# Patient Record
Sex: Female | Born: 1935 | Race: White | Hispanic: No | Smoking: Never smoker
Health system: Southern US, Community
[De-identification: ages and names within clinical notes are randomized; demographics above are authoritative.]

## PROBLEM LIST (undated history)

## (undated) DIAGNOSIS — K219 Gastro-esophageal reflux disease without esophagitis: Secondary | ICD-10-CM

## (undated) DIAGNOSIS — I2510 Atherosclerotic heart disease of native coronary artery without angina pectoris: Secondary | ICD-10-CM

## (undated) DIAGNOSIS — M4800 Spinal stenosis, site unspecified: Secondary | ICD-10-CM

## (undated) DIAGNOSIS — D649 Anemia, unspecified: Secondary | ICD-10-CM

## (undated) DIAGNOSIS — E7800 Pure hypercholesterolemia, unspecified: Secondary | ICD-10-CM

## (undated) DIAGNOSIS — E785 Hyperlipidemia, unspecified: Secondary | ICD-10-CM

## (undated) DIAGNOSIS — M1990 Unspecified osteoarthritis, unspecified site: Secondary | ICD-10-CM

## (undated) DIAGNOSIS — I10 Essential (primary) hypertension: Secondary | ICD-10-CM

## (undated) HISTORY — PX: CAROTID STENT: SHX1301

## (undated) HISTORY — PX: APPENDECTOMY: SHX54

## (undated) HISTORY — PX: ABDOMINAL HYSTERECTOMY: SHX81

## (undated) HISTORY — PX: BACK SURGERY: SHX140

## (undated) HISTORY — PX: UPPER GI ENDOSCOPY: SHX6162

---

## 1998-04-28 ENCOUNTER — Other Ambulatory Visit: Admission: RE | Admit: 1998-04-28 | Discharge: 1998-04-28 | Payer: Self-pay | Admitting: Family Medicine

## 1999-10-20 ENCOUNTER — Encounter: Admission: RE | Admit: 1999-10-20 | Discharge: 1999-11-12 | Payer: Self-pay | Admitting: *Deleted

## 2000-02-15 ENCOUNTER — Encounter: Admission: RE | Admit: 2000-02-15 | Discharge: 2000-02-15 | Payer: Self-pay | Admitting: *Deleted

## 2000-02-15 ENCOUNTER — Encounter: Payer: Self-pay | Admitting: Family Medicine

## 2000-03-22 ENCOUNTER — Ambulatory Visit (HOSPITAL_COMMUNITY): Admission: RE | Admit: 2000-03-22 | Discharge: 2000-03-22 | Payer: Self-pay | Admitting: Neurosurgery

## 2000-03-22 ENCOUNTER — Encounter: Payer: Self-pay | Admitting: Neurosurgery

## 2000-06-17 ENCOUNTER — Ambulatory Visit (HOSPITAL_COMMUNITY): Admission: RE | Admit: 2000-06-17 | Discharge: 2000-06-17 | Payer: Self-pay | Admitting: Cardiology

## 2000-07-01 ENCOUNTER — Encounter: Payer: Self-pay | Admitting: Gastroenterology

## 2000-07-01 ENCOUNTER — Ambulatory Visit (HOSPITAL_COMMUNITY): Admission: RE | Admit: 2000-07-01 | Discharge: 2000-07-01 | Payer: Self-pay | Admitting: Gastroenterology

## 2000-08-11 ENCOUNTER — Other Ambulatory Visit: Admission: RE | Admit: 2000-08-11 | Discharge: 2000-08-11 | Payer: Self-pay | Admitting: Family Medicine

## 2000-08-29 ENCOUNTER — Encounter: Payer: Self-pay | Admitting: Gastroenterology

## 2000-08-29 ENCOUNTER — Encounter: Admission: RE | Admit: 2000-08-29 | Discharge: 2000-08-29 | Payer: Self-pay | Admitting: Gastroenterology

## 2000-09-13 ENCOUNTER — Encounter (HOSPITAL_COMMUNITY): Admission: RE | Admit: 2000-09-13 | Discharge: 2000-12-12 | Payer: Self-pay | Admitting: Cardiology

## 2000-09-22 ENCOUNTER — Encounter: Payer: Self-pay | Admitting: Gastroenterology

## 2000-09-22 ENCOUNTER — Ambulatory Visit (HOSPITAL_COMMUNITY): Admission: RE | Admit: 2000-09-22 | Discharge: 2000-09-22 | Payer: Self-pay | Admitting: Gastroenterology

## 2000-11-04 ENCOUNTER — Encounter: Payer: Self-pay | Admitting: Gastroenterology

## 2000-11-04 ENCOUNTER — Encounter: Admission: RE | Admit: 2000-11-04 | Discharge: 2000-11-04 | Payer: Self-pay | Admitting: Gastroenterology

## 2000-12-13 ENCOUNTER — Encounter (HOSPITAL_COMMUNITY): Admission: RE | Admit: 2000-12-13 | Discharge: 2001-03-13 | Payer: Self-pay | Admitting: Cardiology

## 2001-02-15 ENCOUNTER — Encounter: Payer: Self-pay | Admitting: Family Medicine

## 2001-02-15 ENCOUNTER — Encounter: Admission: RE | Admit: 2001-02-15 | Discharge: 2001-02-15 | Payer: Self-pay | Admitting: Family Medicine

## 2002-02-19 ENCOUNTER — Encounter: Payer: Self-pay | Admitting: Family Medicine

## 2002-02-19 ENCOUNTER — Encounter: Admission: RE | Admit: 2002-02-19 | Discharge: 2002-02-19 | Payer: Self-pay | Admitting: Family Medicine

## 2002-08-24 ENCOUNTER — Encounter: Admission: RE | Admit: 2002-08-24 | Discharge: 2002-08-24 | Payer: Self-pay | Admitting: Family Medicine

## 2002-08-24 ENCOUNTER — Encounter: Payer: Self-pay | Admitting: Family Medicine

## 2002-12-10 ENCOUNTER — Encounter: Admission: RE | Admit: 2002-12-10 | Discharge: 2002-12-10 | Payer: Self-pay | Admitting: Cardiology

## 2002-12-10 ENCOUNTER — Encounter: Payer: Self-pay | Admitting: Cardiology

## 2003-04-05 ENCOUNTER — Encounter: Payer: Self-pay | Admitting: Family Medicine

## 2003-04-05 ENCOUNTER — Encounter: Admission: RE | Admit: 2003-04-05 | Discharge: 2003-04-05 | Payer: Self-pay | Admitting: Family Medicine

## 2003-04-22 ENCOUNTER — Encounter: Admission: RE | Admit: 2003-04-22 | Discharge: 2003-04-22 | Payer: Self-pay | Admitting: Family Medicine

## 2003-04-22 ENCOUNTER — Encounter: Payer: Self-pay | Admitting: Family Medicine

## 2003-07-25 ENCOUNTER — Ambulatory Visit (HOSPITAL_COMMUNITY): Admission: RE | Admit: 2003-07-25 | Discharge: 2003-07-25 | Payer: Self-pay | Admitting: Neurosurgery

## 2003-07-25 ENCOUNTER — Encounter: Payer: Self-pay | Admitting: Neurosurgery

## 2003-08-27 ENCOUNTER — Encounter: Payer: Self-pay | Admitting: Family Medicine

## 2003-08-27 ENCOUNTER — Encounter: Admission: RE | Admit: 2003-08-27 | Discharge: 2003-08-27 | Payer: Self-pay | Admitting: Family Medicine

## 2004-09-09 ENCOUNTER — Encounter: Admission: RE | Admit: 2004-09-09 | Discharge: 2004-09-09 | Payer: Self-pay | Admitting: Family Medicine

## 2005-10-01 ENCOUNTER — Encounter: Admission: RE | Admit: 2005-10-01 | Discharge: 2005-10-01 | Payer: Self-pay | Admitting: Family Medicine

## 2005-10-11 ENCOUNTER — Encounter: Admission: RE | Admit: 2005-10-11 | Discharge: 2005-10-11 | Payer: Self-pay | Admitting: Family Medicine

## 2006-04-18 ENCOUNTER — Encounter: Admission: RE | Admit: 2006-04-18 | Discharge: 2006-04-18 | Payer: Self-pay | Admitting: Family Medicine

## 2006-06-23 ENCOUNTER — Ambulatory Visit (HOSPITAL_COMMUNITY): Admission: RE | Admit: 2006-06-23 | Discharge: 2006-06-23 | Payer: Self-pay | Admitting: Neurosurgery

## 2006-06-29 ENCOUNTER — Ambulatory Visit: Admission: RE | Admit: 2006-06-29 | Discharge: 2006-06-29 | Payer: Self-pay | Admitting: Neurosurgery

## 2006-06-29 ENCOUNTER — Encounter: Payer: Self-pay | Admitting: Vascular Surgery

## 2006-08-24 ENCOUNTER — Ambulatory Visit (HOSPITAL_COMMUNITY): Admission: RE | Admit: 2006-08-24 | Discharge: 2006-08-24 | Payer: Self-pay | Admitting: Family Medicine

## 2006-09-09 ENCOUNTER — Inpatient Hospital Stay (HOSPITAL_COMMUNITY): Admission: EM | Admit: 2006-09-09 | Discharge: 2006-09-14 | Payer: Self-pay | Admitting: Emergency Medicine

## 2006-09-21 ENCOUNTER — Ambulatory Visit (HOSPITAL_COMMUNITY): Admission: RE | Admit: 2006-09-21 | Discharge: 2006-09-21 | Payer: Self-pay | Admitting: Gastroenterology

## 2006-09-29 ENCOUNTER — Encounter (HOSPITAL_COMMUNITY): Admission: RE | Admit: 2006-09-29 | Discharge: 2006-12-28 | Payer: Self-pay | Admitting: Cardiology

## 2006-09-30 ENCOUNTER — Encounter: Admission: RE | Admit: 2006-09-30 | Discharge: 2006-09-30 | Payer: Self-pay | Admitting: Gastroenterology

## 2006-10-14 ENCOUNTER — Encounter: Admission: RE | Admit: 2006-10-14 | Discharge: 2006-10-14 | Payer: Self-pay | Admitting: Family Medicine

## 2006-12-29 ENCOUNTER — Encounter (HOSPITAL_COMMUNITY): Admission: RE | Admit: 2006-12-29 | Discharge: 2007-01-15 | Payer: Self-pay | Admitting: Cardiology

## 2007-01-17 ENCOUNTER — Encounter (HOSPITAL_COMMUNITY): Admission: RE | Admit: 2007-01-17 | Discharge: 2007-04-17 | Payer: Self-pay | Admitting: Cardiology

## 2007-03-27 ENCOUNTER — Ambulatory Visit (HOSPITAL_COMMUNITY): Admission: RE | Admit: 2007-03-27 | Discharge: 2007-03-28 | Payer: Self-pay | Admitting: Cardiology

## 2007-04-26 ENCOUNTER — Observation Stay (HOSPITAL_COMMUNITY): Admission: EM | Admit: 2007-04-26 | Discharge: 2007-04-27 | Payer: Self-pay | Admitting: Emergency Medicine

## 2007-05-30 ENCOUNTER — Encounter: Admission: RE | Admit: 2007-05-30 | Discharge: 2007-05-30 | Payer: Self-pay | Admitting: Family Medicine

## 2007-09-05 ENCOUNTER — Encounter: Admission: RE | Admit: 2007-09-05 | Discharge: 2007-09-05 | Payer: Self-pay | Admitting: Neurosurgery

## 2007-09-12 ENCOUNTER — Inpatient Hospital Stay (HOSPITAL_COMMUNITY): Admission: EM | Admit: 2007-09-12 | Discharge: 2007-09-13 | Payer: Self-pay | Admitting: Emergency Medicine

## 2007-10-16 ENCOUNTER — Encounter: Admission: RE | Admit: 2007-10-16 | Discharge: 2007-10-16 | Payer: Self-pay | Admitting: Family Medicine

## 2008-07-04 ENCOUNTER — Encounter: Admission: RE | Admit: 2008-07-04 | Discharge: 2008-07-04 | Payer: Self-pay | Admitting: Family Medicine

## 2008-07-19 ENCOUNTER — Ambulatory Visit (HOSPITAL_COMMUNITY): Admission: RE | Admit: 2008-07-19 | Discharge: 2008-07-19 | Payer: Self-pay | Admitting: Gastroenterology

## 2008-10-16 ENCOUNTER — Encounter: Admission: RE | Admit: 2008-10-16 | Discharge: 2008-10-16 | Payer: Self-pay | Admitting: Neurosurgery

## 2008-12-09 ENCOUNTER — Encounter: Admission: RE | Admit: 2008-12-09 | Discharge: 2008-12-09 | Payer: Self-pay | Admitting: Family Medicine

## 2009-10-27 ENCOUNTER — Encounter: Admission: RE | Admit: 2009-10-27 | Discharge: 2009-10-27 | Payer: Self-pay | Admitting: Neurosurgery

## 2010-01-21 ENCOUNTER — Encounter
Admission: RE | Admit: 2010-01-21 | Discharge: 2010-01-21 | Payer: Self-pay | Source: Home / Self Care | Admitting: Family Medicine

## 2010-11-30 ENCOUNTER — Ambulatory Visit (HOSPITAL_COMMUNITY)
Admission: RE | Admit: 2010-11-30 | Discharge: 2010-11-30 | Payer: Self-pay | Source: Home / Self Care | Attending: Gastroenterology | Admitting: Gastroenterology

## 2010-12-10 ENCOUNTER — Encounter
Admission: RE | Admit: 2010-12-10 | Discharge: 2010-12-10 | Payer: Self-pay | Source: Home / Self Care | Attending: Gastroenterology | Admitting: Gastroenterology

## 2011-01-14 ENCOUNTER — Other Ambulatory Visit: Payer: Self-pay | Admitting: Family Medicine

## 2011-01-14 DIAGNOSIS — Z1239 Encounter for other screening for malignant neoplasm of breast: Secondary | ICD-10-CM

## 2011-02-08 ENCOUNTER — Ambulatory Visit: Payer: Self-pay

## 2011-02-10 ENCOUNTER — Ambulatory Visit: Payer: Self-pay

## 2011-02-17 ENCOUNTER — Ambulatory Visit
Admission: RE | Admit: 2011-02-17 | Discharge: 2011-02-17 | Disposition: A | Discharge status: Home / Self Care | Payer: Medicare Other | Source: Ambulatory Visit | Attending: Family Medicine | Admitting: Family Medicine

## 2011-02-17 DIAGNOSIS — Z1239 Encounter for other screening for malignant neoplasm of breast: Secondary | ICD-10-CM

## 2011-04-27 NOTE — Cardiovascular Report (Signed)
Molly, [name]          ACCOUNT NO.:  [id number]   MEDICAL RECORD NO.:  [id number]          PATIENT TYPE:  INP   LOCATION:  6529                         FACILITY:  [facility]   PHYSICIAN:  Francisca December, M.D.  DATE OF BIRTH:  1929-08-18   DATE OF PROCEDURE:  09/12/2007  DATE OF DISCHARGE:                            CARDIAC CATHETERIZATION   PROCEDURES PERFORMED:  1. Left heart catheterization.  2. Coronary angiography.  3. Left ventriculogram.   INDICATIONS:  The patient is a 76 year old woman who underwent a stent  implantation in the ostium of the right coronary October 2007.  In April  2008, a relook showed the stent to be widely patent.  She came to  emergency room this morning with classic symptoms of unstable angina  with substernal chest pain radiating up into the jaw.  It was finally  relieved with subcutaneous Lovenox and IV nitroglycerin.  She is brought  to catheterization laboratory at this time to identify the extent of  disease and provide her further therapeutic options.  Goals, risks and  alternatives of the procedure were discussed with the patient.  The  patient states her understanding, has had her questions answered, and  wishes to proceed.   PROCEDURE NOTE:  The patient is brought to cardiac catheterization  laboratory in the fasting state.  The right groin was prepped and draped  in the usual sterile fashion.  Local anesthesia was obtained with  infiltration of 1% lidocaine.  A 6-French catheter sheath was inserted  percutaneously into the right femoral artery utilizing an anterior  approach over a guiding J-wire.  Left heart catheterization and coronary  angiography then proceeded in the standard fashion using 6-French #4  left and right Judkins catheters and a 110 cm pigtail catheter.  A 30  degree RAO cine left ventriculogram was performed utilizing power  injector,  36 mL were injected at 12 mL per second.  All catheter  manipulations were  performed using fluoroscopic observation and  exchanges performed over a long guiding J-wire.  At the completion of  procedure, the catheter and catheter sheaths were removed.  Hemostasis  was achieved.   Hemodynamics: Systemic arterial pressure was 154/64 with a mean of 90  mmHg.  There was no systolic gradient across the aortic valve.  Left  ventricular end-diastolic pressure was 9 mmHg preventriculogram.   ANGIOGRAPHY:  The left ventriculogram demonstrated normal chamber size  and normal global systolic function without regional wall motion  abnormality.  A visual estimate of the ejection fraction is 70%.  There  is no mitral vegetation.  There is a trileaflet aortic valve that opens  normally during systole.  Minimal left coronary calcification is seen  and the stent in the proximal portion of right coronary is easily  visible.   There is a right-dominant coronary system present.  The left main  coronary is normal.   The left anterior descending artery and its branches show some luminal  irregularities but are without any significant obstruction.  A single  large diagonal branch arises which is free of obstruction.  The anterior  descending artery reaches and traverses  the apex.   The left circumflex coronary artery and its branches also are widely  patent.  There is a proximal 20% narrowing after the origin of the ramus  intermedius.  There is no obstruction in the ramus.  Two small marginal  branches arise and the obtuse marginal branch is a dominant vessel.  No  obstructions are seen in these branch arteries.   The right coronary artery is widely patent in the mid and distal  segments as well as the posterior descending artery in the  posterolateral segment and including the left ventricular branch.  The  stent in the proximal portion of the artery is easily visible and  appears to be widely patent.  There is perhaps a 30% degree of in-stent  restenosis.  Again, this is  right at the ostium of the artery.   FINAL IMPRESSION:  1. Atherosclerotic coronary vascular disease, single vessel.  2. Minimal in-stent restenosis ostial right coronary stented segment,      the degree of obstruction not greater than 30%.  3. Intact left ventricular size and global systolic function, ejection      fraction 70%.      Francisca December, M.D.  Electronically Signed     JHE/MEDQ  D:  09/12/2007  T:  09/13/2007  Job:  [id number]   cc:   Hal T. Stoneking, M.D.

## 2011-04-27 NOTE — H&P (Signed)
NAMEJAZMARIE, Molly [name]          ACCOUNT NO.:  [id number]   MEDICAL RECORD NO.:  [id number]          PATIENT TYPE:  OBV   LOCATION:  0102                         FACILITY:  [facility]   PHYSICIAN:  Corinna L. Lendell Caprice, MDDATE OF BIRTH:  08-28-1929   DATE OF ADMISSION:  04/26/2007  DATE OF DISCHARGE:                              HISTORY & PHYSICAL   CHIEF COMPLAINT:  Fall.   HISTORY OF PRESENT ILLNESS:  Molly [name] is a pleasant 77 year old  white female patient of Dr. Consuella Lose Griffin's who was brought to the  emergency room by family members after having fallen and hitting her  left face.  She does not recall the episode.  It was approximately 5:00  a.m.; it was dark; and she reportedly was getting out of bed and  subsequently hit her head on a night stand or chest of drawers.  She  also was noted to have a right wrist hematoma and ecchymoses over her  left flank.  Her husband heard a thump, and evaluated her you  immediately.  He reports that she was alert, but that she seemed a  little bit confused.  This quickly resolved.  She had a lot of bleeding  from the left brow area; and it has been sutured by the ED staff.  She  had a CT of the brain which was negative.  She also had IV morphine.  After getting the morphine she experienced a choking sensation and chest  pain which resolved quickly.  She subsequently got Benadryl, but the  sensation had resolved prior to receiving the Benadryl.  She had  sensation of the bed and the room tilting as well as dizziness and  difficulty walking after receiving morphine.  The ED physician was  concerned about a central neurologic event; and I was, therefore,  consulted to evaluate for admission.  The patient reports no history of  stroke or seizure her husband reports that her mental status is back to  baseline.  The patient reports some pain around the left brow.  There  was no reported facial droop, dysarthria, etcetera.   PAST MEDICAL  HISTORY:  1. Coronary artery disease with history of bare metal stent  of the      right coronary in October of 2007.  2. History of anemia.  3. History of gastroesophageal reflux disease with esophagitis.  4. Hyperlipidemia.  5. History of appendectomy.  6. History of partial hysterectomy.  7. room History of chronic low back pain and disk degenerative disk      disease.   MEDICATIONS:  1. Aspirin 325 mg a day.  2. Calcium 500 mg a day.  3. Stool softener and laxative daily.  4. Crestor 10 mg a day.  5. Nexium 40 mg a day.  6. Omnicor 1 gram p.o. b.i.d.  7. Voltaren 75 mg a day.  8. Zetia 10 mg a day.  9. Imdur  30 mg a day.  10.Glucosamine daily.   ALLERGIES:  She is allergic to CODEINE and SULFA.   SOCIAL HISTORY:  She is married, here with her husband and daughter.  She has no drinking  of smoking history.   FAMILY HISTORY:  Her brother died of a heart attack at age 56.  Her  sisters also had coronary artery disease, stroke and diabetes.  Her  mother had aplastic anemia.   PAST SURGICAL HISTORY:  As above.   REVIEW OF SYSTEMS:  As above, otherwise negative.   PHYSICAL EXAMINATION:  VITAL SIGNS:  Temperature is 97.7, blood pressure  150/91, pulse 76, respiratory rate 20, oxygen saturation 100%.  GENERAL:  The patient is well-nourished, well-developed in no acute  distress.  HEENT: She has a laceration over her left brow which is approximately  1.5 cm and has been sutured.  There is a hematoma and ecchymoses  surrounding that area.  Pupils equal, round, reactive to light.  Sclerae  nonicteric.  No nystagmus.  Tympanic membranes are without any blood or  erythema.  She has moist mucous membranes.  Upper dentures.  Oropharynx  is clear.  NECK:  Supple.  No lymphadenopathy.  No JVD.  LUNGS:  Clear to auscultation bilaterally without wheezes, rhonchi or  rales.  CARDIOVASCULAR:  Regular rate and rhythm without murmurs, gallops or  rubs.  ABDOMEN:  Normal bowel sounds,  soft, nontender, nondistended.  GENITOURINARY AND RECTAL:  Deferred.  EXTREMITIES:  No clubbing, cyanosis or edema.  She does have a small  skin tear and a hematoma over her right wrist, but has full range of  motion of that wrist.  She has a full range of motion of all of their  joints.  BACK:  She has ecchymoses of her left flank. No point tenderness along  the spine.  NEUROLOGIC:  Alert and oriented.  Cranial nerves are intact.  Motor strength 5/5.  Sensation is intact to light touch and pinprick.  Finger-to-nose is normal.  Romberg negative.  Her gait is somewhat  ataxic deep tendon reflexes 0 on right patellar tendon, 2+ on the left.  Babinski's negative.  SKIN:  No rash.  PSYCHIATRIC:  Alert and oriented.  Normal affect.   LABS:  CBC normal.  BMET normal.  Cardiac enzymes negative.  UA  negative.  EKG shows normal sinus rhythm and age indeterminate septal  infarct.  CT of the brain shows no acute infarct.  A CT of the C-spine  shows degenerative disk disease and DJD with 2 mm of subluxation at C5-6  due to DJD, nothing acute.  MRI of the brain shows nothing acute,  atrophy, and chronic microvascular white matter disease.   ASSESSMENT AND PLAN:  1. Ataxia.  The husband reports that this occurred after morphine; and      I suspect that this is a medication effect.  Her neuro exam is      nonfocal, other than her unsteady gait.  Also, her MRI of the brain      is negative.  As she is somewhat unsteady, and lives with her      elderly husband.  I will place her on 23-hour observation for      neurologic checks and physical therapy if needed.  Hopefully, as      the morphine effect wears off her symptoms will resolve.  2. Status post fall.  I will place the patient on telemetry, but it is      uncertain whether this was a true syncopal episode or whether the      patient tripped etcetera, as it was dark in her room.  3. Coronary artery disease. 4. Chronic low back pain.  5.  Hyperlipidemia.  6. Gastroesophageal reflux disease.  7. Hyperlipidemia.      Corinna L. Lendell Caprice, MD  Electronically Signed     CLS/MEDQ  D:  04/26/2007  T:  04/26/2007  Job:  [id number]   cc:   Gretta Arab. Valentina Lucks, M.D.  Francisca December, M.D.  Tasia Catchings, M.D.

## 2011-04-30 NOTE — H&P (Signed)
NAMEMILDRETH, REEK NO.:  [id number]   MEDICAL RECORD NO.:  [id number]          PATIENT TYPE:  INP   LOCATION:  6526                         FACILITY:  [facility]   PHYSICIAN:  Ulyses Amor, MD DATE OF BIRTH:  [birth year]   DATE OF ADMISSION:  09/09/2006  DATE OF DISCHARGE:                                HISTORY & PHYSICAL   Molly [name] is a 77 year old white woman who is admitted to [facility] for further evaluation of chest pain.   The patient, who has no past history of cardiac disease, was awakened this  evening with chest pain. The pain was described as a pressure discomfort in  the center of her chest. It radiated to her left arm. It was associated with  nausea and diaphoresis but no dyspnea. There were no exacerbating or  ameliorating factors. It appeared not to be related to position, activity,  meals, or respirations. It continued until she arrived in the emergency  department and was given nitroglycerin. The total duration of chest pain was  approximately 30 minutes.   As noted, the patient has not past history of cardiac disease, including no  history of chest pain, myocardial infarction, coronary artery disease,  congestive heart failure or arrhythmia. A cardiac catheterization in 2001  according to her recollection, demonstrated no coronary artery disease.   The patient has a number of risk factors for coronary artery disease  including hypertension, dyslipidemia, and family history. The patient has no  history of diabetes mellitus or smoking.   The patient's only other medical problem is gastroesophageal reflux.   MEDICATIONS:  Aspirin, Colace, Crestor, Nexium, Omacor, verapamil and Zetia.   ALLERGIES:  CODEINE.   OPERATION:  Appendectomy.   SIGNIFICANT INJURIES:  None.   SOCIAL HISTORY:  The patient lives with her husband. She neither smokes  cigarettes nor drinks alcohol.   FAMILY HISTORY:  Notable for coronary  artery disease.   REVIEW OF SYSTEMS:  Reveals no problems related to her head, eyes, ears,  nose, mouth, throat, lungs, gastrointestinal system, genitourinary system,  or extremities. There is no history of neurologic or psychiatric disorder.  There is no history of fever, chills or weight loss.   PHYSICAL EXAMINATION:  VITAL SIGNS:  Blood pressure 123/63. Pulse 84 and  regular. Respirations 18. Temperature 97.9. Pulse ox is 98% on room air.  GENERAL:  The patient was an elderly white woman in no discomfort. She was  alert, oriented, appropriate, and responsive.  HEENT:  Head, eyes, nose and mouth were normal.  NECK:  Without thyromegaly or adenopathy. Carotid pulses were palpable  bilaterally and without bruits.  CARDIAC:  Reveals a normal S1 and S2. There was no S3, S4, murmur, rub, or  click. Cardiac rhythm was regular. No chest wall tenderness was noted.  LUNGS:  Clear.  ABDOMEN:  Soft and nontender. There was no mass, hepatosplenomegaly, bruit,  distention, rebound, guarding or rigidity. Bowel sounds were normal.  BREASTS/PELVIC/RECTAL:  Not performed as they were not pertinent to the  reason for acute care hospitalization.  EXTREMITIES:  Without edema, deviation or deformity. Radial and dorsalis  pedis pulses were palpable bilaterally.  NEUROLOGIC:  Brief screening neurologic survey was unremarkable.   The electrocardiogram was normal, so the possibility of a prior anterior  myocardial infarction could not be excluded based on the lack of R wave  progression from between V2 and V3, though this could be due to lead  position. The chest radiograph, according to the radiologist, demonstrated  no evidence of acute cardiopulmonary disease. The initial set of cardiac  markers reveals a myoglobin of 41.4, CK-MB less than 1.0 and troponin less  than 0.05. Potassium is 4.3, BUN 21 and creatinine 1.3. The remaining  studies were pending at the time of this dictation.   IMPRESSION:  1.  Chest pain; rule out unstable angina. No coronary artery disease by      cardiac catheterization in 2001 (according to the patient).  2. Hypertension.  3. Dyslipidemia.  4. Gastroesophageal reflux.   PLAN:  1. Telemetry.  2. Serial cardiac enzymes.  3. Aspirin.  4. Intravenous heparin.  5. Intravenous nitroglycerin.  6. Further measures per Dr. Amil Amen.      Ulyses Amor, MD  Electronically Signed     MSC/MEDQ  D:  09/09/2006  T:  09/09/2006  Job:  [id number]   cc:   Francisca December, M.D.

## 2011-04-30 NOTE — Cardiovascular Report (Signed)
[name], [name]          ACCOUNT NO.:  [id number]   MEDICAL RECORD NO.:  [id number]          PATIENT TYPE:  OIB   LOCATION:  6529                         FACILITY:  [facility]   PHYSICIAN:  Francisca December, M.D.  DATE OF BIRTH:  [birth year]   DATE OF PROCEDURE:  03/27/2007  DATE OF DISCHARGE:                            CARDIAC CATHETERIZATION   PROCEDURES PERFORMED:  1. Left heart catheterization.  2. Left ventriculogram.  3. Coronary angiography.  4. Intravascular ultrasound of right coronary.   INDICATIONS:  Molly [name] is a 77 year old woman who is now 6  months S/P bare-metal stent implantation ostial right coronary artery  for unstable angina and abnormal Cardiolite.  She has redeveloped an  atypical anginal syndrome.  She does have her usual angina but it is not  exertional.  Initially, she was asymptomatic after the stent placement  and then after 2 to 3 months she began to have symptoms again.  However,  they again were not exertional.  A myocardial perfusion study with  adenosine pharmacologic stress failed to show any significant perfusion  deficit.  However, because of her persistent symptoms, she is brought to  the catheterization laboratory to identify possible progressive disease  at this time.   PROCEDURE NOTE:  The patient is brought to cardiac catheterization  laboratory in a fasting state.  The right groin was prepped and draped  in the usual sterile fashion.  Local anesthesia was obtained with  infiltration of 1% lidocaine.  A 5-French catheter sheath was inserted  percutaneously in the right femoral artery utilizing an anterior  approach over a guiding J-wire.  Left heart catheterization and coronary  angiography then proceeded in the standard fashion using 6-French #4  left and right Judkins catheters and a 110-cm pigtail catheter.  A 30  degree RAO cine left ventriculogram was performed utilizing a power  injector.  At the completion of the  angiographic portion of the  procedure, I proceeded with intravascular ultrasound.  The 5-French  catheter sheath was exchanged for 6-French catheter sheath over a long  guiding J-wire.  The patient received 3300 units of heparin.  A 6-French  #4 right Judkins guiding catheter was advanced to the ascending aorta  where the right coronary os was engaged.  A 0.014 inch Luge  intracoronary guidewire was passed across the lesion without difficulty  which was a mild to moderate stenosis in the ostium of the right  coronary.  A manual pullback image of the stent and the stented ostium  was performed with a Scimed Atlantis catheter.  Following this, the  guidewire and guiding catheter were removed.  A right femoral  arteriogram in the 45 degrees RAO angulation via the catheter sheath by  hand injection documented adequate anatomy for the percutaneous closure  device Angio-Seal.  This was subsequent deployed with good hemostasis  and an intact distal pulse.   HEMODYNAMIC RESULTS:  Systemic arterial pressure was 164/77 with a mean  of 115 mmHg.  There was no systolic gradient across the aortic valve.  The left ventricular end-diastolic pressure was 15 mmHg pre-  ventriculogram.   Angiography:  The  left ventriculogram demonstrated normal chamber size  and normal global systolic function without regional wall motion  abnormality.  The stent in the right coronary was easily visible.  A  visual estimate of the ejection fraction is 65%.  The aortic valve is  trileaflet and opens normally during systole.  There is no mitral  regurgitation.   There is a right-dominant coronary system present.  The main left  coronary was normal.   The left anterior descending artery and its branches were normal.   The left circumflex coronary artery and its branches were normal.   The right coronary artery and its branches showed a 30% to 50% degree of  restenosis with some haziness in the ostium of the right  coronary.  The  remainder of the right coronary was widely patent.   INTRAVASCULAR ULTRASOUND:  This revealed a patent, but in a nonuniform  fashion, ostium of the right coronary.  Where as the more distal portion  of the right coronary which was stent was perfectly round in nature and  had an area of 15.2 mm2.  The ostium showed a 4.1 x 2.8 mm lumen with an  area of 9.9 mm2.   FINAL IMPRESSION:  1. Atherosclerotic cardiovascular disease, single vessel.  2. Intact left ventricular size and global systolic function.  3. Adequately patent ostium of the right coronary.   PLAN/RECOMMENDATIONS:  I will initiate medical therapy for the patient's  chest discomfort.  Initially this will amount to long-acting  nitroglycerin.  She may have a component of microvascular angina.      Francisca December, M.D.  Electronically Signed     JHE/MEDQ  D:  03/27/2007  T:  03/28/2007  Job:  [id number]   cc:   Gretta Arab. Valentina Lucks, M.D.

## 2011-04-30 NOTE — Cardiovascular Report (Signed)
Molly [name], Molly [name]          ACCOUNT NO.:  [id number]   MEDICAL RECORD NO.:  [id number]          PATIENT TYPE:  INP   LOCATION:  2024                         FACILITY:  [facility]   PHYSICIAN:  Francisca December, M.D.  DATE OF BIRTH:  Sep 02, 1929   DATE OF PROCEDURE:  09/12/2006  DATE OF DISCHARGE:                              CARDIAC CATHETERIZATION   PROCEDURES PERFORMED:  1. Left heart catheterization.  2. Coronary angiography.  3. Left ventriculogram.   CARDIOLOGIST:  Francisca December, M.D.   INDICATIONS:  [name] is a 77 year old woman who was admitted  three days ago after prolonged chest pain which was quite anginal in nature.  She ruled out for a myocardial infarction.  A myocardial perfusion study  showed a reversible inferior defect.  She is brought to the catheterization  laboratory at this time to identify the extent of disease and provide for  further therapeutic options.   Complicating her clinical picture is progressive anemia.  She had undergone  a colonoscopy,  with no source found.  She has had a significant drop in  hemoglobin previously and received 2 units of packed red blood cells.  Over  the past two days while on heparin and nitroglycerin, she had a 1.5 gram  drop in hemoglobin to 8.2 from 9.4 grams.  She received 2 units of packed  red blood cells over the weekend and her hemoglobin as of today is 11.   PROCEDURE NOTE:  The patient is brought to the cardiac catheterization  laboratory in the fasting state.  The right groin was prepped and draped in  the usual sterile fashion.  Local anesthesia was obtained with the  infiltration of 1% lidocaine.  A 6-French catheter sheath was inserted  percutaneously into the right femoral artery utilizing an anterior approach  over a guiding J-wire.  The left heart catheterization and coronary  angiography then proceeded in the standard fashion using a 6-French #4 left  and right Judkins catheters and a  110 cm pigtail catheter.  The pigtail  catheter which was used to perform left a ventriculography in a 30-degree  RAO angulation, 39 mL were injected at 13 mL per second.  All catheter  manipulations were performed using fluoroscopic observation and exchanges  performed over a long guiding J-wire.  At the completion of procedure the  catheter and catheter sheath was removed.  Hemostasis was achieved by direct  pressure.   The patient was transported to the recovery area in stable condition with an  intact distal pulse.   HEMODYNAMIC RESULTS:  Systemic arterial pressure was minimally elevated  154/72 with a mean of 106 mmHg.  There is no systolic gradient across the  aortic valve.  The left ventricular end-diastolic pressure was 14 mmHg pre-  ventriculogram.   ANGIOGRAPHY:  The left ventriculogram demonstrated normal chamber size and  normal global systolic function without regional wall motion abnormality.  There is no mitral regurgitation.  There was left coronary calcification  seen.  The aortic valve is trileaflet and opens normally during systole.  A  visual estimated ejection fraction is 65-70%.  There is  a right dominant  coronary system present.  1. LEFT MAIN CORONARY ARTERY:  The main left coronary was normal.  2. LEFT ANTERIOR DESCENDING CORONARY ARTERY:  The left anterior descending      artery and its branches were without significant obstruction.  There is      a 10-20% narrowing at the site of the left coronary calcification      mentioned previously.  It is in the mid- LAD.  There is a large      diagonal branch that arises just distal to this and it is widely      patent.  The ongoing anterior descending reaches and barely traverses      the apex.  3. LEFT CIRCUMFLEX CORONARY ARTERY:  The left circumflex coronary and its      branches are without significant obstruction.  There is a very proximal      moderate to large-sized first marginal branch and then small second  and      third marginal branches.  The fourth marginal branch is on the obtuse      margin and is widely patent, as is the left circumflex itself.  In the      LAO caudal view there is a 10% focal narrowing in the proximal      circumflex.  This is the only view in which this is seen of 10-20%.  4. RIGHT CORONARY ARTERY:  The right coronary and its branches are highly      diseased.  The vessel has an ostial stenosis of 70-80%.  There is then      no significant disease in the remainder of the vessel.  It is widely      patent down to the distal segment where it gives rise to a moderate-      sized posterior descending artery.  The posterior lateral segment is      moderate in size as well and gives rise to two small left ventricular      branches.  There are no significant obstructions in these distal      vessels.  Collateral vessels are not seen.   FINAL IMPRESSION:  1. Atherosclerotic cardiovascular disease, single vessel ostial right      coronary artery.  2. Intact ventricular size and global systolic function.   PLAN/RECOMMENDATION:  Following the angiography the case was discussed with  Dr. Tasia Catchings the patient's gastroenterologist.  He feels that her  anemia is not likely to be GI in origin and she has had 4 unit transfusion  to maintain a hemoglobin of 11, and yet has not had a guaiac-positive stool  or melena.  He recommends proceeding with intervention on the right coronary  and if a GI disorder is uncovered by the use of aspirin and Plavix, then  they can proceed to treat that diagnosis.   Therefore, I will have the patient return to the catheterization laboratory  tomorrow after  Plavix loading and aspirin and proceed with a percutaneous  intervention of the ostium of the right coronary.  It will require a cutting  balloon dilatation and then a short stent implantation.      Francisca December, M.D.  Electronically Signed    JHE/MEDQ  D:  09/12/2006  T:   09/13/2006  Job:  [id number]   cc:   Gretta Arab. Valentina Lucks, M.D.  Tasia Catchings, M.D.

## 2011-04-30 NOTE — Cardiovascular Report (Signed)
[place]. [facility]  Patient:    Molly [name], Molly [name]                 MRN: [id number] Proc. Date: 06/17/00 Adm. Date:  [id number] Disc. Date: [id number] Attending:  Norman Clay CC:         Gretta Arab Valentina Lucks, M.D.                        Cardiac Catheterization  HISTORY:  This 77 year old female had a prior history of chest discomfort but with no significant coronary artery disease identified previously.  She has had worsening symptoms with continued chest discomfort and procedure was done to exclude significant coronary artery disease as a cause.  COMMENTS ABOUT PROCEDURE:  The patient tolerated the procedure well and had good hemostasis and pedal pulses following the end of the procedure.  HEMODYNAMIC DATA:  Aorta post contrast 134/75.  LV post contrast 134/17-20.  ANGIOGRAPHIC DATA:  Left ventriculogram performed in the 30-degree RAO projection:  The aortic valve was normal.  The mitral valve was normal.  The left ventricle appears normal in size.  The ejection fraction is estimated at 60%.  Coronary arteries arise and distribute normally.  There is no significant calcification noted. 1. The left main coronary artery appears smooth and does not have any    significant stenoses.  There is some minor irregularity and dilatation in    the proximal LAD.  No severe focal obstructive lesions are noted.  A    moderate sized intermedius branch arises with no significant focal    narrowings. 2. The circumflex contains no significant disease. 3. The right coronary artery appears to have a mild to moderate ostial    narrowing that is not associated with catheter damping or    ventricularization.  It is estimated to be no greater than 30-40%.  The    remaining vessel appears to be smooth.  IMPRESSION: 1. Minor coronary irregularities, mild to moderate ostial right coronary    artery stenosis. 2. Normal left ventricular  function.  RECOMMENDATIONS:  The patient does have a clinical diagnosis of angina but does not have severe obstructive coronary artery disease.  It is recommended that she lose weight and be involved in regular exercise and continued medical treatment.DD:  06/17/00 TD:  06/17/00 Job: [id number]

## 2011-04-30 NOTE — Discharge Summary (Signed)
Molly [name], Molly [name]          ACCOUNT NO.:  [id number]   MEDICAL RECORD NO.:  [id number]          PATIENT TYPE:  INP   LOCATION:  6527                         FACILITY:  [facility]   PHYSICIAN:  Francisca December, M.D.  DATE OF BIRTH:  1928/12/28   DATE OF ADMISSION:  09/09/2006  DATE OF DISCHARGE:  09/14/2006                                 DISCHARGE SUMMARY   DISCHARGE DIAGNOSES:  1. Chest pain, status post bare metal stent to the right coronary artery      on September 13, 2006.  2. Anemia, status post transfusion.  3. Diverticulosis.  4. Distal esophagitis.  5. Hyperlipidemia, treated.  6. Gastroesophageal reflux disease.  7. Long-term medication use.  8. Status post remote appendectomy.  9. ALLERGY TO CODEINE.   HISTORY OF PRESENT ILLNESS:  [name] is a 77 year old female who was  admitted to [facility] on September 01, 2006, with chest pain.  Ultimately, she had negative CK-MB with trace positive troponins.  She  underwent an adenosine Cardiolite that showed visible ischemia in the  inferior wall with an EF of 30%.  She then underwent a cardiac  catheterization on September 12, 2006, that showed the following:  RCA with an  80% eccentric ostial lesion with no significant disease in the circumflex  and LAD systems.   She was also noted during this time period to have some anemia, and this  required a consultation with Dr. Tasia Catchings.  She underwent endoscopy  and colonoscopy which showed left-sided diverticulosis, melanosis coli, as  well as minimal distal esophagitis with early stricture, and she was dilated  to 15 mm.  Of note, the patient was transfused 1 unit of packed red blood  cells.  Dr. Sherin Quarry recommended to proceed with stent placement, and the  anemia could be worked up as an outpatient.   The patient underwent bare-metal stent placement to the ostial RCA on  September 13, 2006, under the efforts of Dr. Corliss Marcus.  The patient  tolerated the  procedure well, and the lesion was reduced from 80% down to a  0% lesion postprocedure.  The patient was Angio-Sealed.  Remained in the  hospital overnight and remained in stable condition.   LABORATORY STUDIES ON DISCHARGE:  BUN of 14, creatinine 1.0, potassium 4.4.  Hemoglobin of 10.6, hematocrit 32.8, platelets 329, white count of 5.7.  Patient was discharged home in stable condition.   The patient's discharge medications include:  1. Enteric-coated aspirin 325 mg a day.  2. Zetia 10 mg a day.  3. Nexium daily.  4. Verapamil 180 mg a day.  5. __________  as needed.  6. [place] as before.  7. Plavix 75 mg a day.  8. Nu-Iron twice a day.  9. Sublingual nitroglycerin p.r.n. chest pain.  10.Calcium as prior to admission.  11.I have asked her to hold her Voltaren at this time secondary to the GI      workup.   She is to return to see Dr. Amil Amen in 2-3 weeks.  She needs to make this  appointment and also an appointment to see Dr. Sherin Quarry in  2 weeks.   She is to call for any recurrent chest pain.  She may drive in 2 days.  No  lifting over 10 pounds for 1 week.  She is to clean the incision site soap  and water.  She is to increase her activity slowly.  She may shower, bathe,  and walk up steps.      Guy Franco, P.A.      Francisca December, M.D.  Electronically Signed    LB/MEDQ  D:  09/14/2006  T:  09/14/2006  Job:  [id number]   cc:   Tasia Catchings, M.D.  Gretta Arab Valentina Lucks, M.D.

## 2011-04-30 NOTE — Cardiovascular Report (Signed)
Molly [name], Molly [name]          ACCOUNT NO.:  [id number]   MEDICAL RECORD NO.:  [id number]          PATIENT TYPE:  INP   LOCATION:  6527                         FACILITY:  [facility]   PHYSICIAN:  Francisca December, M.D.  DATE OF BIRTH:  October 08, 1929   DATE OF PROCEDURE:  09/13/2006  DATE OF DISCHARGE:  09/14/2006                              CARDIAC CATHETERIZATION   PROCEDURE PERFORMED:  PCI slash bare metal stent implantation ostial right  coronary.   INDICATIONS:  A 77 year old woman who has presented with unstable angina  pectoris.  A myocardial perfusion study documented reversible inferior  defect.  Coronary angiography performed, yesterday, showed a 75-80% stenosis  of the ostium in the right coronary which was progressive disease from 1999.  She is to undergo catheter-based revascularization at this time.   She has been pretreated with 300 mg of clopidogrel.   PROCEDURAL NOTE:  The patient is brought to the cardiac catheterization  laboratory in a fasting state.  The right groin was prepped and draped in  the usual sterile fashion.  Local anesthesia was obtained with the  infiltration of 1% lidocaine.  A 6-French catheter sheath was inserted  percutaneously into the right femoral artery utilizing an anterior approach  over a guiding J-wire.  The patient then received 1.75 mg/kg bolus of  bivalirudin followed by a 0.75 mg/kg per hour constant infusion.  The  resultant ACT was 322 seconds.  A 6-French #4 right Judkins guiding catheter  with side holes was advanced to the ascending aorta where the right coronary  os was engaged.  A 0.014-inch Luge intracoronary guidewire was passed across  the ostial lesion without difficulty.   Initial treatment was with a 4.0 x 10 mm Scimed cutting balloon.  This was  inflated to a peak pressure of 8 atmospheres on two occasions for as long as  2 minutes.  This was the balloon was deflated and removed and a 4.0 x 12 mm  Scimed Liberte was  carefully positioned.  Multiple images with and without  contrast were obtained to position the stent such that it adequately covered  the ostium, but was not protruding excessively.  It was finally deployed at  peak pressure of 18 atmospheres, 3 separate inflations were performed.  It  did appear to have excellent positioning when complete. This was confirmed  by angiography and by fluoroscopy.  The guiding catheter and guidewire were  then removed.   Hemostasis was achieved by use of the percutaneous closure device AngioSeal.  This was after a right femoral arteriogram in the 45-degree RAO angulation  documenting adequate anatomy for placement of the same.  She was then  transported to the recovery area in stable condition with an intact distal  pulse.   Angiography, as mentioned, the lesion treated was in the ostium of the right  coronary.  There was some calcification.  Following balloon dilatation and  stent implantation, there was really no residual stenosis.  There was a good  step-down at the distal end of stent.  There did not appear to be excessive  protrusion of the stent struts into the aorta.  FINAL IMPRESSION:  1. Atherosclerotic cardiovascular disease, single vessel.  2. Status post successful bare metal stent implantation ostium in proximal      right coronary.  3. Typical angina was reproduced with device insertion and balloon      inflation.      Francisca December, M.D.  Electronically Signed     JHE/MEDQ  D:  09/13/2006  T:  09/14/2006  Job:  [id number]   cc:   Tasia Catchings, M.D.  Gretta Arab Valentina Lucks, M.D.

## 2011-04-30 NOTE — Discharge Summary (Signed)
Molly [name], Molly [name]          ACCOUNT NO.:  [id number]   MEDICAL RECORD NO.:  [id number]          PATIENT TYPE:  OBV   LOCATION:  1401                         [facility]AC[facility]   PHYSICIAN:  Corinna L. Lendell Caprice, MDDATE OF BIRTH:  September 09, 1929   DATE OF ADMISSION:  04/26/2007  DATE OF DISCHARGE:  04/27/2007                               DISCHARGE SUMMARY   DISCHARGE DIAGNOSES:  1. Fall resulting in hematoma of the forehead.  2. Ataxia, morphine-related.  3. Coronary artery disease.  4. Chronic low back pain.  5. Hyperlipidemia.  6. Gastroesophageal reflux disease.   DISCHARGE MEDICATIONS:  Discharge medications are the same as upon  admission, please see history and physical, with the addition of Vicodin  as needed 1-2 p.o. every four hours p.r.n.   CONDITION ON DISCHARGE:  Stable.   ACTIVITY:  No heavy exertion.   FOLLOW UP:  Follow up with Dr. Maurice Small as needed.   DISCHARGE DIET:  Cardiac.   CONSULTATIONS:  None.   PROCEDURE:  None.   PERTINENT LABORATORY DATA:  CBC unremarkable.  Basic metabolic panel  unremarkable.  Two sets of cardiac enzymes negative.  Urinalysis  negative.   RADIOLOGY:  CT of the brain showed scalp laceration and hematoma and no  acute intracranial abnormality.  CT of the cervical spine showed no  acute abnormality, degenerative disk and joint disease involving C5-6  and C6-7 with a 2 mm spondylolisthesis of C5 on C6 due to fairly severe  facet joint arthritis bilaterally.  Also fairly severe facet joint  disease from C3-4 through C7-T1 bilaterally.  MRI of the brain showed  nothing acute, atrophy without hydrocephalous, mild small vessel type  changes, left frontal subcutaneous hematoma.  EKG shows normal sinus  rhythm and age indeterminate septal infarct.   HISTORY AND HOSPITAL COURSE:  Please see H&P for complete admission  details.  Briefly, [name] is a 77 year old white female patient of  Dr. Valentina Lucks who fell and hit her  face on the floor in the middle of the  night.  It is unclear whether she slipped or had a syncopal episode.  Her husband did hear a thump and evaluated her immediately.  She was  alert but somewhat confused.  She had a scalp laceration that was  sutured by the ED staff over the left brow area.  She was given morphine  and had a choking sensation and chest pain which resolved quickly.  She  also got Benadryl but the symptoms had resolved prior to receiving  Benadryl.  When she walked, the ED staff was concerned about ataxia and  possible neurologic event.  She also felt that the room was tilting.  When I examined the patient in the emergency room she had unremarkable  vital signs and she had a laceration over the left brow which was  sutured.  It was approximately 1.5 cm and there were hematoma around the  area.  Otherwise she had an unremarkable exam including neurologic exam.  The patient was placed on 23-hour observation.  Her symptoms resolved  and at the time of discharge she was able to ambulate without  difficulty.  She was feeling much better.  I suspect her symptoms were  secondary to morphine.           ______________________________  Cammie Mcgee L. Lendell Caprice, MD     CLS/MEDQ  D:  05/09/2007  T:  05/10/2007  Job:  [id number]   cc:   Amil Amen, M.D.

## 2011-04-30 NOTE — H&P (Signed)
[place]. [facility]  Patient:    Molly [name], Molly [name]                 MRN: [id number] Adm. Date:  [id number] Disc. Date: [id number] Attending:  Barton Fanny CC:         Gretta Arab. Valentina Lucks, M.D.                         History and Physical  HISTORY:  The patient is a 77 year old female brought in for cardiac catheterization. She began to have tightness on her chest and had a nondiagnostic treadmill test in 1997.  She was treated with atenolol and later Cardizem which she said resulted in foggy thinking.  She continued to have chest tightness and had catheterization done on June 01, 1996, showing normal LV function and no significant coronary artery disease noted.  Since that time, the patient has complained of exercise intolerance and some dyspnea and chest pain.  She had remarried about two years ago and has gain excessive amounts of weight.  Dr. Valentina Lucks had seen her recently where she noted worsening shortness of breath, tightness, heaviness, with pain going to her arm.  Her husband, who is with her today, reports that she has had significantly worsened chest pain and cannot walk up hills and has dyspnea and chest pain when she walks up hills or does other things.  She was thought to possibly have had some angina previously.  She had an adenosine Cardiolite scan which showed significant ST depression with adenosine and significant symptoms.  She had significant anterior breast attenuation but no definite ischemia that we could see.  EF was normal.  The test was felt to be somewhat indeterminate.  It was recommended that she be admitted to the hospital for catheterization to exclude recurrent coronary artery disease as the cause of her pain prior to entering in an exercise program.  PAST MEDICAL HISTORY:  Negative for hypertension, diabetes, or ulcers.  She has dyslipidemia and is currently on Lipitor.  PAST SURGICAL HISTORY:  Appendectomy, rectal  fissure, and hammertoe surgery.  ALLERGIES:  CODEINE, SULFA, and skin rash to BETADINE.  CURRENT MEDICATIONS:  Premarin, Voltaren, Prilosec, Lipitor, and aspirin.  FAMILY HISTORY:  Remarkable for premature cardiac disease.  Father died of MI at age 45.  Sister died of stroke, heart disease, and diabetes.  Significant heart disease in the family.  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  She as remarried two years ago to MR. Fogelman.  REVIEW OF SYSTEMS:  No TIAs or claudication.  Significant arthritis involving her hips.  Episodic edema, episodic GI symptoms.  Significant pinched nerve in her neck.  Significant weight gain of 18 pounds since 1997.  The remainder of the Review of Systems is unremarkable except as noted above.  PHYSICAL EXAMINATION:  GENERAL:  An obese woman weighing 192 pounds.  SKIN:  Warm and dry.  VITAL SIGNS:  Blood pressure 120/76 sitting, 118/78 standing.  Pulse is 80.  HEENT:  EOMI.  PERRLA.  C&S clear.  Fundi unremarkable.  Pharynx negative.  NECK:  Supple without masses, thyromegaly, or carotid bruits.  LUNGS:  Clear.  CARDIAC:  Normal S1, S2.  No S3 or murmur.  ABDOMEN:  Obese, soft, and nontender.  EXTREMITIES:  Femoral pulses are deep.  Pedal pulses 2+.  No edema noted.  LABORATORY DATA:  A 12-lead ECG normal.  IMPRESSION: 1. Chest discomfort with  features suggestive of angina.  Catheterization    unremarkable four years ago, positive family history.  Cardiolite scan    showed no ischemia but still indeterminate with ongoing symptoms despite    significant intolerance to medicines in the past. 2. Dyslipidemia under treatment. 3. Morbid obesity.  RECOMMENDATIONS:  A very difficult clinical situation.  I recommend that she have a repeat catheterization to exclude coronary disease as the cause of her progressive symptoms.  If catheterization shows no significant disease, would really push hard on exercise and weight loss.   Catheterization was discussed with her and her husband fully including risk of MI, death, or CVA, and they are willing to proceed. DD:  06/13/00 TD:  06/13/00 Job: [id number]

## 2011-09-23 LAB — COMPREHENSIVE METABOLIC PANEL
ALT: 13
Alkaline Phosphatase: 53
CO2: 25
Calcium: 9.7
GFR calc Af Amer: >60
Potassium: 3.7
Sodium: 140
Total Protein: 6.7

## 2011-09-23 LAB — I-STAT 8, (EC8 V) (CONVERTED LAB)
Acid-base deficit: 2
Bicarbonate: 18.7 — ABNORMAL LOW
Glucose, Bld: 105 — ABNORMAL HIGH
HCT: 43
Hemoglobin: 14.6
Operator id: 294501
Potassium: 3.8
Sodium: 138
TCO2: 19

## 2011-09-23 LAB — DIFFERENTIAL
Basophils Relative: 0
Eosinophils Absolute: 0
Eosinophils Relative: 0
Lymphocytes Relative: 10 — ABNORMAL LOW
Monocytes Absolute: 0.6
Neutro Abs: 9.9 — ABNORMAL HIGH

## 2011-09-23 LAB — CBC
HCT: 39.6
Hemoglobin: 13.3
MCHC: 33.6
Platelets: 300
RDW: 14
WBC: 11.8 — ABNORMAL HIGH

## 2011-09-23 LAB — CARDIAC PANEL(CRET KIN+CKTOT+MB+TROPI)
Relative Index: INVALID
Relative Index: 2.3
Troponin I: 0.01
Troponin I: 0.02

## 2011-09-23 LAB — URINALYSIS, ROUTINE W REFLEX MICROSCOPIC
Hgb urine dipstick: NEGATIVE
Protein, ur: NEGATIVE
Specific Gravity, Urine: 1.01
pH: 6.5

## 2011-09-23 LAB — POCT I-STAT CREATININE: Operator id: 294501

## 2011-09-23 LAB — LIPID PANEL
HDL: 59
LDL Cholesterol: 66
Total CHOL/HDL Ratio: 2.4
VLDL: 15

## 2011-09-23 LAB — POCT CARDIAC MARKERS: Myoglobin, poc: 55.6

## 2011-12-31 DIAGNOSIS — M4310 Spondylolisthesis, site unspecified: Secondary | ICD-10-CM | POA: Diagnosis not present

## 2012-01-13 DIAGNOSIS — M47817 Spondylosis without myelopathy or radiculopathy, lumbosacral region: Secondary | ICD-10-CM | POA: Diagnosis not present

## 2012-01-13 DIAGNOSIS — M48061 Spinal stenosis, lumbar region without neurogenic claudication: Secondary | ICD-10-CM | POA: Diagnosis not present

## 2012-01-13 DIAGNOSIS — M5137 Other intervertebral disc degeneration, lumbosacral region: Secondary | ICD-10-CM | POA: Diagnosis not present

## 2012-01-24 ENCOUNTER — Other Ambulatory Visit: Payer: Self-pay | Admitting: Family Medicine

## 2012-01-24 DIAGNOSIS — Z1231 Encounter for screening mammogram for malignant neoplasm of breast: Secondary | ICD-10-CM

## 2012-01-27 DIAGNOSIS — Z79899 Other long term (current) drug therapy: Secondary | ICD-10-CM | POA: Diagnosis not present

## 2012-01-27 DIAGNOSIS — E7800 Pure hypercholesterolemia, unspecified: Secondary | ICD-10-CM | POA: Diagnosis not present

## 2012-02-18 ENCOUNTER — Ambulatory Visit
Admission: RE | Admit: 2012-02-18 | Discharge: 2012-02-18 | Disposition: A | Discharge status: Home / Self Care | Payer: Medicare Other | Source: Ambulatory Visit | Attending: Family Medicine | Admitting: Family Medicine

## 2012-02-18 DIAGNOSIS — Z1231 Encounter for screening mammogram for malignant neoplasm of breast: Secondary | ICD-10-CM

## 2012-03-03 ENCOUNTER — Encounter (HOSPITAL_COMMUNITY): Payer: Self-pay

## 2012-03-03 ENCOUNTER — Emergency Department (HOSPITAL_COMMUNITY): Payer: Medicare Other

## 2012-03-03 ENCOUNTER — Other Ambulatory Visit: Payer: Self-pay

## 2012-03-03 ENCOUNTER — Inpatient Hospital Stay (HOSPITAL_COMMUNITY)
Admission: EM | Admit: 2012-03-03 | Discharge: 2012-03-06 | DRG: 313 | Disposition: A | Discharge status: Home / Self Care | Payer: Medicare Other | Attending: Family Medicine | Admitting: Family Medicine

## 2012-03-03 DIAGNOSIS — Z9861 Coronary angioplasty status: Secondary | ICD-10-CM

## 2012-03-03 DIAGNOSIS — M129 Arthropathy, unspecified: Secondary | ICD-10-CM | POA: Diagnosis present

## 2012-03-03 DIAGNOSIS — E785 Hyperlipidemia, unspecified: Secondary | ICD-10-CM | POA: Diagnosis present

## 2012-03-03 DIAGNOSIS — I10 Essential (primary) hypertension: Secondary | ICD-10-CM | POA: Diagnosis not present

## 2012-03-03 DIAGNOSIS — Z7982 Long term (current) use of aspirin: Secondary | ICD-10-CM | POA: Diagnosis not present

## 2012-03-03 DIAGNOSIS — M4800 Spinal stenosis, site unspecified: Secondary | ICD-10-CM | POA: Diagnosis present

## 2012-03-03 DIAGNOSIS — K219 Gastro-esophageal reflux disease without esophagitis: Secondary | ICD-10-CM | POA: Diagnosis present

## 2012-03-03 DIAGNOSIS — I2510 Atherosclerotic heart disease of native coronary artery without angina pectoris: Secondary | ICD-10-CM | POA: Diagnosis present

## 2012-03-03 DIAGNOSIS — Z79899 Other long term (current) drug therapy: Secondary | ICD-10-CM

## 2012-03-03 DIAGNOSIS — R0789 Other chest pain: Secondary | ICD-10-CM | POA: Diagnosis not present

## 2012-03-03 DIAGNOSIS — D649 Anemia, unspecified: Secondary | ICD-10-CM | POA: Diagnosis not present

## 2012-03-03 DIAGNOSIS — R079 Chest pain, unspecified: Secondary | ICD-10-CM | POA: Diagnosis not present

## 2012-03-03 DIAGNOSIS — R072 Precordial pain: Secondary | ICD-10-CM | POA: Diagnosis not present

## 2012-03-03 DIAGNOSIS — M810 Age-related osteoporosis without current pathological fracture: Secondary | ICD-10-CM | POA: Diagnosis present

## 2012-03-03 HISTORY — DX: Unspecified osteoarthritis, unspecified site: M19.90

## 2012-03-03 HISTORY — DX: Spinal stenosis, site unspecified: M48.00

## 2012-03-03 HISTORY — DX: Gastro-esophageal reflux disease without esophagitis: K21.9

## 2012-03-03 HISTORY — DX: Hyperlipidemia, unspecified: E78.5

## 2012-03-03 HISTORY — DX: Atherosclerotic heart disease of native coronary artery without angina pectoris: I25.10

## 2012-03-03 LAB — POCT I-STAT, CHEM 8
Chloride: 107 mEq/L (ref 96–112)
HCT: 37 % (ref 36.0–46.0)
Hemoglobin: 12.6 g/dL (ref 12.0–15.0)
Potassium: 4.2 mEq/L (ref 3.5–5.1)
Sodium: 140 mEq/L (ref 135–145)

## 2012-03-03 LAB — DIFFERENTIAL
Basophils Absolute: 0 10*3/uL (ref 0.0–0.1)
Basophils Relative: 0 % (ref 0–1)
Eosinophils Relative: 8 % — ABNORMAL HIGH (ref 0–5)
Lymphocytes Relative: 33 % (ref 12–46)
Lymphs Abs: 2.5 10*3/uL (ref 0.7–4.0)
Monocytes Relative: 8 % (ref 3–12)
Neutro Abs: 3.9 10*3/uL (ref 1.7–7.7)

## 2012-03-03 LAB — POCT I-STAT TROPONIN I

## 2012-03-03 LAB — PROTIME-INR
INR: 0.98 (ref 0.00–1.49)
Prothrombin Time: 13.2 seconds (ref 11.6–15.2)

## 2012-03-03 LAB — CBC
HCT: 37.3 % (ref 36.0–46.0)
Hemoglobin: 12.4 g/dL (ref 12.0–15.0)
MCV: 93 fL (ref 78.0–100.0)
RBC: 4.01 MIL/uL (ref 3.87–5.11)
WBC: 7.6 10*3/uL (ref 4.0–10.5)

## 2012-03-03 MED ORDER — DOCUSATE SODIUM 100 MG PO CAPS
200.0000 mg | ORAL_CAPSULE | Freq: Every morning | ORAL | Status: DC
Start: 2012-03-04 — End: 2012-03-06
  Administered 2012-03-04 – 2012-03-06 (×3): 200 mg via ORAL
  Filled 2012-03-03 (×3): qty 2

## 2012-03-03 MED ORDER — CALCIUM CARBONATE ANTACID 500 MG PO CHEW
1.0000 | CHEWABLE_TABLET | Freq: Two times a day (BID) | ORAL | Status: DC
  Administered 2012-03-06: 200 mg via ORAL
  Filled 2012-03-03 (×7): qty 1

## 2012-03-03 MED ORDER — HEPARIN (PORCINE) IN NACL 100-0.45 UNIT/ML-% IJ SOLN
800.0000 [IU]/h | INTRAMUSCULAR | Status: DC
  Administered 2012-03-03 – 2012-03-04 (×2): 800 [IU]/h via INTRAVENOUS
  Filled 2012-03-03 (×5): qty 250

## 2012-03-03 MED ORDER — SODIUM CHLORIDE 0.9 % IJ SOLN
3.0000 mL | Freq: Two times a day (BID) | INTRAMUSCULAR | Status: DC
  Administered 2012-03-04 – 2012-03-06 (×4): 3 mL via INTRAVENOUS

## 2012-03-03 MED ORDER — ATORVASTATIN CALCIUM 40 MG PO TABS
40.0000 mg | ORAL_TABLET | Freq: Every day | ORAL | Status: DC
  Administered 2012-03-05: 40 mg via ORAL
  Filled 2012-03-03 (×3): qty 1

## 2012-03-03 MED ORDER — METOPROLOL SUCCINATE ER 25 MG PO TB24
25.0000 mg | ORAL_TABLET | Freq: Every day | ORAL | Status: DC
  Filled 2012-03-03: qty 1

## 2012-03-03 MED ORDER — PANTOPRAZOLE SODIUM 40 MG PO TBEC
80.0000 mg | DELAYED_RELEASE_TABLET | Freq: Every day | ORAL | Status: DC
  Administered 2012-03-04 – 2012-03-06 (×3): 80 mg via ORAL
  Filled 2012-03-03: qty 2

## 2012-03-03 MED ORDER — METOPROLOL SUCCINATE ER 25 MG PO TB24
25.0000 mg | ORAL_TABLET | Freq: Every day | ORAL | Status: DC
  Administered 2012-03-03 – 2012-03-05 (×2): 25 mg via ORAL
  Filled 2012-03-03 (×4): qty 1

## 2012-03-03 MED ORDER — ISOSORBIDE MONONITRATE 15 MG HALF TABLET
15.0000 mg | ORAL_TABLET | Freq: Every day | ORAL | Status: DC
  Administered 2012-03-03 – 2012-03-05 (×2): 15 mg via ORAL
  Filled 2012-03-03 (×4): qty 1

## 2012-03-03 MED ORDER — CO Q-10 200 MG PO CAPS: 1.0000 | ORAL_CAPSULE | Freq: Every day | ORAL | Status: DC

## 2012-03-03 MED ORDER — DIPHENHYDRAMINE HCL 25 MG PO CAPS
25.0000 mg | ORAL_CAPSULE | Freq: Every evening | ORAL | Status: DC | PRN
  Administered 2012-03-03 – 2012-03-05 (×2): 25 mg via ORAL
  Filled 2012-03-03 (×2): qty 1

## 2012-03-03 MED ORDER — DIPHENHYDRAMINE HCL 25 MG PO CAPS: 25.0000 mg | ORAL_CAPSULE | Freq: Every evening | ORAL | Status: DC | PRN

## 2012-03-03 MED ORDER — OMEGA-3-ACID ETHYL ESTERS 1 G PO CAPS
1.0000 g | ORAL_CAPSULE | Freq: Two times a day (BID) | ORAL | Status: DC
  Administered 2012-03-03 – 2012-03-06 (×5): 1 g via ORAL
  Filled 2012-03-03 (×7): qty 1

## 2012-03-03 MED ORDER — HEPARIN BOLUS VIA INFUSION
3000.0000 [IU] | Freq: Once | INTRAVENOUS | Status: AC
  Administered 2012-03-03: 3000 [IU] via INTRAVENOUS

## 2012-03-03 MED ORDER — ACETAMINOPHEN 500 MG PO TABS
500.0000 mg | ORAL_TABLET | Freq: Every day | ORAL | Status: DC
  Administered 2012-03-03: 500 mg via ORAL
  Filled 2012-03-03 (×2): qty 1

## 2012-03-03 MED ORDER — SODIUM CHLORIDE 0.9 % IV SOLN
INTRAVENOUS | Status: DC
  Administered 2012-03-04: 07:00:00 via INTRAVENOUS

## 2012-03-03 MED ORDER — NITROGLYCERIN 2 % TD OINT
1.0000 [in_us] | TOPICAL_OINTMENT | Freq: Four times a day (QID) | TRANSDERMAL | Status: DC
  Administered 2012-03-03: 1 [in_us] via TOPICAL
  Filled 2012-03-03: qty 1

## 2012-03-03 MED ORDER — ONDANSETRON HCL 4 MG PO TABS: 4.0000 mg | ORAL_TABLET | Freq: Four times a day (QID) | ORAL | Status: DC | PRN

## 2012-03-03 MED ORDER — VITAMIN D3 25 MCG (1000 UNIT) PO TABS
1000.0000 [IU] | ORAL_TABLET | Freq: Two times a day (BID) | ORAL | Status: DC
  Administered 2012-03-06: 1000 [IU] via ORAL
  Filled 2012-03-03 (×7): qty 1

## 2012-03-03 MED ORDER — ONDANSETRON HCL 4 MG/2ML IJ SOLN: 4.0000 mg | Freq: Four times a day (QID) | INTRAMUSCULAR | Status: DC | PRN

## 2012-03-03 MED ORDER — HYDROMORPHONE HCL PF 1 MG/ML IJ SOLN
0.5000 mg | INTRAMUSCULAR | Status: DC | PRN
  Administered 2012-03-04: 0.5 mg via INTRAVENOUS
  Filled 2012-03-03: qty 1

## 2012-03-03 MED ORDER — RANOLAZINE ER 500 MG PO TB12
500.0000 mg | ORAL_TABLET | Freq: Two times a day (BID) | ORAL | Status: DC
  Administered 2012-03-03 – 2012-03-06 (×5): 500 mg via ORAL
  Filled 2012-03-03 (×7): qty 1

## 2012-03-03 MED ORDER — ZOLPIDEM TARTRATE 5 MG PO TABS: 5.0000 mg | ORAL_TABLET | Freq: Every evening | ORAL | Status: DC | PRN

## 2012-03-03 MED ORDER — ASPIRIN EC 325 MG PO TBEC
325.0000 mg | DELAYED_RELEASE_TABLET | Freq: Every day | ORAL | Status: DC
  Administered 2012-03-04 – 2012-03-06 (×3): 325 mg via ORAL
  Filled 2012-03-03 (×4): qty 1

## 2012-03-03 MED ORDER — SODIUM CHLORIDE 0.9 % IV SOLN
20.0000 mL | INTRAVENOUS | Status: DC
  Administered 2012-03-03: 20 mL via INTRAVENOUS
  Administered 2012-03-03: 23:00:00 via INTRAVENOUS

## 2012-03-03 MED ORDER — EZETIMIBE 10 MG PO TABS
10.0000 mg | ORAL_TABLET | Freq: Every day | ORAL | Status: DC
  Administered 2012-03-03 – 2012-03-05 (×2): 10 mg via ORAL
  Filled 2012-03-03 (×4): qty 1

## 2012-03-03 MED ORDER — MORPHINE SULFATE 2 MG/ML IJ SOLN: 2.0000 mg | INTRAMUSCULAR | Status: DC | PRN

## 2012-03-03 NOTE — ED Notes (Signed)
Pt states that 45 minutes ago she has sudden onset of substernal chest pain that radiated to both shoulders and around to her back. She is alert and oriented. Denies any other s/s. Pt is alert and oriented. Pt states that her pain has improved with time. She took 325mg  asa pta. No ekg changes noted. Pt's pcp cardiologist is varanarsi.

## 2012-03-03 NOTE — Progress Notes (Signed)
PHARMACIST - PHYSICIAN ORDER COMMUNICATION  CONCERNING: P&T Medication Policy on Herbal Medications  DESCRIPTION:  This patient's order for:  COENZYME-Q TEN  has been noted.  This product(s) is classified as an "herbal" or natural product. Due to a lack of definitive safety studies or FDA approval, nonstandard manufacturing practices, plus the potential risk of unknown drug-drug interactions while on inpatient medications, the Pharmacy and Therapeutics Committee does not permit the use of "herbal" or natural products of this type within [facility].   ACTION TAKEN: The pharmacy department is unable to verify this order at this time and your patient has been informed of this safety policy. Please reevaluate patient's clinical condition at discharge and address if the herbal or natural product(s) should be resumed at that time.  Herby Abraham, Pharm.D. [phone] 03/03/2012 9:40 PM

## 2012-03-03 NOTE — ED Notes (Signed)
2012-01 Ready

## 2012-03-03 NOTE — ED Provider Notes (Signed)
Medical screening examination/treatment/procedure(s) were conducted as a shared visit with non-physician practitioner(s) and myself.  I personally evaluated the patient during the encounter  The patient will require admission for chest pain initial cardiac workup negative but she will require chest pain rule out discussed with hospitalist and they will admit the patient. No significant EKG changes patient given aspirin prior to arrival.  Shelda Jakes, MD 03/03/12 2020

## 2012-03-03 NOTE — ED Provider Notes (Signed)
History     CSN: [id number]  Arrival date & time 03/03/12  1715   First MD Initiated Contact with Patient 03/03/12 1730      Chief Complaint  Patient presents with  . Chest Pain    substernal to both shoulders    (Consider location/radiation/quality/duration/timing/severity/associated sxs/prior treatment) HPI Comments: Patient here with her family who reports that the patient had sudden onset of substernal chest pain with radiation to bilateral shoulders and upper back along with throbbing sensation into her neck - states that this lasted about 30 minues and resolved after she took an aspirin - she denies nausea, vomiting, shortness of breath, fever, chills, cough, dizziness, passing out.  States that she has a history of stents having been placed in 2006 and that she sees Dr. Eldridge Dace in follow up and has not had any difficulty since that time - she states that during this event, she also checked her blood pressure and noted that is was 180/100 and that normally her blood pressure is almost hypotensive in nature and so this also scared her.  Patient is a 78 y.o. female presenting with chest pain. The history is provided by the patient and a relative. No language interpreter was used.  Chest Pain The chest pain began 3 - 5 hours ago. Duration of episode(s) is 45 minutes. Chest pain occurs rarely. The chest pain is resolved. At its most intense, the pain is at 7/10. The pain is currently at 0/10. The severity of the pain is moderate. The quality of the pain is described as heavy and squeezing. The pain radiates to the left shoulder, right shoulder, right jaw and left jaw. Pertinent negatives for primary symptoms include no fever, no fatigue, no syncope, no shortness of breath, no cough, no wheezing, no palpitations, no abdominal pain, no nausea, no vomiting, no dizziness and no altered mental status. She tried aspirin for the symptoms. Risk factors include being elderly and post-menopausal.  Her  past medical history is significant for CAD and hyperlipidemia.  Procedure history is positive for cardiac catheterization.     Past Medical History  Diagnosis Date  . CAD (coronary artery disease)   . Spinal stenosis   . Acid reflux   . Arthritis   . Osteoporosis   . Hyperlipidemia     Past Surgical History  Procedure Date  . Carotid stent     History reviewed. No pertinent family history.  History  Substance Use Topics  . Smoking status: Never Smoker   . Smokeless tobacco: Not on file  . Alcohol Use: No    OB History    Grav Para Term Preterm Abortions TAB SAB Ect Mult Living                  Review of Systems  Constitutional: Negative for fever and fatigue.  Respiratory: Negative for cough, shortness of breath and wheezing.   Cardiovascular: Positive for chest pain. Negative for palpitations and syncope.  Gastrointestinal: Negative for nausea, vomiting and abdominal pain.  Neurological: Negative for dizziness.  Psychiatric/Behavioral: Negative for altered mental status.  All other systems reviewed and are negative.    Allergies  Codeine; Hydrocodone; Morphine and related; and Sulfa antibiotics  Home Medications   Current Outpatient Rx  Name Route Sig Dispense Refill  . ACETAMINOPHEN 500 MG PO TABS Oral Take 500 mg by mouth at bedtime.    . ASPIRIN EC 81 MG PO TBEC Oral Take 81 mg by mouth every morning.    Marland Kitchen  CALCIUM CARBONATE ANTACID 500 MG PO CHEW Oral Chew 1 tablet by mouth 2 (two) times daily.    Marland Kitchen VITAMIN D 1000 UNITS PO TABS Oral Take 1,000 Units by mouth 2 (two) times daily.    . CO Q-10 200 MG PO CAPS Oral Take 1 tablet by mouth at bedtime.    Marland Kitchen DICLOFENAC SODIUM 75 MG PO TBEC Oral Take 75 mg by mouth 2 (two) times daily.    Marland Kitchen DIPHENHYDRAMINE-APAP (SLEEP) 25-500 MG PO TABS Oral Take 1 tablet by mouth at bedtime.    Marland Kitchen DOCUSATE SODIUM 100 MG PO CAPS Oral Take 200 mg by mouth every morning.    Marland Kitchen ESOMEPRAZOLE MAGNESIUM 40 MG PO CPDR Oral Take 40 mg  by mouth See admin instructions. Takes 1 time in the morning, and take another at night if needed.    Marland Kitchen EZETIMIBE 10 MG PO TABS Oral Take 10 mg by mouth at bedtime.    . ISOSORBIDE MONONITRATE ER 30 MG PO TB24 Oral Take 15 mg by mouth at bedtime.    Marland Kitchen METOPROLOL SUCCINATE ER 25 MG PO TB24 Oral Take 25 mg by mouth at bedtime.    . OMEGA-3-ACID ETHYL ESTERS 1 G PO CAPS Oral Take 1 g by mouth 2 (two) times daily.    Marland Kitchen OVER THE COUNTER MEDICATION Oral Take 2 tablets by mouth 2 (two) times daily. Herb-Lax    . POLYSACCHARIDE IRON 150 MG PO CAPS Oral Take 150 mg by mouth every morning.    Marland Kitchen ALIGN 4 MG PO CAPS Oral Take 1 tablet by mouth every morning.    Marland Kitchen RANOLAZINE ER 500 MG PO TB12 Oral Take 500 mg by mouth 2 (two) times daily.    Marland Kitchen ROSUVASTATIN CALCIUM 20 MG PO TABS Oral Take 20 mg by mouth at bedtime.      BP 128/95  Pulse 80  Temp(Src) 98.4 F (36.9 C) (Oral)  Resp 17  SpO2 100%  Physical Exam  Nursing note and vitals reviewed. Constitutional: She is oriented to person, place, and time. She appears well-developed and well-nourished. No distress.  HENT:  Head: Normocephalic and atraumatic.  Right Ear: External ear normal.  Left Ear: External ear normal.  Nose: Nose normal.  Mouth/Throat: Oropharynx is clear and moist. No oropharyngeal exudate.  Eyes: Conjunctivae are normal. Pupils are equal, round, and reactive to light. No scleral icterus.  Neck: Normal range of motion. Neck supple.  Cardiovascular: Normal rate, regular rhythm and normal heart sounds.  Exam reveals no gallop and no friction rub.   No murmur heard. Pulmonary/Chest: Effort normal and breath sounds normal. No respiratory distress. She has no wheezes. She has no rales. She exhibits no tenderness.  Abdominal: Soft. Bowel sounds are normal. She exhibits no distension. There is no tenderness.  Musculoskeletal: Normal range of motion.       2+ pitting edema with mild ttp to bilateral ankles  Lymphadenopathy:    She has  no cervical adenopathy.  Neurological: She is alert and oriented to person, place, and time. No cranial nerve deficit.  Skin: Skin is warm and dry. No rash noted. No erythema. There is pallor.  Psychiatric: She has a normal mood and affect. Her behavior is normal. Judgment and thought content normal.    ED Course  Procedures (including critical care time)   Labs Reviewed  CBC  DIFFERENTIAL  POCT I-STAT, CHEM 8  POCT I-STAT TROPONIN I  PRO B NATRIURETIC PEPTIDE  MAGNESIUM  PROTIME-INR  APTT  Results for orders placed during the hospital encounter of 03/03/12  CBC      Component Value Range   WBC 7.6  4.0 - 10.5 (K/uL)   RBC 4.01  3.87 - 5.11 (MIL/uL)   Hemoglobin 12.4  12.0 - 15.0 (g/dL)   HCT 16.1  09.6 - 04.5 (%)   MCV 93.0  78.0 - 100.0 (fL)   MCH 30.9  26.0 - 34.0 (pg)   MCHC 33.2  30.0 - 36.0 (g/dL)   RDW 40.9  81.1 - 91.4 (%)   Platelets 265  150 - 400 (K/uL)  DIFFERENTIAL      Component Value Range   Neutrophils Relative 51  43 - 77 (%)   Lymphocytes Relative 33  12 - 46 (%)   Monocytes Relative 8  3 - 12 (%)   Eosinophils Relative 8 (*) 0 - 5 (%)   Basophils Relative 0  0 - 1 (%)   Neutro Abs 3.9  1.7 - 7.7 (K/uL)   Lymphs Abs 2.5  0.7 - 4.0 (K/uL)   Monocytes Absolute 0.6  0.1 - 1.0 (K/uL)   Eosinophils Absolute 0.6  0.0 - 0.7 (K/uL)   Basophils Absolute 0.0  0.0 - 0.1 (K/uL)   WBC Morphology ATYPICAL LYMPHOCYTES    PRO B NATRIURETIC PEPTIDE      Component Value Range   Pro B Natriuretic peptide (BNP) 88.9  0 - 450 (pg/mL)  MAGNESIUM      Component Value Range   Magnesium 2.1  1.5 - 2.5 (mg/dL)  PROTIME-INR      Component Value Range   Prothrombin Time 13.2  11.6 - 15.2 (seconds)   INR 0.98  0.00 - 1.49   APTT      Component Value Range   aPTT 31  24 - 37 (seconds)  POCT I-STAT, CHEM 8      Component Value Range   Sodium 140  135 - 145 (mEq/L)   Potassium 4.2  3.5 - 5.1 (mEq/L)   Chloride 107  96 - 112 (mEq/L)   BUN 18  6 - 23 (mg/dL)    Creatinine, Ser 7.82  0.50 - 1.10 (mg/dL)   Glucose, Bld 95  70 - 99 (mg/dL)   Calcium, Ion 9.56  2.13 - 1.32 (mmol/L)   TCO2 24  0 - 100 (mmol/L)   Hemoglobin 12.6  12.0 - 15.0 (g/dL)   HCT 08.6  57.8 - 46.9 (%)  POCT I-STAT TROPONIN I      Component Value Range   Troponin i, poc 0.02  0.00 - 0.08 (ng/mL)   Comment 3            Dg Chest 2 View  03/03/2012  *RADIOLOGY REPORT*  Clinical Data: Chest pain.  CHEST - 2 VIEW  Comparison: 09/12/2007  Findings: Heart size is normal.  Both lungs are clear.  Pulmonary hyperinflation is seen, suspicious for COPD.  No evidence of pleural effusion.  No mass or lymphadenopathy identified.  IMPRESSION: No active disease.  Suspect COPD.  Original Report Authenticated By: Danae Orleans, M.D.   Mm Digital Screening  02/21/2012  DG SCREEN MAMMOGRAM BILATERAL Bilateral CC and MLO view(s) were taken.  DIGITAL SCREENING MAMMOGRAM WITH CAD: There are scattered fibroglandular densities.  No masses or malignant type calcifications are  identified.  Compared with prior studies.  Images were processed with CAD.  IMPRESSION: No specific mammographic evidence of malignancy.  Next screening mammogram is recommended in one  year.  A result  letter of this screening mammogram will be mailed directly to the patient.  ASSESSMENT: Negative - BI-RADS 1  Screening mammogram in 1 year. ,      Date: 03/03/2012  Rate: 78  Rhythm: normal sinus rhythm  QRS Axis: normal  Intervals: normal  ST/T Wave abnormalities: normal  Conduction Disutrbances:first-degree A-V block   Narrative Interpretation: Reviewed by Dr. Deretha Emory  Old EKG Reviewed: changes noted    Chest Pain   MDM  Patient presents with atypical chest pain that has since resolved.  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  I have discussed the patient with Dr. Nedra Hai with [facility] and we  will admit the patient, Dr. Deretha Emory has also seen the patient - she remains pain free at this time.        Molly [name] [place], [place] 03/03/12 1956

## 2012-03-03 NOTE — ED Notes (Signed)
Pt was sitting in chair this afternoon when developed a throbbing headache and cp described as pressure/heaviness that radiated to both arms. Discomfort was not assoc with sob, n/v, or diaphoresis. EMS was called and en route pain and discomfort gradually subsided. Pt denies discomfort presently. Pt hx of cardiac disease. Pt a&ox3, will cont to monitor.

## 2012-03-03 NOTE — Progress Notes (Signed)
ANTICOAGULATION CONSULT NOTE - Initial Consult  Pharmacy Consult for Heparin Indication: chest pain/ACS  Allergies  Allergen Reactions  . Codeine   . Hydrocodone   . Morphine And Related   . Sulfa Antibiotics     Patient Measurements: Height: 5\' 2"  (157.5 cm) Weight: 140 lb (63.504 kg) IBW/kg (Calculated) : 50.1   Vital Signs: Temp: 98.4 F (36.9 C) (03/22 1835) Temp src: Oral (03/22 1835) BP: 128/95 mmHg (03/22 1835) Pulse Rate: 80  (03/22 1835)  Labs:  Basename 03/03/12 1812 03/03/12 1809 03/03/12 1756  HGB -- 12.6 12.4  HCT -- 37.0 37.3  PLT -- -- 265  APTT 31 -- --  LABPROT 13.2 -- --  INR 0.98 -- --  HEPARINUNFRC -- -- --  CREATININE -- 1.00 --  CKTOTAL -- -- --  CKMB -- -- --  TROPONINI -- -- --   Estimated Creatinine Clearance: 39.3 ml/min (by C-G formula based on Cr of 1).  Medical History: Past Medical History  Diagnosis Date  . CAD (coronary artery disease)   . Spinal stenosis   . Acid reflux   . Arthritis   . Osteoporosis   . Hyperlipidemia     Medications:   (Not in a hospital admission)  Assessment: 78 y/o female patient admitted with chest pain requiring anticoagulation for r/o MI. EKG shows 1st degree av block and CE neg x1.  Goal of Therapy:  Heparin level 0.3-0.7 units/ml   Plan:  Heparin 3000 unit IV bolus followed by infusion at 800 units/hr. Check 8 hour heparin level with daily cbc and heparin level.  Verlene Mayer, PharmD, BCPS Pager [phone] 03/03/2012,8:03 PM

## 2012-03-03 NOTE — Consult Note (Signed)
See dictated note for details.  78 yo WF with known CAD s/p BMS to RCA in 2007 presents with chest pain at rest.  No associated SOB, N/V or diaphoresis.  BP was 190 systolic at time.  Currently pain free.  Admit to telemetry with serial troponins.  Already received ASA and is scheduled to receive heparin.  Hard to say if BP spike was etiology of her CP or the result of severe pain.  EKG is normal and initial troponin is normal.  Will make further recs pending troponin results.

## 2012-03-03 NOTE — ED Notes (Signed)
Patient transported to X-ray 

## 2012-03-04 ENCOUNTER — Other Ambulatory Visit: Payer: Self-pay

## 2012-03-04 ENCOUNTER — Inpatient Hospital Stay (HOSPITAL_COMMUNITY): Payer: Medicare Other

## 2012-03-04 DIAGNOSIS — R079 Chest pain, unspecified: Secondary | ICD-10-CM

## 2012-03-04 DIAGNOSIS — I10 Essential (primary) hypertension: Secondary | ICD-10-CM | POA: Diagnosis not present

## 2012-03-04 DIAGNOSIS — I2510 Atherosclerotic heart disease of native coronary artery without angina pectoris: Secondary | ICD-10-CM | POA: Diagnosis not present

## 2012-03-04 LAB — TSH: TSH: 2.995 u[IU]/mL (ref 0.350–4.500)

## 2012-03-04 LAB — CARDIAC PANEL(CRET KIN+CKTOT+MB+TROPI)
CK, MB: 2.9 ng/mL (ref 0.3–4.0)
Relative Index: 2.1 (ref 0.0–2.5)
Relative Index: 2.4 (ref 0.0–2.5)
Total CK: 157 U/L (ref 7–177)
Troponin I: 0.42 ng/mL (ref <0.30)
Troponin I: 0.46 ng/mL (ref <0.30)
Troponin I: <0.3 ng/mL (ref <0.30)

## 2012-03-04 LAB — CBC
MCHC: 33.3 g/dL (ref 30.0–36.0)
Platelets: 242 10*3/uL (ref 150–400)
RDW: 14.2 % (ref 11.5–15.5)

## 2012-03-04 LAB — BASIC METABOLIC PANEL
CO2: 26 mEq/L (ref 19–32)
Chloride: 111 mEq/L (ref 96–112)
GFR calc Af Amer: 89 mL/min — ABNORMAL LOW (ref >90)
Potassium: 4.2 mEq/L (ref 3.5–5.1)
Sodium: 143 mEq/L (ref 135–145)

## 2012-03-04 LAB — HEPARIN LEVEL (UNFRACTIONATED): Heparin Unfractionated: 0.53 IU/mL (ref 0.30–0.70)

## 2012-03-04 MED ORDER — TECHNETIUM TC 99M TETROFOSMIN IV KIT
10.0000 | PACK | Freq: Once | INTRAVENOUS | Status: AC | PRN
  Administered 2012-03-04: 10 via INTRAVENOUS

## 2012-03-04 MED ORDER — REGADENOSON 0.4 MG/5ML IV SOLN
0.4000 mg | Freq: Once | INTRAVENOUS | Status: AC
  Administered 2012-03-04: 0.4 mg via INTRAVENOUS
  Filled 2012-03-04: qty 5

## 2012-03-04 MED ORDER — PROMETHAZINE HCL 25 MG PO TABS
12.5000 mg | ORAL_TABLET | Freq: Four times a day (QID) | ORAL | Status: DC | PRN
Start: 2012-03-04 — End: 2012-03-04

## 2012-03-04 MED ORDER — ACETAMINOPHEN 500 MG PO TABS
500.0000 mg | ORAL_TABLET | Freq: Four times a day (QID) | ORAL | Status: DC | PRN
  Administered 2012-03-05: 500 mg via ORAL
  Filled 2012-03-04: qty 1

## 2012-03-04 MED ORDER — TECHNETIUM TC 99M TETROFOSMIN IV KIT
30.0000 | PACK | Freq: Once | INTRAVENOUS | Status: AC | PRN
  Administered 2012-03-04: 30 via INTRAVENOUS

## 2012-03-04 MED ORDER — PROMETHAZINE HCL 25 MG/ML IJ SOLN
12.5000 mg | Freq: Four times a day (QID) | INTRAMUSCULAR | Status: DC | PRN
  Administered 2012-03-04: 12.5 mg via INTRAVENOUS
  Filled 2012-03-04 (×2): qty 1

## 2012-03-04 MED ORDER — ACETAMINOPHEN 500 MG PO TABS: 500.0000 mg | ORAL_TABLET | Freq: Four times a day (QID) | ORAL | Status: DC | PRN

## 2012-03-04 NOTE — H&P (Signed)
PCP:   Astrid Divine, MD, MD   Chief Complaint:    HPI: Molly [name] is an 78 y.o. female with history of known coronary disease, status post cardiac stent 2007 to the RCA, history of GERD, osteoporosis, hyperlipidemia, has been in her usual state of health until today when she experienced substernal chest pain lasting 30-60 minute, radiate to both of her shoulders, without associated shortness of breath, nausea, vomiting, or diaphoresis. Evaluation in the emergency room included an EKG which showed sinus rhythm and no acute ST-T changes. Her chest x-ray was clear, suspicious for COPD. She has negative initial cardiac markers, normal white count and hemoglobin, and normal creatinine of 1.0. Hospitalist was asked to evaluate her for chest pain. In the emergency room, she is pain-free.  Rewiew of Systems:  The patient denies anorexia, fever, weight loss,, vision loss, decreased hearing, hoarseness,  syncope, dyspnea on exertion, peripheral edema, balance deficits, hemoptysis, abdominal pain, melena, hematochezia, severe indigestion/heartburn, hematuria, incontinence, genital sores, muscle weakness, suspicious skin lesions, transient blindness, difficulty walking, depression, unusual weight change, abnormal bleeding, enlarged lymph nodes, angioedema, and breast masses.    Past Medical History  Diagnosis Date  . CAD (coronary artery disease)   . Spinal stenosis   . Acid reflux   . Arthritis   . Osteoporosis   . Hyperlipidemia     Past Surgical History  Procedure Date  . Carotid stent     Medications:  HOME MEDS: Prior to Admission medications   Medication Sig Start Date End Date Taking? Authorizing Provider  acetaminophen (TYLENOL) 500 MG tablet Take 500 mg by mouth at bedtime.   Yes Historical Provider, MD  aspirin EC 81 MG tablet Take 81 mg by mouth every morning.   Yes Historical Provider, MD  calcium carbonate (TUMS - DOSED IN MG ELEMENTAL CALCIUM) 500 MG chewable  tablet Chew 1 tablet by mouth 2 (two) times daily.   Yes Historical Provider, MD  cholecalciferol (VITAMIN D) 1000 UNITS tablet Take 1,000 Units by mouth 2 (two) times daily.   Yes Historical Provider, MD  Coenzyme Q10 (CO Q-10) 200 MG CAPS Take 1 tablet by mouth at bedtime.   Yes Historical Provider, MD  diclofenac (VOLTAREN) 75 MG EC tablet Take 75 mg by mouth 2 (two) times daily.   Yes Historical Provider, MD  diphenhydramine-acetaminophen (TYLENOL PM) 25-500 MG TABS Take 1 tablet by mouth at bedtime.   Yes Historical Provider, MD  docusate sodium (COLACE) 100 MG capsule Take 200 mg by mouth every morning.   Yes Historical Provider, MD  esomeprazole (NEXIUM) 40 MG capsule Take 40 mg by mouth See admin instructions. Takes 1 time in the morning, and take another at night if needed.   Yes Historical Provider, MD  ezetimibe (ZETIA) 10 MG tablet Take 10 mg by mouth at bedtime.   Yes Historical Provider, MD  isosorbide mononitrate (IMDUR) 30 MG 24 hr tablet Take 15 mg by mouth at bedtime.   Yes Historical Provider, MD  metoprolol succinate (TOPROL-XL) 25 MG 24 hr tablet Take 25 mg by mouth at bedtime.   Yes Historical Provider, MD  omega-3 acid ethyl esters (LOVAZA) 1 G capsule Take 1 g by mouth 2 (two) times daily.   Yes Historical Provider, MD  OVER THE COUNTER MEDICATION Take 2 tablets by mouth 2 (two) times daily. Herb-Lax   Yes Historical Provider, MD  polysaccharide iron (NIFEREX) 150 MG CAPS capsule Take 150 mg by mouth every morning.   Yes Historical Provider, MD  Probiotic Product (ALIGN) 4 MG CAPS Take 1 tablet by mouth every morning.   Yes Historical Provider, MD  ranolazine (RANEXA) 500 MG 12 hr tablet Take 500 mg by mouth 2 (two) times daily.   Yes Historical Provider, MD  rosuvastatin (CRESTOR) 20 MG tablet Take 20 mg by mouth at bedtime.   Yes Historical Provider, MD     Allergies:  Allergies  Allergen Reactions  . Codeine   . Hydrocodone   . Morphine And Related   . Sulfa  Antibiotics     Social History:   reports that she has never smoked. She does not have any smokeless tobacco history on file. She reports that she does not drink alcohol or use illicit drugs.  Family History: History reviewed. No pertinent family history.   Physical Exam: Filed Vitals:   03/03/12 1900 03/03/12 2104 03/03/12 2222 03/03/12 2250  BP:  134/85 142/80   Pulse:  81 48 86  Temp:   98 F (36.7 C)   TempSrc:   Oral   Resp:  15 17   Height: 5\' 2"  (1.575 m)     Weight: 63.504 kg (140 lb)     SpO2:  99%  98%   Blood pressure 142/80, pulse 86, temperature 98 F (36.7 C), temperature source Oral, resp. rate 17, height 5\' 2"  (1.575 m), weight 63.504 kg (140 lb), SpO2 98.00%.  GEN:  Pleasant person lying in the stretcher in no acute distress; cooperative with exam PSYCH:  alert and oriented x4; does not appear anxious does not appear depressed; affect is normal HEENT: Mucous membranes pink and anicteric; PERRLA; EOM intact; no cervical lymphadenopathy nor thyromegaly or carotid bruit; no JVD; Breasts:: Not examined CHEST WALL: No tenderness CHEST: Normal respiration, clear to auscultation bilaterally HEART: Regular rate and rhythm; no murmurs rubs or gallops BACK: No kyphosis or scoliosis; no CVA tenderness ABDOMEN: Obese, soft non-tender; no masses, no organomegaly, normal abdominal bowel sounds; no pannus; no intertriginous candida. Rectal Exam: Not done EXTREMITIES: No bone or joint deformity; age-appropriate arthropathy of the hands and knees; no edema; no ulcerations. Genitalia: not examined PULSES: 2+ and symmetric SKIN: Normal hydration no rash or ulceration CNS: Cranial nerves 2-12 grossly intact no focal neurologic deficit   Labs & Imaging Results for orders placed during the hospital encounter of 03/03/12 (from the past 48 hour(s))  CBC     Status: Normal   Collection Time   03/03/12  5:56 PM      Component Value Range Comment   WBC 7.6  4.0 - 10.5 (K/uL)     RBC 4.01  3.87 - 5.11 (MIL/uL)    Hemoglobin 12.4  12.0 - 15.0 (g/dL)    HCT 16.1  09.6 - 04.5 (%)    MCV 93.0  78.0 - 100.0 (fL)    MCH 30.9  26.0 - 34.0 (pg)    MCHC 33.2  30.0 - 36.0 (g/dL)    RDW 40.9  81.1 - 91.4 (%)    Platelets 265  150 - 400 (K/uL)   DIFFERENTIAL     Status: Abnormal   Collection Time   03/03/12  5:56 PM      Component Value Range Comment   Neutrophils Relative 51  43 - 77 (%)    Lymphocytes Relative 33  12 - 46 (%)    Monocytes Relative 8  3 - 12 (%)    Eosinophils Relative 8 (*) 0 - 5 (%)    Basophils Relative 0  0 -  1 (%)    Neutro Abs 3.9  1.7 - 7.7 (K/uL)    Lymphs Abs 2.5  0.7 - 4.0 (K/uL)    Monocytes Absolute 0.6  0.1 - 1.0 (K/uL)    Eosinophils Absolute 0.6  0.0 - 0.7 (K/uL)    Basophils Absolute 0.0  0.0 - 0.1 (K/uL)    WBC Morphology ATYPICAL LYMPHOCYTES     POCT I-STAT TROPONIN I     Status: Normal   Collection Time   03/03/12  6:07 PM      Component Value Range Comment   Troponin i, poc 0.02  0.00 - 0.08 (ng/mL)    Comment 3            POCT I-STAT, CHEM 8     Status: Normal   Collection Time   03/03/12  6:09 PM      Component Value Range Comment   Sodium 140  135 - 145 (mEq/L)    Potassium 4.2  3.5 - 5.1 (mEq/L)    Chloride 107  96 - 112 (mEq/L)    BUN 18  6 - 23 (mg/dL)    Creatinine, Ser 1.61  0.50 - 1.10 (mg/dL)    Glucose, Bld 95  70 - 99 (mg/dL)    Calcium, Ion 0.96  1.12 - 1.32 (mmol/L)    TCO2 24  0 - 100 (mmol/L)    Hemoglobin 12.6  12.0 - 15.0 (g/dL)    HCT 04.5  40.9 - 81.1 (%)   PRO B NATRIURETIC PEPTIDE     Status: Normal   Collection Time   03/03/12  6:11 PM      Component Value Range Comment   Pro B Natriuretic peptide (BNP) 88.9  0 - 450 (pg/mL)   MAGNESIUM     Status: Normal   Collection Time   03/03/12  6:12 PM      Component Value Range Comment   Magnesium 2.1  1.5 - 2.5 (mg/dL)   PROTIME-INR     Status: Normal   Collection Time   03/03/12  6:12 PM      Component Value Range Comment   Prothrombin Time 13.2   11.6 - 15.2 (seconds)    INR 0.98  0.00 - 1.49    APTT     Status: Normal   Collection Time   03/03/12  6:12 PM      Component Value Range Comment   aPTT 31  24 - 37 (seconds)   CARDIAC PANEL(CRET KIN+CKTOT+MB+TROPI)     Status: Abnormal   Collection Time   03/03/12  9:34 PM      Component Value Range Comment   Total CK 157  7 - 177 (U/L)    CK, MB 3.3  0.3 - 4.0 (ng/mL)    Troponin I 0.42 (*) <0.30 (ng/mL)    Relative Index 2.1  0.0 - 2.5     Dg Chest 2 View  03/03/2012  *RADIOLOGY REPORT*  Clinical Data: Chest pain.  CHEST - 2 VIEW  Comparison: 09/12/2007  Findings: Heart size is normal.  Both lungs are clear.  Pulmonary hyperinflation is seen, suspicious for COPD.  No evidence of pleural effusion.  No mass or lymphadenopathy identified.  IMPRESSION: No active disease.  Suspect COPD.  Original Report Authenticated By: Danae Orleans, M.D.      Assessment Present on Admission:  .Chest pain at rest .HTN (hypertension) .Hyperlipidemia   PLAN: History is concerning for unstable angina. She has  known coronary disease with cardiac  stent in the RCA. She has been on aspirin and beta blocker along with statin, I will continue all these. She was given topical nitroglycerin the emergency room, but due to its unavailability on the floor, would discontinue it. She has already been on oral nitrate, and he is pain-free at this time. I recommended heparinize her while she is being ruled out. Because of her story, I have consulted cardiology and appreciate Dr. Scharlene Gloss help. I will defer the use of Plavix to cardiology. Will get a cardiac echo. She is stable, full code, and will be admitted to [facility] hospital[facility] service.  Other plans as per orders.   Molly [name] 03/04/2012, 12:26 AM

## 2012-03-04 NOTE — Progress Notes (Signed)
Notified NP Claiborne Billings of pt elevated troponin. No new orders given.

## 2012-03-04 NOTE — Consult Note (Signed)
Molly [name], BICKING NO.:  [id number]  MEDICAL RECORD NO.:  [id number]  LOCATION:  2012                         FACILITY:  [facility]  PHYSICIAN:  Natasha Bence, MD       DATE OF BIRTH:  20-Jun-1929  DATE OF CONSULTATION: DATE OF DISCHARGE:                                CONSULTATION   CONSULTING PHYSICIAN:  Houston Siren, MD, [facility].  The patient's primary cardiologist is Dr. Eldridge Dace.  CHIEF COMPLAINT:  Chest discomfort.  HISTORY OF PRESENT ILLNESS:  Molly [name] is an 78 year old white female with a history of coronary artery disease, status post PCI to her right coronary artery in 2007, who presents with chest discomfort.  She reports the pain was substernal and radiated to bilateral shoulders.  It was not associated with shortness of breath, nausea, vomiting, or diaphoresis.  It lasted approximately 30 minutes in duration and subsided on its own.  She did not take any nitroglycerin for the pain. She reports she also had bilateral arm numbness and tingling during the episode.  She checked her blood pressure when her pain began and noticed her systolic blood pressure was in the 190s.  She reports compliance with all of her medications.  She has not had any recent medication changes.  Otherwise, she has been feeling well.  She denies any PND or orthopnea.  She has not had any dyspnea on exertion, palpitations, lightheadedness, or syncope.  She denies any GI bleeding or melena. Otherwise, no fevers, chills, sweats, or weight loss.  Rest of complete review of systems was performed and was otherwise negative except for what was stated above.  PAST MEDICAL HISTORY: 1. Coronary artery disease, status post bare-metal stent to her right     coronary artery in 2007. 2. Hypertension. 3. Dyslipidemia. 4. GERD. 5. Anemia.  PAST SURGICAL HISTORY:  She has had an appendectomy and partial hysterectomy.  SOCIAL HISTORY:  She is married.  Denies any alcohol or  illicit drug use.  She is a lifelong nonsmoker.  FAMILY HISTORY:  Brother died of heart attack at 27.  Both of her sisters had coronary artery disease.  ALLERGIES:  She is allergic to CODEINE, HYDROCODONE, MORPHINE, and SULFA.  HOME MEDICATIONS:  She is on, 1. Tylenol p.r.n. 2. Aspirin 81 mg p.o. daily. 3. Calcium carbonate 1 tablet b.i.d. 4. Vitamin D 1000 units p.o. b.i.d. 5. Coenzyme Q10 1 tablet at bedtime. 6. Voltaren 75 mg p.o. b.i.d. 7. Tylenol PM p.r.n. 8. Nexium 40 mg p.o. daily. 9. Zetia 10 mg p.o. at bedtime. 10.Imdur 30 mg p.o. daily. 11.Toprol-XL 25 mg p.o. at bedtime. 12.Lovaza 1 g p.o. b.i.d. 13.Niferex 150 mg p.o. daily. 14.Probiotic daily. 15.Ranexa 500 mg p.o. b.i.d. 16.Crestor 20 mg p.o. at bedtime.  PHYSICAL EXAMINATION:  VITAL SIGNS:  She is afebrile, temperature 98.4, pulse of 80 and regular, blood pressure 128/95, respiratory rate of 17, O2 sats of 100% on room air. GENERAL:  She is a well-developed, well-nourished, white female, in no apparent distress. HEENT:  Eyes:  She has anicteric sclerae.  Mucous membranes are moist. NECK:  She has normal jugular venous pressure and no carotid bruits. LUNGS:  Clear to auscultation bilaterally. CARDIOVASCULAR:  She has  a regular rate and rhythm with a faint systolic ejection murmur at right upper sternal border. ABDOMEN:  Soft, nontender, nondistended.  No masses or bruits. SKIN:  No rashes or ulcers. EXTREMITIES:  Warm.  She has nonpitting edema to her mid-shins bilaterally.  She has symmetrical pulses throughout.  LABORATORY STUDIES:  Sodium was 142, potassium was 4.2, chloride was 107, BUN was 18, creatinine was 1, glucose of 95, magnesium of 2.1. Troponin was 0.02.  BNP was 88.9.  Hematocrit was 37.3, white blood cell count was 7.6 with a normal differential, platelet count was 267.  PT was 13.2, INR was 0.98, PTT was 31.  Chest x-ray shows no acute infiltrates or effusions.  EKG shows sinus mechanism  with rate of 78 beats per minute.  There are no ST-T changes.  She does have first- degree AV block.  IMPRESSION AND PLAN:  This is an 78 year old white female with known coronary artery disease, status post stenting with a bare-metal stent to her right coronary artery, who presents with chest discomfort.  She reports she this is the first time she has had any symptoms for quite some time.  She said this was distinctly different type of chest discomfort from her previous pain prior to stenting; however, it was equally severe.  Currently, she is pain free and hemodynamically stable.  Her EKG shows no evidence of ischemia and her first troponin was normal.  She will be admitted to telemetry with serial cardiac enzymes.  Primary team has decided to treated with aspirin and heparin infusion depending how she does clinically.  If she has any recurrence of chest discomfort and how her serial troponins result, we can make further recommendations.  It is difficult to say if the blood pressure spike to 190 systolic was the etiology of her chest pain or this was a result of her chest pain.  Please call with any questions.  If her enzymes are negative, may consider stress testing in the near future.         ______________________________ Natasha Bence, MD    MH/MEDQ  D:  03/03/2012  T:  03/04/2012  Job:  [id number]

## 2012-03-04 NOTE — Progress Notes (Signed)
ANTICOAGULATION CONSULT NOTE   Pharmacy Consult for : Heparin Indication: chest pain/ACS  Allergies  Allergen Reactions  . Codeine   . Hydrocodone   . Morphine And Related   . Sulfa Antibiotics     Patient Measurements: Height: 5\' 2"  (157.5 cm) Weight: 139 lb 12.4 oz (63.4 kg) IBW/kg (Calculated) : 50.1   Vital Signs: Temp: 98.3 F (36.8 C) (03/23 0600) Temp src: Oral (03/23 0600) BP: 144/77 mmHg (03/23 1137) Pulse Rate: 88  (03/23 1137)  Labs:  Basename 03/04/12 1321 03/04/12 0500 03/03/12 2134 03/03/12 1812 03/03/12 1809 03/03/12 1756  HGB -- 11.3* -- -- 12.6 --  HCT -- 33.9* -- -- 37.0 37.3  PLT -- 242 -- -- -- 265  APTT -- -- -- 31 -- --  LABPROT -- -- -- 13.2 -- --  INR -- -- -- 0.98 -- --  HEPARINUNFRC 0.43 0.53 -- -- -- --  CREATININE -- 0.78 -- -- 1.00 --  CKTOTAL 121 126 157 -- -- --  CKMB 2.9 3.2 3.3 -- -- --  TROPONINI <0.30 0.46* 0.42* -- -- --   Estimated Creatinine Clearance: 49.1 ml/min (by C-G formula based on Cr of 0.78).  Medical / Surgical History: Past Medical History  Diagnosis Date  . CAD (coronary artery disease)   . Spinal stenosis   . Acid reflux   . Arthritis   . Osteoporosis   . Hyperlipidemia    Past Surgical History  Procedure Date  . Carotid stent     Medications:  Scheduled:    . acetaminophen  500 mg Oral QHS  . aspirin EC  325 mg Oral Daily  . atorvastatin  40 mg Oral q1800  . calcium carbonate  1 tablet Oral BID  . cholecalciferol  1,000 Units Oral BID  . docusate sodium  200 mg Oral q morning - 10a  . ezetimibe  10 mg Oral QHS  . heparin  3,000 Units Intravenous Once  . isosorbide mononitrate  15 mg Oral QHS  . metoprolol succinate  25 mg Oral QHS  . omega-3 acid ethyl esters  1 g Oral BID  . pantoprazole  80 mg Oral Q1200  . ranolazine  500 mg Oral BID  . regadenoson  0.4 mg Intravenous Once  . sodium chloride  3 mL Intravenous Q12H  . DISCONTD: Co Q-10  1 tablet Oral QHS  . DISCONTD: metoprolol succinate   25 mg Oral Daily  . DISCONTD: nitroGLYCERIN  1 inch Topical Q6H   Infusions:    . sodium chloride 10 mL/hr at 03/03/12 2309  . sodium chloride 75 mL/hr at 03/04/12 0659  . heparin 800 Units/hr (03/03/12 2308)    Assessment: Patient is a 78 y/o female admitted for evaluation of chest pain at rest. Heparin currently infusing at 800 units/hr.  Heparin level therapeutic   0.43.  WBC/Hgb/Hct/Plts:  5.9/11.3/33.9/242 (03/23 0500)  Goal of Therapy:  Heparin level 0.3-0.7 units/ml   Plan:  Continue Heparin at 800 units/hr. Daily Heparin levels, CBC.  Wah Sabic, Elisha Headland, Pharm.D. 03/04/2012 3:27 PM

## 2012-03-04 NOTE — Progress Notes (Signed)
Subjective:   Molly [name] is an 78 year old white female  with a history of coronary artery disease, status post PCI to her right  coronary artery in 2007, who presents with chest discomfort. She  reports the pain was substernal and radiated to bilateral shoulders. It  was not associated with shortness of breath, nausea, vomiting, or  diaphoresis. It lasted approximately 30 minutes in duration and  subsided on its own. She did not take any nitroglycerin for the pain.  Chest pain was somewhat similar to her presenting angina pain.  Enzymes are negative x 2. She has been NPO.     Marland Kitchen acetaminophen  500 mg Oral QHS  . aspirin EC  325 mg Oral Daily  . atorvastatin  40 mg Oral q1800  . calcium carbonate  1 tablet Oral BID  . cholecalciferol  1,000 Units Oral BID  . docusate sodium  200 mg Oral q morning - 10a  . ezetimibe  10 mg Oral QHS  . heparin  3,000 Units Intravenous Once  . isosorbide mononitrate  15 mg Oral QHS  . metoprolol succinate  25 mg Oral QHS  . omega-3 acid ethyl esters  1 g Oral BID  . pantoprazole  80 mg Oral Q1200  . ranolazine  500 mg Oral BID  . sodium chloride  3 mL Intravenous Q12H  . DISCONTD: Co Q-10  1 tablet Oral QHS  . DISCONTD: metoprolol succinate  25 mg Oral Daily  . DISCONTD: nitroGLYCERIN  1 inch Topical Q6H      . sodium chloride 10 mL/hr at 03/03/12 2309  . sodium chloride 75 mL/hr at 03/04/12 0659  . heparin 800 Units/hr (03/03/12 2308)    Objective:  Vital Signs in the last 24 hours: Blood pressure 103/62, pulse 73, temperature 98.3 F (36.8 C), temperature source Oral, resp. rate 16, height 5\' 2"  (1.575 m), weight 139 lb 12.4 oz (63.4 kg), SpO2 97.00%. Temp:  [97 F (36.1 C)-98.4 F (36.9 C)] 98.3 F (36.8 C) (03/23 0600) Pulse Rate:  [48-86] 73  (03/23 0600) Resp:  [13-17] 16  (03/23 0600) BP: (103-162)/(62-95) 103/62 mmHg (03/23 0600) SpO2:  [97 %-100 %] 97 % (03/23 0600) Weight:  [139 lb 12.4 oz (63.4 kg)-140 lb (63.504  kg)] 139 lb 12.4 oz (63.4 kg) (03/23 0500)  Intake/Output from previous day:   Intake/Output from this shift:    Physical Exam:  Physical Exam: Blood pressure 103/62, pulse 73, temperature 98.3 F (36.8 C), temperature source Oral, resp. rate 16, height 5\' 2"  (1.575 m), weight 139 lb 12.4 oz (63.4 kg), SpO2 97.00%. General: Well developed, well nourished, in no acute distress. Head: Normocephalic, atraumatic, sclera non-icteric, mucus membranes are moist,  Neck: Supple. Normal carotids. No JVD Lungs: Clear bilaterally to auscultation without wheezes, rales, or rhonchi. Breathing is unlabored. Heart: Regular rate,  With normal  S1 S2. No murmurs, rubs, or gallops  Abdomen: Soft, non-tender, non-distended with normoactive bowel sounds. No hepatomegaly. No rebound/guarding. No abdominal masses. Msk:  Strength and tone appear normal for age. Extremities: No clubbing or cyanosis. No edema.  Distal pedal pulses are 2+ and equal bilaterally. Neuro: Alert and oriented X 3. Moves all extremities spontaneously. Psych:  Responds to questions appropriately with a normal affect.    Lab Results:   Basename 03/04/12 0500 03/03/12 1812 03/03/12 1809  NA 143 -- 140  K 4.2 -- 4.2  CL 111 -- 107  CO2 26 -- --  GLUCOSE 64* -- 95  BUN  15 -- 18  CREATININE 0.78 -- 1.00  CALCIUM 8.7 -- --  MG -- 2.1 --  PHOS -- -- --   No results found for this basename: AST:2,ALT:2,ALKPHOS:2,BILITOT:2,PROT:2,ALBUMIN:2 in the last 72 hours No results found for this basename: LIPASE:2,AMYLASE:2 in the last 72 hours  Basename 03/04/12 0500 03/03/12 1809 03/03/12 1756  WBC 5.9 -- 7.6  NEUTROABS -- -- 3.9  HGB 11.3* 12.6 --  HCT 33.9* 37.0 --  MCV 93.4 -- 93.0  PLT 242 -- 265    Basename 03/04/12 0500 03/03/12 2134  CKTOTAL 126 157  CKMB 3.2 3.3  TROPONINI 0.46* 0.42*    Basename 03/03/12 2135  TSH 2.995  T4TOTAL --  T3FREE --  THYROIDAB --   No results found for this basename:  VITAMINB12,FOLATE,FERRITIN,TIBC,IRON,RETICCTPCT in the last 72 hours   Tele: NSR  Assessment/Plan:   1.chest pain.  Hx of CAD.  Will order [employer] Also getting echo today.  Home tomorrow is she is stable.  Ambulate and DC heparin if myoview is negative.   Disposition: lexiscan myoview this am  Alvia Grove., MD, [facility] 03/04/2012, 10:04 AM LOS: Day 1

## 2012-03-04 NOTE — Plan of Care (Signed)
Problem: Phase I Progression Outcomes Goal: MD aware of Cardiac Marker results Trop at 2130 0.42, NP Claiborne Billings made aware

## 2012-03-04 NOTE — Progress Notes (Signed)
ANTICOAGULATION CONSULT NOTE - Initial Consult  Pharmacy Consult for Heparin Indication: chest pain/ACS  Allergies  Allergen Reactions  . Codeine   . Hydrocodone   . Morphine And Related   . Sulfa Antibiotics     Patient Measurements: Height: 5\' 2"  (157.5 cm) Weight: 139 lb 12.4 oz (63.4 kg) IBW/kg (Calculated) : 50.1   Vital Signs: Temp: 98 F (36.7 C) (03/22 2222) Temp src: Oral (03/22 2222) BP: 142/80 mmHg (03/22 2222) Pulse Rate: 86  (03/22 2250)  Labs:  Basename 03/04/12 0500 03/03/12 2134 03/03/12 1812 03/03/12 1809 03/03/12 1756  HGB 11.3* -- -- 12.6 --  HCT 33.9* -- -- 37.0 37.3  PLT 242 -- -- -- 265  APTT -- -- 31 -- --  LABPROT -- -- 13.2 -- --  INR -- -- 0.98 -- --  HEPARINUNFRC 0.53 -- -- -- --  CREATININE -- -- -- 1.00 --  CKTOTAL -- 157 -- -- --  CKMB -- 3.3 -- -- --  TROPONINI -- 0.42* -- -- --   Estimated Creatinine Clearance: 39.2 ml/min (by C-G formula based on Cr of 1).  Assessment: 78 y/o female patient admitted with chest pain for Heparin   Goal of Therapy:  Heparin level 0.3-0.7 units/ml   Plan: Continue Heparin at current rate  Check heparin level in 8 hours to ensure stays in range  Geannie Risen, PharmD, BCPS   03/04/2012,5:57 AM

## 2012-03-04 NOTE — Progress Notes (Signed)
Subjective: Patient currently denies any chest discomfort.  No acute issues reported.  Objective: Filed Vitals:   03/03/12 2222 03/03/12 2250 03/04/12 0500 03/04/12 0600  BP: 142/80   103/62  Pulse: 48 86  73  Temp: 98 F (36.7 C)   98.3 F (36.8 C)  TempSrc: Oral   Oral  Resp: 17   16  Height:      Weight:   63.4 kg (139 lb 12.4 oz)   SpO2:  98%  97%   Weight change:  No intake or output data in the 24 hours ending 03/04/12 1046  General: Alert, awake, oriented x3, in no acute distress.  HEENT: No bruits, no goiter.  Heart: Regular rate and rhythm, without murmurs, rubs, gallops.  Lungs: Clear to auscultation Musculoskeletal:  No chest discomfort on palpation. Abdomen: Soft, nontender, nondistended, positive bowel sounds.  Neuro: Grossly intact, nonfocal.   Lab Results:  Basename 03/04/12 0500 03/03/12 1812 03/03/12 1809  NA 143 -- 140  K 4.2 -- 4.2  CL 111 -- 107  CO2 26 -- --  GLUCOSE 64* -- 95  BUN 15 -- 18  CREATININE 0.78 -- 1.00  CALCIUM 8.7 -- --  MG -- 2.1 --  PHOS -- -- --   No results found for this basename: AST:2,ALT:2,ALKPHOS:2,BILITOT:2,PROT:2,ALBUMIN:2 in the last 72 hours No results found for this basename: LIPASE:2,AMYLASE:2 in the last 72 hours  Basename 03/04/12 0500 03/03/12 1809 03/03/12 1756  WBC 5.9 -- 7.6  NEUTROABS -- -- 3.9  HGB 11.3* 12.6 --  HCT 33.9* 37.0 --  MCV 93.4 -- 93.0  PLT 242 -- 265    Basename 03/04/12 0500 03/03/12 2134  CKTOTAL 126 157  CKMB 3.2 3.3  CKMBINDEX -- --  TROPONINI 0.46* 0.42*   No components found with this basename: POCBNP:3 No results found for this basename: DDIMER:2 in the last 72 hours No results found for this basename: HGBA1C:2 in the last 72 hours No results found for this basename: CHOL:2,HDL:2,LDLCALC:2,TRIG:2,CHOLHDL:2,LDLDIRECT:2 in the last 72 hours  Basename 03/03/12 2135  TSH 2.995  T4TOTAL --  T3FREE --  THYROIDAB --   No results found for this basename:  VITAMINB12:2,FOLATE:2,FERRITIN:2,TIBC:2,IRON:2,RETICCTPCT:2 in the last 72 hours  Micro Results: No results found for this or any previous visit (from the past 240 hour(s)).  Studies/Results: Dg Chest 2 View  03/03/2012  *RADIOLOGY REPORT*  Clinical Data: Chest pain.  CHEST - 2 VIEW  Comparison: 09/12/2007  Findings: Heart size is normal.  Both lungs are clear.  Pulmonary hyperinflation is seen, suspicious for COPD.  No evidence of pleural effusion.  No mass or lymphadenopathy identified.  IMPRESSION: No active disease.  Suspect COPD.  Original Report Authenticated By: Danae Orleans, M.D.    Medications: I have reviewed the patient's current medications.   Patient Active Hospital Problem List: Chest pain at rest (03/03/2012) Per cardiology.  Will follow up with their recommendations and imaging studies.  CAD (coronary artery disease) (03/03/2012) Continue lipitor   HTN (hypertension) (03/03/2012) Well controlled currently.  Hyperlipidemia (03/03/2012) Pt on statin, stable  GERD (gastroesophageal reflux disease) (03/03/2012) No acute complaints patient is on Protonix.  Currently stable    LOS: 1 day   Penny Pia M.D.  [facility] 03/04/2012, 10:46 AM

## 2012-03-04 NOTE — Progress Notes (Signed)
Pt requested pain med for on going sciatic pain. Explained Tylenol ordered was for at bedtime only. Explained the pain med ordered currently is Dilaudid IV with Zofran incase of N/V. Also explained that given her History of allergies and sensitivity to Narcotics,it would be advisable to take either take Tylenol IV,Toredol or Phenergan IV. Pt agreed on getting the PRN 0.5mg  dilaudid. Pt started Vomiting and feeling flushy within 15 min of administering Dilaudid. MD paged to switch phenergan to IV and Tylenol Re-ordered PRN 6hrs by mouth Per Dr. Penny Pia. Dilaudid and Zofran Dc'd. Pt sound asleep. Family in the room. Will continue to monitor.

## 2012-03-05 DIAGNOSIS — R079 Chest pain, unspecified: Secondary | ICD-10-CM | POA: Diagnosis not present

## 2012-03-05 DIAGNOSIS — I10 Essential (primary) hypertension: Secondary | ICD-10-CM | POA: Diagnosis not present

## 2012-03-05 DIAGNOSIS — I2510 Atherosclerotic heart disease of native coronary artery without angina pectoris: Secondary | ICD-10-CM | POA: Diagnosis not present

## 2012-03-05 LAB — CBC
Hemoglobin: 11.2 g/dL — ABNORMAL LOW (ref 12.0–15.0)
MCH: 31 pg (ref 26.0–34.0)
MCHC: 33 g/dL (ref 30.0–36.0)
Platelets: 240 10*3/uL (ref 150–400)
RDW: 14.4 % (ref 11.5–15.5)

## 2012-03-05 LAB — HEPARIN LEVEL (UNFRACTIONATED): Heparin Unfractionated: 0.47 IU/mL (ref 0.30–0.70)

## 2012-03-05 MED ORDER — ACETAMINOPHEN 500 MG PO TABS
1000.0000 mg | ORAL_TABLET | Freq: Four times a day (QID) | ORAL | Status: DC | PRN
  Administered 2012-03-05 – 2012-03-06 (×2): 1000 mg via ORAL
  Filled 2012-03-05 (×2): qty 2

## 2012-03-05 NOTE — Progress Notes (Signed)
Subjective: Patient has no complaints of chest pain.  No acute issues reported overnight.  Objective: Filed Vitals:   03/04/12 1540 03/04/12 1912 03/05/12 0426 03/05/12 1437  BP: 95/58 111/58 100/65 94/51  Pulse: 73 66 73 77  Temp: 97.3 F (36.3 C)  98.3 F (36.8 C) 98.2 F (36.8 C)  TempSrc: Oral  Oral Oral  Resp: 16  18 16   Height:      Weight:      SpO2: 94%  98% 95%   Weight change:   Intake/Output Summary (Last 24 hours) at 03/05/12 1512 Last data filed at 03/04/12 1600  Gross per 24 hour  Intake      0 ml  Output    300 ml  Net   -300 ml   General: Alert, awake, oriented x3, in no acute distress.  HEENT: No bruits, no goiter.  Heart: Regular rate and rhythm, without murmurs, rubs, gallops.  Lungs: Clear to auscultation  Musculoskeletal: No chest discomfort on palpation.  Abdomen: Soft, nontender, nondistended, positive bowel sounds.  Neuro: Grossly intact, nonfocal.  Lab Results:  Basename 03/04/12 0500 03/03/12 1812 03/03/12 1809  NA 143 -- 140  K 4.2 -- 4.2  CL 111 -- 107  CO2 26 -- --  GLUCOSE 64* -- 95  BUN 15 -- 18  CREATININE 0.78 -- 1.00  CALCIUM 8.7 -- --  MG -- 2.1 --  PHOS -- -- --   No results found for this basename: AST:2,ALT:2,ALKPHOS:2,BILITOT:2,PROT:2,ALBUMIN:2 in the last 72 hours No results found for this basename: LIPASE:2,AMYLASE:2 in the last 72 hours  Basename 03/05/12 0625 03/04/12 0500 03/03/12 1756  WBC 5.6 5.9 --  NEUTROABS -- -- 3.9  HGB 11.2* 11.3* --  HCT 33.9* 33.9* --  MCV 93.9 93.4 --  PLT 240 242 --    Basename 03/04/12 1321 03/04/12 0500 03/03/12 2134  CKTOTAL 121 126 157  CKMB 2.9 3.2 3.3  CKMBINDEX -- -- --  TROPONINI <0.30 0.46* 0.42*   No components found with this basename: POCBNP:3 No results found for this basename: DDIMER:2 in the last 72 hours No results found for this basename: HGBA1C:2 in the last 72 hours No results found for this basename: CHOL:2,HDL:2,LDLCALC:2,TRIG:2,CHOLHDL:2,LDLDIRECT:2 in  the last 72 hours  Basename 03/03/12 2135  TSH 2.995  T4TOTAL --  T3FREE --  THYROIDAB --   No results found for this basename: VITAMINB12:2,FOLATE:2,FERRITIN:2,TIBC:2,IRON:2,RETICCTPCT:2 in the last 72 hours  Micro Results: No results found for this or any previous visit (from the past 240 hour(s)).  Studies/Results: Dg Chest 2 View  03/03/2012  *RADIOLOGY REPORT*  Clinical Data: Chest pain.  CHEST - 2 VIEW  Comparison: 09/12/2007  Findings: Heart size is normal.  Both lungs are clear.  Pulmonary hyperinflation is seen, suspicious for COPD.  No evidence of pleural effusion.  No mass or lymphadenopathy identified.  IMPRESSION: No active disease.  Suspect COPD.  Original Report Authenticated By: Danae Orleans, M.D.   Nm Myocar Multi W/spect W/wall Motion / Ef  03/04/2012  *RADIOLOGY REPORT*  Clinical Data:  Chest pain, history of CAD and hypertension  MYOCARDIAL IMAGING WITH SPECT (REST AND PHARMACOLOGIC-STRESS) GATED LEFT VENTRICULAR WALL MOTION STUDY LEFT VENTRICULAR EJECTION FRACTION  Technique:  Standard myocardial SPECT imaging was performed after resting intravenous injection of 10 mCi Tc-47m sestamibi. Subsequently, intravenous infusion of lexiscan was performed under the supervision of the Cardiology staff.  At peak effect of the drug, 30 mCi Tc-56m sestamibi was injected intravenously and standard myocardial SPECT  imaging  was performed.  Quantitative gated imaging was also performed to evaluate left ventricular wall motion, and estimate left ventricular ejection fraction.  Comparison:  Nuclear cardiac stress test - 09/09/2006  Findings:  Review of the rotational raw images demonstrates mild motion on both the provided rest and stress images.  SPECT imaging demonstrates homogeneous distribution of injected radiotracer without evidence of prior infarction or mismatched defects to suggest pharmacologically induced ischemia.  Quantitative gated analysis shows normal wall motion.  The resting  left ventricular ejection fraction is 75% with end- diastolic volume of 14 ml and end-systolic volume of 55 ml.  IMPRESSION: 1.  No evidence of prior infarction and pharmacologically induced ischemia. 2.  Normal wall motion.  Ejection fraction - 75%.  Original Report Authenticated By: Waynard Reeds, M.D.    Medications: I have reviewed the patient's current medications.   Patient Active Hospital Problem List: Chest pain at rest (03/03/2012) Per cardiology.  Patient got Echo this morning.  Awaiting recommendations regarding disposition.  CAD (coronary artery disease) (03/03/2012) Stable currently continue current regimen.  HTN (hypertension) (03/03/2012) Pt's blood pressure has fluctuated today.  Will continue to monitor and make changes to her medication pending blood pressure readings.  Hyperlipidemia (03/03/2012) Will continue current regimen. Stable  GERD (gastroesophageal reflux disease) (03/03/2012) No acute complaints patient is on protonix.   LOS: 2 days   Penny Pia M.D.  [facility] 03/05/2012, 3:12 PM

## 2012-03-05 NOTE — Progress Notes (Signed)
ANTICOAGULATION CONSULT NOTE - Follow Up Consult  Pharmacy Consult for : Heparin Indication: chest pain/ACS  Allergies  Allergen Reactions  . Hydromorphone Nausea And Vomiting    sensitivity  . Codeine   . Hydrocodone   . Morphine And Related   . Sulfa Antibiotics     Patient Measurements: Height: 5\' 2"  (157.5 cm) Weight: 139 lb 12.4 oz (63.4 kg) IBW/kg (Calculated) : 50.1    Vital Signs: Temp: 98.2 F (36.8 C) (03/24 1437) Temp src: Oral (03/24 1437) BP: 94/51 mmHg (03/24 1437) Pulse Rate: 77  (03/24 1437)  Labs:  Basename 03/05/12 0625 03/04/12 1321 03/04/12 0500 03/03/12 2134 03/03/12 1812 03/03/12 1809 03/03/12 1756  HGB 11.2* -- 11.3* -- -- -- --  HCT 33.9* -- 33.9* -- -- 37.0 --  PLT 240 -- 242 -- -- -- 265  APTT -- -- -- -- 31 -- --  LABPROT -- -- -- -- 13.2 -- --  INR -- -- -- -- 0.98 -- --  HEPARINUNFRC 0.47 0.43 0.53 -- -- -- --  CREATININE -- -- 0.78 -- -- 1.00 --  CKTOTAL -- 121 126 157 -- -- --  CKMB -- 2.9 3.2 3.3 -- -- --  TROPONINI -- <0.30 0.46* 0.42* -- -- --   Estimated Creatinine Clearance: 49.1 ml/min (by C-G formula based on Cr of 0.78).   Medications:  Scheduled:    . aspirin EC  325 mg Oral Daily  . atorvastatin  40 mg Oral q1800  . calcium carbonate  1 tablet Oral BID  . cholecalciferol  1,000 Units Oral BID  . docusate sodium  200 mg Oral q morning - 10a  . ezetimibe  10 mg Oral QHS  . isosorbide mononitrate  15 mg Oral QHS  . metoprolol succinate  25 mg Oral QHS  . omega-3 acid ethyl esters  1 g Oral BID  . pantoprazole  80 mg Oral Q1200  . ranolazine  500 mg Oral BID  . sodium chloride  3 mL Intravenous Q12H   Infusions:    . sodium chloride 10 mL/hr at 03/03/12 2309  . sodium chloride 75 mL/hr at 03/04/12 0659  . heparin 800 Units/hr (03/04/12 2138)    Assessment:  Patient is a 78 y/o female admitted for evaluation of chest pain at rest.   Heparin currently infusing at 800 units/hr. Heparin level therapeutic   Goal  of Therapy:   Heparin level 0.3-0.7 units/ml   Plan:   Continue Heparin at 800 units/hr.   Daily Heparin levels, CBC.   Ernesteen Mihalic, Elisha Headland, Pharm.D. 03/05/2012 3:53 PM

## 2012-03-05 NOTE — Progress Notes (Signed)
*  PRELIMINARY RESULTS* Echocardiogram 2D Echocardiogram has been performed.  Glean Salen [facility] 03/05/2012, 12:03 PM

## 2012-03-06 DIAGNOSIS — I2510 Atherosclerotic heart disease of native coronary artery without angina pectoris: Secondary | ICD-10-CM | POA: Diagnosis not present

## 2012-03-06 DIAGNOSIS — R079 Chest pain, unspecified: Secondary | ICD-10-CM | POA: Diagnosis not present

## 2012-03-06 DIAGNOSIS — R072 Precordial pain: Secondary | ICD-10-CM | POA: Diagnosis not present

## 2012-03-06 DIAGNOSIS — I10 Essential (primary) hypertension: Secondary | ICD-10-CM | POA: Diagnosis not present

## 2012-03-06 LAB — CBC
HCT: 31.8 % — ABNORMAL LOW (ref 36.0–46.0)
MCHC: 33 g/dL (ref 30.0–36.0)
Platelets: 230 10*3/uL (ref 150–400)
RDW: 14.6 % (ref 11.5–15.5)

## 2012-03-06 LAB — HEPARIN LEVEL (UNFRACTIONATED): Heparin Unfractionated: 0.43 IU/mL (ref 0.30–0.70)

## 2012-03-06 MED ORDER — ISOSORBIDE MONONITRATE ER 30 MG PO TB24
30.0000 mg | ORAL_TABLET | Freq: Every day | ORAL | Status: DC
  Filled 2012-03-06: qty 1

## 2012-03-06 NOTE — Progress Notes (Addendum)
SUBJECTIVE:  No further chest pain.  OBJECTIVE:   Vitals:   Filed Vitals:   03/05/12 1437 03/05/12 1700 03/05/12 2121 03/06/12 0601  BP: 94/51 113/76 104/59 103/62  Pulse: 77  76 65  Temp: 98.2 F (36.8 C)  97.5 F (36.4 C) 98.6 F (37 C)  TempSrc: Oral  Oral Oral  Resp: 16  17 18   Height:      Weight:    65.318 kg (144 lb)  SpO2: 95%  98% 96%   I&O's:  No intake or output data in the 24 hours ending 03/06/12 0858 TELEMETRY: Reviewed telemetry pt in NSR:     PHYSICAL EXAM General: Well developed, well nourished, in no acute distress Head:    Normal cephalic and atraumatic  Lungs:   Clear bilaterally to auscultation and percussion. Heart:   HRRR S1 S2  Abdomen:, abdomen soft and non-tender Msk:  Back normal,  Normal strength and tone for age. Extremities:   No clubbing, cyanosis or edema.  DP +1 Neuro: Alert and oriented X 3. Psych:  Good affect, responds appropriately   LABS: Basic Metabolic Panel:  Basename 03/04/12 0500 03/03/12 1812 03/03/12 1809  NA 143 -- 140  K 4.2 -- 4.2  CL 111 -- 107  CO2 26 -- --  GLUCOSE 64* -- 95  BUN 15 -- 18  CREATININE 0.78 -- 1.00  CALCIUM 8.7 -- --  MG -- 2.1 --  PHOS -- -- --   Liver Function Tests: No results found for this basename: AST:2,ALT:2,ALKPHOS:2,BILITOT:2,PROT:2,ALBUMIN:2 in the last 72 hours No results found for this basename: LIPASE:2,AMYLASE:2 in the last 72 hours CBC:  Basename 03/06/12 0615 03/05/12 0625 03/03/12 1756  WBC 6.1 5.6 --  NEUTROABS -- -- 3.9  HGB 10.5* 11.2* --  HCT 31.8* 33.9* --  MCV 94.6 93.9 --  PLT 230 240 --   Cardiac Enzymes:  Basename 03/04/12 1321 03/04/12 0500 03/03/12 2134  CKTOTAL 121 126 157  CKMB 2.9 3.2 3.3  CKMBINDEX -- -- --  TROPONINI <0.30 0.46* 0.42*   BNP: No components found with this basename: POCBNP:3 D-Dimer: No results found for this basename: DDIMER:2 in the last 72 hours Hemoglobin A1C: No results found for this basename: HGBA1C in the last 72  hours Fasting Lipid Panel: No results found for this basename: CHOL,HDL,LDLCALC,TRIG,CHOLHDL,LDLDIRECT in the last 72 hours Thyroid Function Tests:  Basename 03/03/12 2135  TSH 2.995  T4TOTAL --  T3FREE --  THYROIDAB --   Anemia Panel: No results found for this basename: VITAMINB12,FOLATE,FERRITIN,TIBC,IRON,RETICCTPCT in the last 72 hours Coag Panel:   Lab Results  Component Value Date   INR 0.98 03/03/2012   INR 1.0 09/12/2007    RADIOLOGY: Dg Chest 2 View  03/03/2012  *RADIOLOGY REPORT*  Clinical Data: Chest pain.  CHEST - 2 VIEW  Comparison: 09/12/2007  Findings: Heart size is normal.  Both lungs are clear.  Pulmonary hyperinflation is seen, suspicious for COPD.  No evidence of pleural effusion.  No mass or lymphadenopathy identified.  IMPRESSION: No active disease.  Suspect COPD.  Original Report Authenticated By: Danae Orleans, M.D.   Nm Myocar Multi W/spect W/wall Motion / Ef  03/04/2012  *RADIOLOGY REPORT*  Clinical Data:  Chest pain, history of CAD and hypertension  MYOCARDIAL IMAGING WITH SPECT (REST AND PHARMACOLOGIC-STRESS) GATED LEFT VENTRICULAR WALL MOTION STUDY LEFT VENTRICULAR EJECTION FRACTION  Technique:  Standard myocardial SPECT imaging was performed after resting intravenous injection of 10 mCi Tc-22m sestamibi. Subsequently, intravenous infusion of lexiscan was  performed under the supervision of the Cardiology staff.  At peak effect of the drug, 30 mCi Tc-80m sestamibi was injected intravenously and standard myocardial SPECT  imaging was performed.  Quantitative gated imaging was also performed to evaluate left ventricular wall motion, and estimate left ventricular ejection fraction.  Comparison:  Nuclear cardiac stress test - 09/09/2006  Findings:  Review of the rotational raw images demonstrates mild motion on both the provided rest and stress images.  SPECT imaging demonstrates homogeneous distribution of injected radiotracer without evidence of prior infarction or  mismatched defects to suggest pharmacologically induced ischemia.  Quantitative gated analysis shows normal wall motion.  The resting left ventricular ejection fraction is 75% with end- diastolic volume of 14 ml and end-systolic volume of 55 ml.  IMPRESSION: 1.  No evidence of prior infarction and pharmacologically induced ischemia. 2.  Normal wall motion.  Ejection fraction - 75%.  Original Report Authenticated By: Waynard Reeds, M.D.   Mm Digital Screening  02/21/2012  DG SCREEN MAMMOGRAM BILATERAL Bilateral CC and MLO view(s) were taken.  DIGITAL SCREENING MAMMOGRAM WITH CAD: There are scattered fibroglandular densities.  No masses or malignant type calcifications are  identified.  Compared with prior studies.  Images were processed with CAD.  IMPRESSION: No specific mammographic evidence of malignancy.  Next screening mammogram is recommended in one  year.  A result letter of this screening mammogram will be mailed directly to the patient.  ASSESSMENT: Negative - BI-RADS 1  Screening mammogram in 1 year. ,      ASSESSMENT: CP, HTN, CAD  PLAN:  1) No ischemia by stress test.  Normal LV function.  No further chest pain.  WOuld plan medical therapy at this time.  She has had a few abnormal troponins but the last one was normal.  Regardless, no ischemia by lexiscan cardiolite.  Would not pursue cath unless she has refractory sx.    2) CP may have been from HTN.  She has generally low BP at home.  Have given her a new Rx for SL NTG.  Will plan on her checking BPs at home and reporting to our office.  She may need some prn BP med to use.  Will see BP readings over the week and may use prn ACE-I.  WIll call in to her home pharmacy from the office in the next few days if needed.   3) OK to d/c from a cardiac standpoint.  Of note, was on Imdur 60 mg daily at home.  Will d/c echo since LV function normal by nuclear.  Corky Crafts., MD  03/06/2012  8:58 AM

## 2012-03-06 NOTE — Discharge Summary (Signed)
Molly [name],Molly [name] Female, 78 y.o., September 07, 1929  Admit date: 03/03/2012 Discharge date: 03/06/2012  Primary Care Physician:  Astrid Divine, MD, MD   Discharge Diagnoses:   Active Hospital Problems  Diagnoses Date Noted   . CAD (coronary artery disease) 03/03/2012   . HTN (hypertension) 03/03/2012   . Hyperlipidemia 03/03/2012   . GERD (gastroesophageal reflux disease) 03/03/2012     Resolved Hospital Problems  Diagnoses Date Noted Date Resolved  . Chest pain at rest 03/03/2012 03/06/2012     DISCHARGE MEDICATION: Medication List  As of 03/06/2012 10:48 AM   STOP taking these medications         diphenhydramine-acetaminophen 25-500 MG Tabs         TAKE these medications         acetaminophen 500 MG tablet   Commonly known as: TYLENOL   Take 500 mg by mouth at bedtime.      ALIGN 4 MG Caps   Take 1 tablet by mouth every morning.      aspirin EC 81 MG tablet   Take 81 mg by mouth every morning.      calcium carbonate 500 MG chewable tablet   Commonly known as: TUMS - dosed in mg elemental calcium   Chew 1 tablet by mouth 2 (two) times daily.      cholecalciferol 1000 UNITS tablet   Commonly known as: VITAMIN D   Take 1,000 Units by mouth 2 (two) times daily.      Co Q-10 200 MG Caps   Take 1 tablet by mouth at bedtime.      diclofenac 75 MG EC tablet   Commonly known as: VOLTAREN   Take 75 mg by mouth 2 (two) times daily.      docusate sodium 100 MG capsule   Commonly known as: COLACE   Take 200 mg by mouth every morning.      esomeprazole 40 MG capsule   Commonly known as: NEXIUM   Take 40 mg by mouth See admin instructions. Takes 1 time in the morning, and take another at night if needed.      ezetimibe 10 MG tablet   Commonly known as: ZETIA   Take 10 mg by mouth at bedtime.      isosorbide mononitrate 30 MG 24 hr tablet   Commonly known as: IMDUR   Take 15 mg by mouth at bedtime.      metoprolol succinate 25 MG 24 hr tablet   Commonly  known as: TOPROL-XL   Take 25 mg by mouth at bedtime.      omega-3 acid ethyl esters 1 G capsule   Commonly known as: LOVAZA   Take 1 g by mouth 2 (two) times daily.      OVER THE COUNTER MEDICATION   Take 2 tablets by mouth 2 (two) times daily. Herb-Lax      polysaccharide iron 150 MG Caps capsule   Commonly known as: NIFEREX   Take 150 mg by mouth every morning.      ranolazine 500 MG 12 hr tablet   Commonly known as: RANEXA   Take 500 mg by mouth 2 (two) times daily.      rosuvastatin 20 MG tablet   Commonly known as: CRESTOR   Take 20 mg by mouth at bedtime.              Consults:     SIGNIFICANT DIAGNOSTIC STUDIES:  Dg Chest 2 View  03/03/2012  *RADIOLOGY REPORT*  Clinical Data:  Chest pain.  CHEST - 2 VIEW  Comparison: 09/12/2007  Findings: Heart size is normal.  Both lungs are clear.  Pulmonary hyperinflation is seen, suspicious for COPD.  No evidence of pleural effusion.  No mass or lymphadenopathy identified.  IMPRESSION: No active disease.  Suspect COPD.  Original Report Authenticated By: Danae Orleans, M.D.   Nm Myocar Multi W/spect W/wall Motion / Ef  03/04/2012  *RADIOLOGY REPORT*  Clinical Data:  Chest pain, history of CAD and hypertension  MYOCARDIAL IMAGING WITH SPECT (REST AND PHARMACOLOGIC-STRESS) GATED LEFT VENTRICULAR WALL MOTION STUDY LEFT VENTRICULAR EJECTION FRACTION  Technique:  Standard myocardial SPECT imaging was performed after resting intravenous injection of 10 mCi Tc-42m sestamibi. Subsequently, intravenous infusion of lexiscan was performed under the supervision of the Cardiology staff.  At peak effect of the drug, 30 mCi Tc-12m sestamibi was injected intravenously and standard myocardial SPECT  imaging was performed.  Quantitative gated imaging was also performed to evaluate left ventricular wall motion, and estimate left ventricular ejection fraction.  Comparison:  Nuclear cardiac stress test - 09/09/2006  Findings:  Review of the rotational raw  images demonstrates mild motion on both the provided rest and stress images.  SPECT imaging demonstrates homogeneous distribution of injected radiotracer without evidence of prior infarction or mismatched defects to suggest pharmacologically induced ischemia.  Quantitative gated analysis shows normal wall motion.  The resting left ventricular ejection fraction is 75% with end- diastolic volume of 14 ml and end-systolic volume of 55 ml.  IMPRESSION: 1.  No evidence of prior infarction and pharmacologically induced ischemia. 2.  Normal wall motion.  Ejection fraction - 75%.  Original Report Authenticated By: Waynard Reeds, M.D.   Mm Digital Screening  02/21/2012  DG SCREEN MAMMOGRAM BILATERAL Bilateral CC and MLO view(s) were taken.  DIGITAL SCREENING MAMMOGRAM WITH CAD: There are scattered fibroglandular densities.  No masses or malignant type calcifications are  identified.  Compared with prior studies.  Images were processed with CAD.  IMPRESSION: No specific mammographic evidence of malignancy.  Next screening mammogram is recommended in one  year.  A result letter of this screening mammogram will be mailed directly to the patient.  ASSESSMENT: Negative - BI-RADS 1  Screening mammogram in 1 year. ,     ECHO: Study Conclusions 3/24  - Left ventricle: The cavity size was normal. Systolic function was normal. The estimated ejection fraction was in the range of 55% to 60%. Wall motion was normal; there were no regional wall motion abnormalities. Doppler parameters are consistent with abnormal left ventricular relaxation (grade 1 diastolic dysfunction). - Mitral valve: Mild regurgitation.   CARDIAC CATH & OTHER PROCEDURES:   No results found for this or any previous visit (from the past 240 hour(s)).  BRIEF ADMITTING H & P:   Molly [name] is an 78 y.o. female with history of known coronary disease, status post cardiac stent 2007 to the RCA, history of GERD, osteoporosis, hyperlipidemia,  has been in her usual state of health until today when she experienced substernal chest pain lasting 30-60 minute, radiate to both of her shoulders, without associated shortness of breath, nausea, vomiting, or diaphoresis. Evaluation in the emergency room included an EKG which showed sinus rhythm and no acute ST-T changes. Her chest x-ray was clear, suspicious for COPD. She has negative initial cardiac markers, normal white count and hemoglobin, and normal creatinine of 1.0. Hospitalist was asked to evaluate her for chest pain. In the emergency room, she is pain-free.  Patient  was placed on heparin drip and further work up was recommended.  Including cycling cardiac enzymes which two sets came back positive (troponins 0.42 and troponin 0.46 with negative CK MB's)  but the last set was negative.  Today on day of discharge patient is asymptomatic and not complaining of any chest pain.  Chest pain at rest (03/03/2012) Per cardiology the indicated the following:   PLAN:  1) No ischemia by stress test. Normal LV function. No further chest pain. Would plan medical therapy at this time. She has had a few abnormal troponins but the last one was normal. Regardless, no ischemia by lexiscan cardiolite. Would not pursue cath unless she has refractory sx.  2) CP may have been from HTN. She has generally low BP at home. Have given her a new Rx for SL NTG. Will plan on her checking BPs at home and reporting to our office. She may need some prn BP med to use. Will see BP readings over the week and may use prn ACE-I. WIll call in to her home pharmacy from the office in the next few days if needed.  3) OK to d/c from a cardiac standpoint. Of note, was on Imdur 60 mg daily at home. Will d/c echo since LV function normal by nuclear.   CAD (coronary artery disease) (03/03/2012) Continue lipitor   HTN (hypertension) (03/03/2012) Well controlled currently.   Hyperlipidemia (03/03/2012) Pt on statin, stable   GERD  (gastroesophageal reflux disease) (03/03/2012) No acute complaints patient is on Protonix. Currently stable  Disposition:  At this point given her low normal blood pressures will discharge on imdur 30 mg daily and will have her reassessed by her cardiologist on discharge.  Patient is to continue checking her blood pressures at home.    Active Hospital Problems  Diagnoses Date Noted   . CAD (coronary artery disease) 03/03/2012   . HTN (hypertension) 03/03/2012   . Hyperlipidemia 03/03/2012   . GERD (gastroesophageal reflux disease) 03/03/2012     Resolved Hospital Problems  Diagnoses Date Noted Date Resolved  . Chest pain at rest 03/03/2012 03/06/2012     Disposition and Follow-up:  Discharge Orders    Future Orders Please Complete By Expires   Diet - low sodium heart healthy      Increase activity slowly      Discharge instructions      Comments:   Please follow up with your cardiologist in 1 week or sooner should any new concerns arise.  Please check your blood pressure daily as indicated by your heart doctor.  If your blood pressure is low please call your primary care or cardiologist prior to taking blood pressure medication.   Call MD for:  temperature >100.4      Call MD for:  persistant dizziness or light-headedness      Call MD for:  difficulty breathing, headache or visual disturbances        Follow-up Information    Follow up with Astrid Divine, MD .          DISCHARGE EXAM:  General: Alert, awake, oriented x3, in no acute distress. HEENT: No bruits, no goiter. Heart: Regular rate and rhythm, without murmurs, rubs, gallops. Lungs: Clear to auscultation bilaterally. Abdomen: Soft, nontender, nondistended, positive bowel sounds. Extremities: No clubbing cyanosis or edema with positive pedal pulses. Neuro: Grossly intact, nonfocal.   Blood pressure 103/62, pulse 65, temperature 98.6 F (37 C), temperature source Oral, resp. rate 18, height 5\' 2"  (1.575  m),  weight 65.318 kg (144 lb), SpO2 96.00%.   Basename 03/04/12 0500 03/03/12 1812 03/03/12 1809  NA 143 -- 140  K 4.2 -- 4.2  CL 111 -- 107  CO2 26 -- --  GLUCOSE 64* -- 95  BUN 15 -- 18  CREATININE 0.78 -- 1.00  CALCIUM 8.7 -- --  MG -- 2.1 --  PHOS -- -- --   No results found for this basename: AST:2,ALT:2,ALKPHOS:2,BILITOT:2,PROT:2,ALBUMIN:2 in the last 72 hours No results found for this basename: LIPASE:2,AMYLASE:2 in the last 72 hours  Basename 03/06/12 0615 03/05/12 0625 03/03/12 1756  WBC 6.1 5.6 --  NEUTROABS -- -- 3.9  HGB 10.5* 11.2* --  HCT 31.8* 33.9* --  MCV 94.6 93.9 --  PLT 230 240 --    Signed: Penny Pia M.D. 03/06/2012, 10:48 AM

## 2012-03-06 NOTE — Progress Notes (Signed)
   CARE MANAGEMENT NOTE 03/06/2012  Patient:  Molly [name], Molly [name]   Account Number:  [id number]  Date Initiated:  03/06/2012  Documentation initiated by:  Darlyne Russian  Subjective/Objective Assessment:   Patient admitted with chest pain     Action/Plan:   Patient lives at home with spouse.   Anticipated DC Date:  03/06/2012   Anticipated DC Plan:  HOME/SELF CARE      DC Planning Services  CM consult      Choice offered to / List presented to:             Status of service:   Medicare Important Message given?   (If response is "NO", the following Medicare IM given date fields will be blank) Date Medicare IM given:   Date Additional Medicare IM given:    Discharge Disposition:    Per UR Regulation:    If discussed at Long Length of Stay Meetings, dates discussed:    Comments:  PCP: Dr. Maurice Small  03/06/2012 12:30 pm  Darlyne Russian RN, CCM [phone]  Met with patient to discuss discharge planning. She has no needs for home health services. She will contact cardiologist for follow up appointment.

## 2012-03-07 NOTE — Progress Notes (Signed)
Utilization Review Completed.Molly [name] T3/26/2013   

## 2012-03-24 DIAGNOSIS — Z961 Presence of intraocular lens: Secondary | ICD-10-CM | POA: Diagnosis not present

## 2012-03-24 DIAGNOSIS — H02839 Dermatochalasis of unspecified eye, unspecified eyelid: Secondary | ICD-10-CM | POA: Diagnosis not present

## 2012-03-24 DIAGNOSIS — H52209 Unspecified astigmatism, unspecified eye: Secondary | ICD-10-CM | POA: Diagnosis not present

## 2012-03-27 DIAGNOSIS — K219 Gastro-esophageal reflux disease without esophagitis: Secondary | ICD-10-CM | POA: Diagnosis not present

## 2012-03-27 DIAGNOSIS — I209 Angina pectoris, unspecified: Secondary | ICD-10-CM | POA: Diagnosis not present

## 2012-03-27 DIAGNOSIS — I10 Essential (primary) hypertension: Secondary | ICD-10-CM | POA: Diagnosis not present

## 2012-04-14 DIAGNOSIS — M4310 Spondylolisthesis, site unspecified: Secondary | ICD-10-CM | POA: Diagnosis not present

## 2012-04-14 DIAGNOSIS — M546 Pain in thoracic spine: Secondary | ICD-10-CM | POA: Diagnosis not present

## 2012-04-14 DIAGNOSIS — M952 Other acquired deformity of head: Secondary | ICD-10-CM | POA: Diagnosis not present

## 2012-04-14 DIAGNOSIS — M542 Cervicalgia: Secondary | ICD-10-CM | POA: Diagnosis not present

## 2012-04-20 DIAGNOSIS — M545 Low back pain: Secondary | ICD-10-CM | POA: Diagnosis not present

## 2012-04-20 DIAGNOSIS — M47817 Spondylosis without myelopathy or radiculopathy, lumbosacral region: Secondary | ICD-10-CM | POA: Diagnosis not present

## 2012-04-20 DIAGNOSIS — M48061 Spinal stenosis, lumbar region without neurogenic claudication: Secondary | ICD-10-CM | POA: Diagnosis not present

## 2012-04-20 DIAGNOSIS — M4310 Spondylolisthesis, site unspecified: Secondary | ICD-10-CM | POA: Diagnosis not present

## 2012-04-20 DIAGNOSIS — M5137 Other intervertebral disc degeneration, lumbosacral region: Secondary | ICD-10-CM | POA: Diagnosis not present

## 2012-04-24 DIAGNOSIS — E7800 Pure hypercholesterolemia, unspecified: Secondary | ICD-10-CM | POA: Diagnosis not present

## 2012-04-24 DIAGNOSIS — I10 Essential (primary) hypertension: Secondary | ICD-10-CM | POA: Diagnosis not present

## 2012-04-24 DIAGNOSIS — I2510 Atherosclerotic heart disease of native coronary artery without angina pectoris: Secondary | ICD-10-CM | POA: Diagnosis not present

## 2012-05-19 DIAGNOSIS — H0230 Blepharochalasis unspecified eye, unspecified eyelid: Secondary | ICD-10-CM | POA: Diagnosis not present

## 2012-05-19 DIAGNOSIS — M810 Age-related osteoporosis without current pathological fracture: Secondary | ICD-10-CM | POA: Diagnosis not present

## 2012-05-19 DIAGNOSIS — H02409 Unspecified ptosis of unspecified eyelid: Secondary | ICD-10-CM | POA: Diagnosis not present

## 2012-05-29 DIAGNOSIS — M76899 Other specified enthesopathies of unspecified lower limb, excluding foot: Secondary | ICD-10-CM | POA: Diagnosis not present

## 2012-05-29 DIAGNOSIS — M1710 Unilateral primary osteoarthritis, unspecified knee: Secondary | ICD-10-CM | POA: Diagnosis not present

## 2012-07-27 DIAGNOSIS — M545 Low back pain: Secondary | ICD-10-CM | POA: Diagnosis not present

## 2012-07-27 DIAGNOSIS — M4310 Spondylolisthesis, site unspecified: Secondary | ICD-10-CM | POA: Diagnosis not present

## 2012-07-27 DIAGNOSIS — M47817 Spondylosis without myelopathy or radiculopathy, lumbosacral region: Secondary | ICD-10-CM | POA: Diagnosis not present

## 2012-07-31 DIAGNOSIS — M545 Low back pain: Secondary | ICD-10-CM | POA: Diagnosis not present

## 2012-07-31 DIAGNOSIS — I10 Essential (primary) hypertension: Secondary | ICD-10-CM | POA: Diagnosis not present

## 2012-07-31 DIAGNOSIS — E7800 Pure hypercholesterolemia, unspecified: Secondary | ICD-10-CM | POA: Diagnosis not present

## 2012-07-31 DIAGNOSIS — D509 Iron deficiency anemia, unspecified: Secondary | ICD-10-CM | POA: Diagnosis not present

## 2012-07-31 DIAGNOSIS — R12 Heartburn: Secondary | ICD-10-CM | POA: Diagnosis not present

## 2012-08-16 DIAGNOSIS — L570 Actinic keratosis: Secondary | ICD-10-CM | POA: Diagnosis not present

## 2012-08-16 DIAGNOSIS — L821 Other seborrheic keratosis: Secondary | ICD-10-CM | POA: Diagnosis not present

## 2012-08-22 DIAGNOSIS — M545 Low back pain, unspecified: Secondary | ICD-10-CM | POA: Diagnosis not present

## 2012-08-22 DIAGNOSIS — M546 Pain in thoracic spine: Secondary | ICD-10-CM | POA: Diagnosis not present

## 2012-08-22 DIAGNOSIS — M542 Cervicalgia: Secondary | ICD-10-CM | POA: Diagnosis not present

## 2012-08-24 ENCOUNTER — Other Ambulatory Visit: Payer: Self-pay | Admitting: Neurosurgery

## 2012-08-24 DIAGNOSIS — M545 Low back pain: Secondary | ICD-10-CM

## 2012-08-28 DIAGNOSIS — M2040 Other hammer toe(s) (acquired), unspecified foot: Secondary | ICD-10-CM | POA: Diagnosis not present

## 2012-08-28 DIAGNOSIS — M779 Enthesopathy, unspecified: Secondary | ICD-10-CM | POA: Diagnosis not present

## 2012-08-30 ENCOUNTER — Ambulatory Visit
Admission: RE | Admit: 2012-08-30 | Discharge: 2012-08-30 | Disposition: A | Discharge status: Home / Self Care | Payer: Medicare Other | Source: Ambulatory Visit | Attending: Neurosurgery | Admitting: Neurosurgery

## 2012-08-30 DIAGNOSIS — M545 Low back pain: Secondary | ICD-10-CM | POA: Diagnosis not present

## 2012-08-30 DIAGNOSIS — M48061 Spinal stenosis, lumbar region without neurogenic claudication: Secondary | ICD-10-CM | POA: Diagnosis not present

## 2012-08-31 DIAGNOSIS — Z23 Encounter for immunization: Secondary | ICD-10-CM | POA: Diagnosis not present

## 2012-09-04 DIAGNOSIS — M546 Pain in thoracic spine: Secondary | ICD-10-CM | POA: Diagnosis not present

## 2012-10-05 DIAGNOSIS — H53459 Other localized visual field defect, unspecified eye: Secondary | ICD-10-CM | POA: Diagnosis not present

## 2012-10-05 DIAGNOSIS — H02409 Unspecified ptosis of unspecified eyelid: Secondary | ICD-10-CM | POA: Diagnosis not present

## 2012-10-12 DIAGNOSIS — M4310 Spondylolisthesis, site unspecified: Secondary | ICD-10-CM | POA: Diagnosis not present

## 2012-10-12 DIAGNOSIS — M546 Pain in thoracic spine: Secondary | ICD-10-CM | POA: Diagnosis not present

## 2012-10-12 DIAGNOSIS — M47817 Spondylosis without myelopathy or radiculopathy, lumbosacral region: Secondary | ICD-10-CM | POA: Diagnosis not present

## 2012-10-12 DIAGNOSIS — M545 Low back pain: Secondary | ICD-10-CM | POA: Diagnosis not present

## 2012-10-12 DIAGNOSIS — M5137 Other intervertebral disc degeneration, lumbosacral region: Secondary | ICD-10-CM | POA: Diagnosis not present

## 2012-10-18 DIAGNOSIS — M1710 Unilateral primary osteoarthritis, unspecified knee: Secondary | ICD-10-CM | POA: Diagnosis not present

## 2012-11-01 DIAGNOSIS — D649 Anemia, unspecified: Secondary | ICD-10-CM | POA: Diagnosis not present

## 2012-11-17 DIAGNOSIS — M810 Age-related osteoporosis without current pathological fracture: Secondary | ICD-10-CM | POA: Diagnosis not present

## 2012-12-08 DIAGNOSIS — M1710 Unilateral primary osteoarthritis, unspecified knee: Secondary | ICD-10-CM | POA: Diagnosis not present

## 2012-12-08 DIAGNOSIS — M76899 Other specified enthesopathies of unspecified lower limb, excluding foot: Secondary | ICD-10-CM | POA: Diagnosis not present

## 2012-12-20 DIAGNOSIS — M1710 Unilateral primary osteoarthritis, unspecified knee: Secondary | ICD-10-CM | POA: Diagnosis not present

## 2013-01-04 DIAGNOSIS — M4310 Spondylolisthesis, site unspecified: Secondary | ICD-10-CM | POA: Diagnosis not present

## 2013-01-04 DIAGNOSIS — M48061 Spinal stenosis, lumbar region without neurogenic claudication: Secondary | ICD-10-CM | POA: Diagnosis not present

## 2013-01-04 DIAGNOSIS — M5412 Radiculopathy, cervical region: Secondary | ICD-10-CM | POA: Diagnosis not present

## 2013-02-05 DIAGNOSIS — D509 Iron deficiency anemia, unspecified: Secondary | ICD-10-CM | POA: Diagnosis not present

## 2013-02-05 DIAGNOSIS — E7800 Pure hypercholesterolemia, unspecified: Secondary | ICD-10-CM | POA: Diagnosis not present

## 2013-02-05 DIAGNOSIS — M810 Age-related osteoporosis without current pathological fracture: Secondary | ICD-10-CM | POA: Diagnosis not present

## 2013-02-05 DIAGNOSIS — I10 Essential (primary) hypertension: Secondary | ICD-10-CM | POA: Diagnosis not present

## 2013-02-05 DIAGNOSIS — R300 Dysuria: Secondary | ICD-10-CM | POA: Diagnosis not present

## 2013-02-05 DIAGNOSIS — Z1331 Encounter for screening for depression: Secondary | ICD-10-CM | POA: Diagnosis not present

## 2013-02-22 DIAGNOSIS — M4310 Spondylolisthesis, site unspecified: Secondary | ICD-10-CM | POA: Diagnosis not present

## 2013-02-22 DIAGNOSIS — M47817 Spondylosis without myelopathy or radiculopathy, lumbosacral region: Secondary | ICD-10-CM | POA: Diagnosis not present

## 2013-02-22 DIAGNOSIS — M5137 Other intervertebral disc degeneration, lumbosacral region: Secondary | ICD-10-CM | POA: Diagnosis not present

## 2013-03-06 DIAGNOSIS — M5137 Other intervertebral disc degeneration, lumbosacral region: Secondary | ICD-10-CM | POA: Diagnosis not present

## 2013-04-19 DIAGNOSIS — M47817 Spondylosis without myelopathy or radiculopathy, lumbosacral region: Secondary | ICD-10-CM | POA: Diagnosis not present

## 2013-04-19 DIAGNOSIS — M5137 Other intervertebral disc degeneration, lumbosacral region: Secondary | ICD-10-CM | POA: Diagnosis not present

## 2013-04-19 DIAGNOSIS — M4310 Spondylolisthesis, site unspecified: Secondary | ICD-10-CM | POA: Diagnosis not present

## 2013-04-19 DIAGNOSIS — M48061 Spinal stenosis, lumbar region without neurogenic claudication: Secondary | ICD-10-CM | POA: Diagnosis not present

## 2013-04-20 DIAGNOSIS — D649 Anemia, unspecified: Secondary | ICD-10-CM | POA: Diagnosis not present

## 2013-04-20 DIAGNOSIS — K219 Gastro-esophageal reflux disease without esophagitis: Secondary | ICD-10-CM | POA: Diagnosis not present

## 2013-04-20 DIAGNOSIS — M4800 Spinal stenosis, site unspecified: Secondary | ICD-10-CM | POA: Diagnosis not present

## 2013-04-20 DIAGNOSIS — I2510 Atherosclerotic heart disease of native coronary artery without angina pectoris: Secondary | ICD-10-CM | POA: Diagnosis not present

## 2013-04-20 DIAGNOSIS — R609 Edema, unspecified: Secondary | ICD-10-CM | POA: Diagnosis not present

## 2013-04-20 DIAGNOSIS — I10 Essential (primary) hypertension: Secondary | ICD-10-CM | POA: Diagnosis not present

## 2013-04-20 DIAGNOSIS — M810 Age-related osteoporosis without current pathological fracture: Secondary | ICD-10-CM | POA: Diagnosis not present

## 2013-04-20 DIAGNOSIS — E7800 Pure hypercholesterolemia, unspecified: Secondary | ICD-10-CM | POA: Diagnosis not present

## 2013-04-26 DIAGNOSIS — L84 Corns and callosities: Secondary | ICD-10-CM | POA: Diagnosis not present

## 2013-04-26 DIAGNOSIS — M779 Enthesopathy, unspecified: Secondary | ICD-10-CM | POA: Diagnosis not present

## 2013-04-27 DIAGNOSIS — I10 Essential (primary) hypertension: Secondary | ICD-10-CM | POA: Diagnosis not present

## 2013-04-27 DIAGNOSIS — I2510 Atherosclerotic heart disease of native coronary artery without angina pectoris: Secondary | ICD-10-CM | POA: Diagnosis not present

## 2013-04-27 DIAGNOSIS — E7800 Pure hypercholesterolemia, unspecified: Secondary | ICD-10-CM | POA: Diagnosis not present

## 2013-05-14 ENCOUNTER — Other Ambulatory Visit: Payer: Self-pay

## 2013-05-14 DIAGNOSIS — Z1231 Encounter for screening mammogram for malignant neoplasm of breast: Secondary | ICD-10-CM

## 2013-05-18 DIAGNOSIS — M810 Age-related osteoporosis without current pathological fracture: Secondary | ICD-10-CM | POA: Diagnosis not present

## 2013-06-04 DIAGNOSIS — Z961 Presence of intraocular lens: Secondary | ICD-10-CM | POA: Diagnosis not present

## 2013-06-07 DIAGNOSIS — E7800 Pure hypercholesterolemia, unspecified: Secondary | ICD-10-CM | POA: Diagnosis not present

## 2013-06-11 DIAGNOSIS — M4310 Spondylolisthesis, site unspecified: Secondary | ICD-10-CM | POA: Diagnosis not present

## 2013-06-11 DIAGNOSIS — M48062 Spinal stenosis, lumbar region with neurogenic claudication: Secondary | ICD-10-CM | POA: Diagnosis not present

## 2013-06-11 DIAGNOSIS — M47817 Spondylosis without myelopathy or radiculopathy, lumbosacral region: Secondary | ICD-10-CM | POA: Diagnosis not present

## 2013-06-11 DIAGNOSIS — M5137 Other intervertebral disc degeneration, lumbosacral region: Secondary | ICD-10-CM | POA: Diagnosis not present

## 2013-06-13 ENCOUNTER — Ambulatory Visit: Payer: Medicare Other

## 2013-06-19 ENCOUNTER — Ambulatory Visit
Admission: RE | Admit: 2013-06-19 | Discharge: 2013-06-19 | Disposition: A | Discharge status: Home / Self Care | Payer: Medicare Other | Source: Ambulatory Visit

## 2013-06-19 DIAGNOSIS — Z1231 Encounter for screening mammogram for malignant neoplasm of breast: Secondary | ICD-10-CM | POA: Diagnosis not present

## 2013-07-02 DIAGNOSIS — Q828 Other specified congenital malformations of skin: Secondary | ICD-10-CM | POA: Diagnosis not present

## 2013-07-04 DIAGNOSIS — IMO0002 Reserved for concepts with insufficient information to code with codable children: Secondary | ICD-10-CM | POA: Diagnosis not present

## 2013-07-05 DIAGNOSIS — M48061 Spinal stenosis, lumbar region without neurogenic claudication: Secondary | ICD-10-CM | POA: Diagnosis not present

## 2013-07-05 DIAGNOSIS — M5137 Other intervertebral disc degeneration, lumbosacral region: Secondary | ICD-10-CM | POA: Diagnosis not present

## 2013-07-05 DIAGNOSIS — M4310 Spondylolisthesis, site unspecified: Secondary | ICD-10-CM | POA: Diagnosis not present

## 2013-07-05 DIAGNOSIS — M47817 Spondylosis without myelopathy or radiculopathy, lumbosacral region: Secondary | ICD-10-CM | POA: Diagnosis not present

## 2013-07-11 DIAGNOSIS — Z85828 Personal history of other malignant neoplasm of skin: Secondary | ICD-10-CM | POA: Diagnosis not present

## 2013-07-11 DIAGNOSIS — L819 Disorder of pigmentation, unspecified: Secondary | ICD-10-CM | POA: Diagnosis not present

## 2013-07-11 DIAGNOSIS — L821 Other seborrheic keratosis: Secondary | ICD-10-CM | POA: Diagnosis not present

## 2013-07-11 DIAGNOSIS — L570 Actinic keratosis: Secondary | ICD-10-CM | POA: Diagnosis not present

## 2013-09-05 DIAGNOSIS — M25559 Pain in unspecified hip: Secondary | ICD-10-CM | POA: Diagnosis not present

## 2013-09-05 DIAGNOSIS — S32009A Unspecified fracture of unspecified lumbar vertebra, initial encounter for closed fracture: Secondary | ICD-10-CM | POA: Diagnosis not present

## 2013-09-08 ENCOUNTER — Other Ambulatory Visit: Payer: Self-pay | Admitting: Interventional Cardiology

## 2013-09-08 DIAGNOSIS — E7800 Pure hypercholesterolemia, unspecified: Secondary | ICD-10-CM

## 2013-09-08 DIAGNOSIS — Z79899 Other long term (current) drug therapy: Secondary | ICD-10-CM

## 2013-09-11 DIAGNOSIS — M5137 Other intervertebral disc degeneration, lumbosacral region: Secondary | ICD-10-CM | POA: Diagnosis not present

## 2013-09-11 DIAGNOSIS — M47817 Spondylosis without myelopathy or radiculopathy, lumbosacral region: Secondary | ICD-10-CM | POA: Diagnosis not present

## 2013-09-11 DIAGNOSIS — M4310 Spondylolisthesis, site unspecified: Secondary | ICD-10-CM | POA: Diagnosis not present

## 2013-09-11 DIAGNOSIS — M48062 Spinal stenosis, lumbar region with neurogenic claudication: Secondary | ICD-10-CM | POA: Diagnosis not present

## 2013-09-19 DIAGNOSIS — M48062 Spinal stenosis, lumbar region with neurogenic claudication: Secondary | ICD-10-CM | POA: Diagnosis not present

## 2013-09-19 DIAGNOSIS — M4310 Spondylolisthesis, site unspecified: Secondary | ICD-10-CM | POA: Diagnosis not present

## 2013-09-19 DIAGNOSIS — M47817 Spondylosis without myelopathy or radiculopathy, lumbosacral region: Secondary | ICD-10-CM | POA: Diagnosis not present

## 2013-10-01 DIAGNOSIS — E7800 Pure hypercholesterolemia, unspecified: Secondary | ICD-10-CM | POA: Diagnosis not present

## 2013-10-01 DIAGNOSIS — Z23 Encounter for immunization: Secondary | ICD-10-CM | POA: Diagnosis not present

## 2013-10-01 DIAGNOSIS — R609 Edema, unspecified: Secondary | ICD-10-CM | POA: Diagnosis not present

## 2013-10-01 DIAGNOSIS — M4800 Spinal stenosis, site unspecified: Secondary | ICD-10-CM | POA: Diagnosis not present

## 2013-10-02 ENCOUNTER — Telehealth: Payer: Self-pay | Admitting: Interventional Cardiology

## 2013-10-02 NOTE — Telephone Encounter (Signed)
New message    Have appt on 27th

## 2013-10-03 DIAGNOSIS — M810 Age-related osteoporosis without current pathological fracture: Secondary | ICD-10-CM | POA: Diagnosis not present

## 2013-10-05 DIAGNOSIS — R609 Edema, unspecified: Secondary | ICD-10-CM | POA: Diagnosis not present

## 2013-10-05 DIAGNOSIS — W19XXXA Unspecified fall, initial encounter: Secondary | ICD-10-CM | POA: Diagnosis not present

## 2013-10-05 DIAGNOSIS — IMO0001 Reserved for inherently not codable concepts without codable children: Secondary | ICD-10-CM | POA: Diagnosis not present

## 2013-10-05 DIAGNOSIS — M4800 Spinal stenosis, site unspecified: Secondary | ICD-10-CM | POA: Diagnosis not present

## 2013-10-08 ENCOUNTER — Other Ambulatory Visit (INDEPENDENT_AMBULATORY_CARE_PROVIDER_SITE_OTHER): Payer: Medicare Other

## 2013-10-08 DIAGNOSIS — E7800 Pure hypercholesterolemia, unspecified: Secondary | ICD-10-CM | POA: Diagnosis not present

## 2013-10-08 DIAGNOSIS — Z79899 Other long term (current) drug therapy: Secondary | ICD-10-CM

## 2013-10-08 LAB — LIPID PANEL
Total CHOL/HDL Ratio: 2
Triglycerides: 133 mg/dL (ref 0.0–149.0)

## 2013-10-09 ENCOUNTER — Telehealth: Payer: Self-pay | Admitting: Pharmacist

## 2013-10-09 MED ORDER — FISH OIL 1000 MG PO CAPS: ORAL_CAPSULE | ORAL | Status: DC

## 2013-10-09 NOTE — Telephone Encounter (Signed)
Received prior auth from [facility] for Lovaza today.  Her TG have been < 150 mg/dL on Lovaza, so I think it is reasonable to use over the counter fish oil to manage this moving forward.  If TG trend up to > 300 mg/dL on over the counter fish oil for any reason, will have patient change back to Lovaza at this time.  This should save patient money and still control cholesterol adequately.  Plan: 1.  D/C lovaza. 2. Start on  over the counter fish oil 1,000 mg capsules - take 4 per day. Please notify patient, and update med list.  Thanks.

## 2013-10-09 NOTE — Telephone Encounter (Signed)
Pt notified. Meds updated.  

## 2013-10-10 ENCOUNTER — Other Ambulatory Visit: Payer: Self-pay | Admitting: Interventional Cardiology

## 2013-10-10 DIAGNOSIS — R609 Edema, unspecified: Secondary | ICD-10-CM | POA: Diagnosis not present

## 2013-10-10 DIAGNOSIS — IMO0001 Reserved for inherently not codable concepts without codable children: Secondary | ICD-10-CM | POA: Diagnosis not present

## 2013-10-10 DIAGNOSIS — M4800 Spinal stenosis, site unspecified: Secondary | ICD-10-CM | POA: Diagnosis not present

## 2013-10-10 DIAGNOSIS — W19XXXA Unspecified fall, initial encounter: Secondary | ICD-10-CM | POA: Diagnosis not present

## 2013-10-11 ENCOUNTER — Telehealth: Payer: Self-pay | Admitting: Cardiology

## 2013-10-11 ENCOUNTER — Other Ambulatory Visit: Payer: Self-pay | Admitting: Interventional Cardiology

## 2013-10-11 DIAGNOSIS — E785 Hyperlipidemia, unspecified: Secondary | ICD-10-CM

## 2013-10-11 NOTE — Telephone Encounter (Signed)
Cholesterol labs ordered for 04/11/14.

## 2013-10-12 DIAGNOSIS — W19XXXA Unspecified fall, initial encounter: Secondary | ICD-10-CM | POA: Diagnosis not present

## 2013-10-12 DIAGNOSIS — IMO0001 Reserved for inherently not codable concepts without codable children: Secondary | ICD-10-CM | POA: Diagnosis not present

## 2013-10-12 DIAGNOSIS — M4800 Spinal stenosis, site unspecified: Secondary | ICD-10-CM | POA: Diagnosis not present

## 2013-10-12 DIAGNOSIS — R609 Edema, unspecified: Secondary | ICD-10-CM | POA: Diagnosis not present

## 2013-10-16 DIAGNOSIS — IMO0001 Reserved for inherently not codable concepts without codable children: Secondary | ICD-10-CM | POA: Diagnosis not present

## 2013-10-16 DIAGNOSIS — R609 Edema, unspecified: Secondary | ICD-10-CM | POA: Diagnosis not present

## 2013-10-16 DIAGNOSIS — W19XXXA Unspecified fall, initial encounter: Secondary | ICD-10-CM | POA: Diagnosis not present

## 2013-10-16 DIAGNOSIS — M4800 Spinal stenosis, site unspecified: Secondary | ICD-10-CM | POA: Diagnosis not present

## 2013-10-18 DIAGNOSIS — IMO0001 Reserved for inherently not codable concepts without codable children: Secondary | ICD-10-CM | POA: Diagnosis not present

## 2013-10-18 DIAGNOSIS — M4800 Spinal stenosis, site unspecified: Secondary | ICD-10-CM | POA: Diagnosis not present

## 2013-10-18 DIAGNOSIS — R609 Edema, unspecified: Secondary | ICD-10-CM | POA: Diagnosis not present

## 2013-10-18 DIAGNOSIS — W19XXXA Unspecified fall, initial encounter: Secondary | ICD-10-CM | POA: Diagnosis not present

## 2013-10-22 ENCOUNTER — Telehealth: Payer: Self-pay | Admitting: Interventional Cardiology

## 2013-10-22 NOTE — Telephone Encounter (Signed)
Spoke with pt and let her know her Lovaza was not covered by her insurance and she would need to start OTC fish oil. Pt verbalized understanding.

## 2013-10-22 NOTE — Telephone Encounter (Deleted)
Called pt to answer questions.

## 2013-10-22 NOTE — Telephone Encounter (Signed)
New Problem:  Pt states she has some questions for Amy. Pt states she will give more details when Amy calls her back.. Please advise

## 2013-10-23 DIAGNOSIS — M5137 Other intervertebral disc degeneration, lumbosacral region: Secondary | ICD-10-CM | POA: Diagnosis not present

## 2013-10-23 DIAGNOSIS — IMO0001 Reserved for inherently not codable concepts without codable children: Secondary | ICD-10-CM | POA: Diagnosis not present

## 2013-10-23 DIAGNOSIS — W19XXXA Unspecified fall, initial encounter: Secondary | ICD-10-CM | POA: Diagnosis not present

## 2013-10-23 DIAGNOSIS — M4800 Spinal stenosis, site unspecified: Secondary | ICD-10-CM | POA: Diagnosis not present

## 2013-10-23 DIAGNOSIS — R609 Edema, unspecified: Secondary | ICD-10-CM | POA: Diagnosis not present

## 2013-10-23 DIAGNOSIS — M4310 Spondylolisthesis, site unspecified: Secondary | ICD-10-CM | POA: Diagnosis not present

## 2013-10-23 DIAGNOSIS — M48062 Spinal stenosis, lumbar region with neurogenic claudication: Secondary | ICD-10-CM | POA: Diagnosis not present

## 2013-10-23 DIAGNOSIS — M546 Pain in thoracic spine: Secondary | ICD-10-CM | POA: Diagnosis not present

## 2013-10-26 DIAGNOSIS — IMO0001 Reserved for inherently not codable concepts without codable children: Secondary | ICD-10-CM | POA: Diagnosis not present

## 2013-10-26 DIAGNOSIS — W19XXXA Unspecified fall, initial encounter: Secondary | ICD-10-CM | POA: Diagnosis not present

## 2013-10-26 DIAGNOSIS — M4800 Spinal stenosis, site unspecified: Secondary | ICD-10-CM | POA: Diagnosis not present

## 2013-10-26 DIAGNOSIS — R609 Edema, unspecified: Secondary | ICD-10-CM | POA: Diagnosis not present

## 2013-10-31 DIAGNOSIS — M4800 Spinal stenosis, site unspecified: Secondary | ICD-10-CM | POA: Diagnosis not present

## 2013-10-31 DIAGNOSIS — W19XXXA Unspecified fall, initial encounter: Secondary | ICD-10-CM | POA: Diagnosis not present

## 2013-10-31 DIAGNOSIS — R609 Edema, unspecified: Secondary | ICD-10-CM | POA: Diagnosis not present

## 2013-10-31 DIAGNOSIS — IMO0001 Reserved for inherently not codable concepts without codable children: Secondary | ICD-10-CM | POA: Diagnosis not present

## 2013-11-01 DIAGNOSIS — M5137 Other intervertebral disc degeneration, lumbosacral region: Secondary | ICD-10-CM | POA: Diagnosis not present

## 2013-11-01 DIAGNOSIS — M48062 Spinal stenosis, lumbar region with neurogenic claudication: Secondary | ICD-10-CM | POA: Diagnosis not present

## 2013-11-01 DIAGNOSIS — M4310 Spondylolisthesis, site unspecified: Secondary | ICD-10-CM | POA: Diagnosis not present

## 2013-11-01 DIAGNOSIS — M47817 Spondylosis without myelopathy or radiculopathy, lumbosacral region: Secondary | ICD-10-CM | POA: Diagnosis not present

## 2013-11-02 DIAGNOSIS — R609 Edema, unspecified: Secondary | ICD-10-CM | POA: Diagnosis not present

## 2013-11-02 DIAGNOSIS — IMO0001 Reserved for inherently not codable concepts without codable children: Secondary | ICD-10-CM | POA: Diagnosis not present

## 2013-11-02 DIAGNOSIS — W19XXXA Unspecified fall, initial encounter: Secondary | ICD-10-CM | POA: Diagnosis not present

## 2013-11-02 DIAGNOSIS — M4800 Spinal stenosis, site unspecified: Secondary | ICD-10-CM | POA: Diagnosis not present

## 2013-11-06 DIAGNOSIS — R609 Edema, unspecified: Secondary | ICD-10-CM | POA: Diagnosis not present

## 2013-11-06 DIAGNOSIS — W19XXXA Unspecified fall, initial encounter: Secondary | ICD-10-CM | POA: Diagnosis not present

## 2013-11-06 DIAGNOSIS — M4800 Spinal stenosis, site unspecified: Secondary | ICD-10-CM | POA: Diagnosis not present

## 2013-11-06 DIAGNOSIS — IMO0001 Reserved for inherently not codable concepts without codable children: Secondary | ICD-10-CM | POA: Diagnosis not present

## 2013-11-09 DIAGNOSIS — IMO0001 Reserved for inherently not codable concepts without codable children: Secondary | ICD-10-CM | POA: Diagnosis not present

## 2013-11-09 DIAGNOSIS — M4800 Spinal stenosis, site unspecified: Secondary | ICD-10-CM | POA: Diagnosis not present

## 2013-11-09 DIAGNOSIS — W19XXXA Unspecified fall, initial encounter: Secondary | ICD-10-CM | POA: Diagnosis not present

## 2013-11-09 DIAGNOSIS — R609 Edema, unspecified: Secondary | ICD-10-CM | POA: Diagnosis not present

## 2013-11-13 ENCOUNTER — Telehealth: Payer: Self-pay | Admitting: Interventional Cardiology

## 2013-11-13 DIAGNOSIS — IMO0001 Reserved for inherently not codable concepts without codable children: Secondary | ICD-10-CM | POA: Diagnosis not present

## 2013-11-13 DIAGNOSIS — W19XXXA Unspecified fall, initial encounter: Secondary | ICD-10-CM | POA: Diagnosis not present

## 2013-11-13 DIAGNOSIS — R609 Edema, unspecified: Secondary | ICD-10-CM | POA: Diagnosis not present

## 2013-11-13 DIAGNOSIS — M4800 Spinal stenosis, site unspecified: Secondary | ICD-10-CM | POA: Diagnosis not present

## 2013-11-13 NOTE — Telephone Encounter (Signed)
New Problem:  Pt's daughter states she would like some help navigating MyChart. Pt's daughter states she tried to send a message but there were no providers available for her to select. Pt's daughter is requesting a call back

## 2013-11-14 ENCOUNTER — Telehealth: Payer: Self-pay | Admitting: Cardiology

## 2013-11-14 NOTE — Telephone Encounter (Signed)
Any thoughts? 

## 2013-11-14 NOTE — Telephone Encounter (Signed)
Lm on daughters number with number to call to for MyChart technical support.

## 2013-11-14 NOTE — Telephone Encounter (Signed)
Pt has been taking tylenol and tramadol, but the tramadol didn't help. Tylenol only helps slightly. Her PCP is worried about LFTs since pt has been taking tylenol every 4 hrs.

## 2013-11-14 NOTE — Telephone Encounter (Signed)
Pts daughter called stating she is aware that Dr. Eldridge Dace didn't want her mother taking voltaren oral tablets, but she gave her mother voltaren the last two days. Pt has been in a lot of pain. Is there anything that pt can take that is safe from a cardiac standpoint? What would be the best category of drugs for pt to take for the pain, which is cause by her spinal stenosis.

## 2013-11-15 NOTE — Telephone Encounter (Signed)
Her records in [place] show allergy to codeine and morphine.  Depending on what reaction she had to codeine, and lot dose hydrocodone/acetaminaphen option may be an option (5/325 mg every 12 hours).  Voltaren gel could be considered as it isn't systemically absorbed, and therefore less risk than with oral voltaren.  If her pain is all over however, the gel won't work as well - it is better for localized pain.

## 2013-11-16 ENCOUNTER — Telehealth: Payer: Self-pay | Admitting: Interventional Cardiology

## 2013-11-16 DIAGNOSIS — IMO0001 Reserved for inherently not codable concepts without codable children: Secondary | ICD-10-CM | POA: Diagnosis not present

## 2013-11-16 DIAGNOSIS — W19XXXA Unspecified fall, initial encounter: Secondary | ICD-10-CM | POA: Diagnosis not present

## 2013-11-16 DIAGNOSIS — R609 Edema, unspecified: Secondary | ICD-10-CM | POA: Diagnosis not present

## 2013-11-16 DIAGNOSIS — M4800 Spinal stenosis, site unspecified: Secondary | ICD-10-CM | POA: Diagnosis not present

## 2013-11-16 NOTE — Telephone Encounter (Signed)
°  °  ° °  °  Follow Up   Pt's daughter callled states she would like some help navigating MyChart. Pt's daughter states she tried to send a message but there were no providers available for her to select. Pt's daughter is requesting a call back

## 2013-11-16 NOTE — Telephone Encounter (Signed)
Please give the options of Vicodin and Voltaren gel to the  Patient to be discussed with her PMD.

## 2013-11-16 NOTE — Telephone Encounter (Signed)
Please also discuss lidoderm patch with patient as well.  She should discuss this as an option with her PMD.

## 2013-11-16 NOTE — Telephone Encounter (Signed)
Spoke with patient's daughter who would like to know what Dr. Eldridge Dace and Alfonse Ras, PharmD recommend regarding pain relief for patient's spinal stenosis so that when she goes to patient's PCP she will know that the cardiologist agrees to the plan.  Daughter advised that hydrocodone/acetaminophen combination is not an option as patient has vomited in the past when taking these to the point of having bleeding from the stomach.  Daughter states they have already tried voltaren gel and essentially used the whole tube in one application because the pain is in such a large area of the patient's back.  Daughter would like to know if either an anti-depressant and/or a muscle relaxer would be good options for the patient or is there a different anti-inflammatory medication that has less cardiac effects.  I advised daughter that I will send message to Riki Rusk and Dr. Eldridge Dace for advice and call her back with their recommendation(s).

## 2013-11-16 NOTE — Telephone Encounter (Signed)
Follow Up  Pt states she called in on 12/2 has not received a call back// please call

## 2013-11-16 NOTE — Telephone Encounter (Signed)
Something like Lidoderm patch (12 hours on, 12 hours off) may help alleviate the pain given her intolerance or lack of effect from the other agents.  Something like gabapentin could also be an option, but could be sedating.  I would recommend patient see PCP for this.  To Dr. Eldridge Dace to assess as well.

## 2013-11-19 ENCOUNTER — Telehealth: Payer: Self-pay | Admitting: Interventional Cardiology

## 2013-11-19 DIAGNOSIS — M810 Age-related osteoporosis without current pathological fracture: Secondary | ICD-10-CM | POA: Diagnosis not present

## 2013-11-19 NOTE — Telephone Encounter (Signed)
lmtrc

## 2013-11-19 NOTE — Telephone Encounter (Signed)
Returned daughter's call.

## 2013-11-19 NOTE — Telephone Encounter (Signed)
F/U:  Pt's daughter Ollen Gross states she is returning Amy's call.

## 2013-11-19 NOTE — Telephone Encounter (Signed)
pts daughter notified.

## 2013-11-19 NOTE — Telephone Encounter (Signed)
Pt's daughter notified.

## 2013-11-20 DIAGNOSIS — W19XXXA Unspecified fall, initial encounter: Secondary | ICD-10-CM | POA: Diagnosis not present

## 2013-11-20 DIAGNOSIS — IMO0001 Reserved for inherently not codable concepts without codable children: Secondary | ICD-10-CM | POA: Diagnosis not present

## 2013-11-20 DIAGNOSIS — R609 Edema, unspecified: Secondary | ICD-10-CM | POA: Diagnosis not present

## 2013-11-20 DIAGNOSIS — M4800 Spinal stenosis, site unspecified: Secondary | ICD-10-CM | POA: Diagnosis not present

## 2013-11-23 DIAGNOSIS — R609 Edema, unspecified: Secondary | ICD-10-CM | POA: Diagnosis not present

## 2013-11-23 DIAGNOSIS — W19XXXA Unspecified fall, initial encounter: Secondary | ICD-10-CM | POA: Diagnosis not present

## 2013-11-23 DIAGNOSIS — M4800 Spinal stenosis, site unspecified: Secondary | ICD-10-CM | POA: Diagnosis not present

## 2013-11-23 DIAGNOSIS — IMO0001 Reserved for inherently not codable concepts without codable children: Secondary | ICD-10-CM | POA: Diagnosis not present

## 2013-11-27 DIAGNOSIS — IMO0001 Reserved for inherently not codable concepts without codable children: Secondary | ICD-10-CM | POA: Diagnosis not present

## 2013-11-27 DIAGNOSIS — R609 Edema, unspecified: Secondary | ICD-10-CM | POA: Diagnosis not present

## 2013-11-27 DIAGNOSIS — M4800 Spinal stenosis, site unspecified: Secondary | ICD-10-CM | POA: Diagnosis not present

## 2013-11-27 DIAGNOSIS — W19XXXA Unspecified fall, initial encounter: Secondary | ICD-10-CM | POA: Diagnosis not present

## 2013-11-30 DIAGNOSIS — R609 Edema, unspecified: Secondary | ICD-10-CM | POA: Diagnosis not present

## 2013-11-30 DIAGNOSIS — W19XXXA Unspecified fall, initial encounter: Secondary | ICD-10-CM | POA: Diagnosis not present

## 2013-11-30 DIAGNOSIS — IMO0001 Reserved for inherently not codable concepts without codable children: Secondary | ICD-10-CM | POA: Diagnosis not present

## 2013-11-30 DIAGNOSIS — M4800 Spinal stenosis, site unspecified: Secondary | ICD-10-CM | POA: Diagnosis not present

## 2013-12-04 DIAGNOSIS — R609 Edema, unspecified: Secondary | ICD-10-CM | POA: Diagnosis not present

## 2013-12-04 DIAGNOSIS — IMO0001 Reserved for inherently not codable concepts without codable children: Secondary | ICD-10-CM | POA: Diagnosis not present

## 2013-12-04 DIAGNOSIS — M4800 Spinal stenosis, site unspecified: Secondary | ICD-10-CM | POA: Diagnosis not present

## 2013-12-04 DIAGNOSIS — M545 Low back pain: Secondary | ICD-10-CM | POA: Diagnosis not present

## 2013-12-04 DIAGNOSIS — W19XXXA Unspecified fall, initial encounter: Secondary | ICD-10-CM | POA: Diagnosis not present

## 2013-12-07 DIAGNOSIS — R609 Edema, unspecified: Secondary | ICD-10-CM | POA: Diagnosis not present

## 2013-12-07 DIAGNOSIS — M4800 Spinal stenosis, site unspecified: Secondary | ICD-10-CM | POA: Diagnosis not present

## 2013-12-07 DIAGNOSIS — W19XXXA Unspecified fall, initial encounter: Secondary | ICD-10-CM | POA: Diagnosis not present

## 2013-12-07 DIAGNOSIS — M545 Low back pain: Secondary | ICD-10-CM | POA: Diagnosis not present

## 2013-12-07 DIAGNOSIS — IMO0001 Reserved for inherently not codable concepts without codable children: Secondary | ICD-10-CM | POA: Diagnosis not present

## 2013-12-11 DIAGNOSIS — R609 Edema, unspecified: Secondary | ICD-10-CM | POA: Diagnosis not present

## 2013-12-11 DIAGNOSIS — M4800 Spinal stenosis, site unspecified: Secondary | ICD-10-CM | POA: Diagnosis not present

## 2013-12-11 DIAGNOSIS — M545 Low back pain: Secondary | ICD-10-CM | POA: Diagnosis not present

## 2013-12-11 DIAGNOSIS — W19XXXA Unspecified fall, initial encounter: Secondary | ICD-10-CM | POA: Diagnosis not present

## 2013-12-11 DIAGNOSIS — IMO0001 Reserved for inherently not codable concepts without codable children: Secondary | ICD-10-CM | POA: Diagnosis not present

## 2013-12-13 DIAGNOSIS — IMO0001 Reserved for inherently not codable concepts without codable children: Secondary | ICD-10-CM | POA: Diagnosis not present

## 2013-12-13 DIAGNOSIS — W19XXXA Unspecified fall, initial encounter: Secondary | ICD-10-CM | POA: Diagnosis not present

## 2013-12-13 DIAGNOSIS — M545 Low back pain, unspecified: Secondary | ICD-10-CM | POA: Diagnosis not present

## 2013-12-13 DIAGNOSIS — M4800 Spinal stenosis, site unspecified: Secondary | ICD-10-CM | POA: Diagnosis not present

## 2013-12-13 DIAGNOSIS — R609 Edema, unspecified: Secondary | ICD-10-CM | POA: Diagnosis not present

## 2013-12-13 DIAGNOSIS — N183 Chronic kidney disease, stage 3 unspecified: Secondary | ICD-10-CM | POA: Diagnosis not present

## 2013-12-17 DIAGNOSIS — I10 Essential (primary) hypertension: Secondary | ICD-10-CM | POA: Diagnosis not present

## 2013-12-17 DIAGNOSIS — M5430 Sciatica, unspecified side: Secondary | ICD-10-CM | POA: Diagnosis not present

## 2013-12-18 DIAGNOSIS — IMO0001 Reserved for inherently not codable concepts without codable children: Secondary | ICD-10-CM | POA: Diagnosis not present

## 2013-12-18 DIAGNOSIS — M545 Low back pain, unspecified: Secondary | ICD-10-CM | POA: Diagnosis not present

## 2013-12-18 DIAGNOSIS — M4800 Spinal stenosis, site unspecified: Secondary | ICD-10-CM | POA: Diagnosis not present

## 2013-12-18 DIAGNOSIS — N183 Chronic kidney disease, stage 3 unspecified: Secondary | ICD-10-CM | POA: Diagnosis not present

## 2013-12-18 DIAGNOSIS — R609 Edema, unspecified: Secondary | ICD-10-CM | POA: Diagnosis not present

## 2013-12-18 DIAGNOSIS — W19XXXA Unspecified fall, initial encounter: Secondary | ICD-10-CM | POA: Diagnosis not present

## 2013-12-21 DIAGNOSIS — M4800 Spinal stenosis, site unspecified: Secondary | ICD-10-CM | POA: Diagnosis not present

## 2013-12-21 DIAGNOSIS — M545 Low back pain, unspecified: Secondary | ICD-10-CM | POA: Diagnosis not present

## 2013-12-21 DIAGNOSIS — R609 Edema, unspecified: Secondary | ICD-10-CM | POA: Diagnosis not present

## 2013-12-21 DIAGNOSIS — IMO0001 Reserved for inherently not codable concepts without codable children: Secondary | ICD-10-CM | POA: Diagnosis not present

## 2013-12-21 DIAGNOSIS — W19XXXA Unspecified fall, initial encounter: Secondary | ICD-10-CM | POA: Diagnosis not present

## 2013-12-21 DIAGNOSIS — N183 Chronic kidney disease, stage 3 unspecified: Secondary | ICD-10-CM | POA: Diagnosis not present

## 2013-12-25 DIAGNOSIS — R609 Edema, unspecified: Secondary | ICD-10-CM | POA: Diagnosis not present

## 2013-12-25 DIAGNOSIS — M4310 Spondylolisthesis, site unspecified: Secondary | ICD-10-CM | POA: Diagnosis not present

## 2013-12-25 DIAGNOSIS — M545 Low back pain, unspecified: Secondary | ICD-10-CM | POA: Diagnosis not present

## 2013-12-25 DIAGNOSIS — IMO0001 Reserved for inherently not codable concepts without codable children: Secondary | ICD-10-CM | POA: Diagnosis not present

## 2013-12-25 DIAGNOSIS — M4800 Spinal stenosis, site unspecified: Secondary | ICD-10-CM | POA: Diagnosis not present

## 2013-12-25 DIAGNOSIS — M47817 Spondylosis without myelopathy or radiculopathy, lumbosacral region: Secondary | ICD-10-CM | POA: Diagnosis not present

## 2013-12-25 DIAGNOSIS — M48062 Spinal stenosis, lumbar region with neurogenic claudication: Secondary | ICD-10-CM | POA: Diagnosis not present

## 2013-12-25 DIAGNOSIS — W19XXXA Unspecified fall, initial encounter: Secondary | ICD-10-CM | POA: Diagnosis not present

## 2013-12-25 DIAGNOSIS — E669 Obesity, unspecified: Secondary | ICD-10-CM | POA: Diagnosis not present

## 2013-12-25 DIAGNOSIS — N183 Chronic kidney disease, stage 3 unspecified: Secondary | ICD-10-CM | POA: Diagnosis not present

## 2013-12-28 DIAGNOSIS — M545 Low back pain, unspecified: Secondary | ICD-10-CM | POA: Diagnosis not present

## 2013-12-28 DIAGNOSIS — W19XXXA Unspecified fall, initial encounter: Secondary | ICD-10-CM | POA: Diagnosis not present

## 2013-12-28 DIAGNOSIS — R609 Edema, unspecified: Secondary | ICD-10-CM | POA: Diagnosis not present

## 2013-12-28 DIAGNOSIS — IMO0001 Reserved for inherently not codable concepts without codable children: Secondary | ICD-10-CM | POA: Diagnosis not present

## 2013-12-28 DIAGNOSIS — M4800 Spinal stenosis, site unspecified: Secondary | ICD-10-CM | POA: Diagnosis not present

## 2013-12-28 DIAGNOSIS — N183 Chronic kidney disease, stage 3 unspecified: Secondary | ICD-10-CM | POA: Diagnosis not present

## 2014-01-01 DIAGNOSIS — M545 Low back pain, unspecified: Secondary | ICD-10-CM | POA: Diagnosis not present

## 2014-01-01 DIAGNOSIS — IMO0001 Reserved for inherently not codable concepts without codable children: Secondary | ICD-10-CM | POA: Diagnosis not present

## 2014-01-01 DIAGNOSIS — N183 Chronic kidney disease, stage 3 unspecified: Secondary | ICD-10-CM | POA: Diagnosis not present

## 2014-01-01 DIAGNOSIS — R609 Edema, unspecified: Secondary | ICD-10-CM | POA: Diagnosis not present

## 2014-01-01 DIAGNOSIS — M4800 Spinal stenosis, site unspecified: Secondary | ICD-10-CM | POA: Diagnosis not present

## 2014-01-01 DIAGNOSIS — W19XXXA Unspecified fall, initial encounter: Secondary | ICD-10-CM | POA: Diagnosis not present

## 2014-01-03 ENCOUNTER — Encounter (HOSPITAL_COMMUNITY): Payer: Self-pay | Admitting: Emergency Medicine

## 2014-01-03 ENCOUNTER — Emergency Department (HOSPITAL_COMMUNITY): Payer: Medicare Other

## 2014-01-03 ENCOUNTER — Emergency Department (HOSPITAL_COMMUNITY)
Admission: EM | Admit: 2014-01-03 | Discharge: 2014-01-03 | Disposition: A | Discharge status: Home / Self Care | Payer: Medicare Other | Attending: Emergency Medicine | Admitting: Emergency Medicine

## 2014-01-03 DIAGNOSIS — E785 Hyperlipidemia, unspecified: Secondary | ICD-10-CM | POA: Insufficient documentation

## 2014-01-03 DIAGNOSIS — Y939 Activity, unspecified: Secondary | ICD-10-CM | POA: Insufficient documentation

## 2014-01-03 DIAGNOSIS — M129 Arthropathy, unspecified: Secondary | ICD-10-CM | POA: Diagnosis not present

## 2014-01-03 DIAGNOSIS — R42 Dizziness and giddiness: Secondary | ICD-10-CM | POA: Diagnosis not present

## 2014-01-03 DIAGNOSIS — S41009A Unspecified open wound of unspecified shoulder, initial encounter: Secondary | ICD-10-CM | POA: Diagnosis not present

## 2014-01-03 DIAGNOSIS — Z23 Encounter for immunization: Secondary | ICD-10-CM | POA: Diagnosis not present

## 2014-01-03 DIAGNOSIS — S79929A Unspecified injury of unspecified thigh, initial encounter: Secondary | ICD-10-CM | POA: Diagnosis not present

## 2014-01-03 DIAGNOSIS — M25559 Pain in unspecified hip: Secondary | ICD-10-CM | POA: Diagnosis not present

## 2014-01-03 DIAGNOSIS — W010XXA Fall on same level from slipping, tripping and stumbling without subsequent striking against object, initial encounter: Secondary | ICD-10-CM | POA: Insufficient documentation

## 2014-01-03 DIAGNOSIS — S0990XA Unspecified injury of head, initial encounter: Secondary | ICD-10-CM | POA: Diagnosis not present

## 2014-01-03 DIAGNOSIS — K219 Gastro-esophageal reflux disease without esophagitis: Secondary | ICD-10-CM | POA: Diagnosis not present

## 2014-01-03 DIAGNOSIS — S91009A Unspecified open wound, unspecified ankle, initial encounter: Secondary | ICD-10-CM

## 2014-01-03 DIAGNOSIS — I10 Essential (primary) hypertension: Secondary | ICD-10-CM | POA: Insufficient documentation

## 2014-01-03 DIAGNOSIS — S81809A Unspecified open wound, unspecified lower leg, initial encounter: Secondary | ICD-10-CM

## 2014-01-03 DIAGNOSIS — Y929 Unspecified place or not applicable: Secondary | ICD-10-CM | POA: Insufficient documentation

## 2014-01-03 DIAGNOSIS — S99919A Unspecified injury of unspecified ankle, initial encounter: Secondary | ICD-10-CM | POA: Diagnosis not present

## 2014-01-03 DIAGNOSIS — Z862 Personal history of diseases of the blood and blood-forming organs and certain disorders involving the immune mechanism: Secondary | ICD-10-CM | POA: Insufficient documentation

## 2014-01-03 DIAGNOSIS — S59909A Unspecified injury of unspecified elbow, initial encounter: Secondary | ICD-10-CM | POA: Diagnosis not present

## 2014-01-03 DIAGNOSIS — IMO0002 Reserved for concepts with insufficient information to code with codable children: Secondary | ICD-10-CM

## 2014-01-03 DIAGNOSIS — E7800 Pure hypercholesterolemia, unspecified: Secondary | ICD-10-CM | POA: Insufficient documentation

## 2014-01-03 DIAGNOSIS — S0993XA Unspecified injury of face, initial encounter: Secondary | ICD-10-CM | POA: Diagnosis not present

## 2014-01-03 DIAGNOSIS — Z79899 Other long term (current) drug therapy: Secondary | ICD-10-CM | POA: Insufficient documentation

## 2014-01-03 DIAGNOSIS — S79919A Unspecified injury of unspecified hip, initial encounter: Secondary | ICD-10-CM | POA: Diagnosis not present

## 2014-01-03 DIAGNOSIS — I2510 Atherosclerotic heart disease of native coronary artery without angina pectoris: Secondary | ICD-10-CM | POA: Diagnosis not present

## 2014-01-03 DIAGNOSIS — S8990XA Unspecified injury of unspecified lower leg, initial encounter: Secondary | ICD-10-CM | POA: Diagnosis not present

## 2014-01-03 DIAGNOSIS — S81009A Unspecified open wound, unspecified knee, initial encounter: Secondary | ICD-10-CM | POA: Insufficient documentation

## 2014-01-03 DIAGNOSIS — S199XXA Unspecified injury of neck, initial encounter: Secondary | ICD-10-CM | POA: Diagnosis not present

## 2014-01-03 DIAGNOSIS — Z7982 Long term (current) use of aspirin: Secondary | ICD-10-CM | POA: Diagnosis not present

## 2014-01-03 DIAGNOSIS — W19XXXA Unspecified fall, initial encounter: Secondary | ICD-10-CM

## 2014-01-03 HISTORY — DX: Pure hypercholesterolemia, unspecified: E78.00

## 2014-01-03 HISTORY — DX: Anemia, unspecified: D64.9

## 2014-01-03 HISTORY — DX: Essential (primary) hypertension: I10

## 2014-01-03 LAB — BASIC METABOLIC PANEL
BUN: 14 mg/dL (ref 6–23)
CO2: 26 meq/L (ref 19–32)
CREATININE: 0.82 mg/dL (ref 0.50–1.10)
Calcium: 9.1 mg/dL (ref 8.4–10.5)
Chloride: 104 mEq/L (ref 96–112)
GFR calc Af Amer: 75 mL/min — ABNORMAL LOW (ref >90)
GFR calc non Af Amer: 65 mL/min — ABNORMAL LOW (ref >90)
Glucose, Bld: 97 mg/dL (ref 70–99)
Potassium: 4.2 mEq/L (ref 3.7–5.3)
Sodium: 141 mEq/L (ref 137–147)

## 2014-01-03 LAB — CBC WITH DIFFERENTIAL/PLATELET
BASOS ABS: 0 10*3/uL (ref 0.0–0.1)
BASOS PCT: 0 % (ref 0–1)
EOS PCT: 1 % (ref 0–5)
Eosinophils Absolute: 0 10*3/uL (ref 0.0–0.7)
HEMATOCRIT: 38.2 % (ref 36.0–46.0)
Hemoglobin: 12.8 g/dL (ref 12.0–15.0)
LYMPHS PCT: 33 % (ref 12–46)
Lymphs Abs: 1.5 10*3/uL (ref 0.7–4.0)
MCH: 32.2 pg (ref 26.0–34.0)
MCHC: 33.5 g/dL (ref 30.0–36.0)
MCV: 96 fL (ref 78.0–100.0)
MONO ABS: 0.6 10*3/uL (ref 0.1–1.0)
Monocytes Relative: 13 % — ABNORMAL HIGH (ref 3–12)
Neutro Abs: 2.4 10*3/uL (ref 1.7–7.7)
Neutrophils Relative %: 53 % (ref 43–77)
PLATELETS: 166 10*3/uL (ref 150–400)
RBC: 3.98 MIL/uL (ref 3.87–5.11)
RDW: 13.8 % (ref 11.5–15.5)
WBC: 4.5 10*3/uL (ref 4.0–10.5)

## 2014-01-03 LAB — URINALYSIS, ROUTINE W REFLEX MICROSCOPIC
BILIRUBIN URINE: NEGATIVE
GLUCOSE, UA: NEGATIVE mg/dL
Hgb urine dipstick: NEGATIVE
KETONES UR: NEGATIVE mg/dL
Leukocytes, UA: NEGATIVE
Nitrite: NEGATIVE
Protein, ur: NEGATIVE mg/dL
Specific Gravity, Urine: 1.008 (ref 1.005–1.030)
Urobilinogen, UA: 0.2 mg/dL (ref 0.0–1.0)
pH: 7 (ref 5.0–8.0)

## 2014-01-03 MED ORDER — CEFAZOLIN SODIUM 1-5 GM-% IV SOLN
1.0000 g | Freq: Once | INTRAVENOUS | Status: AC
  Administered 2014-01-03: 1 g via INTRAVENOUS
  Filled 2014-01-03: qty 50

## 2014-01-03 MED ORDER — TETANUS-DIPHTH-ACELL PERTUSSIS 5-2.5-18.5 LF-MCG/0.5 IM SUSP
0.5000 mL | Freq: Once | INTRAMUSCULAR | Status: AC
  Administered 2014-01-03: 0.5 mL via INTRAMUSCULAR
  Filled 2014-01-03: qty 0.5

## 2014-01-03 MED ORDER — ONDANSETRON HCL 4 MG/2ML IJ SOLN
4.0000 mg | Freq: Once | INTRAMUSCULAR | Status: AC
  Administered 2014-01-03: 4 mg via INTRAVENOUS
  Filled 2014-01-03: qty 2

## 2014-01-03 MED ORDER — OXYCODONE-ACETAMINOPHEN 5-325 MG PO TABS: 1.0000 | ORAL_TABLET | Freq: Four times a day (QID) | ORAL | Status: DC | PRN

## 2014-01-03 MED ORDER — CEFTRIAXONE SODIUM 1 G IJ SOLR: 1.0000 g | Freq: Once | INTRAMUSCULAR | Status: DC

## 2014-01-03 MED ORDER — OXYCODONE-ACETAMINOPHEN 5-325 MG PO TABS
1.0000 | ORAL_TABLET | Freq: Once | ORAL | Status: AC
  Administered 2014-01-03: 1 via ORAL
  Filled 2014-01-03: qty 1

## 2014-01-03 MED ORDER — FENTANYL CITRATE 0.05 MG/ML IJ SOLN
50.0000 ug | Freq: Once | INTRAMUSCULAR | Status: AC
  Administered 2014-01-03: 50 ug via INTRAVENOUS
  Filled 2014-01-03: qty 2

## 2014-01-03 MED ORDER — METHYLENE BLUE 1 % INJ SOLN: 1.0000 mL | Freq: Once | INTRAMUSCULAR | Status: DC

## 2014-01-03 MED ORDER — ONDANSETRON 4 MG PO TBDP: 4.0000 mg | ORAL_TABLET | Freq: Three times a day (TID) | ORAL | Status: DC | PRN

## 2014-01-03 MED ORDER — CEPHALEXIN 500 MG PO CAPS: 500.0000 mg | ORAL_CAPSULE | Freq: Four times a day (QID) | ORAL | Status: DC

## 2014-01-03 NOTE — ED Provider Notes (Addendum)
TIME SEEN: 1:07 PM  CHIEF COMPLAINT: Vertigo, fall  HPI: Patient is a 80 y.o. female with a history of CAD, spinal stenosis, hypertension, hyperlipidemia, anemia who has had intermittent vertigo who presents emergency department after a fall today. Patient reports that she started having intermittent vertigo yesterday it is worse with movement of her head. She reports she had an episode of feeling like the room is spinning and this caused her to fall. She is not sure if she hit her head. Denies any chest pain, shortness of breath, palpitations that led to her fall. No recent fever, cough, vomiting or diarrhea. No bloody stools or melena.  She denies any numbness, tingling or focal weakness. Patient has a large laceration to her right medial knee. She is unsure when her last tetanus vaccination was. She is also complaining of right hip pain, left wrist pain, left anterior shin pain. She is not on anticoagulation.  ROS: See HPI Constitutional: no fever  Eyes: no drainage  ENT: no runny nose   Cardiovascular:  no chest pain  Resp: no SOB  GI: no vomiting GU: no dysuria Integumentary: no rash  Allergy: no hives  Musculoskeletal: no leg swelling  Neurological: no slurred speech ROS otherwise negative  PAST MEDICAL HISTORY/PAST SURGICAL HISTORY:  Past Medical History  Diagnosis Date  . CAD (coronary artery disease)   . Spinal stenosis   . Acid reflux   . Arthritis   . Osteoporosis   . Hyperlipidemia   . High cholesterol   . Hypertension   . Anemia     MEDICATIONS:  Prior to Admission medications   Medication Sig Start Date End Date Taking? Authorizing Provider  acetaminophen (TYLENOL) 500 MG tablet Take 1,000 mg by mouth at bedtime.    Yes Historical Provider, MD  acetaminophen (TYLENOL) 500 MG tablet Take 1,000 mg by mouth every 4 (four) hours as needed for moderate pain.   Yes Historical Provider, MD  aspirin EC 81 MG tablet Take 81 mg by mouth every morning.   Yes Historical  Provider, MD  denosumab (PROLIA) 60 MG/ML SOLN injection Inject 60 mg into the skin every 6 (six) months. Administer in upper arm, thigh, or abdomen   Yes Historical Provider, MD  docusate sodium (COLACE) 100 MG capsule Take 200 mg by mouth 2 (two) times daily.    Yes Historical Provider, MD  DULoxetine (CYMBALTA) 30 MG capsule Take 30 mg by mouth daily.   Yes Historical Provider, MD  esomeprazole (NEXIUM) 40 MG capsule Take 40 mg by mouth daily. Takes 1 time in the morning, and take another at night if needed.   Yes Historical Provider, MD  Glucosamine-Chondroitin (OSTEO BI-FLEX REGULAR STRENGTH PO) Take 1 capsule by mouth 2 (two) times daily.   Yes Historical Provider, MD  isosorbide mononitrate (IMDUR) 60 MG 24 hr tablet Take 60 mg by mouth every evening. Do not take if BP<100/60   Yes Historical Provider, MD  metoprolol succinate (TOPROL-XL) 25 MG 24 hr tablet Take 25 mg by mouth every evening. Do not take if BP<100/60   Yes Historical Provider, MD  nitroGLYCERIN (NITROSTAT) 0.4 MG SL tablet Place 0.4 mg under the tongue every 5 (five) minutes as needed for chest pain.   Yes Historical Provider, MD  omega-3 acid ethyl esters (LOVAZA) 1 G capsule Take 1 g by mouth 2 (two) times daily.   Yes Historical Provider, MD  OVER THE COUNTER MEDICATION Take 2 tablets by mouth 2 (two) times daily. Herb-Lax  Yes Historical Provider, MD  polysaccharide iron (NIFEREX) 150 MG CAPS capsule Take 150 mg by mouth every morning.   Yes Historical Provider, MD  Probiotic Product (ALIGN) 4 MG CAPS Take 1 tablet by mouth every morning.   Yes Historical Provider, MD  ranolazine (RANEXA) 500 MG 12 hr tablet Take 500 mg by mouth 2 (two) times daily.   Yes Historical Provider, MD  rosuvastatin (CRESTOR) 20 MG tablet Take 20 mg by mouth at bedtime.   Yes Historical Provider, MD  traMADol (ULTRAM) 50 MG tablet Take 50 mg by mouth every 6 (six) hours as needed (pain).   Yes Historical Provider, MD    ALLERGIES:  Allergies   Allergen Reactions  . Morphine And Related Shortness Of Breath    Chest pain  . Hydromorphone Nausea And Vomiting    Sensitive  . Codeine Nausea And Vomiting  . Evista [Raloxifene]     Upset stomach  . Hydrocodone Nausea And Vomiting  . Sulfa Antibiotics Nausea And Vomiting    SOCIAL HISTORY:  History  Substance Use Topics  . Smoking status: Never Smoker   . Smokeless tobacco: Not on file  . Alcohol Use: No    FAMILY HISTORY: No family history on file.  EXAM: There were no vitals taken for this visit. CONSTITUTIONAL: Alert and oriented and responds appropriately to questions. Well-appearing; well-nourished; GCS 15 HEAD: Normocephalic; atraumatic EYES: Conjunctivae clear, PERRL, EOMI ENT: normal nose; no rhinorrhea; moist mucous membranes; pharynx without lesions noted; no dental injury; no hemotypanum; TMs are clear bilaterally, mild impaction of cerumen in the left ear canal, no septal hematoma NECK: Supple, no meningismus, no LAD; no midline spinal tenderness, step-off or deformity CARD: RRR; S1 and S2 appreciated; no murmurs, no clicks, no rubs, no gallops RESP: Normal chest excursion without splinting or tachypnea; breath sounds clear and equal bilaterally; no wheezes, no rhonchi, no rales; chest wall stable, nontender to palpation ABD/GI: Normal bowel sounds; non-distended; soft, non-tender, no rebound, no guarding PELVIS:  stable, nontender to palpation BACK:  The back appears normal and is non-tender to palpation, there is no CVA tenderness; no midline spinal tenderness, step-off or deformity EXT: Patient has a 10 cm laceration to the medial left knee with bone exposed; patient is tender to palpation over the right lateral hip with no leg shortening or rotation, tender to palpation over the left scaphoid, tender palpation over the left tibia distally with mild ecchymosis, Normal ROM in all joints; otherwise extremities are non-tender to palpation; no edema; normal  capillary refill; no cyanosis    SKIN: Normal color for age and race; warm NEURO: Moves all extremities equally; strength 5/5 in all 4 extremities, sensation to light touch intact diffusely, cranial nerves II through XII intact, no dysmetria to finger to nose testing bilaterally PSYCH: The patient's mood and manner are appropriate. Grooming and personal hygiene are appropriate.  MEDICAL DECISION MAKING: Patient here with a fall due to vertigo which appears to be peripheral. She is neurologically intact. She has injured her right knee, right hip, left wrist and left tibia. She is not sure if she hit her head. Will obtain imaging of her head, neck, hip, knee, wrist and tibia/fibula. Will also obtain basic labs, urine. Will update tetanus vaccination. Given she has a large laceration over the medial joint with bone exposed, will inject joint with methylene blue to evaluate if any joint capsule involvement.  ED PROGRESS:  Patient's labs and imaging are unremarkable. Given pt TTP over left scaphoid  although no fracture, will place in thumb spica velcro splint.Urine pending. Discussed with Dr. Percell Miller with orthopedics who does not recommend injecting the joint given the large fluid to be injected with likely cause the patient significant pain. He recommends giving the patient IV antibiotics and discharging the patient on oral antibiotics. Given the wound is large and I am unable to approximate the edges and he would like a good closure of the wound, he recommends discussing with plastic surgery.   4:26 PM  Discussed again with orthopedics. There is no plastic surgery on call. On reevaluation of patient's wound, the bone exposed appears to be the patella with tunneling under the adipose tissue over the joint line.  Wound was anesthetized with approximately 10 mL of 2% lidocaine with epinephrine.  Have attempted to close wound patient is better anesthetize I feel that I can approximate the wound edges.  Family  however now wants orthopedics to come to the emergency department to see the patient and repair the wound. They refused to allow the ED physician to repair the wound. Spoke with Dr. Percell Miller again and he will come to see the patient in the [facility] long emergency department.  [place], DO 01/03/14 [place], DO 01/03/14 [place], DO 01/03/14 1651

## 2014-01-03 NOTE — ED Notes (Addendum)
Per EMS, Pt, from home, c/o vertigo x 2 days and a stumble/fall this morning.  Denies new pain.  Pt has skin tear on R knee.  Hx of spinal stenosis.  Denies LOC.  Denies hitting head.  A & Ox4.

## 2014-01-03 NOTE — Discharge Instructions (Signed)

## 2014-01-03 NOTE — Consult Note (Signed)
ORTHOPAEDIC CONSULTATION  REQUESTING PHYSICIAN: Delice Bison Ward, DO  Chief Complaint: Right knee laceration  HPI: Molly [name] is a 80 y.o. female who complains of  Mechanical fall with R knee laceration  Past Medical History  Diagnosis Date  . CAD (coronary artery disease)   . Spinal stenosis   . Acid reflux   . Arthritis   . Osteoporosis   . Hyperlipidemia   . High cholesterol   . Hypertension   . Anemia    Past Surgical History  Procedure Laterality Date  . Carotid stent    . Abdominal hysterectomy     History   Social History  . Marital Status: Married    Spouse Name: N/A    Number of Children: N/A  . Years of Education: N/A   Social History Main Topics  . Smoking status: Never Smoker   . Smokeless tobacco: None  . Alcohol Use: No  . Drug Use: No  . Sexual Activity: None   Other Topics Concern  . None   Social History Narrative  . None   No family history on file. Allergies  Allergen Reactions  . Morphine And Related Shortness Of Breath    Chest pain  . Hydromorphone Nausea And Vomiting    Sensitive  . Codeine Nausea And Vomiting  . Evista [Raloxifene]     Upset stomach  . Hydrocodone Nausea And Vomiting  . Sulfa Antibiotics Nausea And Vomiting   Prior to Admission medications   Medication Sig Start Date End Date Taking? Authorizing Provider  acetaminophen (TYLENOL) 500 MG tablet Take 1,000 mg by mouth at bedtime.    Yes Historical Provider, MD  acetaminophen (TYLENOL) 500 MG tablet Take 1,000 mg by mouth every 4 (four) hours as needed for moderate pain.   Yes Historical Provider, MD  aspirin EC 81 MG tablet Take 81 mg by mouth every morning.   Yes Historical Provider, MD  denosumab (PROLIA) 60 MG/ML SOLN injection Inject 60 mg into the skin every 6 (six) months. Administer in upper arm, thigh, or abdomen   Yes Historical Provider, MD  docusate sodium (COLACE) 100 MG capsule Take 200 mg by mouth 2 (two) times daily.    Yes Historical  Provider, MD  DULoxetine (CYMBALTA) 30 MG capsule Take 30 mg by mouth daily.   Yes Historical Provider, MD  esomeprazole (NEXIUM) 40 MG capsule Take 40 mg by mouth daily. Takes 1 time in the morning, and take another at night if needed.   Yes Historical Provider, MD  Glucosamine-Chondroitin (OSTEO BI-FLEX REGULAR STRENGTH PO) Take 1 capsule by mouth 2 (two) times daily.   Yes Historical Provider, MD  isosorbide mononitrate (IMDUR) 60 MG 24 hr tablet Take 60 mg by mouth every evening. Do not take if BP<100/60   Yes Historical Provider, MD  metoprolol succinate (TOPROL-XL) 25 MG 24 hr tablet Take 25 mg by mouth every evening. Do not take if BP<100/60   Yes Historical Provider, MD  nitroGLYCERIN (NITROSTAT) 0.4 MG SL tablet Place 0.4 mg under the tongue every 5 (five) minutes as needed for chest pain.   Yes Historical Provider, MD  omega-3 acid ethyl esters (LOVAZA) 1 G capsule Take 1 g by mouth 2 (two) times daily.   Yes Historical Provider, MD  OVER THE COUNTER MEDICATION Take 2 tablets by mouth 2 (two) times daily. Herb-Lax   Yes Historical Provider, MD  polysaccharide iron (NIFEREX) 150 MG CAPS capsule Take 150 mg by mouth every morning.  Yes Historical Provider, MD  Probiotic Product (ALIGN) 4 MG CAPS Take 1 tablet by mouth every morning.   Yes Historical Provider, MD  ranolazine (RANEXA) 500 MG 12 hr tablet Take 500 mg by mouth 2 (two) times daily.   Yes Historical Provider, MD  rosuvastatin (CRESTOR) 20 MG tablet Take 20 mg by mouth at bedtime.   Yes Historical Provider, MD  traMADol (ULTRAM) 50 MG tablet Take 50 mg by mouth every 6 (six) hours as needed (pain).   Yes Historical Provider, MD   Dg Wrist Complete Left  01/03/2014   CLINICAL DATA:  Fall  EXAM: LEFT WRIST - COMPLETE 3+ VIEW  COMPARISON:  None.  FINDINGS: There is no evidence of fracture or dislocation. There is no evidence of arthropathy or other focal bone abnormality. Soft tissues are unremarkable. Degenerative change at the base  of the thumb.  IMPRESSION: Negative.   Electronically Signed   By: Franchot Gallo M.D.   On: 01/03/2014 14:28   Dg Hip Complete Right  01/03/2014   CLINICAL DATA:  Fall.  Right hip pain.  EXAM: RIGHT HIP - COMPLETE 2+ VIEW  COMPARISON:  None.  FINDINGS: Right hip is located. Calcifications lateral to the greater trochanter may be loose bodies within the trochanteric bursa. The pelvis demonstrates degenerative changes at the SI joints bilaterally. Joint space is preserved within the hips bilaterally. Degenerative changes are noted in the lower lumbar spine.  IMPRESSION: 1. No acute abnormality. 2. Calcifications may represent loose bodies within the trochanteric bursa.   Electronically Signed   By: Lawrence Santiago M.D.   On: 01/03/2014 14:43   Dg Tibia/fibula Left  01/03/2014   CLINICAL DATA:  Fall  EXAM: LEFT TIBIA AND FIBULA - 2 VIEW  COMPARISON:  None.  FINDINGS: There is no evidence of fracture or other focal bone lesions. Soft tissues are unremarkable. Arterial calcification noted.  IMPRESSION: Negative.   Electronically Signed   By: Franchot Gallo M.D.   On: 01/03/2014 14:28   Ct Head Wo Contrast  01/03/2014   CLINICAL DATA:  Fall. Did not hit head. Vertigo. Dizziness. Right-sided weakness. No neck pain.  EXAM: CT HEAD WITHOUT CONTRAST  CT CERVICAL SPINE WITHOUT CONTRAST  TECHNIQUE: Multidetector CT imaging of the head and cervical spine was performed following the standard protocol without intravenous contrast. Multiplanar CT image reconstructions of the cervical spine were also generated.  COMPARISON:  05/30/2007 head CT. 04/26/2007 head CT and cervical spine CT.  FINDINGS: CT HEAD FINDINGS  No skull fracture or intracranial hemorrhage.  Atrophy without hydrocephalus.  Small vessel disease type changes without CT evidence of large acute infarct.  Incidentally noted is a cavum septum pellucidum et vergae.  No intracranial mass lesion noted on this unenhanced exam.  Vascular calcifications.  CT CERVICAL  SPINE FINDINGS  No cervical spine fracture  Progressive anterior slip of C4 (4 mm) felt to be related to facet joint degenerative changes. If there is a high clinical suspicion of ligamentous injury, flexion and extension views or MR can be performed for further delineation.  Progressive degenerative changes C5-6 and C6-7.  No abnormal prevertebral soft tissue swelling.  Lung apices clear.  Left lobe of thyroid gland 1.8 cm lesion. This can be assessed with elective thyroid ultrasound.  IMPRESSION: No skull fracture or intracranial hemorrhage.  No cervical spine fracture with degenerative changes as detailed above.  1.8 cm left lobe of thyroid lesion can be assessed with elective thyroid ultrasound.   Electronically Signed  By: Chauncey Cruel M.D.   On: 01/03/2014 14:19   Ct Cervical Spine Wo Contrast  01/03/2014   CLINICAL DATA:  Fall. Did not hit head. Vertigo. Dizziness. Right-sided weakness. No neck pain.  EXAM: CT HEAD WITHOUT CONTRAST  CT CERVICAL SPINE WITHOUT CONTRAST  TECHNIQUE: Multidetector CT imaging of the head and cervical spine was performed following the standard protocol without intravenous contrast. Multiplanar CT image reconstructions of the cervical spine were also generated.  COMPARISON:  05/30/2007 head CT. 04/26/2007 head CT and cervical spine CT.  FINDINGS: CT HEAD FINDINGS  No skull fracture or intracranial hemorrhage.  Atrophy without hydrocephalus.  Small vessel disease type changes without CT evidence of large acute infarct.  Incidentally noted is a cavum septum pellucidum et vergae.  No intracranial mass lesion noted on this unenhanced exam.  Vascular calcifications.  CT CERVICAL SPINE FINDINGS  No cervical spine fracture  Progressive anterior slip of C4 (4 mm) felt to be related to facet joint degenerative changes. If there is a high clinical suspicion of ligamentous injury, flexion and extension views or MR can be performed for further delineation.  Progressive degenerative changes  C5-6 and C6-7.  No abnormal prevertebral soft tissue swelling.  Lung apices clear.  Left lobe of thyroid gland 1.8 cm lesion. This can be assessed with elective thyroid ultrasound.  IMPRESSION: No skull fracture or intracranial hemorrhage.  No cervical spine fracture with degenerative changes as detailed above.  1.8 cm left lobe of thyroid lesion can be assessed with elective thyroid ultrasound.   Electronically Signed   By: Chauncey Cruel M.D.   On: 01/03/2014 14:19   Dg Knee Complete 4 Views Right  01/03/2014   CLINICAL DATA:  Fall  EXAM: RIGHT KNEE - COMPLETE 4+ VIEW  COMPARISON:  None.  FINDINGS: There is no evidence of fracture, dislocation, or joint effusion. There is no evidence of arthropathy or other focal bone abnormality. Soft tissues are unremarkable.  IMPRESSION: Negative.   Electronically Signed   By: Franchot Gallo M.D.   On: 01/03/2014 14:36    Positive ROS: All other systems have been reviewed and were otherwise negative with the exception of those mentioned in the HPI and as above.  Labs cbc  Recent Labs  01/03/14 1330  WBC 4.5  HGB 12.8  HCT 38.2  PLT 166    Labs inflam No results found for this basename: ESR, CRP,  in the last 72 hours  Labs coag No results found for this basename: INR, PT, PTT,  in the last 72 hours   Recent Labs  01/03/14 1330  NA 141  K 4.2  CL 104  CO2 26  GLUCOSE 97  BUN 14  CREATININE 0.82  CALCIUM 9.1    Physical Exam: There were no vitals filed for this visit. General: Alert, no acute distress Cardiovascular: No pedal edema Respiratory: No cyanosis, no use of accessory musculature GI: No organomegaly, abdomen is soft and non-tender Skin: No lesions in the area of chief complaint Neurologic: Sensation intact distally Psychiatric: Patient is competent for consent with normal mood and affect Lymphatic: No axillary or cervical lymphadenopathy  MUSCULOSKELETAL:  Distally at her baseline NV status, 10cm laceration over anterior  knee with exposed periostium of patella. No exposed joint on probing.  Other extremities are atraumatic with painless ROM and NVI.  Assessment: R skin laceration  Plan: I performed a thurough irrigation and exploration, no gross contamination. I closed the wound loosely and applied a sterile dressing -  based on the wound location(over the patella) it is very unlikely that there was a traumatic arthrotomy. Given the associated risks for this patient with anesthesia I discussed with family weather a bedside washout or operative washout would be most prudent. Together we agreed that a bedside with abx and wound monitoring was the best course of action.  -one dose ancef in ED - d/c with keflex, knee immobilizer  Weight Bearing Status: WBAT F/u with me on Monday for a wound check   Edmonia Lynch, D, MD Cell [phone]   01/03/2014 5:28 PM

## 2014-01-03 NOTE — ED Notes (Signed)
Bed: HQ19 Expected date:  Expected time:  Means of arrival:  Comments: EMS- elderly, fall, vertigo

## 2014-01-03 NOTE — ED Provider Notes (Signed)
Laceration was closed by Dr. Percell Miller. Will start antibiotics and pain medicines. Will followup in the office.  Molly [name]. Alvino Chapel, MD 01/03/14 1935

## 2014-01-05 DIAGNOSIS — S81009A Unspecified open wound, unspecified knee, initial encounter: Secondary | ICD-10-CM | POA: Diagnosis not present

## 2014-01-05 DIAGNOSIS — S81809A Unspecified open wound, unspecified lower leg, initial encounter: Secondary | ICD-10-CM | POA: Diagnosis not present

## 2014-01-08 DIAGNOSIS — M25569 Pain in unspecified knee: Secondary | ICD-10-CM | POA: Diagnosis not present

## 2014-01-11 DIAGNOSIS — M4800 Spinal stenosis, site unspecified: Secondary | ICD-10-CM | POA: Diagnosis not present

## 2014-01-11 DIAGNOSIS — IMO0001 Reserved for inherently not codable concepts without codable children: Secondary | ICD-10-CM | POA: Diagnosis not present

## 2014-01-11 DIAGNOSIS — R609 Edema, unspecified: Secondary | ICD-10-CM | POA: Diagnosis not present

## 2014-01-11 DIAGNOSIS — M545 Low back pain, unspecified: Secondary | ICD-10-CM | POA: Diagnosis not present

## 2014-01-11 DIAGNOSIS — N183 Chronic kidney disease, stage 3 unspecified: Secondary | ICD-10-CM | POA: Diagnosis not present

## 2014-01-11 DIAGNOSIS — W19XXXA Unspecified fall, initial encounter: Secondary | ICD-10-CM | POA: Diagnosis not present

## 2014-01-15 DIAGNOSIS — R609 Edema, unspecified: Secondary | ICD-10-CM | POA: Diagnosis not present

## 2014-01-15 DIAGNOSIS — W19XXXA Unspecified fall, initial encounter: Secondary | ICD-10-CM | POA: Diagnosis not present

## 2014-01-15 DIAGNOSIS — N183 Chronic kidney disease, stage 3 unspecified: Secondary | ICD-10-CM | POA: Diagnosis not present

## 2014-01-15 DIAGNOSIS — M545 Low back pain, unspecified: Secondary | ICD-10-CM | POA: Diagnosis not present

## 2014-01-15 DIAGNOSIS — IMO0001 Reserved for inherently not codable concepts without codable children: Secondary | ICD-10-CM | POA: Diagnosis not present

## 2014-01-15 DIAGNOSIS — M4800 Spinal stenosis, site unspecified: Secondary | ICD-10-CM | POA: Diagnosis not present

## 2014-01-16 DIAGNOSIS — M25569 Pain in unspecified knee: Secondary | ICD-10-CM | POA: Diagnosis not present

## 2014-01-18 DIAGNOSIS — M4800 Spinal stenosis, site unspecified: Secondary | ICD-10-CM | POA: Diagnosis not present

## 2014-01-18 DIAGNOSIS — M545 Low back pain, unspecified: Secondary | ICD-10-CM | POA: Diagnosis not present

## 2014-01-18 DIAGNOSIS — W19XXXA Unspecified fall, initial encounter: Secondary | ICD-10-CM | POA: Diagnosis not present

## 2014-01-18 DIAGNOSIS — R609 Edema, unspecified: Secondary | ICD-10-CM | POA: Diagnosis not present

## 2014-01-18 DIAGNOSIS — IMO0001 Reserved for inherently not codable concepts without codable children: Secondary | ICD-10-CM | POA: Diagnosis not present

## 2014-01-18 DIAGNOSIS — N183 Chronic kidney disease, stage 3 unspecified: Secondary | ICD-10-CM | POA: Diagnosis not present

## 2014-01-22 DIAGNOSIS — M545 Low back pain, unspecified: Secondary | ICD-10-CM | POA: Diagnosis not present

## 2014-01-22 DIAGNOSIS — N183 Chronic kidney disease, stage 3 unspecified: Secondary | ICD-10-CM | POA: Diagnosis not present

## 2014-01-22 DIAGNOSIS — W19XXXA Unspecified fall, initial encounter: Secondary | ICD-10-CM | POA: Diagnosis not present

## 2014-01-22 DIAGNOSIS — M4800 Spinal stenosis, site unspecified: Secondary | ICD-10-CM | POA: Diagnosis not present

## 2014-01-22 DIAGNOSIS — R609 Edema, unspecified: Secondary | ICD-10-CM | POA: Diagnosis not present

## 2014-01-22 DIAGNOSIS — IMO0001 Reserved for inherently not codable concepts without codable children: Secondary | ICD-10-CM | POA: Diagnosis not present

## 2014-01-25 DIAGNOSIS — M545 Low back pain, unspecified: Secondary | ICD-10-CM | POA: Diagnosis not present

## 2014-01-25 DIAGNOSIS — IMO0001 Reserved for inherently not codable concepts without codable children: Secondary | ICD-10-CM | POA: Diagnosis not present

## 2014-01-25 DIAGNOSIS — N183 Chronic kidney disease, stage 3 unspecified: Secondary | ICD-10-CM | POA: Diagnosis not present

## 2014-01-25 DIAGNOSIS — W19XXXA Unspecified fall, initial encounter: Secondary | ICD-10-CM | POA: Diagnosis not present

## 2014-01-25 DIAGNOSIS — M4800 Spinal stenosis, site unspecified: Secondary | ICD-10-CM | POA: Diagnosis not present

## 2014-01-25 DIAGNOSIS — R609 Edema, unspecified: Secondary | ICD-10-CM | POA: Diagnosis not present

## 2014-01-29 DIAGNOSIS — M545 Low back pain, unspecified: Secondary | ICD-10-CM | POA: Diagnosis not present

## 2014-01-29 DIAGNOSIS — R609 Edema, unspecified: Secondary | ICD-10-CM | POA: Diagnosis not present

## 2014-01-29 DIAGNOSIS — N183 Chronic kidney disease, stage 3 unspecified: Secondary | ICD-10-CM | POA: Diagnosis not present

## 2014-01-29 DIAGNOSIS — M4800 Spinal stenosis, site unspecified: Secondary | ICD-10-CM | POA: Diagnosis not present

## 2014-01-29 DIAGNOSIS — IMO0001 Reserved for inherently not codable concepts without codable children: Secondary | ICD-10-CM | POA: Diagnosis not present

## 2014-01-29 DIAGNOSIS — W19XXXA Unspecified fall, initial encounter: Secondary | ICD-10-CM | POA: Diagnosis not present

## 2014-01-30 DIAGNOSIS — M25569 Pain in unspecified knee: Secondary | ICD-10-CM | POA: Diagnosis not present

## 2014-02-01 DIAGNOSIS — M545 Low back pain, unspecified: Secondary | ICD-10-CM | POA: Diagnosis not present

## 2014-02-01 DIAGNOSIS — R609 Edema, unspecified: Secondary | ICD-10-CM | POA: Diagnosis not present

## 2014-02-01 DIAGNOSIS — N183 Chronic kidney disease, stage 3 unspecified: Secondary | ICD-10-CM | POA: Diagnosis not present

## 2014-02-01 DIAGNOSIS — M4800 Spinal stenosis, site unspecified: Secondary | ICD-10-CM | POA: Diagnosis not present

## 2014-02-01 DIAGNOSIS — W19XXXA Unspecified fall, initial encounter: Secondary | ICD-10-CM | POA: Diagnosis not present

## 2014-02-01 DIAGNOSIS — IMO0001 Reserved for inherently not codable concepts without codable children: Secondary | ICD-10-CM | POA: Diagnosis not present

## 2014-02-02 DIAGNOSIS — W19XXXA Unspecified fall, initial encounter: Secondary | ICD-10-CM | POA: Diagnosis not present

## 2014-02-02 DIAGNOSIS — M545 Low back pain, unspecified: Secondary | ICD-10-CM | POA: Diagnosis not present

## 2014-02-02 DIAGNOSIS — IMO0001 Reserved for inherently not codable concepts without codable children: Secondary | ICD-10-CM | POA: Diagnosis not present

## 2014-02-02 DIAGNOSIS — R609 Edema, unspecified: Secondary | ICD-10-CM | POA: Diagnosis not present

## 2014-02-02 DIAGNOSIS — N183 Chronic kidney disease, stage 3 unspecified: Secondary | ICD-10-CM | POA: Diagnosis not present

## 2014-02-02 DIAGNOSIS — M4800 Spinal stenosis, site unspecified: Secondary | ICD-10-CM | POA: Diagnosis not present

## 2014-02-05 DIAGNOSIS — M545 Low back pain, unspecified: Secondary | ICD-10-CM | POA: Diagnosis not present

## 2014-02-05 DIAGNOSIS — N183 Chronic kidney disease, stage 3 unspecified: Secondary | ICD-10-CM | POA: Diagnosis not present

## 2014-02-05 DIAGNOSIS — M4800 Spinal stenosis, site unspecified: Secondary | ICD-10-CM | POA: Diagnosis not present

## 2014-02-05 DIAGNOSIS — W19XXXA Unspecified fall, initial encounter: Secondary | ICD-10-CM | POA: Diagnosis not present

## 2014-02-05 DIAGNOSIS — IMO0001 Reserved for inherently not codable concepts without codable children: Secondary | ICD-10-CM | POA: Diagnosis not present

## 2014-02-05 DIAGNOSIS — R609 Edema, unspecified: Secondary | ICD-10-CM | POA: Diagnosis not present

## 2014-02-08 DIAGNOSIS — R609 Edema, unspecified: Secondary | ICD-10-CM | POA: Diagnosis not present

## 2014-02-08 DIAGNOSIS — M4800 Spinal stenosis, site unspecified: Secondary | ICD-10-CM | POA: Diagnosis not present

## 2014-02-08 DIAGNOSIS — IMO0001 Reserved for inherently not codable concepts without codable children: Secondary | ICD-10-CM | POA: Diagnosis not present

## 2014-02-08 DIAGNOSIS — W19XXXA Unspecified fall, initial encounter: Secondary | ICD-10-CM | POA: Diagnosis not present

## 2014-02-08 DIAGNOSIS — N183 Chronic kidney disease, stage 3 unspecified: Secondary | ICD-10-CM | POA: Diagnosis not present

## 2014-02-08 DIAGNOSIS — M545 Low back pain, unspecified: Secondary | ICD-10-CM | POA: Diagnosis not present

## 2014-02-12 DIAGNOSIS — R609 Edema, unspecified: Secondary | ICD-10-CM | POA: Diagnosis not present

## 2014-02-12 DIAGNOSIS — M4800 Spinal stenosis, site unspecified: Secondary | ICD-10-CM | POA: Diagnosis not present

## 2014-02-12 DIAGNOSIS — W19XXXA Unspecified fall, initial encounter: Secondary | ICD-10-CM | POA: Diagnosis not present

## 2014-02-12 DIAGNOSIS — N183 Chronic kidney disease, stage 3 unspecified: Secondary | ICD-10-CM | POA: Diagnosis not present

## 2014-02-12 DIAGNOSIS — M545 Low back pain, unspecified: Secondary | ICD-10-CM | POA: Diagnosis not present

## 2014-02-12 DIAGNOSIS — IMO0001 Reserved for inherently not codable concepts without codable children: Secondary | ICD-10-CM | POA: Diagnosis not present

## 2014-02-14 DIAGNOSIS — M48061 Spinal stenosis, lumbar region without neurogenic claudication: Secondary | ICD-10-CM | POA: Diagnosis not present

## 2014-02-14 DIAGNOSIS — M47817 Spondylosis without myelopathy or radiculopathy, lumbosacral region: Secondary | ICD-10-CM | POA: Diagnosis not present

## 2014-02-14 DIAGNOSIS — M5137 Other intervertebral disc degeneration, lumbosacral region: Secondary | ICD-10-CM | POA: Diagnosis not present

## 2014-02-14 DIAGNOSIS — Q762 Congenital spondylolisthesis: Secondary | ICD-10-CM | POA: Diagnosis not present

## 2014-02-15 DIAGNOSIS — M545 Low back pain, unspecified: Secondary | ICD-10-CM | POA: Diagnosis not present

## 2014-02-15 DIAGNOSIS — N183 Chronic kidney disease, stage 3 unspecified: Secondary | ICD-10-CM | POA: Diagnosis not present

## 2014-02-15 DIAGNOSIS — M4800 Spinal stenosis, site unspecified: Secondary | ICD-10-CM | POA: Diagnosis not present

## 2014-02-15 DIAGNOSIS — R609 Edema, unspecified: Secondary | ICD-10-CM | POA: Diagnosis not present

## 2014-02-15 DIAGNOSIS — W19XXXA Unspecified fall, initial encounter: Secondary | ICD-10-CM | POA: Diagnosis not present

## 2014-02-15 DIAGNOSIS — IMO0001 Reserved for inherently not codable concepts without codable children: Secondary | ICD-10-CM | POA: Diagnosis not present

## 2014-02-19 DIAGNOSIS — N183 Chronic kidney disease, stage 3 unspecified: Secondary | ICD-10-CM | POA: Diagnosis not present

## 2014-02-19 DIAGNOSIS — M545 Low back pain, unspecified: Secondary | ICD-10-CM | POA: Diagnosis not present

## 2014-02-19 DIAGNOSIS — W19XXXA Unspecified fall, initial encounter: Secondary | ICD-10-CM | POA: Diagnosis not present

## 2014-02-19 DIAGNOSIS — M4800 Spinal stenosis, site unspecified: Secondary | ICD-10-CM | POA: Diagnosis not present

## 2014-02-19 DIAGNOSIS — IMO0001 Reserved for inherently not codable concepts without codable children: Secondary | ICD-10-CM | POA: Diagnosis not present

## 2014-02-19 DIAGNOSIS — R609 Edema, unspecified: Secondary | ICD-10-CM | POA: Diagnosis not present

## 2014-02-22 DIAGNOSIS — R609 Edema, unspecified: Secondary | ICD-10-CM | POA: Diagnosis not present

## 2014-02-22 DIAGNOSIS — M4800 Spinal stenosis, site unspecified: Secondary | ICD-10-CM | POA: Diagnosis not present

## 2014-02-22 DIAGNOSIS — M545 Low back pain, unspecified: Secondary | ICD-10-CM | POA: Diagnosis not present

## 2014-02-22 DIAGNOSIS — W19XXXA Unspecified fall, initial encounter: Secondary | ICD-10-CM | POA: Diagnosis not present

## 2014-02-22 DIAGNOSIS — IMO0001 Reserved for inherently not codable concepts without codable children: Secondary | ICD-10-CM | POA: Diagnosis not present

## 2014-02-22 DIAGNOSIS — N183 Chronic kidney disease, stage 3 unspecified: Secondary | ICD-10-CM | POA: Diagnosis not present

## 2014-02-26 ENCOUNTER — Telehealth: Payer: Self-pay | Admitting: Interventional Cardiology

## 2014-02-26 DIAGNOSIS — I2510 Atherosclerotic heart disease of native coronary artery without angina pectoris: Secondary | ICD-10-CM | POA: Diagnosis not present

## 2014-02-26 DIAGNOSIS — N183 Chronic kidney disease, stage 3 unspecified: Secondary | ICD-10-CM | POA: Diagnosis not present

## 2014-02-26 DIAGNOSIS — H6120 Impacted cerumen, unspecified ear: Secondary | ICD-10-CM | POA: Diagnosis not present

## 2014-02-26 DIAGNOSIS — I10 Essential (primary) hypertension: Secondary | ICD-10-CM | POA: Diagnosis not present

## 2014-02-26 DIAGNOSIS — R269 Unspecified abnormalities of gait and mobility: Secondary | ICD-10-CM | POA: Diagnosis not present

## 2014-02-26 DIAGNOSIS — D649 Anemia, unspecified: Secondary | ICD-10-CM | POA: Diagnosis not present

## 2014-02-26 DIAGNOSIS — E7800 Pure hypercholesterolemia, unspecified: Secondary | ICD-10-CM | POA: Diagnosis not present

## 2014-02-26 DIAGNOSIS — K219 Gastro-esophageal reflux disease without esophagitis: Secondary | ICD-10-CM | POA: Diagnosis not present

## 2014-02-26 NOTE — Telephone Encounter (Signed)
Called pts daughter and went over appts with her.

## 2014-02-26 NOTE — Telephone Encounter (Signed)
New problem   Pt need to know when is her next appt is due for Dr Irish Lack. Please call pt

## 2014-02-27 DIAGNOSIS — W19XXXA Unspecified fall, initial encounter: Secondary | ICD-10-CM | POA: Diagnosis not present

## 2014-02-27 DIAGNOSIS — R609 Edema, unspecified: Secondary | ICD-10-CM | POA: Diagnosis not present

## 2014-02-27 DIAGNOSIS — M4800 Spinal stenosis, site unspecified: Secondary | ICD-10-CM | POA: Diagnosis not present

## 2014-02-27 DIAGNOSIS — M545 Low back pain, unspecified: Secondary | ICD-10-CM | POA: Diagnosis not present

## 2014-02-27 DIAGNOSIS — IMO0001 Reserved for inherently not codable concepts without codable children: Secondary | ICD-10-CM | POA: Diagnosis not present

## 2014-02-27 DIAGNOSIS — N183 Chronic kidney disease, stage 3 unspecified: Secondary | ICD-10-CM | POA: Diagnosis not present

## 2014-03-01 DIAGNOSIS — M545 Low back pain, unspecified: Secondary | ICD-10-CM | POA: Diagnosis not present

## 2014-03-01 DIAGNOSIS — IMO0001 Reserved for inherently not codable concepts without codable children: Secondary | ICD-10-CM | POA: Diagnosis not present

## 2014-03-01 DIAGNOSIS — R609 Edema, unspecified: Secondary | ICD-10-CM | POA: Diagnosis not present

## 2014-03-01 DIAGNOSIS — M4800 Spinal stenosis, site unspecified: Secondary | ICD-10-CM | POA: Diagnosis not present

## 2014-03-01 DIAGNOSIS — W19XXXA Unspecified fall, initial encounter: Secondary | ICD-10-CM | POA: Diagnosis not present

## 2014-03-01 DIAGNOSIS — N183 Chronic kidney disease, stage 3 unspecified: Secondary | ICD-10-CM | POA: Diagnosis not present

## 2014-03-19 DIAGNOSIS — M47817 Spondylosis without myelopathy or radiculopathy, lumbosacral region: Secondary | ICD-10-CM | POA: Diagnosis not present

## 2014-03-19 DIAGNOSIS — Z6826 Body mass index (BMI) 26.0-26.9, adult: Secondary | ICD-10-CM | POA: Diagnosis not present

## 2014-03-19 DIAGNOSIS — M48062 Spinal stenosis, lumbar region with neurogenic claudication: Secondary | ICD-10-CM | POA: Diagnosis not present

## 2014-03-19 DIAGNOSIS — M4310 Spondylolisthesis, site unspecified: Secondary | ICD-10-CM | POA: Diagnosis not present

## 2014-03-20 ENCOUNTER — Other Ambulatory Visit: Payer: Self-pay | Admitting: Interventional Cardiology

## 2014-04-06 ENCOUNTER — Encounter: Payer: Self-pay | Admitting: Interventional Cardiology

## 2014-04-08 ENCOUNTER — Other Ambulatory Visit: Payer: Self-pay | Admitting: Interventional Cardiology

## 2014-04-11 ENCOUNTER — Other Ambulatory Visit: Payer: Medicare Other

## 2014-04-26 ENCOUNTER — Ambulatory Visit: Payer: Medicare Other | Admitting: Interventional Cardiology

## 2014-04-26 ENCOUNTER — Other Ambulatory Visit: Payer: Medicare Other

## 2014-05-02 DIAGNOSIS — IMO0002 Reserved for concepts with insufficient information to code with codable children: Secondary | ICD-10-CM | POA: Diagnosis not present

## 2014-05-02 DIAGNOSIS — M48061 Spinal stenosis, lumbar region without neurogenic claudication: Secondary | ICD-10-CM | POA: Diagnosis not present

## 2014-05-02 DIAGNOSIS — M4310 Spondylolisthesis, site unspecified: Secondary | ICD-10-CM | POA: Diagnosis not present

## 2014-05-02 DIAGNOSIS — M47817 Spondylosis without myelopathy or radiculopathy, lumbosacral region: Secondary | ICD-10-CM | POA: Diagnosis not present

## 2014-05-04 ENCOUNTER — Other Ambulatory Visit: Payer: Self-pay | Admitting: Interventional Cardiology

## 2014-05-08 ENCOUNTER — Telehealth: Payer: Self-pay | Admitting: Interventional Cardiology

## 2014-05-08 NOTE — Telephone Encounter (Signed)
lmtrc

## 2014-05-08 NOTE — Telephone Encounter (Signed)
Returned pt's call.

## 2014-05-08 NOTE — Telephone Encounter (Signed)
New message ° ° ° ° ° ° ° ° ° °Pt returning nurses call °

## 2014-05-15 ENCOUNTER — Encounter: Payer: Self-pay | Admitting: Interventional Cardiology

## 2014-05-15 ENCOUNTER — Other Ambulatory Visit (INDEPENDENT_AMBULATORY_CARE_PROVIDER_SITE_OTHER): Payer: Medicare Other

## 2014-05-15 ENCOUNTER — Ambulatory Visit (INDEPENDENT_AMBULATORY_CARE_PROVIDER_SITE_OTHER): Payer: Medicare Other | Admitting: Interventional Cardiology

## 2014-05-15 VITALS — BP 164/99 | HR 78 | Ht 62.0 in | Wt 146.8 lb

## 2014-05-15 DIAGNOSIS — I2510 Atherosclerotic heart disease of native coronary artery without angina pectoris: Secondary | ICD-10-CM | POA: Diagnosis not present

## 2014-05-15 DIAGNOSIS — R42 Dizziness and giddiness: Secondary | ICD-10-CM

## 2014-05-15 DIAGNOSIS — E785 Hyperlipidemia, unspecified: Secondary | ICD-10-CM | POA: Diagnosis not present

## 2014-05-15 DIAGNOSIS — I10 Essential (primary) hypertension: Secondary | ICD-10-CM | POA: Diagnosis not present

## 2014-05-15 NOTE — Progress Notes (Signed)
Patient ID: Molly [name], female   DOB: 02-Sep-1929, 80 y.o.   MRN: [id number]    [place], [address] [place], [place]  [address] Phone: [phone] Fax:  [phone]  Date:  05/15/2014   ID:  Adore Kithcart, DOB [birth year], MRN [id number]  PCP:  Osborne Casco, MD      History of Present Illness: Molly [name] is a 80 y.o. female who has had CAD, prior BMS to RCA. She had a mild abnormality on her stress test in 2012. She has been managed medically.  Prior to her stent, she had a chest pain that would not resolve. She has had some shortlived chest discomfort. Episodes are less than 10 seconds. Her walking is limited by spinal stenosis. SHe had an epidural recently.  Her voltaren was stopped due to risk of bleeding and cardiac risk. Her back pain is worse but diclofenac did not completely get rid of her discomfort. She is not feel like going back on it would make her feel back to normal. Several days ago, she had prolonged left arm pain but did not seek attention. It resolved spontaneously. Her walking is limited due to her risk of falling. She had a significant fall several months ago where she required stitches and antibiotics.  Occasional dizziness with standing. Not associated with low blood pressure as her daughter has checked at the time of dizziness.      Wt Readings from Last 3 Encounters:  05/15/14 146 lb 12.8 oz (66.588 kg)  03/06/12 144 lb (65.318 kg)     Past Medical History  Diagnosis Date  . CAD (coronary artery disease)   . Spinal stenosis   . Acid reflux   . Arthritis   . Osteoporosis   . Hyperlipidemia   . High cholesterol   . Hypertension   . Anemia     Current Outpatient Prescriptions  Medication Sig Dispense Refill  . acetaminophen (TYLENOL) 500 MG tablet Take 2,000 mg by mouth daily.       Marland Kitchen aspirin EC 81 MG tablet Take 81 mg by mouth every morning.      . denosumab (PROLIA) 60 MG/ML SOLN injection Inject 60 mg into the skin  every 6 (six) months. Administer in upper arm, thigh, or abdomen      . docusate sodium (COLACE) 100 MG capsule Take 200 mg by mouth 2 (two) times daily.       . DULoxetine (CYMBALTA) 30 MG capsule Take 30 mg by mouth daily.      Marland Kitchen esomeprazole (NEXIUM) 40 MG capsule Take 40 mg by mouth daily. Takes 1 time in the morning, and take another at night if needed.      . Glucosamine-Chondroitin (OSTEO BI-FLEX REGULAR STRENGTH PO) Take 1 capsule by mouth 2 (two) times daily.      . isosorbide mononitrate (IMDUR) 60 MG 24 hr tablet TAKE 1 TABLET DAILY  90 tablet  1  . metoprolol succinate (TOPROL-XL) 25 MG 24 hr tablet Take 25 mg by mouth every evening. Do not take if BP<100/60      . Misc Natural Products (NARCOSOFT HERBAL LAX PO) Take by mouth.      . nitroGLYCERIN (NITROSTAT) 0.4 MG SL tablet Place 0.4 mg under the tongue every 5 (five) minutes as needed for chest pain.      Marland Kitchen omega-3 acid ethyl esters (LOVAZA) 1 G capsule TAKE 1 CAPSULE TWICE A DAY  180 capsule  1  . ondansetron (ZOFRAN-ODT) 4 MG disintegrating  tablet Take 1 tablet (4 mg total) by mouth every 8 (eight) hours as needed for nausea or vomiting.  10 tablet  0  . OVER THE COUNTER MEDICATION Take 2 tablets by mouth 2 (two) times daily. Herb-Lax      . oxyCODONE-acetaminophen (PERCOCET/ROXICET) 5-325 MG per tablet Take 1 tablet by mouth every 6 (six) hours as needed for severe pain.  10 tablet  0  . polysaccharide iron (NIFEREX) 150 MG CAPS capsule Take 150 mg by mouth every morning.      . Probiotic Product (ALIGN) 4 MG CAPS Take 1 tablet by mouth every morning.      . ranolazine (RANEXA) 500 MG 12 hr tablet Take 500 mg by mouth 2 (two) times daily.      . rosuvastatin (CRESTOR) 20 MG tablet Take 20 mg by mouth at bedtime.      . traMADol (ULTRAM) 50 MG tablet Take 50 mg by mouth every 6 (six) hours as needed (pain).      . Turmeric 500 MG CAPS Take by mouth 4 (four) times daily.       No current facility-administered medications for this  visit.    Allergies:    Allergies  Allergen Reactions  . Morphine And Related Shortness Of Breath    Chest pain  . Hydromorphone Nausea And Vomiting    Sensitive  . Codeine Nausea And Vomiting  . Evista [Raloxifene]     Upset stomach  . Hydrocodone Nausea And Vomiting  . Sulfa Antibiotics Nausea And Vomiting    Social History:  The patient  reports that she has never smoked. She does not have any smokeless tobacco history on file. She reports that she does not drink alcohol or use illicit drugs.   Family History:  The patient's family history includes Anemia in her mother.   ROS:  Please see the history of present illness.  No nausea, vomiting.  No fevers, chills.  No focal weakness.  No dysuria. As above. Occasional dizziness.  All other systems reviewed and negative.   PHYSICAL EXAM: VS:  BP 164/99  Pulse 78  Ht 5\' 2"  (1.575 m)  Wt 146 lb 12.8 oz (66.588 kg)  BMI 26.84 kg/m2 Well nourished, well developed, in no acute distress HEENT: normal Neck: no JVD, no carotid bruits Cardiac:  normal S1, S2; RRR;  Lungs:  clear to auscultation bilaterally, no wheezing, rhonchi or rales Abd: soft, nontender, no hepatomegaly Ext: no edema Skin: warm and dry Neuro:   no focal abnormalities noted  EKG:  Normal sinus rhythm, septal Q waves, no ST segment changes   ASSESSMENT AND PLAN:  Coronary atherosclerosis of native coronary artery  Refill Nitroglycerin Tablet Sublingual, 0.4 MG, 1 tablet under the tongue, Sublingual, as needed, 30 days, 25, Refills 3      Notes: No angina.  Continue aggressive secondary prevention. OK to use exercise bike from cardiac standpoint.  I stressed the importance of doing exercise but would not put her at risk of falling.  2. Essential hypertension, benign  Notes: Well controlled on home readings.  If she starts to have low readings, would decrease metoprolol, especially given her occasional dizziness.  3. Pure hypercholesterolemia  Continue  Crestor Tablet, 20MG , TAKE 1 TABLET AT BEDTIME Continue Lovaza Capsule, 1GM, 1 capsule, Orally, Twice a day LAB: Statin Panel ordered for today  Notes: Lipids Reviewed and LDL increased off of crestor in the past.   Signed, Mina Marble, MD, [facility] 05/15/2014 5:26 PM

## 2014-05-15 NOTE — Patient Instructions (Signed)
Your physician recommends that you continue on your current medications as directed. Please refer to the Current Medication list given to you today.  Your physician wants you to follow-up in: 1 year with Dr. Varanasi. You will receive a reminder letter in the mail two months in advance. If you don't receive a letter, please call our office to schedule the follow-up appointment.  

## 2014-05-16 LAB — LIPID PANEL
CHOL/HDL RATIO: 2
Cholesterol: 194 mg/dL (ref 0–200)
HDL: 88.8 mg/dL (ref >39.00)
LDL CALC: 89 mg/dL (ref 0–99)
NONHDL: 105.2
Triglycerides: 83 mg/dL (ref 0.0–149.0)
VLDL: 16.6 mg/dL (ref 0.0–40.0)

## 2014-05-16 LAB — HEPATIC FUNCTION PANEL
ALBUMIN: 3.6 g/dL (ref 3.5–5.2)
ALT: 17 U/L (ref 0–35)
AST: 26 U/L (ref 0–37)
Alkaline Phosphatase: 27 U/L — ABNORMAL LOW (ref 39–117)
Bilirubin, Direct: 0.1 mg/dL (ref 0.0–0.3)
Total Bilirubin: 0.4 mg/dL (ref 0.2–1.2)
Total Protein: 6.4 g/dL (ref 6.0–8.3)

## 2014-05-17 ENCOUNTER — Other Ambulatory Visit: Payer: Medicare Other

## 2014-05-17 ENCOUNTER — Ambulatory Visit: Payer: Medicare Other | Admitting: Interventional Cardiology

## 2014-05-20 DIAGNOSIS — D509 Iron deficiency anemia, unspecified: Secondary | ICD-10-CM | POA: Diagnosis not present

## 2014-05-20 DIAGNOSIS — I2510 Atherosclerotic heart disease of native coronary artery without angina pectoris: Secondary | ICD-10-CM | POA: Diagnosis not present

## 2014-05-20 DIAGNOSIS — K219 Gastro-esophageal reflux disease without esophagitis: Secondary | ICD-10-CM | POA: Diagnosis not present

## 2014-05-20 DIAGNOSIS — E7800 Pure hypercholesterolemia, unspecified: Secondary | ICD-10-CM | POA: Diagnosis not present

## 2014-05-20 DIAGNOSIS — I10 Essential (primary) hypertension: Secondary | ICD-10-CM | POA: Diagnosis not present

## 2014-05-20 DIAGNOSIS — M5430 Sciatica, unspecified side: Secondary | ICD-10-CM | POA: Diagnosis not present

## 2014-05-20 DIAGNOSIS — M810 Age-related osteoporosis without current pathological fracture: Secondary | ICD-10-CM | POA: Diagnosis not present

## 2014-05-20 DIAGNOSIS — N183 Chronic kidney disease, stage 3 unspecified: Secondary | ICD-10-CM | POA: Diagnosis not present

## 2014-05-21 ENCOUNTER — Encounter: Payer: Self-pay | Admitting: Interventional Cardiology

## 2014-05-21 ENCOUNTER — Other Ambulatory Visit: Payer: Self-pay | Admitting: Cardiology

## 2014-05-21 DIAGNOSIS — E785 Hyperlipidemia, unspecified: Secondary | ICD-10-CM

## 2014-05-23 DIAGNOSIS — IMO0002 Reserved for concepts with insufficient information to code with codable children: Secondary | ICD-10-CM | POA: Diagnosis not present

## 2014-05-23 DIAGNOSIS — M545 Low back pain, unspecified: Secondary | ICD-10-CM | POA: Diagnosis not present

## 2014-05-23 DIAGNOSIS — M999 Biomechanical lesion, unspecified: Secondary | ICD-10-CM | POA: Diagnosis not present

## 2014-05-23 DIAGNOSIS — M25659 Stiffness of unspecified hip, not elsewhere classified: Secondary | ICD-10-CM | POA: Diagnosis not present

## 2014-06-15 ENCOUNTER — Other Ambulatory Visit: Payer: Self-pay | Admitting: Interventional Cardiology

## 2014-06-28 DIAGNOSIS — M5137 Other intervertebral disc degeneration, lumbosacral region: Secondary | ICD-10-CM | POA: Diagnosis not present

## 2014-06-28 DIAGNOSIS — M4310 Spondylolisthesis, site unspecified: Secondary | ICD-10-CM | POA: Diagnosis not present

## 2014-06-28 DIAGNOSIS — M48062 Spinal stenosis, lumbar region with neurogenic claudication: Secondary | ICD-10-CM | POA: Diagnosis not present

## 2014-06-28 DIAGNOSIS — M47817 Spondylosis without myelopathy or radiculopathy, lumbosacral region: Secondary | ICD-10-CM | POA: Diagnosis not present

## 2014-07-25 DIAGNOSIS — M47817 Spondylosis without myelopathy or radiculopathy, lumbosacral region: Secondary | ICD-10-CM | POA: Diagnosis not present

## 2014-07-25 DIAGNOSIS — M4310 Spondylolisthesis, site unspecified: Secondary | ICD-10-CM | POA: Diagnosis not present

## 2014-07-25 DIAGNOSIS — M5137 Other intervertebral disc degeneration, lumbosacral region: Secondary | ICD-10-CM | POA: Diagnosis not present

## 2014-07-25 DIAGNOSIS — M48061 Spinal stenosis, lumbar region without neurogenic claudication: Secondary | ICD-10-CM | POA: Diagnosis not present

## 2014-08-06 ENCOUNTER — Other Ambulatory Visit: Payer: Self-pay | Admitting: Interventional Cardiology

## 2014-08-16 ENCOUNTER — Other Ambulatory Visit: Payer: Self-pay

## 2014-08-16 DIAGNOSIS — Z961 Presence of intraocular lens: Secondary | ICD-10-CM | POA: Diagnosis not present

## 2014-08-16 DIAGNOSIS — Z1231 Encounter for screening mammogram for malignant neoplasm of breast: Secondary | ICD-10-CM

## 2014-08-16 DIAGNOSIS — H52209 Unspecified astigmatism, unspecified eye: Secondary | ICD-10-CM | POA: Diagnosis not present

## 2014-08-29 DIAGNOSIS — L565 Disseminated superficial actinic porokeratosis (DSAP): Secondary | ICD-10-CM | POA: Diagnosis not present

## 2014-08-29 DIAGNOSIS — L821 Other seborrheic keratosis: Secondary | ICD-10-CM | POA: Diagnosis not present

## 2014-08-29 DIAGNOSIS — L820 Inflamed seborrheic keratosis: Secondary | ICD-10-CM | POA: Diagnosis not present

## 2014-08-29 DIAGNOSIS — D485 Neoplasm of uncertain behavior of skin: Secondary | ICD-10-CM | POA: Diagnosis not present

## 2014-08-29 DIAGNOSIS — L570 Actinic keratosis: Secondary | ICD-10-CM | POA: Diagnosis not present

## 2014-08-29 DIAGNOSIS — Z85828 Personal history of other malignant neoplasm of skin: Secondary | ICD-10-CM | POA: Diagnosis not present

## 2014-09-03 ENCOUNTER — Ambulatory Visit: Payer: Medicare Other

## 2014-09-04 ENCOUNTER — Ambulatory Visit: Payer: Medicare Other

## 2014-09-04 DIAGNOSIS — Z23 Encounter for immunization: Secondary | ICD-10-CM | POA: Diagnosis not present

## 2014-09-11 ENCOUNTER — Ambulatory Visit
Admission: RE | Admit: 2014-09-11 | Discharge: 2014-09-11 | Disposition: A | Discharge status: Home / Self Care | Payer: Medicare Other | Source: Ambulatory Visit

## 2014-09-11 DIAGNOSIS — Z1231 Encounter for screening mammogram for malignant neoplasm of breast: Secondary | ICD-10-CM

## 2014-09-16 ENCOUNTER — Other Ambulatory Visit: Payer: Self-pay | Admitting: Interventional Cardiology

## 2014-09-27 DIAGNOSIS — M4806 Spinal stenosis, lumbar region: Secondary | ICD-10-CM | POA: Diagnosis not present

## 2014-09-27 DIAGNOSIS — M4726 Other spondylosis with radiculopathy, lumbar region: Secondary | ICD-10-CM | POA: Diagnosis not present

## 2014-09-27 DIAGNOSIS — Z6828 Body mass index (BMI) 28.0-28.9, adult: Secondary | ICD-10-CM | POA: Diagnosis not present

## 2014-09-27 DIAGNOSIS — M4316 Spondylolisthesis, lumbar region: Secondary | ICD-10-CM | POA: Diagnosis not present

## 2014-09-27 DIAGNOSIS — M5136 Other intervertebral disc degeneration, lumbar region: Secondary | ICD-10-CM | POA: Diagnosis not present

## 2014-10-12 ENCOUNTER — Other Ambulatory Visit: Payer: Self-pay | Admitting: Interventional Cardiology

## 2014-10-17 DIAGNOSIS — M4316 Spondylolisthesis, lumbar region: Secondary | ICD-10-CM | POA: Diagnosis not present

## 2014-10-17 DIAGNOSIS — M47816 Spondylosis without myelopathy or radiculopathy, lumbar region: Secondary | ICD-10-CM | POA: Diagnosis not present

## 2014-10-17 DIAGNOSIS — M5136 Other intervertebral disc degeneration, lumbar region: Secondary | ICD-10-CM | POA: Diagnosis not present

## 2014-10-17 DIAGNOSIS — M4806 Spinal stenosis, lumbar region: Secondary | ICD-10-CM | POA: Diagnosis not present

## 2014-10-22 DIAGNOSIS — H9319 Tinnitus, unspecified ear: Secondary | ICD-10-CM | POA: Diagnosis not present

## 2014-10-22 DIAGNOSIS — H905 Unspecified sensorineural hearing loss: Secondary | ICD-10-CM | POA: Diagnosis not present

## 2014-10-22 DIAGNOSIS — H9313 Tinnitus, bilateral: Secondary | ICD-10-CM | POA: Diagnosis not present

## 2014-11-15 ENCOUNTER — Other Ambulatory Visit: Payer: Medicare Other

## 2014-11-18 ENCOUNTER — Other Ambulatory Visit (INDEPENDENT_AMBULATORY_CARE_PROVIDER_SITE_OTHER): Payer: Medicare Other | Admitting: *Deleted

## 2014-11-18 DIAGNOSIS — E785 Hyperlipidemia, unspecified: Secondary | ICD-10-CM

## 2014-11-18 LAB — LIPID PANEL
CHOLESTEROL: 203 mg/dL — AB (ref 0–200)
HDL: 91 mg/dL (ref >39.00)
LDL Cholesterol: 87 mg/dL (ref 0–99)
NonHDL: 112
Total CHOL/HDL Ratio: 2
Triglycerides: 127 mg/dL (ref 0.0–149.0)
VLDL: 25.4 mg/dL (ref 0.0–40.0)

## 2014-11-18 LAB — HEPATIC FUNCTION PANEL
ALBUMIN: 3.9 g/dL (ref 3.5–5.2)
ALT: 19 U/L (ref 0–35)
AST: 25 U/L (ref 0–37)
Alkaline Phosphatase: 26 U/L — ABNORMAL LOW (ref 39–117)
BILIRUBIN TOTAL: 0.5 mg/dL (ref 0.2–1.2)
Bilirubin, Direct: 0 mg/dL (ref 0.0–0.3)
Total Protein: 6.6 g/dL (ref 6.0–8.3)

## 2014-12-11 ENCOUNTER — Other Ambulatory Visit: Payer: Self-pay | Admitting: Interventional Cardiology

## 2014-12-16 DIAGNOSIS — I25118 Atherosclerotic heart disease of native coronary artery with other forms of angina pectoris: Secondary | ICD-10-CM | POA: Diagnosis not present

## 2014-12-16 DIAGNOSIS — M5430 Sciatica, unspecified side: Secondary | ICD-10-CM | POA: Diagnosis not present

## 2014-12-16 DIAGNOSIS — K219 Gastro-esophageal reflux disease without esophagitis: Secondary | ICD-10-CM | POA: Diagnosis not present

## 2014-12-16 DIAGNOSIS — E780 Pure hypercholesterolemia: Secondary | ICD-10-CM | POA: Diagnosis not present

## 2014-12-16 DIAGNOSIS — Z0000 Encounter for general adult medical examination without abnormal findings: Secondary | ICD-10-CM | POA: Diagnosis not present

## 2014-12-16 DIAGNOSIS — D509 Iron deficiency anemia, unspecified: Secondary | ICD-10-CM | POA: Diagnosis not present

## 2014-12-16 DIAGNOSIS — M810 Age-related osteoporosis without current pathological fracture: Secondary | ICD-10-CM | POA: Diagnosis not present

## 2014-12-16 DIAGNOSIS — I129 Hypertensive chronic kidney disease with stage 1 through stage 4 chronic kidney disease, or unspecified chronic kidney disease: Secondary | ICD-10-CM | POA: Diagnosis not present

## 2014-12-16 DIAGNOSIS — E559 Vitamin D deficiency, unspecified: Secondary | ICD-10-CM | POA: Diagnosis not present

## 2014-12-16 DIAGNOSIS — I119 Hypertensive heart disease without heart failure: Secondary | ICD-10-CM | POA: Diagnosis not present

## 2014-12-16 DIAGNOSIS — Z23 Encounter for immunization: Secondary | ICD-10-CM | POA: Diagnosis not present

## 2014-12-16 DIAGNOSIS — N183 Chronic kidney disease, stage 3 (moderate): Secondary | ICD-10-CM | POA: Diagnosis not present

## 2014-12-27 DIAGNOSIS — M4806 Spinal stenosis, lumbar region: Secondary | ICD-10-CM | POA: Diagnosis not present

## 2014-12-27 DIAGNOSIS — M5136 Other intervertebral disc degeneration, lumbar region: Secondary | ICD-10-CM | POA: Diagnosis not present

## 2014-12-27 DIAGNOSIS — M4726 Other spondylosis with radiculopathy, lumbar region: Secondary | ICD-10-CM | POA: Diagnosis not present

## 2014-12-27 DIAGNOSIS — Z6827 Body mass index (BMI) 27.0-27.9, adult: Secondary | ICD-10-CM | POA: Diagnosis not present

## 2014-12-27 DIAGNOSIS — M4316 Spondylolisthesis, lumbar region: Secondary | ICD-10-CM | POA: Diagnosis not present

## 2015-01-09 DIAGNOSIS — M5136 Other intervertebral disc degeneration, lumbar region: Secondary | ICD-10-CM | POA: Diagnosis not present

## 2015-01-09 DIAGNOSIS — M4316 Spondylolisthesis, lumbar region: Secondary | ICD-10-CM | POA: Diagnosis not present

## 2015-01-09 DIAGNOSIS — M4806 Spinal stenosis, lumbar region: Secondary | ICD-10-CM | POA: Diagnosis not present

## 2015-01-09 DIAGNOSIS — M47816 Spondylosis without myelopathy or radiculopathy, lumbar region: Secondary | ICD-10-CM | POA: Diagnosis not present

## 2015-02-19 ENCOUNTER — Other Ambulatory Visit: Payer: Self-pay | Admitting: Interventional Cardiology

## 2015-02-21 ENCOUNTER — Other Ambulatory Visit: Payer: Self-pay | Admitting: Interventional Cardiology

## 2015-02-27 DIAGNOSIS — D485 Neoplasm of uncertain behavior of skin: Secondary | ICD-10-CM | POA: Diagnosis not present

## 2015-02-27 DIAGNOSIS — B078 Other viral warts: Secondary | ICD-10-CM | POA: Diagnosis not present

## 2015-02-27 DIAGNOSIS — Z85828 Personal history of other malignant neoplasm of skin: Secondary | ICD-10-CM | POA: Diagnosis not present

## 2015-02-27 DIAGNOSIS — L820 Inflamed seborrheic keratosis: Secondary | ICD-10-CM | POA: Diagnosis not present

## 2015-04-06 ENCOUNTER — Other Ambulatory Visit: Payer: Self-pay | Admitting: Interventional Cardiology

## 2015-04-07 DIAGNOSIS — M4806 Spinal stenosis, lumbar region: Secondary | ICD-10-CM | POA: Diagnosis not present

## 2015-04-07 DIAGNOSIS — M4310 Spondylolisthesis, site unspecified: Secondary | ICD-10-CM | POA: Diagnosis not present

## 2015-04-07 DIAGNOSIS — M48061 Spinal stenosis, lumbar region without neurogenic claudication: Secondary | ICD-10-CM | POA: Insufficient documentation

## 2015-04-10 ENCOUNTER — Other Ambulatory Visit: Payer: Self-pay | Admitting: Interventional Cardiology

## 2015-04-13 DIAGNOSIS — N3000 Acute cystitis without hematuria: Secondary | ICD-10-CM | POA: Diagnosis not present

## 2015-04-13 DIAGNOSIS — N390 Urinary tract infection, site not specified: Secondary | ICD-10-CM | POA: Diagnosis not present

## 2015-04-17 DIAGNOSIS — M4806 Spinal stenosis, lumbar region: Secondary | ICD-10-CM | POA: Diagnosis not present

## 2015-04-28 DIAGNOSIS — N390 Urinary tract infection, site not specified: Secondary | ICD-10-CM | POA: Diagnosis not present

## 2015-04-29 DIAGNOSIS — N390 Urinary tract infection, site not specified: Secondary | ICD-10-CM | POA: Diagnosis not present

## 2015-05-14 DIAGNOSIS — M4316 Spondylolisthesis, lumbar region: Secondary | ICD-10-CM | POA: Diagnosis not present

## 2015-05-14 DIAGNOSIS — M5137 Other intervertebral disc degeneration, lumbosacral region: Secondary | ICD-10-CM | POA: Diagnosis not present

## 2015-05-14 DIAGNOSIS — M4806 Spinal stenosis, lumbar region: Secondary | ICD-10-CM | POA: Diagnosis not present

## 2015-05-14 DIAGNOSIS — M4310 Spondylolisthesis, site unspecified: Secondary | ICD-10-CM | POA: Diagnosis not present

## 2015-05-16 ENCOUNTER — Encounter: Payer: Self-pay | Admitting: Interventional Cardiology

## 2015-05-16 ENCOUNTER — Ambulatory Visit (INDEPENDENT_AMBULATORY_CARE_PROVIDER_SITE_OTHER): Payer: Medicare Other | Admitting: Interventional Cardiology

## 2015-05-16 VITALS — BP 100/62 | HR 70 | Ht 62.0 in | Wt 156.8 lb

## 2015-05-16 DIAGNOSIS — I158 Other secondary hypertension: Secondary | ICD-10-CM | POA: Diagnosis not present

## 2015-05-16 DIAGNOSIS — R0602 Shortness of breath: Secondary | ICD-10-CM | POA: Diagnosis not present

## 2015-05-16 LAB — BASIC METABOLIC PANEL
BUN: 12 mg/dL (ref 6–23)
CALCIUM: 8.7 mg/dL (ref 8.4–10.5)
CHLORIDE: 99 meq/L (ref 96–112)
CO2: 29 mEq/L (ref 19–32)
Creatinine, Ser: 0.74 mg/dL (ref 0.40–1.20)
GFR: 79.53 mL/min (ref >60.00)
GLUCOSE: 99 mg/dL (ref 70–99)
POTASSIUM: 3.5 meq/L (ref 3.5–5.1)
Sodium: 132 mEq/L — ABNORMAL LOW (ref 135–145)

## 2015-05-16 LAB — BRAIN NATRIURETIC PEPTIDE: Pro B Natriuretic peptide (BNP): 150 pg/mL — ABNORMAL HIGH (ref 0.0–100.0)

## 2015-05-16 MED ORDER — FUROSEMIDE 40 MG PO TABS: 40.0000 mg | ORAL_TABLET | Freq: Every day | ORAL | Status: DC

## 2015-05-16 NOTE — Progress Notes (Signed)
Patient ID: Mihira Tozzi, female   DOB: October 26, 1929, 81 y.o.   MRN: [id number]     Cardiology Office Note   Date:  05/20/2015   ID:  [name], DOB Apr 02, 1929, MRN [id number]  PCP:  Osborne Casco, MD    CC: Leg swelling   Wt Readings from Last 3 Encounters:  05/16/15 156 lb 12.8 oz (71.124 kg)  05/15/14 146 lb 12.8 oz (66.588 kg)  03/06/12 144 lb (65.318 kg)       History of Present Illness: Jovanna Hodges is a 81 y.o. female  who has had CAD, prior BMS to RCA. She had a mild abnormality on her stress test in 2012. She has been managed medically.  Prior to her stent, she had a chest pain that would not resolve.  Over the past few years, she has had issues with falls. Over the past few months, she has noticed significant leg swelling. Her blood pressures have been checked regularly and they have been well controlled, typically in the 355-732 systolic range. She has not had any significant low blood pressures. She has an occasional systolic in the 202R to 427C range. The swelling is causing some discomfort. Her legs feel tight. She does not elevate her legs very much at home.      Past Medical History  Diagnosis Date  . CAD (coronary artery disease)   . Spinal stenosis   . Acid reflux   . Arthritis   . Osteoporosis   . Hyperlipidemia   . High cholesterol   . Hypertension   . Anemia     Past Surgical History  Procedure Laterality Date  . Carotid stent    . Abdominal hysterectomy       Current Outpatient Prescriptions  Medication Sig Dispense Refill  . acetaminophen (TYLENOL) 500 MG tablet Take 2,000 mg by mouth daily.     Marland Kitchen aspirin EC 81 MG tablet Take 81 mg by mouth every morning.    . denosumab (PROLIA) 60 MG/ML SOLN injection Inject 60 mg into the skin every 6 (six) months. Administer in upper arm, thigh, or abdomen    . docusate sodium (COLACE) 100 MG capsule Take 200 mg by mouth 2 (two) times daily.     Marland Kitchen esomeprazole (NEXIUM) 40 MG  capsule Take 40 mg by mouth daily. Takes 1 time in the morning, and take another at night if needed.    . furosemide (LASIX) 40 MG tablet Take 1 tablet (40 mg total) by mouth daily. 30 tablet 0  . gabapentin (NEURONTIN) 100 MG capsule Take 100 mg by mouth 3 (three) times daily.    . Glucosamine-Chondroitin (OSTEO BI-FLEX REGULAR STRENGTH PO) Take 1 capsule by mouth 2 (two) times daily.    . isosorbide mononitrate (IMDUR) 60 MG 24 hr tablet TAKE 1 TABLET DAILY 90 tablet 2  . metoprolol succinate (TOPROL-XL) 25 MG 24 hr tablet TAKE 1 TABLET DAILY 90 tablet 0  . Misc Natural Products (NARCOSOFT HERBAL LAX PO) Take by mouth.    . nitrofurantoin, macrocrystal-monohydrate, (MACROBID) 100 MG capsule Take 100 mg by mouth 2 (two) times daily.  0  . nitroGLYCERIN (NITROSTAT) 0.4 MG SL tablet Place 0.4 mg under the tongue every 5 (five) minutes as needed for chest pain.    Marland Kitchen omega-3 acid ethyl esters (LOVAZA) 1 G capsule TAKE 1 CAPSULE TWICE A DAY 180 capsule 0  . OVER THE COUNTER MEDICATION Take 2 tablets by mouth 2 (two) times daily. Herb-Lax    . polysaccharide  iron (NIFEREX) 150 MG CAPS capsule Take 150 mg by mouth every morning.    . Probiotic Product (ALIGN) 4 MG CAPS Take 1 tablet by mouth every morning.    Marland Kitchen RANEXA 500 MG 12 hr tablet TAKE 1 TABLET TWICE A DAY 180 tablet 0  . rosuvastatin (CRESTOR) 20 MG tablet Take 20 mg by mouth at bedtime.    . traMADol (ULTRAM) 50 MG tablet Take 50 mg by mouth every 6 (six) hours as needed (pain).    . Turmeric 500 MG CAPS Take by mouth 4 (four) times daily.     No current facility-administered medications for this visit.    Allergies:   Codeine; Hydrocodone; Morphine and related; Sulfa antibiotics; Evista; and Hydromorphone    Social History:  The patient  reports that she has never smoked. She has never used smokeless tobacco. She reports that she does not drink alcohol or use illicit drugs.   Family History:  The patient's *family history includes  Anemia in her mother; Heart attack in her brother.    ROS:  Please see the history of present illness.   Otherwise, review of systems are positive for leg swelling.   All other systems are reviewed and negative.    PHYSICAL EXAM: VS:  BP 100/62 mmHg  Pulse 70  Ht 5\' 2"  (1.575 m)  Wt 156 lb 12.8 oz (71.124 kg)  BMI 28.67 kg/m2 , BMI Body mass index is 28.67 kg/(m^2). GEN: Frail HEENT: normal Neck: no JVD, carotid bruits, or masses Cardiac: *RRR; no murmurs, rubs, or gallops,no edema  Respiratory:  clear to auscultation bilaterally, normal work of breathing GI: soft, nontender, nondistended, + BS MS: no deformity or atrophy Skin: Bruising Neuro:  Strength and sensation are intact Psych: euthymic mood, full affect   EKG:   The ekg ordered today demonstrates NSR, septal Q waves   Recent Labs: 11/18/2014: ALT 19 05/16/2015: BUN 12; Creatinine 0.74; Potassium 3.5; Pro B Natriuretic peptide (BNP) 150.0*; Sodium 132*   Lipid Panel    Component Value Date/Time   CHOL 203* 11/18/2014 1002   TRIG 127.0 11/18/2014 1002   HDL 91.00 11/18/2014 1002   CHOLHDL 2 11/18/2014 1002   VLDL 25.4 11/18/2014 1002   LDLCALC 87 11/18/2014 1002     Other studies Reviewed: Additional studies/ records that were reviewed today with results demonstrating: Lab work as noted above. LDL controlled.   ASSESSMENT AND PLAN:  1. Acute on chronic diastolic heart failure: Echocardiogram from 2013 reviewed. Normal left ventricular function at that time. Her lower extremity edema is probably a combination of diastolic heart failure with venous insufficiency. Start Lasix 40 mg daily. Check electrolytes today. We'll also check electrolytes in one week after she has been on the diarrhetic. 2. Shortness of breath: Check BNP today. No obvious signs of pulmonary edema on exam.  Her shortness of breath is more with exertion rather than at rest. She is not having any trouble lying flat. Hopefully, this will improve  with the diarrhetic. I think there is a good chance this is multifactorial and deconditioning is playing a big part. 3. Coronary artery disease: No angina. Continue current medications. 4. Hyperlipidemia: Continue Crestor. LDL well controlled. 5. Weight gain may be related to fluid retention as well as some dietary indiscretion.   Current medicines are reviewed at length with the patient today.  The patient concerns regarding her medicines were addressed.  The following changes have been made:  Lasix added  Labs/ tests ordered today  include:   Orders Placed This Encounter  Procedures  . Basic Metabolic Panel (BMET)  . Basic Metabolic Panel (BMET)  . B Nat Peptide  . EKG 12-Lead    Recommend 150 minutes/week of aerobic exercise Low fat, low carb, high fiber diet recommended  Disposition:   FU in 2 months   Teresita Madura., MD  05/20/2015 8:29 AM    [facility] [place], [place], [place]  [address] Phone: [phone]; Fax: [phone]

## 2015-05-16 NOTE — Patient Instructions (Signed)
Medication Instructions:  Start taking Furosemide 40 mg daily  Labwork: BMET and BNP today. BMET in 1 week.  Testing/Procedures: None  Follow-Up:   Any Other Special Instructions Will Be Listed Below (If Applicable). Elevate your legs.

## 2015-05-19 ENCOUNTER — Other Ambulatory Visit: Payer: Self-pay | Admitting: Interventional Cardiology

## 2015-05-19 ENCOUNTER — Telehealth: Payer: Self-pay | Admitting: Interventional Cardiology

## 2015-05-19 NOTE — Telephone Encounter (Signed)
Spoke with patient's daughter who states Dr. Irish Lack started patient on Lasix at office visit on Friday and over the weekend the patient experienced nausea, Rt shoulder pain, chills, SOB and wheezing.  States she was thirsty all weekend and initially did not have increased urination. Daughter states she has spoken to her mother this morning and she reports that she slept better last night and she feels better today and urination has increased.  States the nausea has improved but she continues to have SOB.  The daughter asked if patient could be seen today.  I advised that I need to get more information from the patient and will send a message to Dr. Irish Lack for advice.  I attempted to call patient at the number given by daughter [phone]) and did not get an answer or voice mail.  Will continue to call.

## 2015-05-19 NOTE — Telephone Encounter (Signed)
Call would not go through to number listed below.  I tried to reach pt at number listed but no answer or voicemail

## 2015-05-19 NOTE — Telephone Encounter (Signed)
New problem   Pt has been nausea, wheezing, pain in rt shoulder and not urinating like she should. Pt's daughter stated she it may be coming from lasix but really don't know. Please call daughter. Pt's isn't with her daughter.

## 2015-05-20 NOTE — Telephone Encounter (Signed)
F/u ° ° °Pt returning your call °

## 2015-05-20 NOTE — Telephone Encounter (Signed)
I think weakness symptom predated start of furosemide.  AS long as weight and swelling are decreasing, then would continue current meds.  Will see what BMet shows.  May be able to decrease Lasix to 20 mg after a while.  I do not need to see before back surgery.

## 2015-05-20 NOTE — Progress Notes (Signed)
Patient ID: Molly [name], female   DOB: Aug 25, 1929, 81 y.o.   MRN: [id number]    Patient scheduled to have back surgery in late June to help with her mobility.  Assuming that her current diastolic heart failure responds to diuretics, I estimate that she is at moderate risk of cardiac complication (8-2%) due to her age and comorbidities including history of CAD and chronic diastolic heart failure.  These risks cannot be modified.  Avoid excessive fluid administration to avoid CHF.  This was explained to the family and they understand.  All questions were answered.  Jettie Booze, MD

## 2015-05-20 NOTE — Telephone Encounter (Signed)
Per note 6.3.15

## 2015-05-20 NOTE — Telephone Encounter (Signed)
Spoke with daughter, Windell Hummingbird and informed her of information from Dr. Irish Lack. Informed daughter that pt needs to continue current meds and we will see what labs show this week. Daughter verbalized understanding and was in agreement with this plan.

## 2015-05-20 NOTE — Telephone Encounter (Signed)
Spoke with pt. She reports swelling is improved this morning but legs were swollen and painful yesterday. Swelling worsens as day goes on.  I instructed pt to keep legs elevated as much as possible. She reports the following weights- 6/4-154.8 6/5-154.8 6/6-151.6 6/7-151.6 She reports she still feels weak. Short of breath with exertion. No improvement in shortness of breath since starting lasix.   She would like me to call her daughter who follows her medical issues---Molly [name] [phone].  I called and spoke with Molly [name]. She usually comes to office visits with pt but was out of town last week. Molly [name] reports pt is very fragile and she is concerned weakness and nausea are due to furosemide.  At this point pt is continuing Lasix and having BMP drawn on Friday.  They are asking if Dr. Irish Lack has any additional recommendations.  Also would like to know if pt needs office visit prior to surgery 6/30.  Dr. Hassell Done recommendations should be called to [facility].

## 2015-05-20 NOTE — Telephone Encounter (Signed)
Spoke with pt's daughter and told her I had been unable to reach pt.  Per daughter cell number is correct in chart but home number is [phone].  Daughter reports she has spoken with pt this AM and pt is feeling better. Chills, coldness better but did throw up once during night. Swelling improved initially and now returned. Pt weighs daily but daughter does not know readings.  Daughter also reports Dr. Irish Lack wanted to see pt back before planned spinal surgery on 06/12/15.  Will contact pt to discuss swelling and weights.  I placed call to pt and left message to call back

## 2015-05-23 ENCOUNTER — Other Ambulatory Visit: Payer: Self-pay | Admitting: *Deleted

## 2015-05-23 ENCOUNTER — Other Ambulatory Visit (INDEPENDENT_AMBULATORY_CARE_PROVIDER_SITE_OTHER): Payer: Medicare Other | Admitting: *Deleted

## 2015-05-23 ENCOUNTER — Telehealth: Payer: Self-pay | Admitting: *Deleted

## 2015-05-23 DIAGNOSIS — E878 Other disorders of electrolyte and fluid balance, not elsewhere classified: Secondary | ICD-10-CM

## 2015-05-23 DIAGNOSIS — I158 Other secondary hypertension: Secondary | ICD-10-CM | POA: Diagnosis not present

## 2015-05-23 LAB — BASIC METABOLIC PANEL
BUN: 14 mg/dL (ref 6–23)
CALCIUM: 10.5 mg/dL (ref 8.4–10.5)
CO2: 32 meq/L (ref 19–32)
CREATININE: 0.94 mg/dL (ref 0.40–1.20)
Chloride: 97 mEq/L (ref 96–112)
GFR: 60.34 mL/min (ref >60.00)
Glucose, Bld: 96 mg/dL (ref 70–99)
Potassium: 2.8 mEq/L — CL (ref 3.5–5.1)
Sodium: 139 mEq/L (ref 135–145)

## 2015-05-23 MED ORDER — POTASSIUM CHLORIDE CRYS ER 20 MEQ PO TBCR: 20.0000 meq | EXTENDED_RELEASE_TABLET | Freq: Every day | ORAL | Status: DC

## 2015-05-23 NOTE — Telephone Encounter (Signed)
Pt.s daughter Called and informed to give  Potassium 2 - 20 meq tabs now and then 6-8 hrs later take 2 more tabs then tomorrow take 20 meq daily . Pt.s daughter stated understanding of instructions

## 2015-05-23 NOTE — Telephone Encounter (Signed)
All pervious orders per Dr. Linard Millers

## 2015-05-24 ENCOUNTER — Encounter: Payer: Self-pay | Admitting: Interventional Cardiology

## 2015-05-26 ENCOUNTER — Other Ambulatory Visit: Payer: Self-pay | Admitting: *Deleted

## 2015-05-26 ENCOUNTER — Telehealth: Payer: Self-pay | Admitting: Interventional Cardiology

## 2015-05-26 NOTE — Telephone Encounter (Addendum)
Spoke with daughter, Pleas Koch, in regards to pt coming in on Wednesday for repeat labs. While on the phone daughter gave me a list of weights that she wanted to make Dr. Irish Lack aware of since her mother was started on Lasix. Daughter also wanted to provide this information so that Dr. Irish Lack would have it to help with his decision on what to do about Lasix in regards to Pt Advice Request that was sent over this morning. Weights are as follows: 6/3, 6/4, 6/5- 154.8 6/6, 6/7- 151.6 6/8- 152.6 6/9- 151.8 6/10- 151 6/11- 152.2 6/12- 152 6/13- 151.4 Daughter states that swelling is much better and pt's swelling is back down to her normal. Will route to Dr. Irish Lack for review

## 2015-05-27 NOTE — Telephone Encounter (Signed)
OK to titrate dose between 20-40 mg based on her level of swelling and how she is feeling.

## 2015-05-27 NOTE — Telephone Encounter (Signed)
Continue furosemide long-term to help with swelling.

## 2015-05-27 NOTE — Telephone Encounter (Signed)
Spoke with daughter, Windell Hummingbird, and informed her that Dr. Irish Lack said to continue Furosemide long term to help with swelling. Daughter verbalized understanding. Daughter wanted to make Dr. Irish Lack aware that her mother is still having the nausea and weakness. Daughter would like for Dr. Irish Lack to possibly consider decreasing Furosemide if labs are better tomorrow. Informed daughter that I would make Dr. Irish Lack aware and once labs are reviewed, he can make a decision on that. Daughter verbalized understanding and was appreciative for help.

## 2015-05-28 ENCOUNTER — Other Ambulatory Visit (INDEPENDENT_AMBULATORY_CARE_PROVIDER_SITE_OTHER): Payer: Medicare Other | Admitting: *Deleted

## 2015-05-28 DIAGNOSIS — E878 Other disorders of electrolyte and fluid balance, not elsewhere classified: Secondary | ICD-10-CM | POA: Diagnosis not present

## 2015-05-28 LAB — BASIC METABOLIC PANEL
BUN: 16 mg/dL (ref 6–23)
CALCIUM: 10.1 mg/dL (ref 8.4–10.5)
CHLORIDE: 99 meq/L (ref 96–112)
CO2: 31 meq/L (ref 19–32)
Creat: 1.08 mg/dL (ref 0.50–1.10)
Glucose, Bld: 86 mg/dL (ref 70–99)
Potassium: 3.7 mEq/L (ref 3.5–5.3)
SODIUM: 140 meq/L (ref 135–145)

## 2015-05-28 NOTE — Addendum Note (Signed)
Addended by: Eulis Foster on: 05/28/2015 09:22 AM   Modules accepted: Orders

## 2015-05-29 NOTE — Telephone Encounter (Signed)
Spoke with daughter, Windell Hummingbird, and informed her that Dr. Irish Lack said it was ok to titrate the Lasix 20-40mg  based on her level of swelling and how she is feeling. Daughter verbalized understanding and was in agreement with this plan.

## 2015-06-04 DIAGNOSIS — M810 Age-related osteoporosis without current pathological fracture: Secondary | ICD-10-CM | POA: Insufficient documentation

## 2015-06-04 DIAGNOSIS — M542 Cervicalgia: Secondary | ICD-10-CM | POA: Diagnosis not present

## 2015-06-04 DIAGNOSIS — K5900 Constipation, unspecified: Secondary | ICD-10-CM | POA: Insufficient documentation

## 2015-06-04 DIAGNOSIS — I5030 Unspecified diastolic (congestive) heart failure: Secondary | ICD-10-CM | POA: Insufficient documentation

## 2015-06-04 DIAGNOSIS — I10 Essential (primary) hypertension: Secondary | ICD-10-CM | POA: Diagnosis not present

## 2015-06-04 DIAGNOSIS — Z9889 Other specified postprocedural states: Secondary | ICD-10-CM

## 2015-06-04 DIAGNOSIS — M4310 Spondylolisthesis, site unspecified: Secondary | ICD-10-CM | POA: Diagnosis not present

## 2015-06-04 DIAGNOSIS — Z01812 Encounter for preprocedural laboratory examination: Secondary | ICD-10-CM | POA: Diagnosis not present

## 2015-06-04 DIAGNOSIS — M2550 Pain in unspecified joint: Secondary | ICD-10-CM | POA: Diagnosis not present

## 2015-06-04 DIAGNOSIS — R112 Nausea with vomiting, unspecified: Secondary | ICD-10-CM | POA: Insufficient documentation

## 2015-06-04 DIAGNOSIS — Z8719 Personal history of other diseases of the digestive system: Secondary | ICD-10-CM | POA: Insufficient documentation

## 2015-06-04 DIAGNOSIS — I2510 Atherosclerotic heart disease of native coronary artery without angina pectoris: Secondary | ICD-10-CM | POA: Insufficient documentation

## 2015-06-04 DIAGNOSIS — E785 Hyperlipidemia, unspecified: Secondary | ICD-10-CM | POA: Insufficient documentation

## 2015-06-04 DIAGNOSIS — M549 Dorsalgia, unspecified: Secondary | ICD-10-CM | POA: Diagnosis not present

## 2015-06-05 DIAGNOSIS — N390 Urinary tract infection, site not specified: Secondary | ICD-10-CM | POA: Diagnosis not present

## 2015-06-05 DIAGNOSIS — N3000 Acute cystitis without hematuria: Secondary | ICD-10-CM | POA: Diagnosis not present

## 2015-06-12 ENCOUNTER — Other Ambulatory Visit: Payer: Self-pay | Admitting: *Deleted

## 2015-06-12 DIAGNOSIS — M79606 Pain in leg, unspecified: Secondary | ICD-10-CM | POA: Diagnosis not present

## 2015-06-12 DIAGNOSIS — N182 Chronic kidney disease, stage 2 (mild): Secondary | ICD-10-CM | POA: Diagnosis present

## 2015-06-12 DIAGNOSIS — R54 Age-related physical debility: Secondary | ICD-10-CM | POA: Diagnosis present

## 2015-06-12 DIAGNOSIS — G8929 Other chronic pain: Secondary | ICD-10-CM | POA: Diagnosis not present

## 2015-06-12 DIAGNOSIS — T481X5A Adverse effect of skeletal muscle relaxants [neuromuscular blocking agents], initial encounter: Secondary | ICD-10-CM | POA: Diagnosis not present

## 2015-06-12 DIAGNOSIS — M4310 Spondylolisthesis, site unspecified: Secondary | ICD-10-CM | POA: Diagnosis not present

## 2015-06-12 DIAGNOSIS — I129 Hypertensive chronic kidney disease with stage 1 through stage 4 chronic kidney disease, or unspecified chronic kidney disease: Secondary | ICD-10-CM | POA: Diagnosis present

## 2015-06-12 DIAGNOSIS — Z7409 Other reduced mobility: Secondary | ICD-10-CM | POA: Diagnosis not present

## 2015-06-12 DIAGNOSIS — K219 Gastro-esophageal reflux disease without esophagitis: Secondary | ICD-10-CM | POA: Diagnosis present

## 2015-06-12 DIAGNOSIS — M545 Low back pain: Secondary | ICD-10-CM | POA: Diagnosis not present

## 2015-06-12 DIAGNOSIS — M810 Age-related osteoporosis without current pathological fracture: Secondary | ICD-10-CM | POA: Diagnosis present

## 2015-06-12 DIAGNOSIS — Z90710 Acquired absence of both cervix and uterus: Secondary | ICD-10-CM | POA: Diagnosis not present

## 2015-06-12 DIAGNOSIS — K5900 Constipation, unspecified: Secondary | ICD-10-CM | POA: Diagnosis present

## 2015-06-12 DIAGNOSIS — D62 Acute posthemorrhagic anemia: Secondary | ICD-10-CM | POA: Diagnosis not present

## 2015-06-12 DIAGNOSIS — Z823 Family history of stroke: Secondary | ICD-10-CM | POA: Diagnosis not present

## 2015-06-12 DIAGNOSIS — M79604 Pain in right leg: Secondary | ICD-10-CM | POA: Diagnosis not present

## 2015-06-12 DIAGNOSIS — S41112A Laceration without foreign body of left upper arm, initial encounter: Secondary | ICD-10-CM | POA: Diagnosis present

## 2015-06-12 DIAGNOSIS — S41111A Laceration without foreign body of right upper arm, initial encounter: Secondary | ICD-10-CM | POA: Diagnosis present

## 2015-06-12 DIAGNOSIS — Z833 Family history of diabetes mellitus: Secondary | ICD-10-CM | POA: Diagnosis not present

## 2015-06-12 DIAGNOSIS — K289 Gastrojejunal ulcer, unspecified as acute or chronic, without hemorrhage or perforation: Secondary | ICD-10-CM | POA: Diagnosis present

## 2015-06-12 DIAGNOSIS — M4806 Spinal stenosis, lumbar region: Secondary | ICD-10-CM | POA: Diagnosis present

## 2015-06-12 DIAGNOSIS — M4316 Spondylolisthesis, lumbar region: Secondary | ICD-10-CM | POA: Diagnosis not present

## 2015-06-12 DIAGNOSIS — M79605 Pain in left leg: Secondary | ICD-10-CM | POA: Diagnosis not present

## 2015-06-12 DIAGNOSIS — I5032 Chronic diastolic (congestive) heart failure: Secondary | ICD-10-CM | POA: Diagnosis present

## 2015-06-12 DIAGNOSIS — I2510 Atherosclerotic heart disease of native coronary artery without angina pectoris: Secondary | ICD-10-CM | POA: Diagnosis present

## 2015-06-12 DIAGNOSIS — E785 Hyperlipidemia, unspecified: Secondary | ICD-10-CM | POA: Diagnosis present

## 2015-06-12 DIAGNOSIS — Z8249 Family history of ischemic heart disease and other diseases of the circulatory system: Secondary | ICD-10-CM | POA: Diagnosis not present

## 2015-06-12 DIAGNOSIS — G8918 Other acute postprocedural pain: Secondary | ICD-10-CM | POA: Diagnosis not present

## 2015-06-12 DIAGNOSIS — G8911 Acute pain due to trauma: Secondary | ICD-10-CM | POA: Diagnosis not present

## 2015-06-12 DIAGNOSIS — R400 Somnolence: Secondary | ICD-10-CM | POA: Diagnosis not present

## 2015-06-12 MED ORDER — FUROSEMIDE 40 MG PO TABS: 40.0000 mg | ORAL_TABLET | Freq: Every day | ORAL | Status: DC

## 2015-06-17 DIAGNOSIS — Z981 Arthrodesis status: Secondary | ICD-10-CM | POA: Diagnosis not present

## 2015-06-17 DIAGNOSIS — K592 Neurogenic bowel, not elsewhere classified: Secondary | ICD-10-CM | POA: Diagnosis present

## 2015-06-17 DIAGNOSIS — N319 Neuromuscular dysfunction of bladder, unspecified: Secondary | ICD-10-CM | POA: Diagnosis not present

## 2015-06-17 DIAGNOSIS — I82401 Acute embolism and thrombosis of unspecified deep veins of right lower extremity: Secondary | ICD-10-CM | POA: Diagnosis not present

## 2015-06-17 DIAGNOSIS — Z7409 Other reduced mobility: Secondary | ICD-10-CM | POA: Diagnosis not present

## 2015-06-17 DIAGNOSIS — S3401XA Concussion and edema of lumbar spinal cord, initial encounter: Secondary | ICD-10-CM | POA: Diagnosis not present

## 2015-06-17 DIAGNOSIS — E876 Hypokalemia: Secondary | ICD-10-CM | POA: Diagnosis not present

## 2015-06-17 DIAGNOSIS — M4316 Spondylolisthesis, lumbar region: Secondary | ICD-10-CM | POA: Diagnosis not present

## 2015-06-17 DIAGNOSIS — S41111A Laceration without foreign body of right upper arm, initial encounter: Secondary | ICD-10-CM | POA: Diagnosis not present

## 2015-06-17 DIAGNOSIS — I10 Essential (primary) hypertension: Secondary | ICD-10-CM | POA: Diagnosis not present

## 2015-06-17 DIAGNOSIS — R531 Weakness: Secondary | ICD-10-CM | POA: Diagnosis not present

## 2015-06-17 DIAGNOSIS — I2510 Atherosclerotic heart disease of native coronary artery without angina pectoris: Secondary | ICD-10-CM | POA: Diagnosis not present

## 2015-06-17 DIAGNOSIS — M47816 Spondylosis without myelopathy or radiculopathy, lumbar region: Secondary | ICD-10-CM | POA: Diagnosis not present

## 2015-06-17 DIAGNOSIS — S41119A Laceration without foreign body of unspecified upper arm, initial encounter: Secondary | ICD-10-CM | POA: Diagnosis not present

## 2015-06-17 DIAGNOSIS — K5900 Constipation, unspecified: Secondary | ICD-10-CM | POA: Diagnosis not present

## 2015-06-17 DIAGNOSIS — I621 Nontraumatic extradural hemorrhage: Secondary | ICD-10-CM | POA: Diagnosis not present

## 2015-06-17 DIAGNOSIS — I5032 Chronic diastolic (congestive) heart failure: Secondary | ICD-10-CM | POA: Diagnosis not present

## 2015-06-17 DIAGNOSIS — M810 Age-related osteoporosis without current pathological fracture: Secondary | ICD-10-CM | POA: Diagnosis present

## 2015-06-17 DIAGNOSIS — Z5189 Encounter for other specified aftercare: Secondary | ICD-10-CM | POA: Diagnosis not present

## 2015-06-17 DIAGNOSIS — I82409 Acute embolism and thrombosis of unspecified deep veins of unspecified lower extremity: Secondary | ICD-10-CM | POA: Diagnosis not present

## 2015-06-17 DIAGNOSIS — K219 Gastro-esophageal reflux disease without esophagitis: Secondary | ICD-10-CM | POA: Diagnosis not present

## 2015-06-17 DIAGNOSIS — E785 Hyperlipidemia, unspecified: Secondary | ICD-10-CM | POA: Diagnosis not present

## 2015-06-17 DIAGNOSIS — T45515A Adverse effect of anticoagulants, initial encounter: Secondary | ICD-10-CM | POA: Diagnosis not present

## 2015-06-17 DIAGNOSIS — N390 Urinary tract infection, site not specified: Secondary | ICD-10-CM | POA: Diagnosis not present

## 2015-06-17 DIAGNOSIS — R1310 Dysphagia, unspecified: Secondary | ICD-10-CM | POA: Diagnosis not present

## 2015-06-17 DIAGNOSIS — I5030 Unspecified diastolic (congestive) heart failure: Secondary | ICD-10-CM | POA: Diagnosis not present

## 2015-06-17 DIAGNOSIS — S41112A Laceration without foreign body of left upper arm, initial encounter: Secondary | ICD-10-CM | POA: Diagnosis not present

## 2015-06-17 DIAGNOSIS — I824Z1 Acute embolism and thrombosis of unspecified deep veins of right distal lower extremity: Secondary | ICD-10-CM | POA: Diagnosis not present

## 2015-06-17 DIAGNOSIS — D62 Acute posthemorrhagic anemia: Secondary | ICD-10-CM | POA: Diagnosis not present

## 2015-06-17 DIAGNOSIS — M4806 Spinal stenosis, lumbar region: Secondary | ICD-10-CM | POA: Diagnosis present

## 2015-06-17 DIAGNOSIS — M4310 Spondylolisthesis, site unspecified: Secondary | ICD-10-CM | POA: Insufficient documentation

## 2015-06-17 DIAGNOSIS — Z90710 Acquired absence of both cervix and uterus: Secondary | ICD-10-CM | POA: Diagnosis not present

## 2015-06-17 DIAGNOSIS — G834 Cauda equina syndrome: Secondary | ICD-10-CM | POA: Diagnosis not present

## 2015-06-17 DIAGNOSIS — I82441 Acute embolism and thrombosis of right tibial vein: Secondary | ICD-10-CM | POA: Diagnosis not present

## 2015-06-17 DIAGNOSIS — G9519 Other vascular myelopathies: Secondary | ICD-10-CM | POA: Diagnosis not present

## 2015-06-19 DIAGNOSIS — Z7409 Other reduced mobility: Secondary | ICD-10-CM | POA: Insufficient documentation

## 2015-06-21 DIAGNOSIS — I2510 Atherosclerotic heart disease of native coronary artery without angina pectoris: Secondary | ICD-10-CM | POA: Diagnosis present

## 2015-06-21 DIAGNOSIS — Z7409 Other reduced mobility: Secondary | ICD-10-CM | POA: Diagnosis not present

## 2015-06-21 DIAGNOSIS — K5900 Constipation, unspecified: Secondary | ICD-10-CM | POA: Diagnosis present

## 2015-06-21 DIAGNOSIS — Z886 Allergy status to analgesic agent status: Secondary | ICD-10-CM | POA: Diagnosis not present

## 2015-06-21 DIAGNOSIS — G8929 Other chronic pain: Secondary | ICD-10-CM | POA: Diagnosis present

## 2015-06-21 DIAGNOSIS — I5032 Chronic diastolic (congestive) heart failure: Secondary | ICD-10-CM | POA: Diagnosis present

## 2015-06-21 DIAGNOSIS — G9519 Other vascular myelopathies: Secondary | ICD-10-CM | POA: Diagnosis present

## 2015-06-21 DIAGNOSIS — K219 Gastro-esophageal reflux disease without esophagitis: Secondary | ICD-10-CM | POA: Diagnosis present

## 2015-06-21 DIAGNOSIS — M810 Age-related osteoporosis without current pathological fracture: Secondary | ICD-10-CM | POA: Diagnosis present

## 2015-06-21 DIAGNOSIS — G834 Cauda equina syndrome: Secondary | ICD-10-CM | POA: Diagnosis present

## 2015-06-21 DIAGNOSIS — Z883 Allergy status to other anti-infective agents status: Secondary | ICD-10-CM | POA: Diagnosis not present

## 2015-06-21 DIAGNOSIS — Z955 Presence of coronary angioplasty implant and graft: Secondary | ICD-10-CM | POA: Diagnosis not present

## 2015-06-21 DIAGNOSIS — I129 Hypertensive chronic kidney disease with stage 1 through stage 4 chronic kidney disease, or unspecified chronic kidney disease: Secondary | ICD-10-CM | POA: Diagnosis present

## 2015-06-21 DIAGNOSIS — Z7982 Long term (current) use of aspirin: Secondary | ICD-10-CM | POA: Diagnosis not present

## 2015-06-21 DIAGNOSIS — I824Z1 Acute embolism and thrombosis of unspecified deep veins of right distal lower extremity: Secondary | ICD-10-CM | POA: Diagnosis present

## 2015-06-21 DIAGNOSIS — N390 Urinary tract infection, site not specified: Secondary | ICD-10-CM | POA: Diagnosis present

## 2015-06-21 DIAGNOSIS — R531 Weakness: Secondary | ICD-10-CM | POA: Diagnosis not present

## 2015-06-21 DIAGNOSIS — Z888 Allergy status to other drugs, medicaments and biological substances status: Secondary | ICD-10-CM | POA: Diagnosis not present

## 2015-06-21 DIAGNOSIS — E785 Hyperlipidemia, unspecified: Secondary | ICD-10-CM | POA: Diagnosis present

## 2015-06-21 DIAGNOSIS — Z885 Allergy status to narcotic agent status: Secondary | ICD-10-CM | POA: Diagnosis not present

## 2015-06-21 DIAGNOSIS — N189 Chronic kidney disease, unspecified: Secondary | ICD-10-CM | POA: Diagnosis present

## 2015-06-21 DIAGNOSIS — Z981 Arthrodesis status: Secondary | ICD-10-CM | POA: Diagnosis not present

## 2015-06-25 DIAGNOSIS — Z7409 Other reduced mobility: Secondary | ICD-10-CM | POA: Diagnosis present

## 2015-06-25 DIAGNOSIS — R4189 Other symptoms and signs involving cognitive functions and awareness: Secondary | ICD-10-CM | POA: Diagnosis present

## 2015-06-25 DIAGNOSIS — M810 Age-related osteoporosis without current pathological fracture: Secondary | ICD-10-CM | POA: Diagnosis present

## 2015-06-25 DIAGNOSIS — I5032 Chronic diastolic (congestive) heart failure: Secondary | ICD-10-CM | POA: Diagnosis present

## 2015-06-25 DIAGNOSIS — Z981 Arthrodesis status: Secondary | ICD-10-CM | POA: Diagnosis not present

## 2015-06-25 DIAGNOSIS — M4806 Spinal stenosis, lumbar region: Secondary | ICD-10-CM | POA: Diagnosis present

## 2015-06-25 DIAGNOSIS — R6889 Other general symptoms and signs: Secondary | ICD-10-CM | POA: Diagnosis present

## 2015-06-25 DIAGNOSIS — D649 Anemia, unspecified: Secondary | ICD-10-CM | POA: Diagnosis present

## 2015-06-25 DIAGNOSIS — D62 Acute posthemorrhagic anemia: Secondary | ICD-10-CM | POA: Diagnosis not present

## 2015-06-25 DIAGNOSIS — I82449 Acute embolism and thrombosis of unspecified tibial vein: Secondary | ICD-10-CM | POA: Diagnosis present

## 2015-06-25 DIAGNOSIS — S41111A Laceration without foreign body of right upper arm, initial encounter: Secondary | ICD-10-CM | POA: Diagnosis not present

## 2015-06-25 DIAGNOSIS — Z9889 Other specified postprocedural states: Secondary | ICD-10-CM | POA: Diagnosis not present

## 2015-06-25 DIAGNOSIS — N390 Urinary tract infection, site not specified: Secondary | ICD-10-CM | POA: Diagnosis not present

## 2015-06-25 DIAGNOSIS — N319 Neuromuscular dysfunction of bladder, unspecified: Secondary | ICD-10-CM | POA: Diagnosis not present

## 2015-06-25 DIAGNOSIS — R32 Unspecified urinary incontinence: Secondary | ICD-10-CM | POA: Diagnosis present

## 2015-06-25 DIAGNOSIS — K5900 Constipation, unspecified: Secondary | ICD-10-CM | POA: Diagnosis present

## 2015-06-25 DIAGNOSIS — K219 Gastro-esophageal reflux disease without esophagitis: Secondary | ICD-10-CM | POA: Diagnosis present

## 2015-06-25 DIAGNOSIS — S41112A Laceration without foreign body of left upper arm, initial encounter: Secondary | ICD-10-CM | POA: Diagnosis not present

## 2015-06-25 DIAGNOSIS — M79673 Pain in unspecified foot: Secondary | ICD-10-CM | POA: Diagnosis not present

## 2015-06-25 DIAGNOSIS — M79672 Pain in left foot: Secondary | ICD-10-CM | POA: Diagnosis not present

## 2015-06-25 DIAGNOSIS — Z5189 Encounter for other specified aftercare: Secondary | ICD-10-CM | POA: Diagnosis not present

## 2015-06-25 DIAGNOSIS — E785 Hyperlipidemia, unspecified: Secondary | ICD-10-CM | POA: Diagnosis not present

## 2015-06-25 DIAGNOSIS — M79671 Pain in right foot: Secondary | ICD-10-CM | POA: Diagnosis not present

## 2015-06-25 DIAGNOSIS — I5030 Unspecified diastolic (congestive) heart failure: Secondary | ICD-10-CM | POA: Diagnosis not present

## 2015-06-25 DIAGNOSIS — R339 Retention of urine, unspecified: Secondary | ICD-10-CM | POA: Diagnosis present

## 2015-06-25 DIAGNOSIS — G959 Disease of spinal cord, unspecified: Secondary | ICD-10-CM | POA: Diagnosis present

## 2015-06-25 DIAGNOSIS — G8929 Other chronic pain: Secondary | ICD-10-CM | POA: Diagnosis present

## 2015-06-25 DIAGNOSIS — M21371 Foot drop, right foot: Secondary | ICD-10-CM | POA: Diagnosis present

## 2015-06-25 DIAGNOSIS — I10 Essential (primary) hypertension: Secondary | ICD-10-CM | POA: Diagnosis not present

## 2015-06-25 DIAGNOSIS — I2510 Atherosclerotic heart disease of native coronary artery without angina pectoris: Secondary | ICD-10-CM | POA: Diagnosis not present

## 2015-06-25 DIAGNOSIS — K592 Neurogenic bowel, not elsewhere classified: Secondary | ICD-10-CM | POA: Diagnosis not present

## 2015-06-30 DIAGNOSIS — I82409 Acute embolism and thrombosis of unspecified deep veins of unspecified lower extremity: Secondary | ICD-10-CM | POA: Insufficient documentation

## 2015-07-04 ENCOUNTER — Other Ambulatory Visit: Payer: Self-pay | Admitting: Interventional Cardiology

## 2015-07-07 ENCOUNTER — Other Ambulatory Visit: Payer: Self-pay | Admitting: Interventional Cardiology

## 2015-07-07 ENCOUNTER — Other Ambulatory Visit: Payer: Self-pay

## 2015-07-07 MED ORDER — RANOLAZINE ER 500 MG PO TB12: 500.0000 mg | ORAL_TABLET | Freq: Two times a day (BID) | ORAL | Status: DC

## 2015-07-11 ENCOUNTER — Ambulatory Visit: Payer: Medicare Other | Attending: Surgical | Admitting: Physical Therapy

## 2015-07-11 DIAGNOSIS — Z9181 History of falling: Secondary | ICD-10-CM | POA: Diagnosis not present

## 2015-07-11 DIAGNOSIS — R293 Abnormal posture: Secondary | ICD-10-CM | POA: Diagnosis not present

## 2015-07-11 DIAGNOSIS — R29898 Other symptoms and signs involving the musculoskeletal system: Secondary | ICD-10-CM | POA: Diagnosis not present

## 2015-07-11 DIAGNOSIS — R2681 Unsteadiness on feet: Secondary | ICD-10-CM

## 2015-07-11 NOTE — Patient Instructions (Signed)
   James Senn PT, DPT, LAT, ATC  [facility] Phone: [phone]     

## 2015-07-11 NOTE — Therapy (Signed)
[place], [place], [address] Phone: [phone]   Fax:  [phone]  Physical Therapy Evaluation  Patient Details  Name: Molly [name] MRN: [id number] Date of Birth: 26-Oct-1929 Referring Provider:  Kerney Elbe, [place]*  Encounter Date: 07/11/2015      PT End of Session - 07/11/15 1301    Visit Number 1   Number of Visits 16   Date for PT Re-Evaluation 09/05/15   Authorization Type Medicare   PT Start Time 1100   PT Stop Time 1155   PT Time Calculation (min) 55 min   Activity Tolerance Patient tolerated treatment well   Behavior During Therapy [facility] for tasks assessed/performed      Past Medical History  Diagnosis Date  . CAD (coronary artery disease)   . Spinal stenosis   . Acid reflux   . Arthritis   . Osteoporosis   . Hyperlipidemia   . High cholesterol   . Hypertension   . Anemia     Past Surgical History  Procedure Laterality Date  . Carotid stent    . Abdominal hysterectomy      There were no vitals filed for this visit.  Visit Diagnosis:  Weakness of both legs  Unsteadiness  Risk for falls  Abnormal posture      Subjective Assessment - 07/11/15 1108    Subjective pt is a 81 y.o F with CC of low back pain and weakness in the legs following surgery two surgeries with the first on 06/12/2015 and another 06/21/2015. pt reports doing inpatient rehab for 10 days before coming to outpatient. She reports her caregivers help ing with therapy at home but its not the same per pt report.    Patient is accompained by: Family member  daughter   Limitations Sitting;Lifting;Standing;Walking;House hold activities   How long can you sit comfortably? 30 min   How long can you stand comfortably? 10-15 min with rollator   How long can you walk comfortably? 10- 15 min with rollator   Diagnostic tests MRI  per pt report before surgery found spinal stenosis, and compression   Patient  Stated Goals to be able to walk, to go shopping and get out of the house.    Currently in Pain? Yes   Pain Score 0-No pain   Pain Location Back   Pain Orientation Mid;Lower   Pain Type Surgical pain   Pain Onset More than a month ago   Aggravating Factors  getting in and out of bed   Pain Relieving Factors N/A            [facility] PT Assessment - 07/11/15 1118    Assessment   Medical Diagnosis low back surgery  at L5/L4 lumbar fusion and laminectomy   Onset Date/Surgical Date 06/12/15  and 06/21/2015   Hand Dominance Right   Next MD Visit 07/14/2015   Prior Therapy yes   Precautions   Precautions Back   Precaution Comments no twisting, turning, bending, lifting   Restrictions   Weight Bearing Restrictions No   Balance Screen   Has the patient fallen in the past 6 months No   Has the patient had a decrease in activity level because of a fear of falling?  No   Is the patient reluctant to leave their home because of a fear of falling?  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, 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, 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, 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,    Cognition   Overall Cognitive Status Within Functional Limits for tasks assessed   Observation/Other Assessments   Observations incision site appears clean, and healing well   Focus on Therapeutic Outcomes (FOTO)  100% limited  71%  limited   Posture/Postural Control   Posture/Postural Control Postural limitations   Postural Limitations Increased thoracic kyphosis;Rounded Shoulders;Forward head;Flexed trunk   ROM / Strength   AROM / PROM / Strength AROM;Strength   AROM   AROM Assessment Site Lumbar   Lumbar Flexion 10 degrees   Lumbar Extension 10 degrees   Lumbar - Right Side Bend 20   Lumbar - Left Side Bend 20   Strength   Strength Assessment Site Hip;Knee;Ankle   Right/Left Hip Right;Left   Right Hip Flexion 3+/5   Right Hip Extension 3/5   Right Hip ABduction 3+/5   Right Hip ADduction 4-/5   Left Hip Flexion 3+/5   Left Hip Extension 3/5   Left Hip ABduction 3+/5   Left Hip ADduction 4-/5   Right/Left Knee Right;Left   Right Knee Flexion 3+/5   Right Knee Extension 3+/5   Left Knee Flexion 4-/5   Left Knee Extension 4-/5   Right/Left Ankle Right;Left   Right Ankle Dorsiflexion 3-/5   Right Ankle Plantar Flexion 3+/5   Right Ankle Inversion 3-/5   Right Ankle Eversion 3-/5   Left Ankle Dorsiflexion 4-/5   Left Ankle Plantar Flexion 4-/5   Left Ankle Inversion 3/5   Left Ankle Eversion 3/5   Palpation   Palpation comment tenderness located at the incision site   Ambulation/Gait   Gait Pattern Step-to pattern;Decreased step length - right;Decreased step length - left;Antalgic;Trunk flexed;Narrow base of support;Poor foot clearance - right   Standardized Balance Assessment   Standardized Balance Assessment 10 meter walk test   10 Meter Walk .13 m/s  72 sec                           PT Education - 07/11/15 1301    Education provided Yes   Education Details evaluation findings, POC, goals, HEP   Person(s) Educated Patient;Child(ren)   Methods Explanation   Comprehension Verbalized understanding          PT Short Term Goals - 07/11/15 1308    PT SHORT TERM GOAL #1   Title pt will be I with basic HEP (08/11/2015)   Time 4   Period Weeks   Status New   PT SHORT TERM  GOAL #2   Title She will increase bil hip/knee strength to > 4-/5 to assist with walking and standing strength to promote (08/11/2015)   Time 4   Period Weeks   Status New   PT SHORT TERM GOAL #3   Title pt will be able to stand/walking for > 10 minutes with LRAD for safety to increase walking/ standing endurance (08/11/2015)   Time 4   Period Weeks   Status New  PT SHORT TERM GOAL #4   Title She will be able to verbalize and demonstrates techniques to protect the spine by log rolling/transitions, postural awarenss, proper DME use, and HEP (08/11/2015)   Time 4   Period Weeks   Status New   PT SHORT TERM GOAL #5   Title She will increase her FOTO score by > 5 points to help with functional progression (08/11/2015)   Time 4   Period Weeks   Status New           PT Long Term Goals - 08-10-2015 1312    PT LONG TERM GOAL #1   Title upon discharge pt will be I with all HEP given throughout therapy (09/05/2015)   Time 8   Period Weeks   Status New   PT LONG TERM GOAL #2   Title pt will increase bil hip/knee strength to > 4/5 and ankle strenght to >4-/5 to help with safety during walking and prolonged standing with LRAD (09/05/2015)   Time 8   Period Weeks   Status New   PT LONG TERM GOAL #3   Title she will be able to perform walking/ standing for > 15 minutes with LRAD without stopping to rest to increase walking endurance and safety. (09/05/2015)   Time 8   Period Weeks   Status New   PT LONG TERM GOAL #4   Title she will increase her 10 MWT by > 10 seconds to help progress towards community ambulation to help with personal goal of being able to go shopping (09/05/2015)   Baseline .13 M/s inital evaluation   Time 8   Period Weeks   Status New   PT LONG TERM GOAL #5   Title She will increase her FOTO score to 29 to indicate improved functional capacity upon discharge (09/05/2015)   Time 8               Plan - 08-10-15 1301    Clinical Impression Statement Emmalynne  presents to OPPT with low back stifness and bil LE weakness s/p 2 spinal surgeries on 06/11/2016, and 06/21/2015 perfomring laminection and fusion at L5/L4. She currently ambulates with Rollator with limited step length bil , narrow BOS, and flexed trunk. She demonstrates limited trunk mobility, and moderate weakness in bil hips and knees with signifciant weaknes in bil ankles. She reports feeling unsteady but has no report of any falls, she demontrates 10 MWT of .13 M/s which is well below community amblation standard. She would benefit from skilled physical therapy to maxmize her function by addressing the impairments listed.    Pt will benefit from skilled therapeutic intervention in order to improve on the following deficits Decreased activity tolerance;Decreased balance;Decreased endurance;Decreased strength;Hypomobility;Difficulty walking;Decreased safety awareness;Postural dysfunction;Prosthetic Dependency;Improper body mechanics;Decreased mobility;Abnormal gait;Decreased range of motion;Impaired flexibility   Rehab Potential Good   PT Frequency 2x / week   PT Duration 8 weeks   PT Treatment/Interventions ADLs/Self Care Home Management;Cryotherapy;Moist Heat;DME Instruction;Gait training;Stair training;Functional mobility training;Therapeutic activities;Therapeutic exercise;Balance training;Neuromuscular re-education;Patient/family education;Manual techniques;Passive range of motion;Dry needling   PT Next Visit Plan assess response to HEP, bil LE strengthening, balance training, functional mobility training, (no taping frail skin)   PT Home Exercise Plan see HEP handout   Consulted and Agree with Plan of Care Patient;Family member/caregiver   Family Member Consulted daughter          G-Codes - Aug 10, 2015 1319    Functional Assessment Tool Used FOTO score 0/100   Functional Limitation Mobility: Walking  and moving around   Mobility: Walking and Moving Around Current Status [phone]) 100 percent  impaired, limited or restricted   Mobility: Walking and Moving Around Goal Status [phone]) At least 60 percent but less than 80 percent impaired, limited or restricted       Problem List Patient Active Problem List   Diagnosis Date Noted  . CAD (coronary artery disease) 03/03/2012  . HTN (hypertension) 03/03/2012  . Hyperlipidemia 03/03/2012  . GERD (gastroesophageal reflux disease) 03/03/2012   Starr Lake PT, DPT, LAT, ATC  07/11/2015  1:29 PM    [facility] [address] [place], [place], [address] Phone: [phone]   Fax:  [phone]

## 2015-07-13 IMAGING — CR DG HIP COMPLETE 2+V*R*
3 series · 3 of 3 positions shown · non-contrast
Comparison: None.

CLINICAL DATA: Fall.  Right hip pain.

EXAM:
RIGHT HIP - COMPLETE 2+ VIEW

[x pelvis]
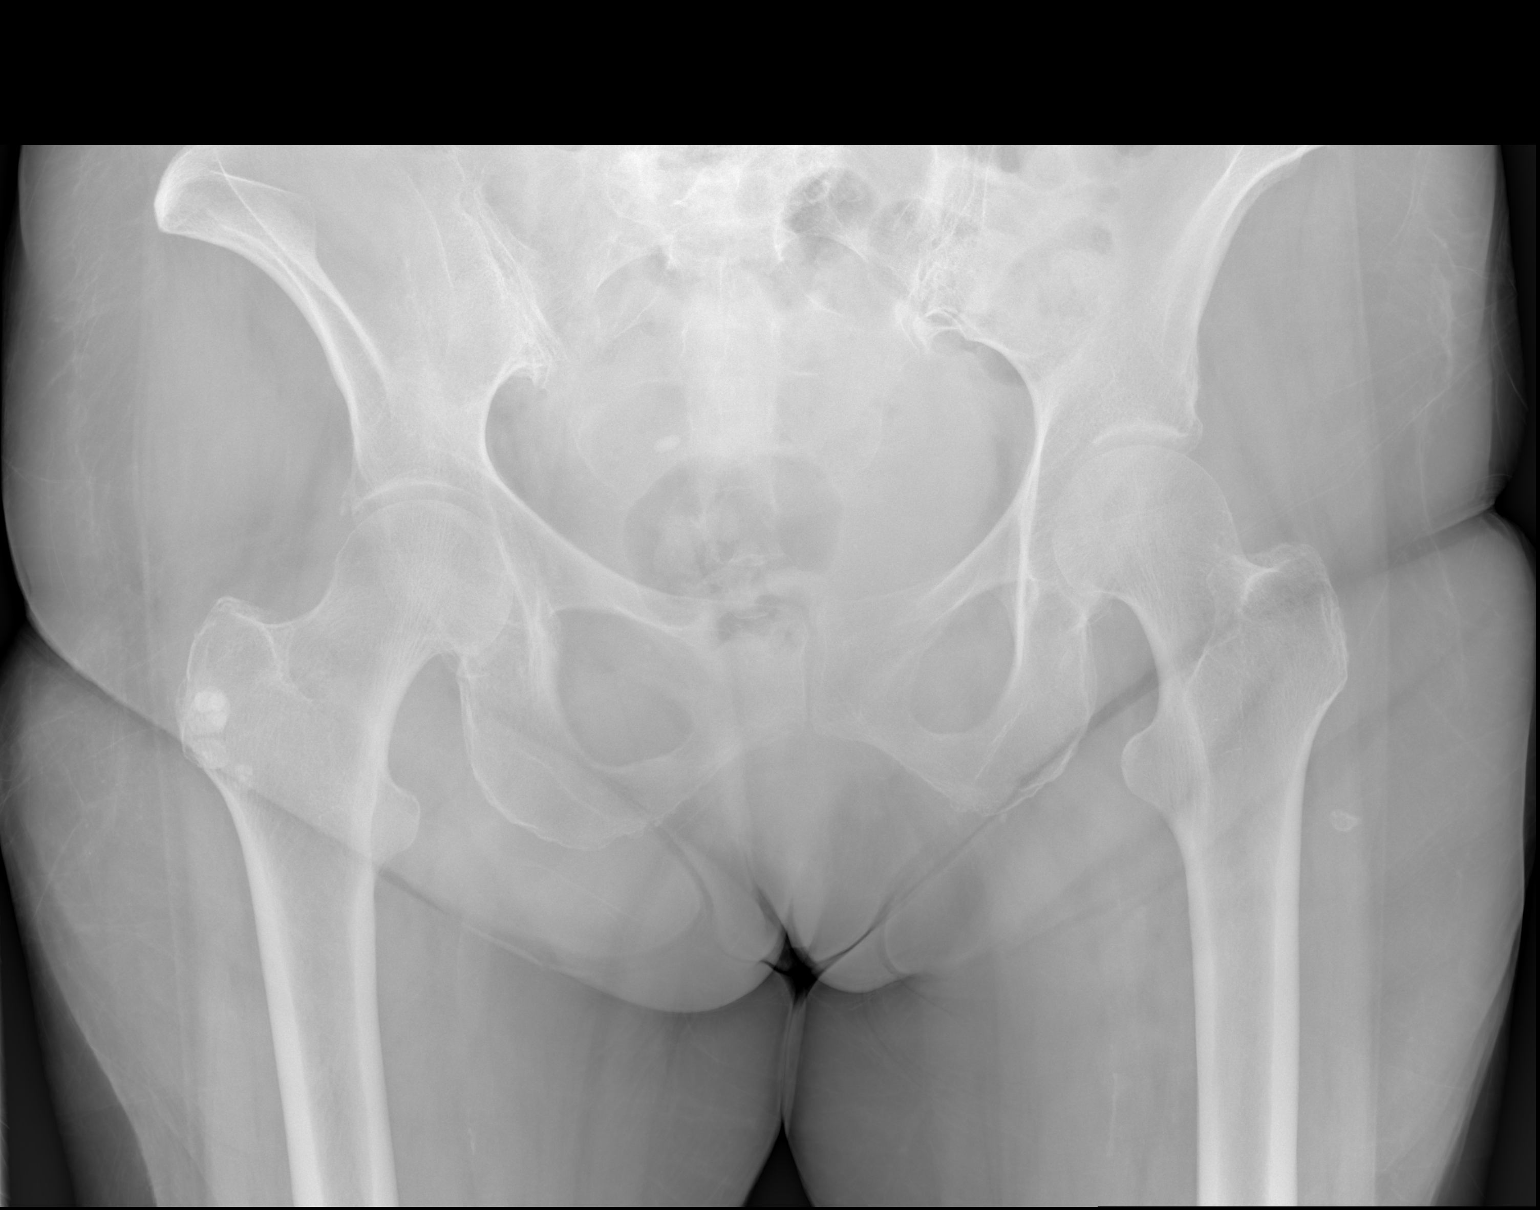

[x hip lat right]
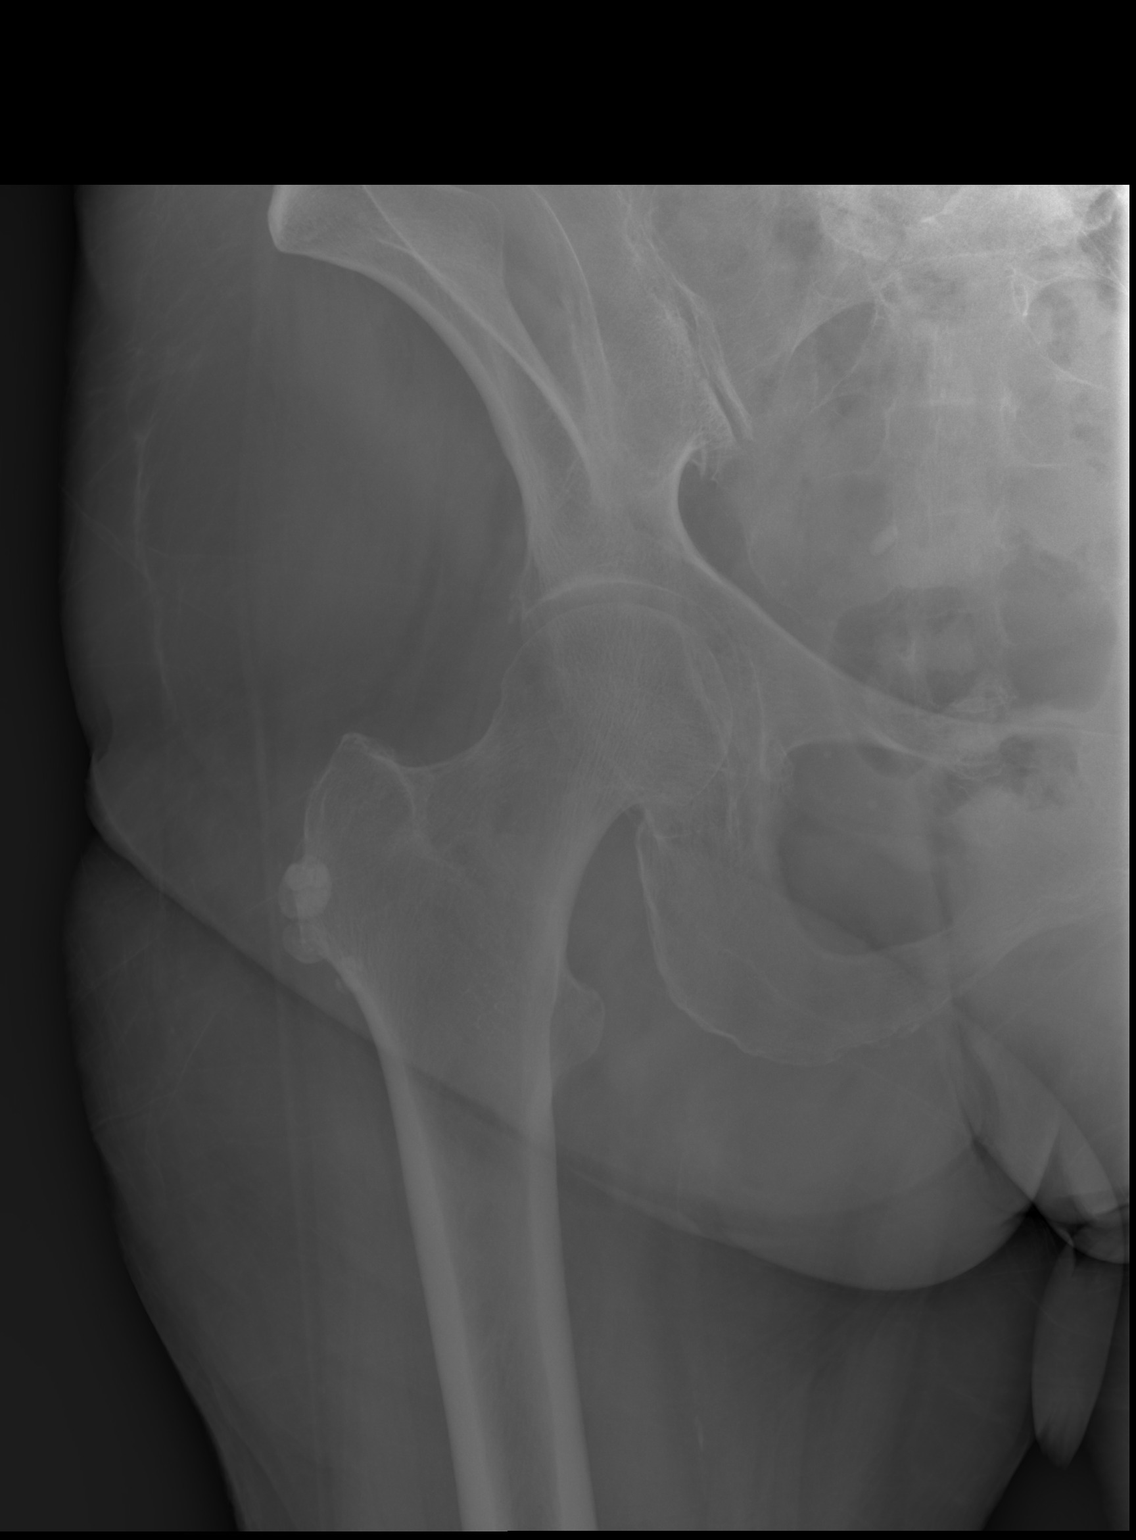

[x hip ap right]
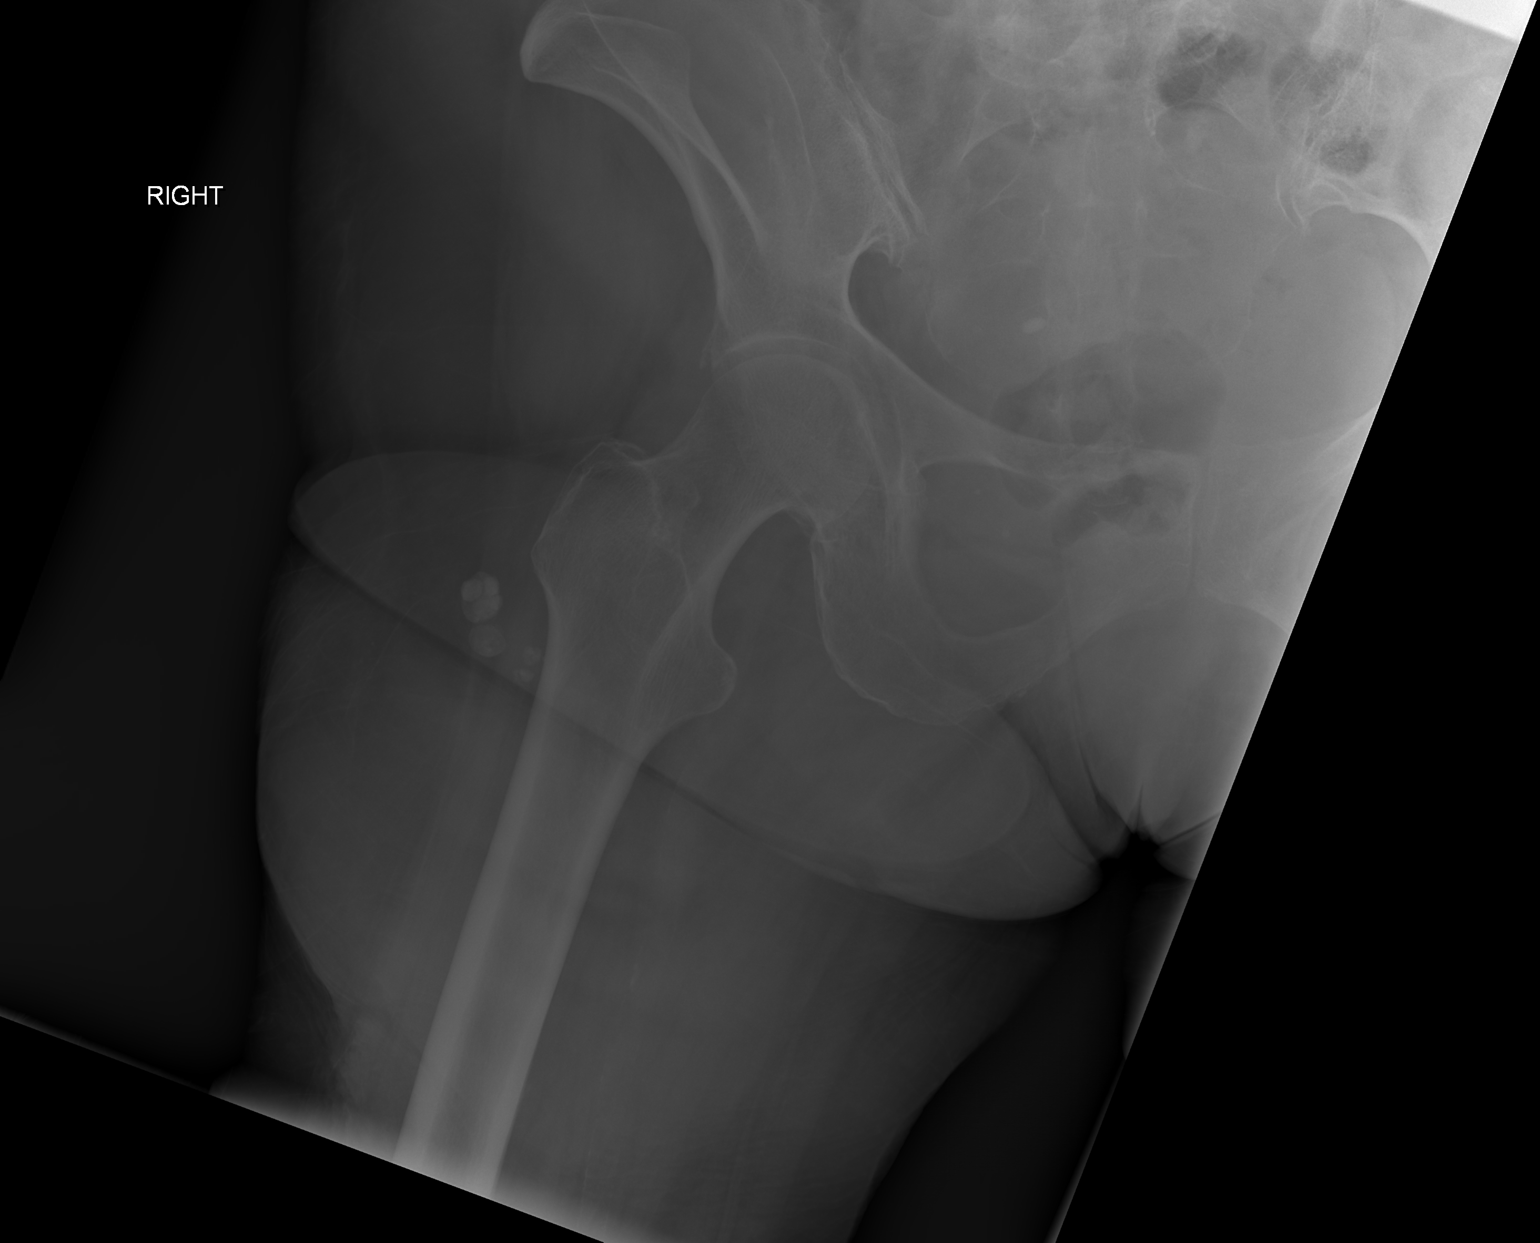

[3 of 3 positions shown; findings below may reference images not displayed]

FINDINGS: Right hip is located. Calcifications lateral to the greater
trochanter may be loose bodies within the trochanteric bursa. The
pelvis demonstrates degenerative changes at the SI joints
bilaterally. Joint space is preserved within the hips bilaterally.
Degenerative changes are noted in the lower lumbar spine.
IMPRESSION: 1. No acute abnormality.
2. Calcifications may represent loose bodies within the trochanteric
bursa.

## 2015-07-13 IMAGING — CT CT HEAD W/O CM
4 series · 18 of 37 positions shown, 19 images · non-contrast
Comparison: 05/30/2007 head CT. 04/26/2007 head CT and cervical
spine CT.

CLINICAL DATA: Fall. Did not hit head. Vertigo. Dizziness.
Right-sided weakness. No neck pain.

EXAM:
CT HEAD WITHOUT CONTRAST
CT CERVICAL SPINE WITHOUT CONTRAST
TECHNIQUE: Multidetector CT imaging of the head and cervical spine was
performed following the standard protocol without intravenous
contrast. Multiplanar CT image reconstructions of the cervical spine
were also generated.

[Series 3: head w/o · axial · non-contrast · 0.43mm/px · z∈[+1259,+1329]mm · 3 of 29 slices shown, 4 images]
[im 8/29  brain]
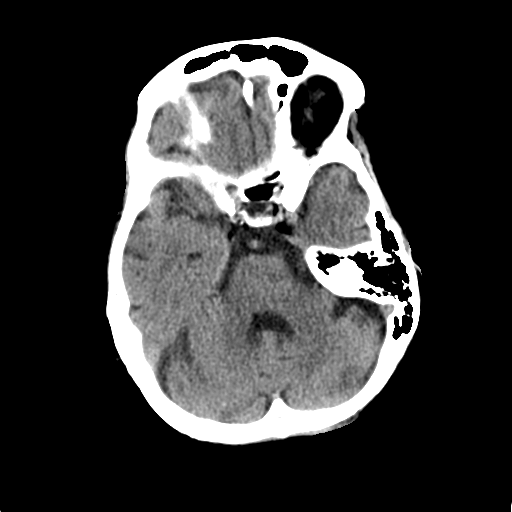
[im 8/29  bone]
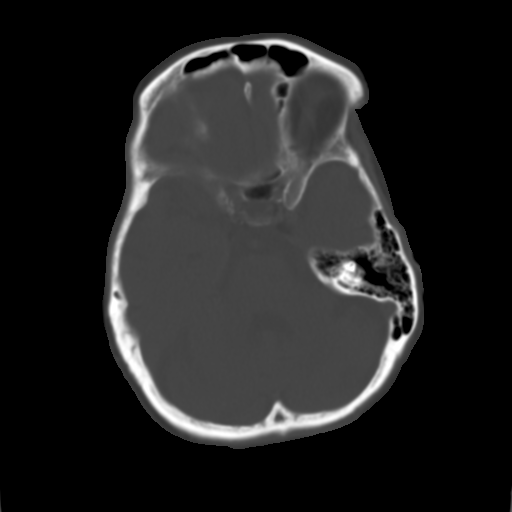
[im 15/29  brain]
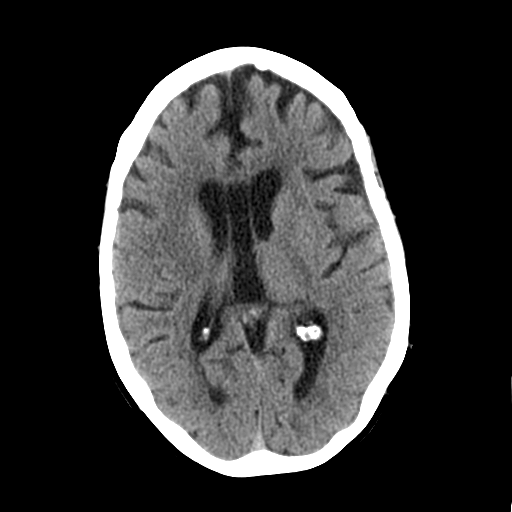
[im 22/29  brain]
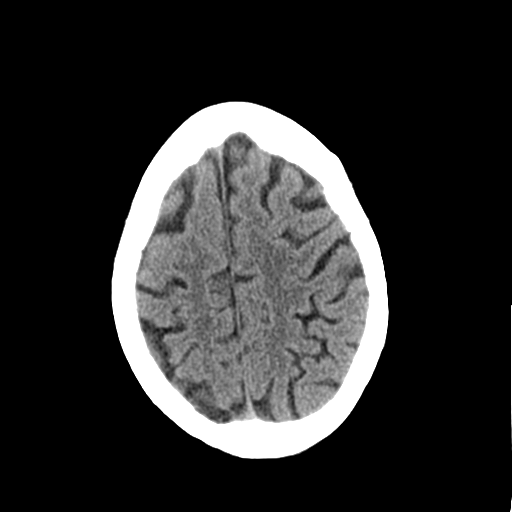

[Series 4: bone windows · axial · 0.43mm/px · z∈[+1242,+1350]mm · 7 of 49 slices shown]
[im 7/49  bone]
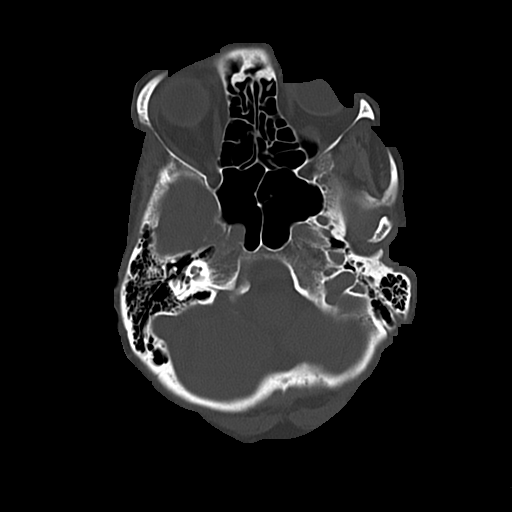
[im 13/49  bone]
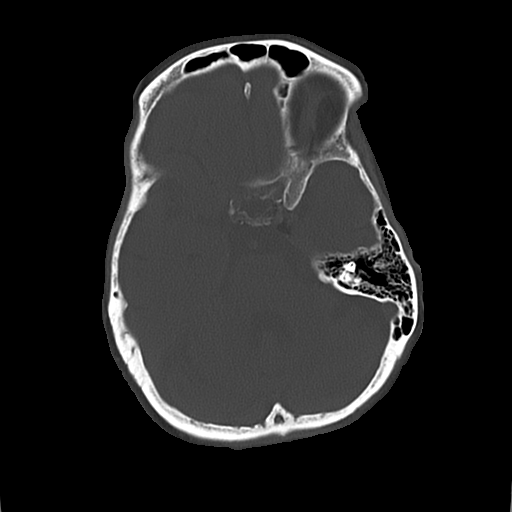
[im 19/49  bone]
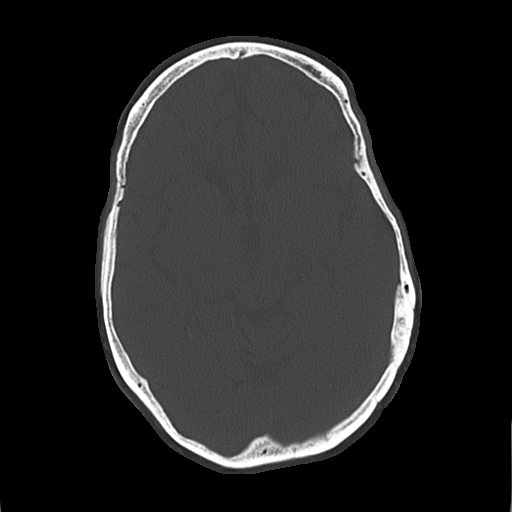
[im 25/49  bone]
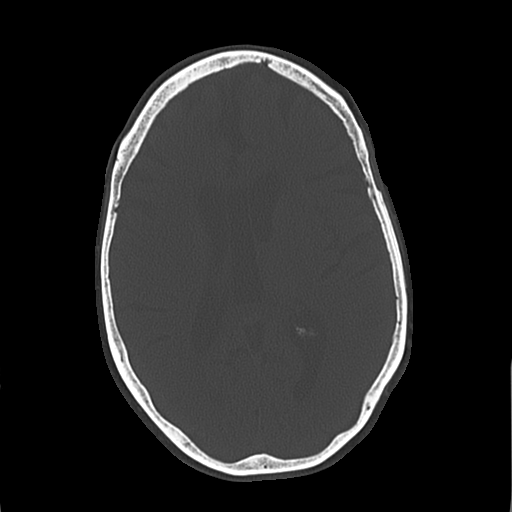
[im 31/49  bone]
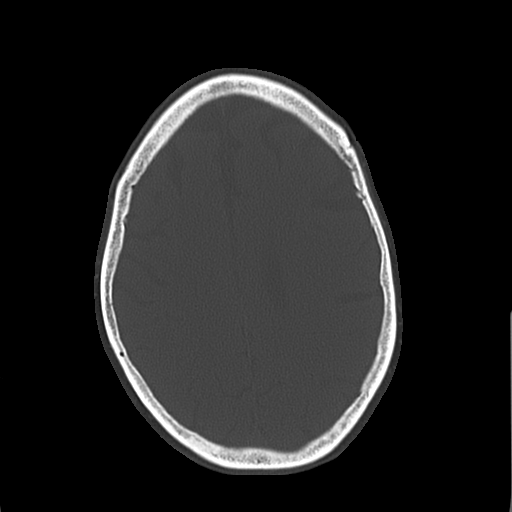
[im 37/49  bone]
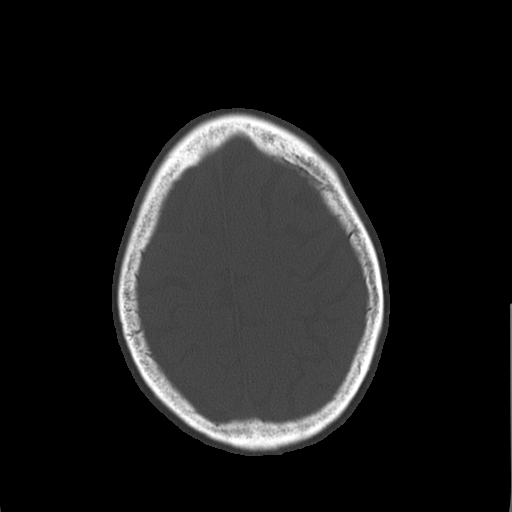
[im 43/49  bone]
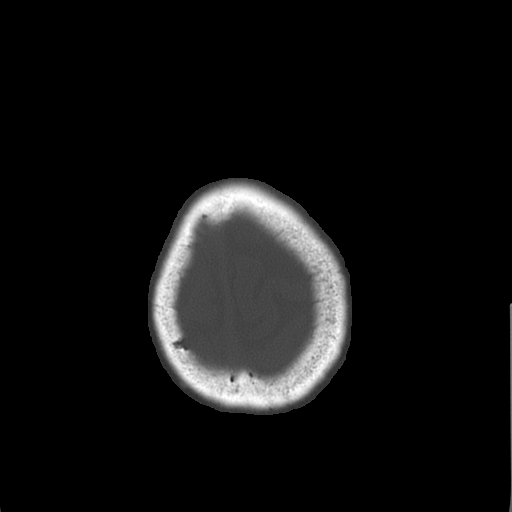

[Series 5: c-spine st · axial · 0.33mm/px · z∈[+1082,+1160]mm · 5 of 84 slices shown]
[im 6/84  brain]
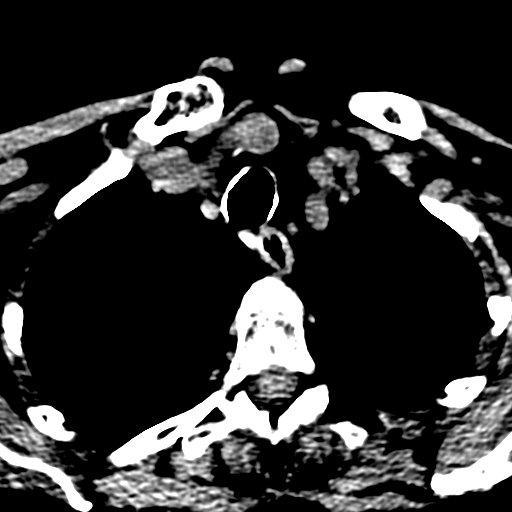
[im 17/84  brain]
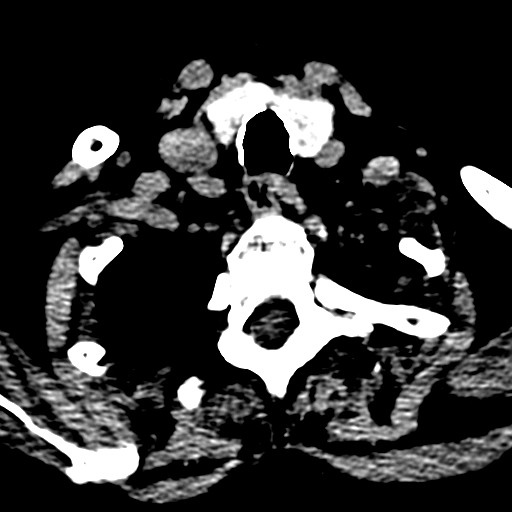
[im 28/84  brain]
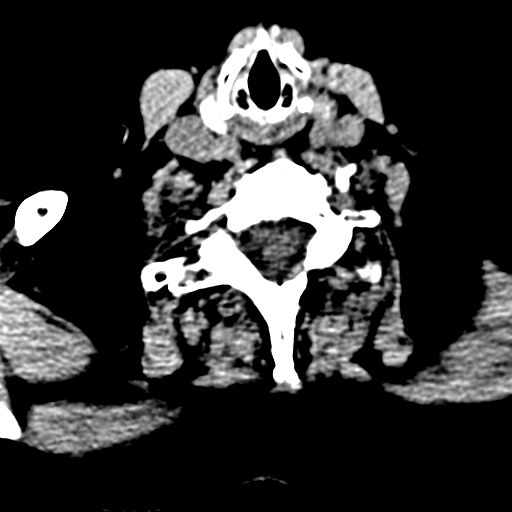
[im 39/84  brain]
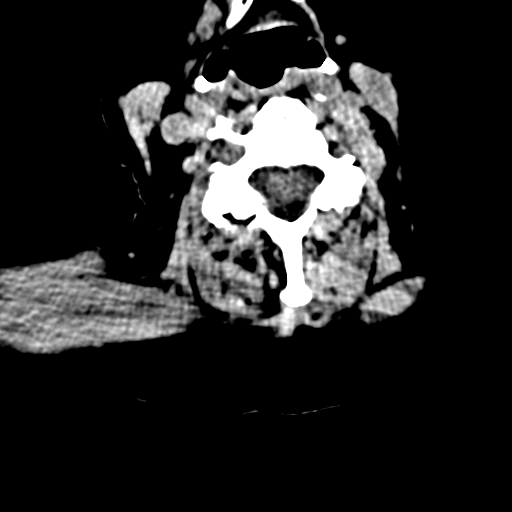
[im 45/84  brain]
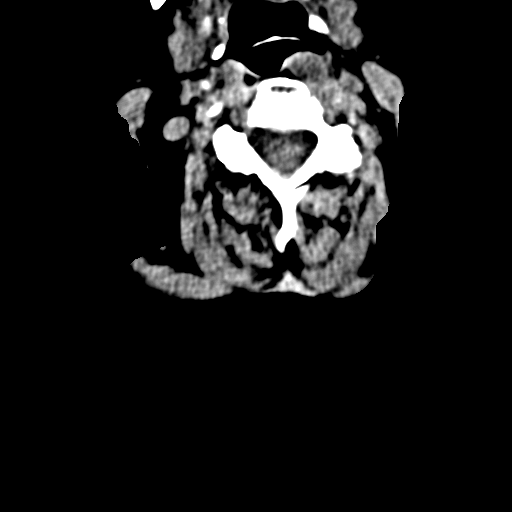

[Series 604: <mpr thick range(2)> · sagittal · 0.33mm/px · 3 of 69 slices shown]
[im 23/69  brain]
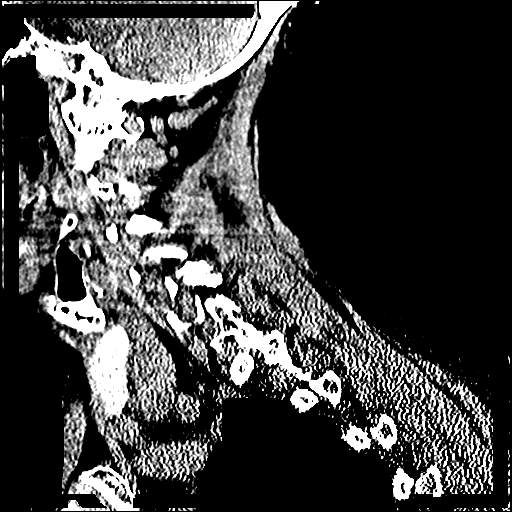
[im 35/69  brain]
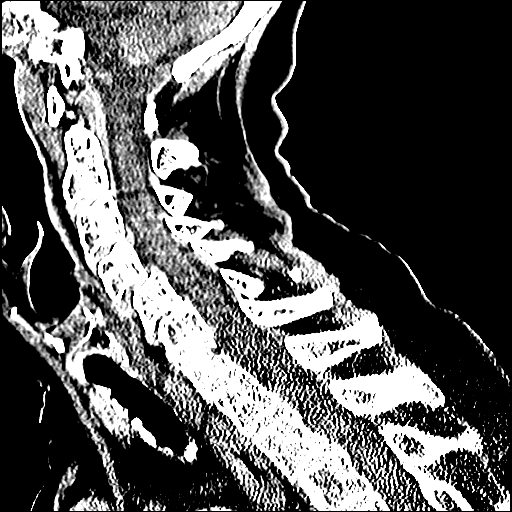
[im 46/69  brain]
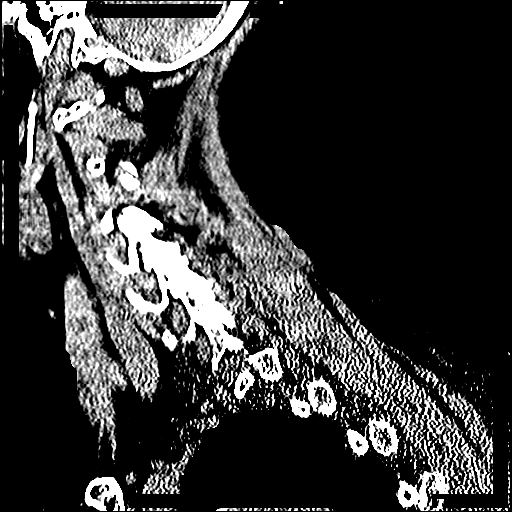

[18 of 37 positions shown; findings below may reference images not displayed]

FINDINGS: CT HEAD FINDINGS

No skull fracture or intracranial hemorrhage.

Atrophy without hydrocephalus.

Small vessel disease type changes without CT evidence of large acute
infarct.

Incidentally noted is a cavum septum pellucidum et vergae.

No intracranial mass lesion noted on this unenhanced exam.

Vascular calcifications.

CT CERVICAL SPINE FINDINGS

No cervical spine fracture

Progressive anterior slip of C4 (4 mm) felt to be related to facet
joint degenerative changes. If there is a high clinical suspicion of
ligamentous injury, flexion and extension views or MR can be
performed for further delineation.

Progressive degenerative changes C5-6 and C6-7.

No abnormal prevertebral soft tissue swelling.

Lung apices clear.

Left lobe of thyroid gland 1.8 cm lesion. This can be assessed with
elective thyroid ultrasound.
IMPRESSION: No skull fracture or intracranial hemorrhage.

No cervical spine fracture with degenerative changes as detailed
above.

1.8 cm left lobe of thyroid lesion can be assessed with elective
thyroid ultrasound.

## 2015-07-13 IMAGING — CR DG KNEE COMPLETE 4+V*R*
4 series · 4 of 4 positions shown · non-contrast
Comparison: None.

CLINICAL DATA: Fall

EXAM:
RIGHT KNEE - COMPLETE 4+ VIEW

[x knee ap right (1 of 3)]
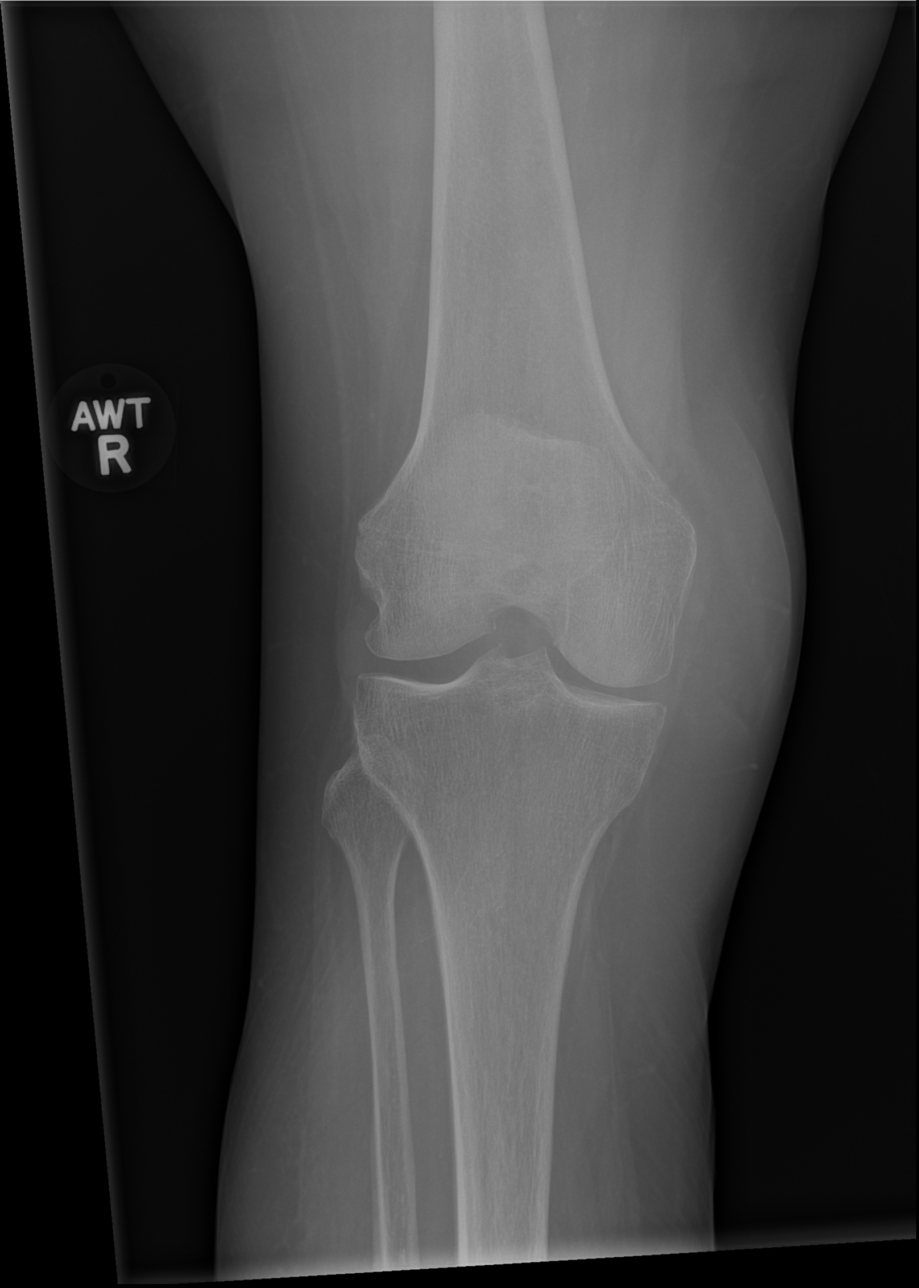

[x knee ap right (2 of 3)]
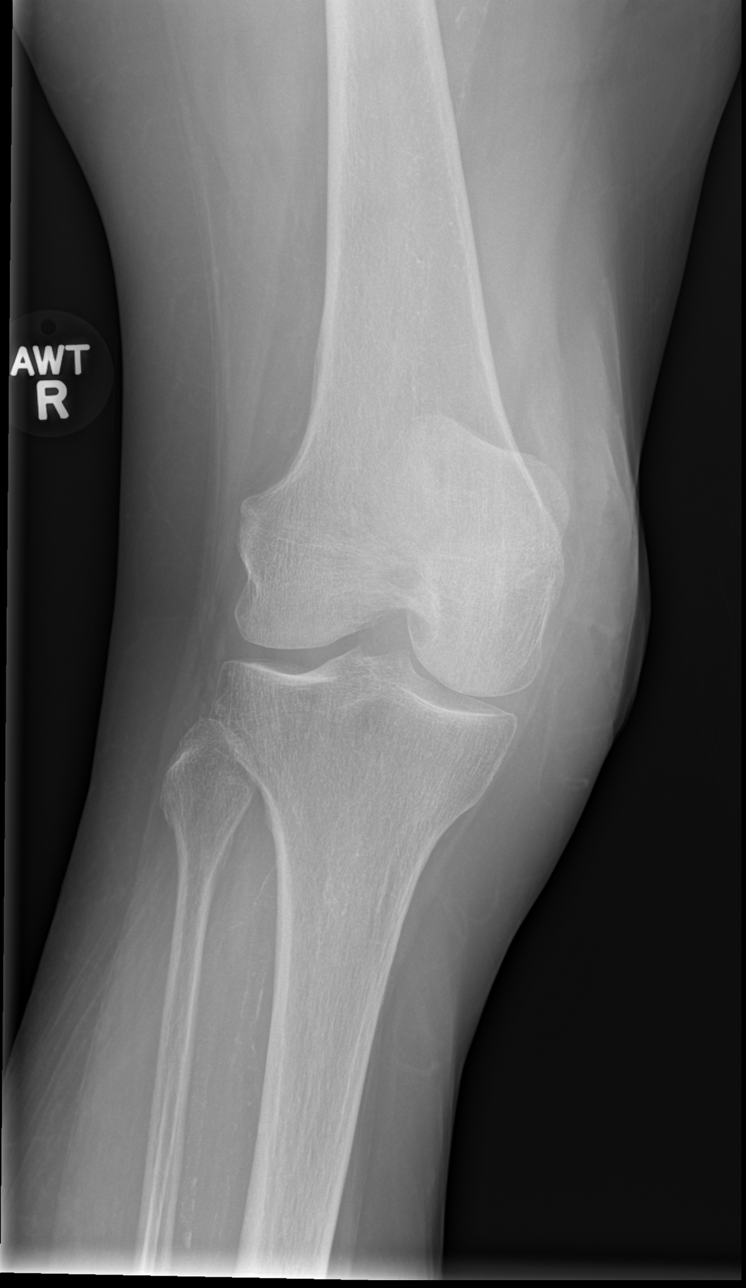

[x knee ap right (3 of 3)]
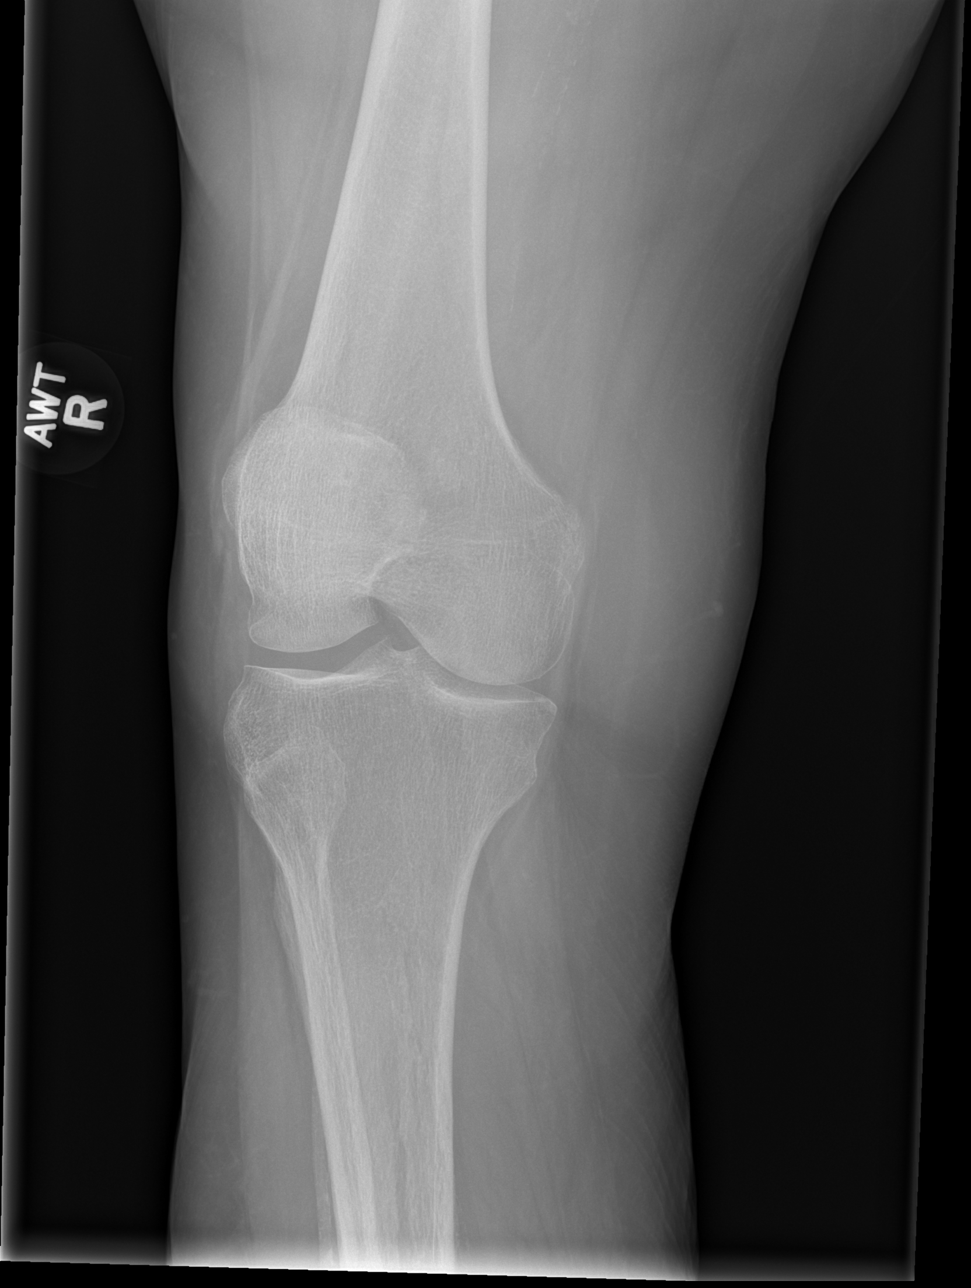

[x knee lat right]
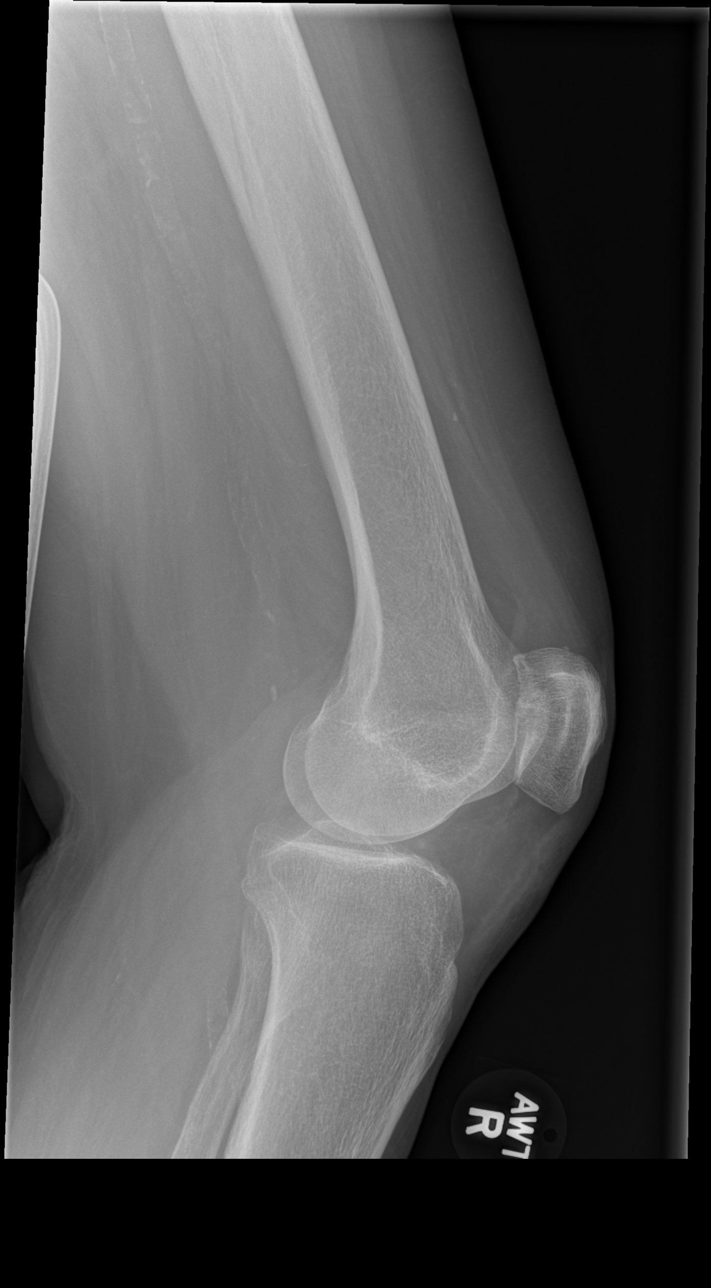

[4 of 4 positions shown; findings below may reference images not displayed]

FINDINGS: There is no evidence of fracture, dislocation, or joint effusion.
There is no evidence of arthropathy or other focal bone abnormality.
Soft tissues are unremarkable.
IMPRESSION: Negative.

## 2015-07-13 IMAGING — CR DG WRIST COMPLETE 3+V*L*
4 series · 4 of 4 positions shown · non-contrast
Comparison: None.

CLINICAL DATA: Fall

EXAM:
LEFT WRIST - COMPLETE 3+ VIEW

[x wrist pa left]
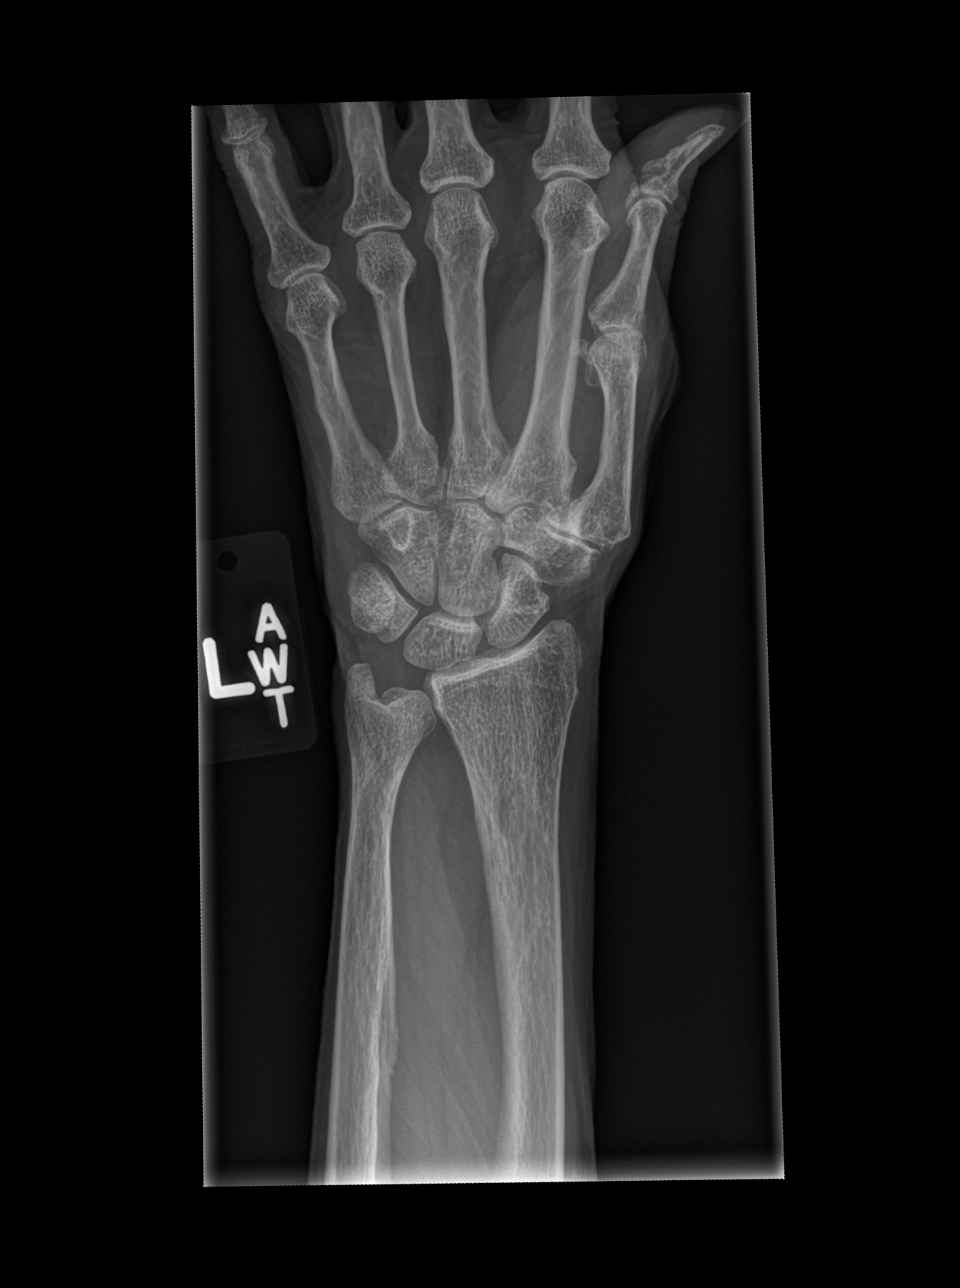

[x wrist obl left]
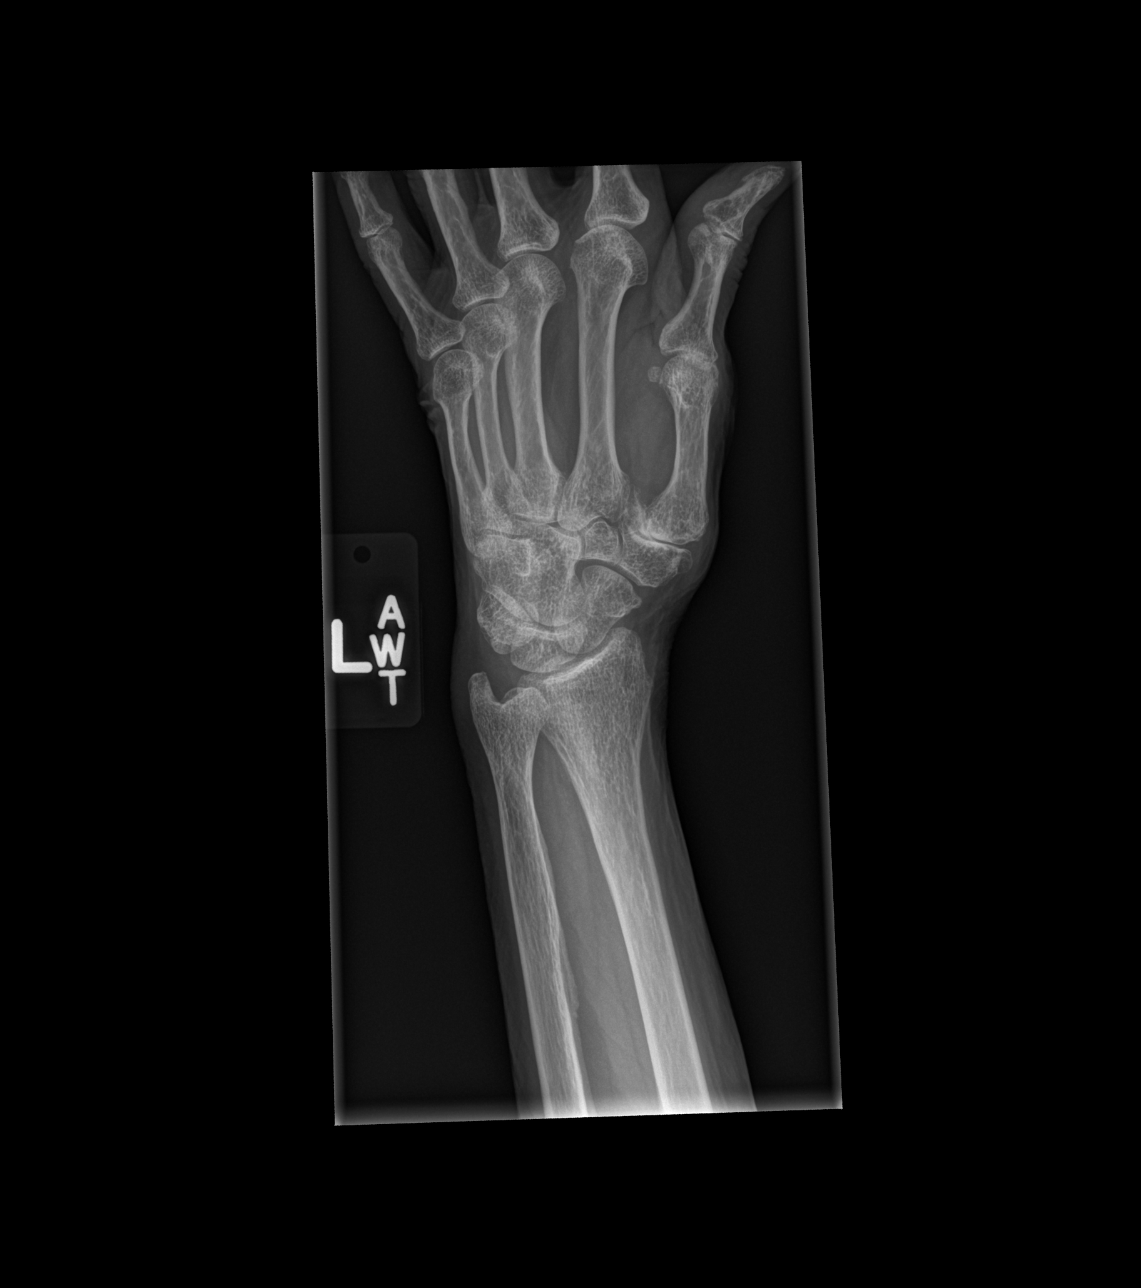

[x wrist lat left]
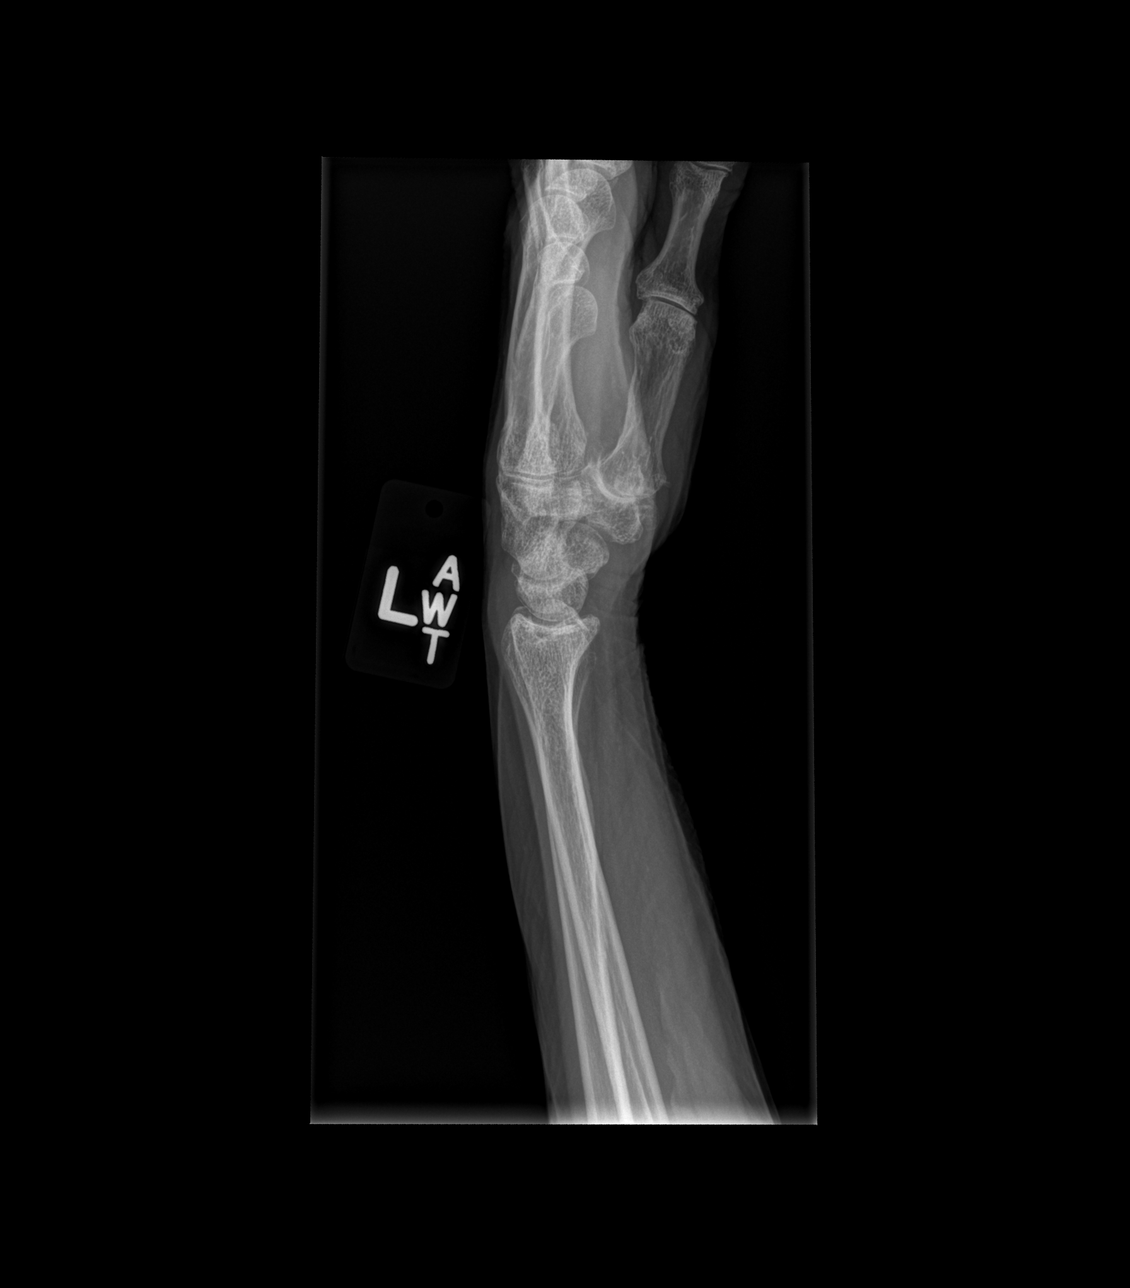

[x wrist navicular view left]
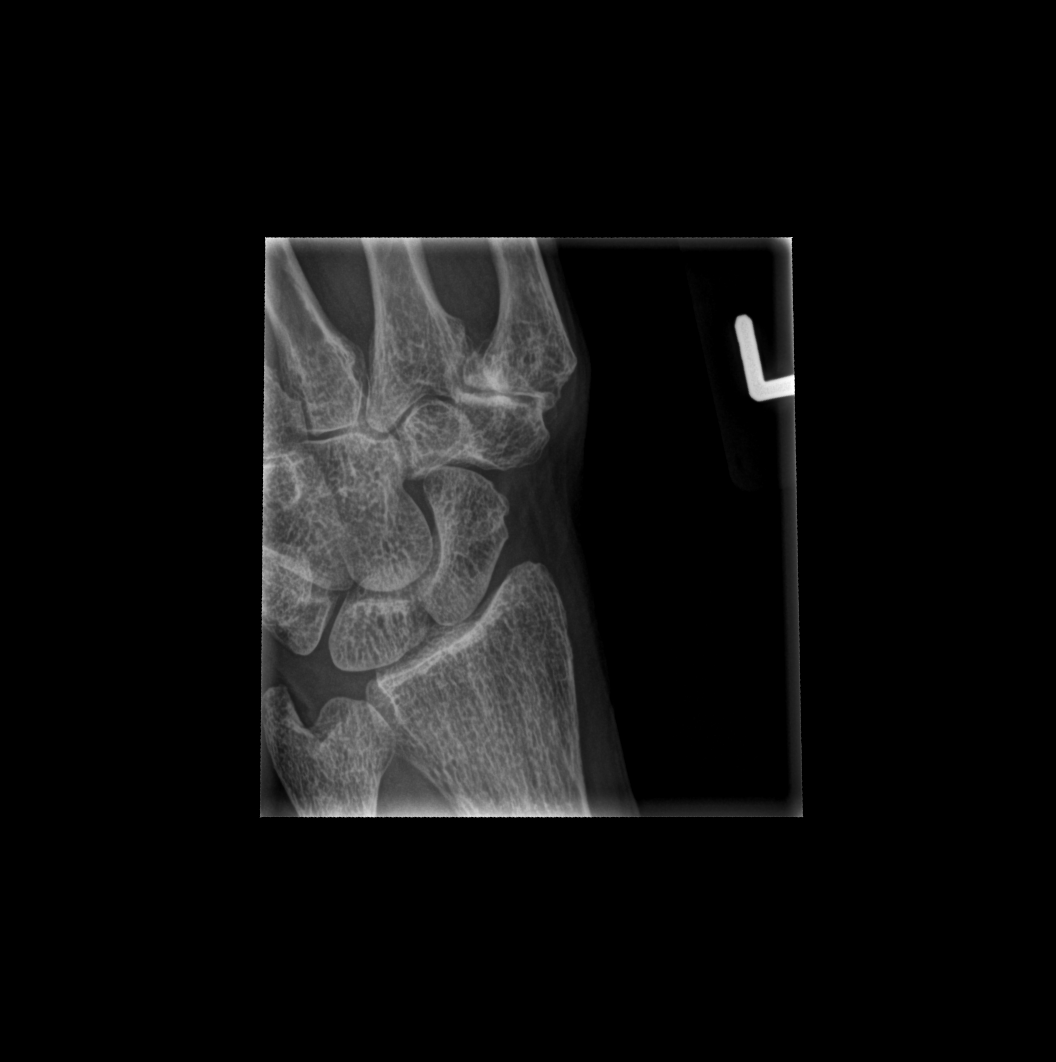

[4 of 4 positions shown; findings below may reference images not displayed]

FINDINGS: There is no evidence of fracture or dislocation. There is no
evidence of arthropathy or other focal bone abnormality. Soft
tissues are unremarkable. Degenerative change at the base of the
thumb.
IMPRESSION: Negative.

## 2015-07-14 DIAGNOSIS — M545 Low back pain: Secondary | ICD-10-CM | POA: Diagnosis not present

## 2015-07-14 DIAGNOSIS — M6281 Muscle weakness (generalized): Secondary | ICD-10-CM | POA: Diagnosis not present

## 2015-07-14 DIAGNOSIS — Z981 Arthrodesis status: Secondary | ICD-10-CM | POA: Diagnosis not present

## 2015-07-14 DIAGNOSIS — I82409 Acute embolism and thrombosis of unspecified deep veins of unspecified lower extremity: Secondary | ICD-10-CM | POA: Diagnosis not present

## 2015-07-14 DIAGNOSIS — Z7982 Long term (current) use of aspirin: Secondary | ICD-10-CM | POA: Diagnosis not present

## 2015-07-14 DIAGNOSIS — M21371 Foot drop, right foot: Secondary | ICD-10-CM | POA: Diagnosis not present

## 2015-07-14 DIAGNOSIS — Z9889 Other specified postprocedural states: Secondary | ICD-10-CM | POA: Insufficient documentation

## 2015-07-14 DIAGNOSIS — R200 Anesthesia of skin: Secondary | ICD-10-CM | POA: Diagnosis not present

## 2015-07-15 DIAGNOSIS — I129 Hypertensive chronic kidney disease with stage 1 through stage 4 chronic kidney disease, or unspecified chronic kidney disease: Secondary | ICD-10-CM | POA: Diagnosis not present

## 2015-07-15 DIAGNOSIS — F39 Unspecified mood [affective] disorder: Secondary | ICD-10-CM | POA: Diagnosis not present

## 2015-07-15 DIAGNOSIS — K219 Gastro-esophageal reflux disease without esophagitis: Secondary | ICD-10-CM | POA: Diagnosis not present

## 2015-07-15 DIAGNOSIS — K580 Irritable bowel syndrome with diarrhea: Secondary | ICD-10-CM | POA: Diagnosis not present

## 2015-07-15 DIAGNOSIS — M810 Age-related osteoporosis without current pathological fracture: Secondary | ICD-10-CM | POA: Diagnosis not present

## 2015-07-15 DIAGNOSIS — I25118 Atherosclerotic heart disease of native coronary artery with other forms of angina pectoris: Secondary | ICD-10-CM | POA: Diagnosis not present

## 2015-07-15 DIAGNOSIS — M5430 Sciatica, unspecified side: Secondary | ICD-10-CM | POA: Diagnosis not present

## 2015-07-15 DIAGNOSIS — E780 Pure hypercholesterolemia: Secondary | ICD-10-CM | POA: Diagnosis not present

## 2015-07-15 DIAGNOSIS — I82409 Acute embolism and thrombosis of unspecified deep veins of unspecified lower extremity: Secondary | ICD-10-CM | POA: Diagnosis not present

## 2015-07-15 DIAGNOSIS — N183 Chronic kidney disease, stage 3 (moderate): Secondary | ICD-10-CM | POA: Diagnosis not present

## 2015-07-22 DIAGNOSIS — I890 Lymphedema, not elsewhere classified: Secondary | ICD-10-CM | POA: Diagnosis not present

## 2015-07-22 DIAGNOSIS — Z79899 Other long term (current) drug therapy: Secondary | ICD-10-CM | POA: Diagnosis not present

## 2015-07-22 DIAGNOSIS — Z882 Allergy status to sulfonamides status: Secondary | ICD-10-CM | POA: Diagnosis not present

## 2015-07-22 DIAGNOSIS — Z888 Allergy status to other drugs, medicaments and biological substances status: Secondary | ICD-10-CM | POA: Diagnosis not present

## 2015-07-22 DIAGNOSIS — I10 Essential (primary) hypertension: Secondary | ICD-10-CM | POA: Diagnosis not present

## 2015-07-22 DIAGNOSIS — L909 Atrophic disorder of skin, unspecified: Secondary | ICD-10-CM | POA: Diagnosis not present

## 2015-07-22 DIAGNOSIS — Z885 Allergy status to narcotic agent status: Secondary | ICD-10-CM | POA: Diagnosis not present

## 2015-07-22 DIAGNOSIS — R234 Changes in skin texture: Secondary | ICD-10-CM | POA: Diagnosis not present

## 2015-07-23 DIAGNOSIS — R234 Changes in skin texture: Secondary | ICD-10-CM | POA: Insufficient documentation

## 2015-07-25 DIAGNOSIS — I82401 Acute embolism and thrombosis of unspecified deep veins of right lower extremity: Secondary | ICD-10-CM | POA: Diagnosis not present

## 2015-07-25 DIAGNOSIS — D649 Anemia, unspecified: Secondary | ICD-10-CM | POA: Diagnosis not present

## 2015-07-25 DIAGNOSIS — M549 Dorsalgia, unspecified: Secondary | ICD-10-CM | POA: Diagnosis not present

## 2015-07-25 DIAGNOSIS — R8290 Unspecified abnormal findings in urine: Secondary | ICD-10-CM | POA: Diagnosis not present

## 2015-07-29 ENCOUNTER — Ambulatory Visit: Payer: Medicare Other | Attending: Surgical | Admitting: Physical Therapy

## 2015-07-29 DIAGNOSIS — R293 Abnormal posture: Secondary | ICD-10-CM | POA: Diagnosis not present

## 2015-07-29 DIAGNOSIS — R29898 Other symptoms and signs involving the musculoskeletal system: Secondary | ICD-10-CM | POA: Diagnosis not present

## 2015-07-29 DIAGNOSIS — R2681 Unsteadiness on feet: Secondary | ICD-10-CM | POA: Diagnosis not present

## 2015-07-29 DIAGNOSIS — Z9181 History of falling: Secondary | ICD-10-CM | POA: Diagnosis not present

## 2015-07-29 NOTE — Patient Instructions (Signed)
   Milton Streicher PT, DPT, LAT, ATC  [facility] Phone: [phone]     

## 2015-07-29 NOTE — Therapy (Signed)
Molly [name], [place], [address] Phone: [phone]   Fax:  [phone]  Physical Therapy Treatment  Patient Details  Name: Molly [name] MRN: [id number] Date of Birth: Jan 14, 1929 Referring Provider:  [name], MD  Encounter Date: 07/29/2015      PT End of Session - 07/29/15 1515    Visit Number 2   Number of Visits 16   Date for PT Re-Evaluation 09/05/15   Authorization Type Medicare KX modifier by the 15th visit, progress note on 8th or 9th visit.    PT Start Time 1415   PT Stop Time 1500   PT Time Calculation (min) 45 min   Activity Tolerance Patient tolerated treatment well   Behavior During Therapy WFL for tasks assessed/performed      Past Medical History  Diagnosis Date  . CAD (coronary artery disease)   . Spinal stenosis   . Acid reflux   . Arthritis   . Osteoporosis   . Hyperlipidemia   . High cholesterol   . Hypertension   . Anemia     Past Surgical History  Procedure Laterality Date  . Carotid stent    . Abdominal hysterectomy      There were no vitals filed for this visit.  Visit Diagnosis:  Weakness of both legs  Unsteadiness  Risk for falls  Abnormal posture      Subjective Assessment - 07/29/15 1436    Subjective "The exercises are going, sometimes I haven't been able to do it because I have been so exhausted"   Currently in Pain? Yes   Pain Score 2    Pain Location --  groin   Pain Orientation Right   Pain Type Surgical pain   Pain Onset More than a month ago   Pain Frequency Constant                         [facility] Adult PT Treatment/Exercise - 07/29/15 1447    Self-Care   Self-Care Other Self-Care Comments   Other Self-Care Comments  performing HEP and review of HEP.  Also how to control swelling of the ankles by elevation and light compression    Knee/Hip Exercises: Aerobic   Nustep L6 x 8 min   Knee/Hip Exercises: Seated    Long Arc Quad AROM;AAROM;Both;1 set;15 reps   Marching AROM;Strengthening;Both;2 sets;10 reps   Abduction/Adduction  AROM;AAROM;Strengthening;Both;1 set;15 reps  squeezing ball.   Sit to Sand 1 set;5 reps;with UE support   Ankle Exercises: Seated   Other Seated Ankle Exercises ankle eversion, inversion, PF/DF x 10 each                PT Education - 07/29/15 1515    Education provided Yes   Education Details updated HEP, HEP review   Person(s) Educated Patient   Methods Explanation   Comprehension Verbalized understanding          PT Short Term Goals - 07/29/15 1526    PT SHORT TERM GOAL #1   Title pt will be I with basic HEP (08/11/2015)   Time 4   Period Weeks   Status On-going   PT SHORT TERM GOAL #2   Title She will increase bil hip/knee strength to > 4-/5 to assist with walking and standing strength to promote (08/11/2015)   Time 4   Period Weeks   Status On-going   PT SHORT TERM GOAL #3   Title pt will be  able to stand/walking for > 10 minutes with LRAD for safety to increase walking/ standing endurance (08/11/2015)   Time 4   Period Weeks   Status On-going   PT SHORT TERM GOAL #4   Title She will be able to verbalize and demonstrates techniques to protect the spine by log rolling/transitions, postural awarenss, proper DME use, and HEP (08/11/2015)   Time 4   Period Weeks   Status On-going   PT SHORT TERM GOAL #5   Title She will increase her FOTO score by > 5 points to help with functional progression (08/11/2015)   Time 4   Period Weeks   Status On-going           PT Long Term Goals - 07/29/15 1526    PT LONG TERM GOAL #1   Title upon discharge pt will be I with all HEP given throughout therapy (09/05/2015)   Time 8   Period Weeks   Status On-going   PT LONG TERM GOAL #2   Title pt will increase bil hip/knee strength to > 4/5 and ankle strenght to >4-/5 to help with safety during walking and prolonged standing with LRAD (09/05/2015)   Time 8    Period Weeks   Status On-going   PT LONG TERM GOAL #3   Title she will be able to perform walking/ standing for > 15 minutes with LRAD without stopping to rest to increase walking endurance and safety. (09/05/2015)   Time 8   Period Weeks   Status On-going   PT LONG TERM GOAL #4   Title she will increase her 10 MWT by > 10 seconds to help progress towards community ambulation to help with personal goal of being able to go shopping (09/05/2015)   Baseline .13 M/s inital evaluation   Time 8   Period Weeks   Status On-going   PT LONG TERM GOAL #5   Title She will increase her FOTO score to 29 to indicate improved functional capacity upon discharge (09/05/2015)   Time 8   Period Weeks   Status On-going               Plan - 07/29/15 1516    Clinical Impression Statement [name] present to therapy today stating she has not been doing the exercises she was given last visit because she thought we would do them at the clinic. Reviewed HEP thoroughly with daughter present explaining if she needs to do these exercises at home.  pt wsa able to perform exercises wiht only complant that she was tired.  utilized todays visit to educate about home exercise and ways to control swelling in the ankles as well as updating HEP to include ankle strengthening.    PT Next Visit Plan Review HEP, bil LE strengthening, balance training, functional mobility training, (no taping frail skin)   PT Home Exercise Plan ankle inversion, eversion, PF, and Inversion   Consulted and Agree with Plan of Care Patient        Problem List Patient Active Problem List   Diagnosis Date Noted  . CAD (coronary artery disease) 03/03/2012  . HTN (hypertension) 03/03/2012  . Hyperlipidemia 03/03/2012  . GERD (gastroesophageal reflux disease) 03/03/2012   Molly [name] PT, DPT, LAT, ATC  07/29/2015  3:28 PM    [facility] [address] [place], [place],  [address] Phone: [phone]   Fax:  [phone]

## 2015-07-31 ENCOUNTER — Ambulatory Visit: Payer: Medicare Other | Admitting: Physical Therapy

## 2015-08-04 ENCOUNTER — Ambulatory Visit: Payer: Medicare Other | Admitting: Physical Therapy

## 2015-08-04 DIAGNOSIS — R29898 Other symptoms and signs involving the musculoskeletal system: Secondary | ICD-10-CM | POA: Diagnosis not present

## 2015-08-04 DIAGNOSIS — R2681 Unsteadiness on feet: Secondary | ICD-10-CM

## 2015-08-04 DIAGNOSIS — Z9181 History of falling: Secondary | ICD-10-CM

## 2015-08-04 DIAGNOSIS — R293 Abnormal posture: Secondary | ICD-10-CM | POA: Diagnosis not present

## 2015-08-04 NOTE — Therapy (Signed)
[place], [place], [address] Phone: [phone]   Fax:  [phone]  Physical Therapy Treatment  Patient Details  Name: Molly [name] MRN: [id number] Date of Birth: [birth year] Referring Provider:  [name], MD  Encounter Date: 08/04/2015      PT End of Session - 08/04/15 1256    Visit Number 3   Number of Visits 16   Date for PT Re-Evaluation 09/05/15   Authorization Type Medicare KX modifier by the 15th visit, progress note on 8th or 9th visit.    PT Start Time 1100   PT Stop Time 1145   PT Time Calculation (min) 45 min   Activity Tolerance Patient tolerated treatment well   Behavior During Therapy WFL for tasks assessed/performed      Past Medical History  Diagnosis Date  . CAD (coronary artery disease)   . Spinal stenosis   . Acid reflux   . Arthritis   . Osteoporosis   . Hyperlipidemia   . High cholesterol   . Hypertension   . Anemia     Past Surgical History  Procedure Laterality Date  . Carotid stent    . Abdominal hysterectomy      There were no vitals filed for this visit.  Visit Diagnosis:  Weakness of both legs  Unsteadiness  Risk for falls  Abnormal posture      Subjective Assessment - 08/04/15 1112    Subjective " I still can't do the bed exercises because I can't get by foot up onto the bed" pt reports feeling more sore today in bil hips/posterior glute region.    Currently in Pain? Yes   Pain Score 2    Pain Location Hip   Pain Orientation Left;Right  R>L   Pain Descriptors / Indicators Aching   Pain Type Surgical pain   Pain Onset More than a month ago   Pain Frequency Constant   Aggravating Factors  getting in and out of bed, and standing for long periods of time   Pain Relieving Factors N/A                         [facility] Adult PT Treatment/Exercise - 08/04/15 1114    Knee/Hip Exercises: Aerobic   Nustep L5 x 8 min   Knee/Hip Exercises: Standing   Other Standing Knee Exercises standing hip extension/abd 2 x 10    Knee/Hip Exercises: Seated   Long Arc Quad AROM;AAROM;Both;10 reps;2 sets   Long Arc Quad Weight 2 lbs.   Marching AROM;Strengthening;Both;2 sets;10 reps   Abduction/Adduction  AROM;AAROM;Strengthening;Both;1 set;15 reps   Sit to Sand 1 set;5 reps;with UE support   Ankle Exercises: Seated   Other Seated Ankle Exercises ankle eversion, inversion, PF/DF x 15 each  while keeping knee extended with 2# weights                PT Education - 08/04/15 1256    Education provided Yes   Education Details HEP review   Person(s) Educated Patient   Methods Explanation   Comprehension Verbalized understanding          PT Short Term Goals - 07/29/15 1526    PT SHORT TERM GOAL #1   Title pt will be I with basic HEP (08/11/2015)   Time 4   Period Weeks   Status On-going   PT SHORT TERM GOAL #2   Title She will increase bil hip/knee strength to > 4-/5  to assist with walking and standing strength to promote (08/11/2015)   Time 4   Period Weeks   Status On-going   PT SHORT TERM GOAL #3   Title pt will be able to stand/walking for > 10 minutes with LRAD for safety to increase walking/ standing endurance (08/11/2015)   Time 4   Period Weeks   Status On-going   PT SHORT TERM GOAL #4   Title She will be able to verbalize and demonstrates techniques to protect the spine by log rolling/transitions, postural awarenss, proper DME use, and HEP (08/11/2015)   Time 4   Period Weeks   Status On-going   PT SHORT TERM GOAL #5   Title She will increase her FOTO score by > 5 points to help with functional progression (08/11/2015)   Time 4   Period Weeks   Status On-going           PT Long Term Goals - 07/29/15 1526    PT LONG TERM GOAL #1   Title upon discharge pt will be I with all HEP given throughout therapy (09/05/2015)   Time 8   Period Weeks   Status On-going   PT LONG TERM GOAL #2    Title pt will increase bil hip/knee strength to > 4/5 and ankle strenght to >4-/5 to help with safety during walking and prolonged standing with LRAD (09/05/2015)   Time 8   Period Weeks   Status On-going   PT LONG TERM GOAL #3   Title she will be able to perform walking/ standing for > 15 minutes with LRAD without stopping to rest to increase walking endurance and safety. (09/05/2015)   Time 8   Period Weeks   Status On-going   PT LONG TERM GOAL #4   Title she will increase her 10 MWT by > 10 seconds to help progress towards community ambulation to help with personal goal of being able to go shopping (09/05/2015)   Baseline .13 M/s inital evaluation   Time 8   Period Weeks   Status On-going   PT LONG TERM GOAL #5   Title She will increase her FOTO score to 29 to indicate improved functional capacity upon discharge (09/05/2015)   Time 8   Period Weeks   Status On-going               Plan - 08/04/15 1257    Clinical Impression Statement Molly [name] reports mild soreness in bil posterior glutes possibly due to muscle soreness. She was able to perform all exercises well without complaint of pain except for pain during the transition from sit<>supine for clam shell exercise. Plan to progress with increased CKC exercises.    PT Next Visit Plan Review HEP, bil LE strengthening, balance training, functional mobility training, CKC Bil LE strengthening (no taping frail skin)   PT Home Exercise Plan HEP review   Consulted and Agree with Plan of Care Patient;Family member/caregiver   Family Member Consulted daughter        Problem List Patient Active Problem List   Diagnosis Date Noted  . CAD (coronary artery disease) 03/03/2012  . HTN (hypertension) 03/03/2012  . Hyperlipidemia 03/03/2012  . GERD (gastroesophageal reflux disease) 03/03/2012   Starr Lake PT, DPT, LAT, ATC  08/04/2015  1:00 PM      [facility] [address] [place], [place], [address] Phone: [phone]   Fax:  [phone]

## 2015-08-05 ENCOUNTER — Other Ambulatory Visit: Payer: Self-pay | Admitting: *Deleted

## 2015-08-05 ENCOUNTER — Encounter: Payer: Self-pay | Admitting: *Deleted

## 2015-08-05 MED ORDER — FUROSEMIDE 40 MG PO TABS: 40.0000 mg | ORAL_TABLET | Freq: Every day | ORAL | Status: DC

## 2015-08-07 ENCOUNTER — Ambulatory Visit: Payer: Medicare Other | Admitting: Physical Therapy

## 2015-08-07 DIAGNOSIS — R29898 Other symptoms and signs involving the musculoskeletal system: Secondary | ICD-10-CM | POA: Diagnosis not present

## 2015-08-07 DIAGNOSIS — Z9181 History of falling: Secondary | ICD-10-CM

## 2015-08-07 DIAGNOSIS — R2681 Unsteadiness on feet: Secondary | ICD-10-CM

## 2015-08-07 DIAGNOSIS — R293 Abnormal posture: Secondary | ICD-10-CM

## 2015-08-07 NOTE — Therapy (Signed)
[place], [place], [address] Phone: [phone]   Fax:  [phone]  Physical Therapy Treatment  Patient Details  Name: Molly [name] MRN: [id number] Date of Birth: March 23, 1929 Referring Provider:  [name], MD  Encounter Date: 08/07/2015      PT End of Session - 08/07/15 1716    Visit Number 4   Number of Visits 16   Date for PT Re-Evaluation 09/05/15   PT Start Time 0933   PT Stop Time 1020   PT Time Calculation (min) 47 min   Activity Tolerance Patient tolerated treatment well;No increased pain   Behavior During Therapy [facility] for tasks assessed/performed      Past Medical History  Diagnosis Date  . CAD (coronary artery disease)   . Spinal stenosis   . Acid reflux   . Arthritis   . Osteoporosis   . Hyperlipidemia   . High cholesterol   . Hypertension   . Anemia     Past Surgical History  Procedure Laterality Date  . Carotid stent    . Abdominal hysterectomy      There were no vitals filed for this visit.  Visit Diagnosis:  Weakness of both legs  Unsteadiness  Risk for falls  Abnormal posture      Subjective Assessment - 08/07/15 0942    Subjective Needs help to tie the bands.  Gets help to get leg up on bed.  No pain ,w                         [facility] Adult PT Treatment/Exercise - 08/07/15 0935    Transfers   Five time sit to stand comments  --  1 rep with cues for techniques   Comments cues for technique. Min assist with legs in , cues only for supine to sit.  Dizzy briefly.   Knee/Hip Exercises: Aerobic   Nustep L5 X 7 minutes   Knee/Hip Exercises: Seated   Sit to Sand --  1 rep,  cues for posture prior to moving   Knee/Hip Exercises: Supine   Heel Slides 5 reps;2 sets  AA RT   Bridges Limitations small lifts better with scoot toward edge of bed.  10 X   Knee Flexion 10 reps   Knee Flexion Limitations 5 AA RT , 5 AROM from bolster, Lt 10 X  AROM   Other Supine Knee/Hip Exercises Clam, yellow band 10 reps,  ball squeeze 10 reps,                PT Education - 08/07/15 1715    Education provided Yes   Education Details bed transfer technique   Person(s) Educated Patient;Child(ren)   Methods Explanation;Demonstration;Tactile cues;Verbal cues   Comprehension Verbalized understanding;Returned demonstration;Verbal cues required;Tactile cues required;Need further instruction          PT Short Term Goals - 08/07/15 1721    PT SHORT TERM GOAL #1   Title pt will be I with basic HEP (08/11/2015)   Baseline needs cues   Time 4   Period Weeks   Status On-going   PT SHORT TERM GOAL #2   Title She will increase bil hip/knee strength to > 4-/5 to assist with walking and standing strength to promote (08/11/2015)   Time 4   Period Weeks   Status On-going   PT SHORT TERM GOAL #3   Title pt will be able to stand/walking for > 10 minutes with  LRAD for safety to increase walking/ standing endurance (08/11/2015)   Time 4   Status On-going   PT SHORT TERM GOAL #4   Title She will be able to verbalize and demonstrates techniques to protect the spine by log rolling/transitions, postural awarenss, proper DME use, and HEP (08/11/2015)   Time 4   Period Weeks   Status On-going           PT Long Term Goals - 07/29/15 1526    PT LONG TERM GOAL #1   Title upon discharge pt will be I with all HEP given throughout therapy (09/05/2015)   Time 8   Period Weeks   Status On-going   PT LONG TERM GOAL #2   Title pt will increase bil hip/knee strength to > 4/5 and ankle strenght to >4-/5 to help with safety during walking and prolonged standing with LRAD (09/05/2015)   Time 8   Period Weeks   Status On-going   PT LONG TERM GOAL #3   Title she will be able to perform walking/ standing for > 15 minutes with LRAD without stopping to rest to increase walking endurance and safety. (09/05/2015)   Time 8   Period Weeks   Status On-going   PT  LONG TERM GOAL #4   Title she will increase her 10 MWT by > 10 seconds to help progress towards community ambulation to help with personal goal of being able to go shopping (09/05/2015)   Baseline .13 M/s inital evaluation   Time 8   Period Weeks   Status On-going   PT LONG TERM GOAL #5   Title She will increase her FOTO score to 29 to indicate improved functional capacity upon discharge (09/05/2015)   Time 8   Period Weeks   Status On-going               Plan - 08/07/15 1716    Clinical Impression Statement Mat exercises were focused mainly because it took her quite a bit of effort with transfer training that mat exercises made more sense.  Able to transfer Supine to sit with cues only.  Minimal assist with sit to supine.  No new goals met.   PT Next Visit Plan Closed chain , sit to stand, weight shifts step ups in bars 2 inches?   Consulted and Agree with Plan of Care Patient;Family member/caregiver   Family Member Consulted daughter    Extra time required due to patient's age.    Problem List Patient Active Problem List   Diagnosis Date Noted  . Skin thinning 07/23/2015  . S/P lumbar spine operation 07/14/2015  . Deep vein thrombosis 06/30/2015  . Reduced mobility 06/19/2015  . Degenerative spondylolisthesis 06/17/2015  . SPL (spondylolisthesis) 06/12/2015  . CN (constipation) 06/04/2015  . Arteriosclerosis of coronary artery 06/04/2015  . Diastolic dysfunction with heart failure 06/04/2015  . History of digestive disease 06/04/2015  . HLD (hyperlipidemia) 06/04/2015  . BP (high blood pressure) 06/04/2015  . PONV (postoperative nausea and vomiting) 06/04/2015  . OP (osteoporosis) 06/04/2015  . Lumbar canal stenosis 04/07/2015  . CAD (coronary artery disease) 03/03/2012  . HTN (hypertension) 03/03/2012  . Hyperlipidemia 03/03/2012  . GERD (gastroesophageal reflux disease) 03/03/2012    [facility] 08/07/2015, 5:25 PM  [facility] [address] [place], [place], [address] Phone: [phone]   Fax:  [phone]

## 2015-08-11 ENCOUNTER — Ambulatory Visit: Payer: Medicare Other | Admitting: Physical Therapy

## 2015-08-11 DIAGNOSIS — R29898 Other symptoms and signs involving the musculoskeletal system: Secondary | ICD-10-CM

## 2015-08-11 DIAGNOSIS — Z9181 History of falling: Secondary | ICD-10-CM | POA: Diagnosis not present

## 2015-08-11 DIAGNOSIS — R2681 Unsteadiness on feet: Secondary | ICD-10-CM

## 2015-08-11 DIAGNOSIS — R293 Abnormal posture: Secondary | ICD-10-CM | POA: Diagnosis not present

## 2015-08-11 NOTE — Therapy (Signed)
[place]olly [name], [place], [address] Phone: [phone]   Fax:  [phone]  Physical Therapy Treatment  Patient Details  Name: Molly [name] MRN: [id number] Date of Birth: 02/16/1929 Referring Provider:  [name], MD  Encounter Date: 08/11/2015      PT End of Session - 08/11/15 1151    Visit Number 5   Number of Visits 16   Date for PT Re-Evaluation 09/05/15   Authorization Type Medicare KX modifier by the 15th visit, progress note on 8th or 9th visit.    PT Start Time 0930   PT Stop Time 1018   PT Time Calculation (min) 48 min   Activity Tolerance Patient tolerated treatment well   Behavior During Therapy WFL for tasks assessed/performed      Past Medical History  Diagnosis Date  . CAD (coronary artery disease)   . Spinal stenosis   . Acid reflux   . Arthritis   . Osteoporosis   . Hyperlipidemia   . High cholesterol   . Hypertension   . Anemia     Past Surgical History  Procedure Laterality Date  . Carotid stent    . Abdominal hysterectomy      There were no vitals filed for this visit.  Visit Diagnosis:  Weakness of both legs  Unsteadiness  Risk for falls  Abnormal posture      Subjective Assessment - 08/11/15 1026    Subjective "                         [facility] Adult PT Treatment/Exercise - 08/11/15 1028    Self-Care   Other Self-Care Comments  proper way to get up from a chair with nose over toes and rocking forward to stand up   Knee/Hip Exercises: Aerobic   Nustep L6 x 6 min   Knee/Hip Exercises: Standing   Knee Flexion AROM;Strengthening;Both;1 set;10 reps  3#   Other Standing Knee Exercises standing marching holding onto table touching knees to table for Tactile cueing  x 20   Other Standing Knee Exercises standing hip extension/abd 2 x 10    Knee/Hip Exercises: Seated   Long Arc Quad AROM;AAROM;Both;2 sets;15 reps   Long Arc Quad Weight 3  lbs.   Abduction/Adduction  AROM;AAROM;Strengthening;Both;1 set;15 reps   Sit to Sand 2 sets;5 reps  with table lowered between sets                PT Education - 08/11/15 1150    Education provided Yes   Education Details HEP review   Person(s) Educated Patient   Methods Explanation   Comprehension Verbalized understanding          PT Short Term Goals - 08/07/15 1721    PT SHORT TERM GOAL #1   Title pt will be I with basic HEP (08/11/2015)   Baseline needs cues   Time 4   Period Weeks   Status On-going   PT SHORT TERM GOAL #2   Title She will increase bil hip/knee strength to > 4-/5 to assist with walking and standing strength to promote (08/11/2015)   Time 4   Period Weeks   Status On-going   PT SHORT TERM GOAL #3   Title pt will be able to stand/walking for > 10 minutes with LRAD for safety to increase walking/ standing endurance (08/11/2015)   Time 4   Status On-going   PT SHORT TERM GOAL #4  Title She will be able to verbalize and demonstrates techniques to protect the spine by log rolling/transitions, postural awarenss, proper DME use, and HEP (08/11/2015)   Time 4   Period Weeks   Status On-going           PT Long Term Goals - 07/29/15 1526    PT LONG TERM GOAL #1   Title upon discharge pt will be I with all HEP given throughout therapy (09/05/2015)   Time 8   Period Weeks   Status On-going   PT LONG TERM GOAL #2   Title pt will increase bil hip/knee strength to > 4/5 and ankle strenght to >4-/5 to help with safety during walking and prolonged standing with LRAD (09/05/2015)   Time 8   Period Weeks   Status On-going   PT LONG TERM GOAL #3   Title she will be able to perform walking/ standing for > 15 minutes with LRAD without stopping to rest to increase walking endurance and safety. (09/05/2015)   Time 8   Period Weeks   Status On-going   PT LONG TERM GOAL #4   Title she will increase her 10 MWT by > 10 seconds to help progress towards community  ambulation to help with personal goal of being able to go shopping (09/05/2015)   Baseline .13 M/s inital evaluation   Time 8   Period Weeks   Status On-going   PT LONG TERM GOAL #5   Title She will increase her FOTO score to 29 to indicate improved functional capacity upon discharge (09/05/2015)   Time 8   Period Weeks   Status On-going               Plan - 08/11/15 1151    Clinical Impression Statement Molly [name] states that her legs are feeling more tired today but was able to complete all exercises requiring VC for proper form. Focused todays exercises to include more CKC strengthening which she reported mild fatigue but was able to finish all exercises. worked on sit to stand with increased focus on rocking and "nose over toes" concept which after a couple of reps she was able to perform well with minmimal assist with her hands.    PT Next Visit Plan Closed chain exercises, sit to stand, weight shifts step ups in bars 2 inches,    PT Home Exercise Plan HEP review        Problem List Patient Active Problem List   Diagnosis Date Noted  . Skin thinning 07/23/2015  . S/P lumbar spine operation 07/14/2015  . Deep vein thrombosis 06/30/2015  . Reduced mobility 06/19/2015  . Degenerative spondylolisthesis 06/17/2015  . SPL (spondylolisthesis) 06/12/2015  . CN (constipation) 06/04/2015  . Arteriosclerosis of coronary artery 06/04/2015  . Diastolic dysfunction with heart failure 06/04/2015  . History of digestive disease 06/04/2015  . HLD (hyperlipidemia) 06/04/2015  . BP (high blood pressure) 06/04/2015  . PONV (postoperative nausea and vomiting) 06/04/2015  . OP (osteoporosis) 06/04/2015  . Lumbar canal stenosis 04/07/2015  . CAD (coronary artery disease) 03/03/2012  . HTN (hypertension) 03/03/2012  . Hyperlipidemia 03/03/2012  . GERD (gastroesophageal reflux disease) 03/03/2012   Starr Lake PT, DPT, LAT, ATC  08/11/2015  1:06 PM      [facility] [address] [place], [place], [address] Phone: [phone]   Fax:  [phone]

## 2015-08-14 ENCOUNTER — Ambulatory Visit: Payer: Medicare Other | Attending: Surgical | Admitting: Physical Therapy

## 2015-08-14 DIAGNOSIS — R2681 Unsteadiness on feet: Secondary | ICD-10-CM

## 2015-08-14 DIAGNOSIS — R293 Abnormal posture: Secondary | ICD-10-CM | POA: Diagnosis not present

## 2015-08-14 DIAGNOSIS — Z9181 History of falling: Secondary | ICD-10-CM | POA: Diagnosis not present

## 2015-08-14 DIAGNOSIS — R29898 Other symptoms and signs involving the musculoskeletal system: Secondary | ICD-10-CM | POA: Diagnosis not present

## 2015-08-14 NOTE — Therapy (Signed)
[place], [place], [address] Phone: [phone]   Fax:  [phone]  Physical Therapy Treatment  Patient Details  Name: Molly [name] MRN: [id number] Date of Birth: 27-Sep-1929 Referring Provider:  [name], MD  Encounter Date: 08/14/2015      PT End of Session - 08/14/15 1111    Visit Number 6   Number of Visits 16   Date for PT Re-Evaluation 09/05/15   PT Start Time 1020   PT Stop Time 1058   PT Time Calculation (min) 38 min   Activity Tolerance Patient tolerated treatment well   Behavior During Therapy [facility] for tasks assessed/performed      Past Medical History  Diagnosis Date  . CAD (coronary artery disease)   . Spinal stenosis   . Acid reflux   . Arthritis   . Osteoporosis   . Hyperlipidemia   . High cholesterol   . Hypertension   . Anemia     Past Surgical History  Procedure Laterality Date  . Carotid stent    . Abdominal hysterectomy      There were no vitals filed for this visit.  Visit Diagnosis:  Weakness of both legs  Unsteadiness  Risk for falls  Abnormal posture      Subjective Assessment - 08/14/15 1104    Subjective Doing OK.  A little tired.  Daugther says sometimes she just is able to pop up uot of her chair.     Currently in Pain? No/denies                         [facility] Adult PT Treatment/Exercise - 08/14/15 1018    Transfers   Five time sit to stand comments  5 reps cues, encouragement min assist initially.   Ambulation/Gait   Assistive device --  rollater   Stairs Yes   Pre-Gait Activities mobility  Hip walking on mat forward and reverse without hands,  needed hands for side to side mobility    Gait Comments belt uses CGA, min assist lifting leg.  4, 6 inch steps   Knee/Hip Exercises: Aerobic   Nustep L6 5.5 minutes.  Seat 8                  PT Short Term Goals - 08/14/15 1115    PT SHORT TERM GOAL #1   Title  pt will be I with basic HEP (08/11/2015)   Baseline needs cues   Time 4   Period Weeks   Status On-going   PT SHORT TERM GOAL #2   Title She will increase bil hip/knee strength to > 4-/5 to assist with walking and standing strength to promote (08/11/2015)   Time 4   Period Weeks   Status On-going   PT SHORT TERM GOAL #3   Title pt will be able to stand/walking for > 10 minutes with LRAD for safety to increase walking/ standing endurance (08/11/2015)   Baseline 5 minutes in clinic prior to rest   Time 4   Status On-going   PT SHORT TERM GOAL #4   Time 4   Period Weeks   Status On-going   PT SHORT TERM GOAL #5   Time 4   Period Weeks   Status Unable to assess           PT Long Term Goals - 07/29/15 1526    PT LONG TERM GOAL #1   Title upon discharge  pt will be I with all HEP given throughout therapy (09/05/2015)   Time 8   Period Weeks   Status On-going   PT LONG TERM GOAL #2   Title pt will increase bil hip/knee strength to > 4/5 and ankle strenght to >4-/5 to help with safety during walking and prolonged standing with LRAD (09/05/2015)   Time 8   Period Weeks   Status On-going   PT LONG TERM GOAL #3   Title she will be able to perform walking/ standing for > 15 minutes with LRAD without stopping to rest to increase walking endurance and safety. (09/05/2015)   Time 8   Period Weeks   Status On-going   PT LONG TERM GOAL #4   Title she will increase her 10 MWT by > 10 seconds to help progress towards community ambulation to help with personal goal of being able to go shopping (09/05/2015)   Baseline .13 M/s inital evaluation   Time 8   Period Weeks   Status On-going   PT LONG TERM GOAL #5   Title She will increase her FOTO score to 29 to indicate improved functional capacity upon discharge (09/05/2015)   Time 8   Period Weeks   Status On-going               Plan - 08/14/15 1113    Clinical Impression Statement transfers require less assistance.  Steps and pre  gait exercises were focus.    PT Next Visit Plan Closed chain exercises, sit to stand,    Consulted and Agree with Plan of Care Patient        Problem List Patient Active Problem List   Diagnosis Date Noted  . Skin thinning 07/23/2015  . S/P lumbar spine operation 07/14/2015  . Deep vein thrombosis 06/30/2015  . Reduced mobility 06/19/2015  . Degenerative spondylolisthesis 06/17/2015  . SPL (spondylolisthesis) 06/12/2015  . CN (constipation) 06/04/2015  . Arteriosclerosis of coronary artery 06/04/2015  . Diastolic dysfunction with heart failure 06/04/2015  . History of digestive disease 06/04/2015  . HLD (hyperlipidemia) 06/04/2015  . BP (high blood pressure) 06/04/2015  . PONV (postoperative nausea and vomiting) 06/04/2015  . OP (osteoporosis) 06/04/2015  . Lumbar canal stenosis 04/07/2015  . CAD (coronary artery disease) 03/03/2012  . HTN (hypertension) 03/03/2012  . Hyperlipidemia 03/03/2012  . GERD (gastroesophageal reflux disease) 03/03/2012    [facility] 08/14/2015, 11:17 AM  [facility] [address] [place], [place], [address] Phone: [phone]   Fax:  [phone]     Melvenia Needles, PTA 08/14/2015 11:17 AM Phone: [phone] Fax: [phone]

## 2015-08-15 ENCOUNTER — Ambulatory Visit (INDEPENDENT_AMBULATORY_CARE_PROVIDER_SITE_OTHER): Payer: Medicare Other | Admitting: Interventional Cardiology

## 2015-08-15 ENCOUNTER — Encounter: Payer: Self-pay | Admitting: Interventional Cardiology

## 2015-08-15 VITALS — BP 118/62 | HR 66 | Ht 62.0 in | Wt 159.2 lb

## 2015-08-15 DIAGNOSIS — I2510 Atherosclerotic heart disease of native coronary artery without angina pectoris: Secondary | ICD-10-CM

## 2015-08-15 DIAGNOSIS — I5032 Chronic diastolic (congestive) heart failure: Secondary | ICD-10-CM

## 2015-08-15 DIAGNOSIS — R600 Localized edema: Secondary | ICD-10-CM

## 2015-08-15 NOTE — Patient Instructions (Signed)
Medication Instructions:  None  Labwork: None  Testing/Procedures: None  Follow-Up: Your physician recommends that you schedule a follow-up appointment as needed.  Any Other Special Instructions Will Be Listed Below (If Applicable).

## 2015-08-15 NOTE — Progress Notes (Signed)
Patient ID: Molly [name], female   DOB: 07-Feb-1929, 81 y.o.   MRN: [id number]     Cardiology Office Note   Date:  08/15/2015   ID:  Molly [name], DOB [birth year], MRN [id number]  PCP:  Osborne Casco, MD    No chief complaint on file.    Wt Readings from Last 3 Encounters:  08/15/15 159 lb 3.2 oz (72.213 kg)  05/16/15 156 lb 12.8 oz (71.124 kg)  05/15/14 146 lb 12.8 oz (66.588 kg)       History of Present Illness: Molly [name] is a 81 y.o. female  who has had CAD, prior BMS to RCA. She had a mild abnormality on her stress test in 2012. She has been managed medically.  Prior to her stent, she had a chest pain that would not resolve.    She had back surgery a few months ago.  It was very complicated.  She had a DVT and then was treated with heparin. There was apparent postoperative bleeding. She subsequent had a IVC filter placed. She is not a candidate for anticoagulation at this time. She did recover fairly well after the second surgery. She had some right foot drop postoperatively. Her pain is much better. During all this, she had no chest discomfort or any cardiac issues.  Her daughter decreased her home dose of Lasix. The patient is tolerated this well. Her blood pressure has been stable.    Past Medical History  Diagnosis Date  . CAD (coronary artery disease)   . Spinal stenosis   . Acid reflux   . Arthritis   . Osteoporosis   . Hyperlipidemia   . High cholesterol   . Hypertension   . Anemia     Past Surgical History  Procedure Laterality Date  . Carotid stent    . Abdominal hysterectomy       Current Outpatient Prescriptions  Medication Sig Dispense Refill  . acetaminophen (TYLENOL) 500 MG tablet Take 500 mg by mouth daily.     Marland Kitchen aspirin EC 81 MG tablet Take 81 mg by mouth every morning.    . denosumab (PROLIA) 60 MG/ML SOLN injection Inject 60 mg into the skin every 6 (six) months. Administer in upper arm, thigh, or abdomen    .  docusate sodium (COLACE) 100 MG capsule Take by mouth 3 (three) times daily.     Marland Kitchen esomeprazole (NEXIUM) 40 MG capsule Take 40 mg by mouth daily. Takes 1 time in the morning, and take another at night if needed.    . furosemide (LASIX) 20 MG tablet Take 20 mg by mouth daily.    Marland Kitchen gabapentin (NEURONTIN) 100 MG capsule Take 100 mg by mouth daily.     . Glucosamine-Chondroitin (OSTEO BI-FLEX REGULAR STRENGTH PO) Take 1 capsule by mouth 2 (two) times daily.    . isosorbide mononitrate (IMDUR) 60 MG 24 hr tablet TAKE 1 TABLET DAILY 90 tablet 2  . metoprolol succinate (TOPROL-XL) 25 MG 24 hr tablet TAKE 1 TABLET DAILY 90 tablet 1  . Misc Natural Products (NARCOSOFT HERBAL LAX PO) Take by mouth.    . nitroGLYCERIN (NITROSTAT) 0.4 MG SL tablet Place 0.4 mg under the tongue every 5 (five) minutes as needed for chest pain.    Marland Kitchen omega-3 acid ethyl esters (LOVAZA) 1 G capsule TAKE 1 CAPSULE TWICE A DAY 180 capsule 0  . OVER THE COUNTER MEDICATION Take 2 tablets by mouth 2 (two) times daily. Herb-Lax    . polysaccharide iron (NIFEREX) 150  MG CAPS capsule Take 150 mg by mouth every morning.    . potassium chloride (K-DUR,KLOR-CON) 10 MEQ tablet Take 10 mEq by mouth daily.    . potassium chloride SA (KLOR-CON M20) 20 MEQ tablet Take 1 tablet (20 mEq total) by mouth daily. 90 tablet 3  . Probiotic Product (ALIGN) 4 MG CAPS Take 1 tablet by mouth every morning.    . ranolazine (RANEXA) 500 MG 12 hr tablet Take 1 tablet (500 mg total) by mouth 2 (two) times daily. 180 tablet 3  . rosuvastatin (CRESTOR) 20 MG tablet Take 20 mg by mouth at bedtime.    . Turmeric 500 MG CAPS Take by mouth 4 (four) times daily.    . vitamin C (ASCORBIC ACID) 250 MG tablet Take 250 mg by mouth daily.     No current facility-administered medications for this visit.    Allergies:   Buprenorphine hcl; Codeine; Hydrocodone; Morphine and related; Sulfa antibiotics; Sulfacetamide sodium; Evista; Hydromorphone; Sulfamethoxazole;  Sulfamethoxazole-trimethoprim; and Morphine    Social History:  The patient  reports that she has never smoked. She has never used smokeless tobacco. She reports that she does not drink alcohol or use illicit drugs.   Family History:  The patient's family history includes Anemia in her mother; Heart attack in her brother.    ROS:  Please see the history of present illness.   Otherwise, review of systems are positive for leg edema.   All other systems are reviewed and negative.    PHYSICAL EXAM: VS:  BP 118/62 mmHg  Pulse 66  Ht 5\' 2"  (1.575 m)  Wt 159 lb 3.2 oz (72.213 kg)  BMI 29.11 kg/m2  SpO2 97% , BMI Body mass index is 29.11 kg/(m^2). GEN: Well nourished, well developed, in no acute distress HEENT: normal Neck: no JVD, carotid bruits, or masses Cardiac: RRR; no murmurs, rubs, or gallops, bilateral lower extremity edema  Respiratory:  clear to auscultation bilaterally, normal work of breathing GI: soft, nontender, nondistended, + BS MS: no deformity or atrophy Skin: warm and dry, no rash Neuro:  Strength and sensation are intact Psych: euthymic mood, flat affect     Recent Labs: 11/18/2014: ALT 19 05/16/2015: Pro B Natriuretic peptide (BNP) 150.0* 05/28/2015: BUN 16; Creat 1.08; Potassium 3.7; Sodium 140   Lipid Panel    Component Value Date/Time   CHOL 203* 11/18/2014 1002   TRIG 127.0 11/18/2014 1002   HDL 91.00 11/18/2014 1002   CHOLHDL 2 11/18/2014 1002   VLDL 25.4 11/18/2014 1002   LDLCALC 87 11/18/2014 1002     Other studies Reviewed: Additional studies/ records that were reviewed today with results demonstrating: .   ASSESSMENT AND PLAN:  1. Chronic diastolic heart failure: Appears euvolemic. Continue lower dose of furosemide. If she retains fluid, could take an extra 20 mg of Lasix.  2. Coronary artery disease: No angina. Continue current medications. 3. *Edema: Elevate legs. Will continue to wear stockings.   Current medicines are reviewed at length  with the patient today.  The patient concerns regarding her medicines were addressed.  The following changes have been made:  No change  Labs/ tests ordered today include:  No orders of the defined types were placed in this encounter.    Recommend 150 minutes/week of aerobic exercise Low fat, low carb, high fiber diet recommended  Disposition:   FU in when necessary at this point as she has multiple appointments with other doctors.   Teresita Madura., MD  08/15/2015 11:58  AM    [facility] [place], [place], [place]  [address] Phone: [phone]; Fax: [phone]

## 2015-08-19 ENCOUNTER — Ambulatory Visit: Payer: Medicare Other | Admitting: Physical Therapy

## 2015-08-19 DIAGNOSIS — R2681 Unsteadiness on feet: Secondary | ICD-10-CM

## 2015-08-19 DIAGNOSIS — Z9181 History of falling: Secondary | ICD-10-CM | POA: Diagnosis not present

## 2015-08-19 DIAGNOSIS — R29898 Other symptoms and signs involving the musculoskeletal system: Secondary | ICD-10-CM | POA: Diagnosis not present

## 2015-08-19 DIAGNOSIS — R293 Abnormal posture: Secondary | ICD-10-CM | POA: Diagnosis not present

## 2015-08-19 NOTE — Therapy (Signed)
[place], [place], [address] Phone: [phone]   Fax:  [phone]  Physical Therapy Treatment  Patient Details  Name: Rosia Syme MRN: [id number] Date of Birth: October 01, 1929 Referring Provider:  [name], MD  Encounter Date: 08/19/2015      PT End of Session - 08/19/15 1818    Visit Number 7   Number of Visits 16   Date for PT Re-Evaluation 09/05/15   PT Start Time 6378   PT Stop Time 1500   PT Time Calculation (min) 43 min   Activity Tolerance Patient tolerated treatment well;Patient limited by fatigue   Behavior During Therapy [facility] for tasks assessed/performed      Past Medical History  Diagnosis Date  . CAD (coronary artery disease)   . Spinal stenosis   . Acid reflux   . Arthritis   . Osteoporosis   . Hyperlipidemia   . High cholesterol   . Hypertension   . Anemia     Past Surgical History  Procedure Laterality Date  . Carotid stent    . Abdominal hysterectomy      There were no vitals filed for this visit.  Visit Diagnosis:  Weakness of both legs  Unsteadiness  Risk for falls  Abnormal posture      Subjective Assessment - 08/19/15 1433    Subjective OK.  Sore after the last session.  Gluteals .Able to walk 2 blocks. yesterday.                         [facility] Adult PT Treatment/Exercise - 08/19/15 1430    High Level Balance   High Level Balance Comments box step 4 reps each direction, CGA for safety.     Knee/Hip Exercises: Aerobic   Nustep L4, 6 minutes,    314   Knee/Hip Exercises: Standing   Hip Flexion --  walking cues posture 40 feet CGS, Belt   Knee/Hip Exercises: Seated   Marching Limitations hip walking forward reverse, side to side.  Better on bare mat.  less use of hands.   Sit to Sand 5 reps  back posture much improved with this activity      Extra time required due to patient's age.          PT Education - 08/19/15  1820    Education provided Yes   Education Details home walking program   Person(s) Educated Patient;Child(ren)   Methods Explanation;Handout   Comprehension Verbalized understanding          PT Short Term Goals - 08/19/15 1821    PT SHORT TERM GOAL #1   Title pt will be I with basic HEP (08/11/2015)   Baseline needs cues   Time 4   Period Weeks   Status On-going   PT SHORT TERM GOAL #2   Title She will increase bil hip/knee strength to > 4-/5 to assist with walking and standing strength to promote (08/11/2015)   Time 4   Period Weeks   Status On-going   PT SHORT TERM GOAL #3   Title pt will be able to stand/walking for > 10 minutes with LRAD for safety to increase walking/ standing endurance (08/11/2015)   Baseline can now walk 2 blocks.  Not sure how long it took.   Time 4   Period Weeks   Status On-going   PT SHORT TERM GOAL #4   Title She will be able to verbalize and demonstrates  techniques to protect the spine by log rolling/transitions, postural awarenss, proper DME use, and HEP (08/11/2015)   Baseline needed reminding of hand placement in clinic 1 X today   Time 4   Period Weeks   Status On-going   PT SHORT TERM GOAL #5   Title She will increase her FOTO score by > 5 points to help with functional progression (08/11/2015)   Time 4   Period Weeks   Status Unable to assess           PT Long Term Goals - 08/19/15 1822    PT LONG TERM GOAL #1   Title upon discharge pt will be I with all HEP given throughout therapy (09/05/2015)   Time 8   Period Weeks   Status On-going   PT LONG TERM GOAL #2   Title pt will increase bil hip/knee strength to > 4/5 and ankle strenght to >4-/5 to help with safety during walking and prolonged standing with LRAD (09/05/2015)   Time 8   Period Weeks   Status On-going   PT LONG TERM GOAL #3   Title she will be able to perform walking/ standing for > 15 minutes with LRAD without stopping to rest to increase walking endurance and safety.  (09/05/2015)   Time 8   Period Weeks   Status On-going   PT LONG TERM GOAL #4   Title she will increase her 10 MWT by > 10 seconds to help progress towards community ambulation to help with personal goal of being able to go shopping (09/05/2015)   Time 8   Period Weeks   Status Unable to assess   PT LONG TERM GOAL #5   Time 8   Period Weeks   Status Unable to assess               Plan - 08/19/15 1818    Clinical Impression Statement sit to stand is consistantly improved.  Daughter noted posture with standing/walking improved.  NO pain.    PT Next Visit Plan work toward goals.        Problem List Patient Active Problem List   Diagnosis Date Noted  . Bilateral lower extremity edema 08/15/2015  . Skin thinning 07/23/2015  . S/P lumbar spine operation 07/14/2015  . Deep vein thrombosis 06/30/2015  . Reduced mobility 06/19/2015  . Degenerative spondylolisthesis 06/17/2015  . SPL (spondylolisthesis) 06/12/2015  . CN (constipation) 06/04/2015  . Arteriosclerosis of coronary artery 06/04/2015  . Diastolic dysfunction with heart failure 06/04/2015  . History of digestive disease 06/04/2015  . HLD (hyperlipidemia) 06/04/2015  . BP (high blood pressure) 06/04/2015  . PONV (postoperative nausea and vomiting) 06/04/2015  . OP (osteoporosis) 06/04/2015  . Lumbar canal stenosis 04/07/2015  . CAD (coronary artery disease) 03/03/2012  . HTN (hypertension) 03/03/2012  . Hyperlipidemia 03/03/2012  . GERD (gastroesophageal reflux disease) 03/03/2012    [facility] 08/19/2015, 6:24 PM  [facility] [address] [place], [place], [address] Phone: [phone]   Fax:  [phone]     Melvenia Needles, PTA 08/19/2015 6:24 PM Phone: [phone] Fax: [phone]

## 2015-08-19 NOTE — Patient Instructions (Signed)
Home walking program. With 1 person to assist.  Gait belt and walker.

## 2015-08-21 ENCOUNTER — Ambulatory Visit: Payer: Medicare Other | Admitting: Physical Therapy

## 2015-08-21 DIAGNOSIS — R293 Abnormal posture: Secondary | ICD-10-CM

## 2015-08-21 DIAGNOSIS — R29898 Other symptoms and signs involving the musculoskeletal system: Secondary | ICD-10-CM | POA: Diagnosis not present

## 2015-08-21 DIAGNOSIS — R2681 Unsteadiness on feet: Secondary | ICD-10-CM | POA: Diagnosis not present

## 2015-08-21 DIAGNOSIS — Z9181 History of falling: Secondary | ICD-10-CM

## 2015-08-21 NOTE — Therapy (Addendum)
[place], [place], [address] Phone: [phone]   Fax:  [phone]  Physical Therapy Treatment   Patient Details  Name: Molly [name] MRN: [id number] Date of Birth: 03-03-1929 Referring Provider:  [name], MD  Encounter Date: 08/21/2015      PT End of Session - 08/21/15 1125    Visit Number 8   Number of Visits 16   Date for PT Re-Evaluation 09/05/15   PT Start Time 0845   PT Stop Time 0930   PT Time Calculation (min) 45 min   Activity Tolerance Patient tolerated treatment well   Behavior During Therapy [facility] for tasks assessed/performed      Past Medical History  Diagnosis Date  . CAD (coronary artery disease)   . Spinal stenosis   . Acid reflux   . Arthritis   . Osteoporosis   . Hyperlipidemia   . High cholesterol   . Hypertension   . Anemia     Past Surgical History  Procedure Laterality Date  . Carotid stent    . Abdominal hysterectomy      There were no vitals filed for this visit.  Visit Diagnosis:  Weakness of both legs  Unsteadiness  Risk for falls  Abnormal posture      Subjective Assessment - 08/21/15 0851    Subjective "I feel like I am doing better" pt reports at home she has been working on moving her foot to get  better mobility"    Currently in Pain? No/denies   Pain Score 0-No pain            [facility] PT Assessment - 08/21/15 0905    6 Minute Walk- Baseline   6 Minute Walk- Baseline yes   Perceived Rate of Exertion (Borg) 12-   6 minute walk test results    Aerobic Endurance Distance Walked 496  with rollator   Endurance additional comments pt stopped walking for once for 20 sec and remained standing                     [facility] Adult PT Treatment/Exercise - 08/21/15 0906    Knee/Hip Exercises: Aerobic   Nustep L5 x 5 min  using both UE/LE   Knee/Hip Exercises: Standing   Other Standing Knee Exercises standing hip extension/abd  2 x 10   with HHA +1, with 3# weight   Knee/Hip Exercises: Seated   Long Arc Quad Strengthening;Both;2 sets;15 reps   Long Arc Quad Weight 3 lbs.   Marching Strengthening;Both;AROM;2 sets;20 reps;Weights   Marching Limitations 3   Sit to Sand without UE support;2 sets;5 reps  min assist +1   Ankle Exercises: Seated   Other Seated Ankle Exercises ankle eversion, inversion, PF/DF x 15 each  with manual resistance                  PT Short Term Goals - 08/19/15 1821    PT SHORT TERM GOAL #1   Title pt will be I with basic HEP (08/11/2015)   Baseline needs cues   Time 4   Period Weeks   Status On-going   PT SHORT TERM GOAL #2   Title She will increase bil hip/knee strength to > 4-/5 to assist with walking and standing strength to promote (08/11/2015)   Time 4   Period Weeks   Status On-going   PT SHORT TERM GOAL #3   Title pt will be able to stand/walking for >  10 minutes with LRAD for safety to increase walking/ standing endurance (08/11/2015)   Baseline can now walk 2 blocks.  Not sure how long it took.   Time 4   Period Weeks   Status On-going   PT SHORT TERM GOAL #4   Title She will be able to verbalize and demonstrates techniques to protect the spine by log rolling/transitions, postural awarenss, proper DME use, and HEP (08/11/2015)   Baseline needed reminding of hand placement in clinic 1 X today   Time 4   Period Weeks   Status On-going   PT SHORT TERM GOAL #5   Title She will increase her FOTO score by > 5 points to help with functional progression (08/11/2015)   Time 4   Period Weeks   Status Unable to assess           PT Long Term Goals - 08/19/15 1822    PT LONG TERM GOAL #1   Title upon discharge pt will be I with all HEP given throughout therapy (09/05/2015)   Time 8   Period Weeks   Status On-going   PT LONG TERM GOAL #2   Title pt will increase bil hip/knee strength to > 4/5 and ankle strenght to >4-/5 to help with safety during walking and  prolonged standing with LRAD (09/05/2015)   Time 8   Period Weeks   Status On-going   PT LONG TERM GOAL #3   Title she will be able to perform walking/ standing for > 15 minutes with LRAD without stopping to rest to increase walking endurance and safety. (09/05/2015)   Time 8   Period Weeks   Status On-going   PT LONG TERM GOAL #4   Title she will increase her 10 MWT by > 10 seconds to help progress towards community ambulation to help with personal goal of being able to go shopping (09/05/2015)   Time 8   Period Weeks   Status Unable to assess   PT LONG TERM GOAL #5   Time 8   Period Weeks   Status Unable to assess               Plan - 08/21/15 1126    Clinical Impression Statement Molly [name] continues to make progress with increased standing and walking tolerance as well as sit to stand from lower surfaces. performed 6 min walk test and she was able to walk 496 ft having to stop once without sitting down. Plan to coninute progress with strength and work toward goals.    PT Next Visit Plan hip/knee strengthening/ walking, stair training, continue toward goals, Progress note        Problem List Patient Active Problem List   Diagnosis Date Noted  . Bilateral lower extremity edema 08/15/2015  . Skin thinning 07/23/2015  . S/P lumbar spine operation 07/14/2015  . Deep vein thrombosis 06/30/2015  . Reduced mobility 06/19/2015  . Degenerative spondylolisthesis 06/17/2015  . SPL (spondylolisthesis) 06/12/2015  . CN (constipation) 06/04/2015  . Arteriosclerosis of coronary artery 06/04/2015  . Diastolic dysfunction with heart failure 06/04/2015  . History of digestive disease 06/04/2015  . HLD (hyperlipidemia) 06/04/2015  . BP (high blood pressure) 06/04/2015  . PONV (postoperative nausea and vomiting) 06/04/2015  . OP (osteoporosis) 06/04/2015  . Lumbar canal stenosis 04/07/2015  . CAD (coronary artery disease) 03/03/2012  . HTN (hypertension) 03/03/2012  .  Hyperlipidemia 03/03/2012  . GERD (gastroesophageal reflux disease) 03/03/2012   Starr Lake PT, DPT, LAT, ATC  08/21/2015  1:28 PM      [place], [place], [address] Phone: [phone]   Fax:  [phone]

## 2015-08-27 ENCOUNTER — Ambulatory Visit: Payer: Medicare Other | Admitting: Physical Therapy

## 2015-08-27 DIAGNOSIS — R293 Abnormal posture: Secondary | ICD-10-CM | POA: Diagnosis not present

## 2015-08-27 DIAGNOSIS — R2681 Unsteadiness on feet: Secondary | ICD-10-CM | POA: Diagnosis not present

## 2015-08-27 DIAGNOSIS — Z9181 History of falling: Secondary | ICD-10-CM

## 2015-08-27 DIAGNOSIS — R29898 Other symptoms and signs involving the musculoskeletal system: Secondary | ICD-10-CM | POA: Diagnosis not present

## 2015-08-27 NOTE — Therapy (Addendum)
[place], [place], [address] Phone: [phone]   Fax:  [phone]  Physical Therapy Treatment  Patient Details  Name: Molly [name] MRN: [id number] Date of Birth: [birth year] Referring Provider:  [name], MD  Encounter Date: 08/27/2015      PT End of Session - 08/27/15 1152    Visit Number 9   Number of Visits 16   Date for PT Re-Evaluation 09/05/15   Authorization Type Medicare KX modifier by the 15th visit, progress note on 8th or 9th visit.    PT Start Time 1015   PT Stop Time 1100   PT Time Calculation (min) 45 min   Activity Tolerance Patient tolerated treatment well   Behavior During Therapy WFL for tasks assessed/performed      Past Medical History  Diagnosis Date  . CAD (coronary artery disease)   . Spinal stenosis   . Acid reflux   . Arthritis   . Osteoporosis   . Hyperlipidemia   . High cholesterol   . Hypertension   . Anemia     Past Surgical History  Procedure Laterality Date  . Carotid stent    . Abdominal hysterectomy      There were no vitals filed for this visit.  Visit Diagnosis:  Weakness of both legs  Unsteadiness  Risk for falls  Abnormal posture      Subjective Assessment - 08/27/15 1024    Subjective "pt reports that she wasn't as compliant with her exercises this last week as she has been"   Currently in Pain? No/denies   Pain Location Leg   Pain Orientation Right;Left            [facility] PT Assessment - 08/27/15 0001    Strength   Right Hip Flexion 4-/5   Right Hip Extension 3+/5   Right Hip ABduction 4-/5   Right Hip ADduction 4-/5   Left Hip Flexion 4-/5   Left Hip Extension 3+/5   Left Hip ABduction 4-/5   Left Hip ADduction 4-/5   Right Knee Flexion 4-/5   Right Knee Extension 4-/5   Left Knee Flexion 4/5   Left Knee Extension 4/5   Right Ankle Dorsiflexion 3-/5   Right Ankle Plantar Flexion 3+/5   Right Ankle Inversion  3-/5   Right Ankle Eversion 3-/5   Left Ankle Dorsiflexion 4-/5   Left Ankle Plantar Flexion 4-/5   Left Ankle Inversion 3/5   Left Ankle Eversion 3/5                     [facility] Adult PT Treatment/Exercise - 08/27/15 1039    Knee/Hip Exercises: Aerobic   Nustep L6 x x 5 min, L8 x 5 min   Knee/Hip Exercises: Seated   Sit to Sand 2 sets  x 6 reps   Knee/Hip Exercises: Supine   Bridges Limitations 1 x 15   Bridges with Clamshell Both;Strengthening;1 set;15 reps  with red theraband   Straight Leg Raises AROM;Strengthening;Both;1 set;15 reps   Other Supine Knee/Hip Exercises marching with red theraband around x 20 each   Other Supine Knee/Hip Exercises clam shells 1 x 15  with red theraband   Ankle Exercises: Seated   Other Seated Ankle Exercises DF/PF x 20                PT Education - 08/27/15 1152    Education provided Yes   Education Details updated HEP   Person(s) Educated  Patient   Methods Explanation   Comprehension Verbalized understanding          PT Short Term Goals - 08/27/15 1155    PT SHORT TERM GOAL #1   Title pt will be I with basic HEP (08/11/2015)   Baseline needs cues   Time 4   Period Weeks   Status Achieved   PT SHORT TERM GOAL #2   Title She will increase bil hip/knee strength to > 4-/5 to assist with walking and standing strength to promote (08/11/2015)   Time 4   Period Weeks   Status Achieved   PT SHORT TERM GOAL #3   Title pt will be able to stand/walking for > 10 minutes with LRAD for safety to increase walking/ standing endurance (08/11/2015)   Baseline can now walk 2 blocks.  Not sure how long it took.   Time 4   Period Weeks   Status Achieved   PT SHORT TERM GOAL #4   Title She will be able to verbalize and demonstrates techniques to protect the spine by log rolling/transitions, postural awarenss, proper DME use, and HEP (08/11/2015)   Baseline needed reminding of hand placement in clinic 1 X today   Time 4   Period  Weeks   Status Partially Met   PT SHORT TERM GOAL #5   Title She will increase her FOTO score by > 5 points to help with functional progression (08/11/2015)   Time 4   Period Weeks   Status Unable to assess           PT Long Term Goals - 08/27/15 1157    PT LONG TERM GOAL #1   Title upon discharge pt will be I with all HEP given throughout therapy (09/05/2015)   Time 8   Period Weeks   Status On-going   PT LONG TERM GOAL #2   Title pt will increase bil hip/knee strength to > 4/5 and ankle strenght to >4-/5 to help with safety during walking and prolonged standing with LRAD (09/05/2015)   Time 8   Period Weeks   Status On-going   PT LONG TERM GOAL #3   Title she will be able to perform walking/ standing for > 15 minutes with LRAD without stopping to rest to increase walking endurance and safety. (09/05/2015)   Time 8   Period Weeks   Status On-going   PT LONG TERM GOAL #4   Title she will increase her 10 MWT by > 10 seconds to help progress towards community ambulation to help with personal goal of being able to go shopping (09/05/2015)   Baseline .13 M/s inital evaluation   Time 8   Period Weeks   Status On-going   PT LONG TERM GOAL #5   Title She will increase her FOTO score to 29 to indicate improved functional capacity upon discharge (09/05/2015)   Time 8   Period Weeks   Status Unable to assess               Plan - 08/27/15 1153    Clinical Impression Statement [name] was able to perform all exercises well today and continues to demonstrate progress with strengthening and was able to perform SLR in supine. Plan to progress with strengthing as tolerated, to progress toward goals and improve endurance and strength.    PT Next Visit Plan hip/knee strengthening/ walking, stair training, continue toward goals, FOTO   PT Home Exercise Plan SLR  G-Codes - 08/27/15 1245    Functional Assessment Tool Used Clinical Judgement   Functional Limitation  Mobility: Walking and moving around   Mobility: Walking and Moving Around Current Status [id number]) At least 80 percent but less than 100 percent impaired, limited or restricted   Mobility: Walking and Moving Around Goal Status [phone]) At least 60 percent but less than 80 percent impaired, limited or restricted      Problem List Patient Active Problem List   Diagnosis Date Noted  . Bilateral lower extremity edema 08/15/2015  . Skin thinning 07/23/2015  . S/P lumbar spine operation 07/14/2015  . Deep vein thrombosis 06/30/2015  . Reduced mobility 06/19/2015  . Degenerative spondylolisthesis 06/17/2015  . SPL (spondylolisthesis) 06/12/2015  . CN (constipation) 06/04/2015  . Arteriosclerosis of coronary artery 06/04/2015  . Diastolic dysfunction with heart failure 06/04/2015  . History of digestive disease 06/04/2015  . HLD (hyperlipidemia) 06/04/2015  . BP (high blood pressure) 06/04/2015  . PONV (postoperative nausea and vomiting) 06/04/2015  . OP (osteoporosis) 06/04/2015  . Lumbar canal stenosis 04/07/2015  . CAD (coronary artery disease) 03/03/2012  . HTN (hypertension) 03/03/2012  . Hyperlipidemia 03/03/2012  . GERD (gastroesophageal reflux disease) 03/03/2012   Starr Lake PT, DPT, LAT, ATC  08/27/2015  12:57 PM      [facility] [address] [place], [place], [address] Phone: [phone]   Fax:  [phone]      Physical Therapy Progress Note  Dates of Reporting Period: 07/11/2015 to 08/27/2015  Objective Reports of Subjective Statement: "I feel like I am getting a little stronger" pt's daughter report that she is doing better with walking/ standing.   Objective Measurements: see flow sheet.   Goal Update: met STG 1-3, and partially met 4.  Plan: continue with current POC  Reason Skilled Services are Required: Continued bil LE weakness, limited balance, high fall risk

## 2015-08-27 NOTE — Patient Instructions (Signed)
   Israella Hubert PT, DPT, LAT, ATC  [facility] Phone: [phone]     

## 2015-09-02 ENCOUNTER — Ambulatory Visit: Payer: Medicare Other | Admitting: Physical Therapy

## 2015-09-02 DIAGNOSIS — Z9181 History of falling: Secondary | ICD-10-CM | POA: Diagnosis not present

## 2015-09-02 DIAGNOSIS — R293 Abnormal posture: Secondary | ICD-10-CM | POA: Diagnosis not present

## 2015-09-02 DIAGNOSIS — R2681 Unsteadiness on feet: Secondary | ICD-10-CM

## 2015-09-02 DIAGNOSIS — R29898 Other symptoms and signs involving the musculoskeletal system: Secondary | ICD-10-CM | POA: Diagnosis not present

## 2015-09-02 NOTE — Therapy (Signed)
[place], [place], [address] Phone: [phone]   Fax:  [phone]  Physical Therapy Treatment  Patient Details  Name: Molly [name] MRN: [id number] Date of Birth: 03-26-1929 Referring Provider:  [name], MD  Encounter Date: 09/02/2015      PT End of Session - 09/02/15 1127    Visit Number 10   Number of Visits 16   Date for PT Re-Evaluation 09/05/15   Authorization Type Medicare KX modifier by the 15th visit, progress note on 8th or 9th visit.    PT Start Time 1015   PT Stop Time 1100   PT Time Calculation (min) 45 min   Activity Tolerance Patient tolerated treatment well   Behavior During Therapy WFL for tasks assessed/performed      Past Medical History  Diagnosis Date  . CAD (coronary artery disease)   . Spinal stenosis   . Acid reflux   . Arthritis   . Osteoporosis   . Hyperlipidemia   . High cholesterol   . Hypertension   . Anemia     Past Surgical History  Procedure Laterality Date  . Carotid stent    . Abdominal hysterectomy      There were no vitals filed for this visit.  Visit Diagnosis:  Weakness of both legs  Unsteadiness  Risk for falls  Abnormal posture      Subjective Assessment - 09/02/15 1015    Subjective pt reports that she wants to get stronger but feels like she is taking a while and that it is hard to not get discouraged   Currently in Pain? No/denies   Pain Score 0-No pain   Pain Location Leg   Pain Orientation Right;Left                         [facility] Adult PT Treatment/Exercise - 09/02/15 1024    Knee/Hip Exercises: Aerobic   Recumbent Bike L5 x 5 min, L 8 x 5 min   Knee/Hip Exercises: Standing   Knee Flexion AROM;Strengthening;Both;1 set;10 reps  4#   Forward Step Up Both;2 sets;5 reps;Hand Hold: 2;Other (comment);Step Height: 2"  in //    Gait Training side stepping 4 x 10 with 3#   in //   Other Standing Knee  Exercises standing SLR with CW/CCW with 3# 2 x 15 bil, marching 2 x 15 with 4#  in //   Other Standing Knee Exercises standing hip extension/abd 2 x 10, marching in place 2#,   in //   Knee/Hip Exercises: Seated   Long Arc Quad Strengthening;Both;2 sets;15 reps   Long Arc Quad Weight 4 lbs.   Sit to [employer] 2 sets;5 reps                PT Education - 09/02/15 1127    Education provided Yes   Education Details HEP review          PT Short Term Goals - 08/27/15 1155    PT SHORT TERM GOAL #1   Title pt will be I with basic HEP (08/11/2015)   Baseline needs cues   Time 4   Period Weeks   Status Achieved   PT SHORT TERM GOAL #2   Title She will increase bil hip/knee strength to > 4-/5 to assist with walking and standing strength to promote (08/11/2015)   Time 4   Period Weeks   Status Achieved   PT SHORT TERM GOAL #  3   Title pt will be able to stand/walking for > 10 minutes with LRAD for safety to increase walking/ standing endurance (08/11/2015)   Baseline can now walk 2 blocks.  Not sure how long it took.   Time 4   Period Weeks   Status Achieved   PT SHORT TERM GOAL #4   Title She will be able to verbalize and demonstrates techniques to protect the spine by log rolling/transitions, postural awarenss, proper DME use, and HEP (08/11/2015)   Baseline needed reminding of hand placement in clinic 1 X today   Time 4   Period Weeks   Status Partially Met   PT SHORT TERM GOAL #5   Title She will increase her FOTO score by > 5 points to help with functional progression (08/11/2015)   Time 4   Period Weeks   Status Unable to assess           PT Long Term Goals - 08/27/15 1157    PT LONG TERM GOAL #1   Title upon discharge pt will be I with all HEP given throughout therapy (09/05/2015)   Time 8   Period Weeks   Status On-going   PT LONG TERM GOAL #2   Title pt will increase bil hip/knee strength to > 4/5 and ankle strenght to >4-/5 to help with safety during walking and  prolonged standing with LRAD (09/05/2015)   Time 8   Period Weeks   Status On-going   PT LONG TERM GOAL #3   Title she will be able to perform walking/ standing for > 15 minutes with LRAD without stopping to rest to increase walking endurance and safety. (09/05/2015)   Time 8   Period Weeks   Status On-going   PT LONG TERM GOAL #4   Title she will increase her 10 MWT by > 10 seconds to help progress towards community ambulation to help with personal goal of being able to go shopping (09/05/2015)   Baseline .13 M/s inital evaluation   Time 8   Period Weeks   Status On-going   PT LONG TERM GOAL #5   Title She will increase her FOTO score to 29 to indicate improved functional capacity upon discharge (09/05/2015)   Time 8   Period Weeks   Status Unable to assess               Plan - 09/02/15 1128    Clinical Impression Statement Molly [name] reports that she feels discouraged that she isn't where she wants to be in regards to strength and endurance. She reports she does her HEP and that her she wants to work on getting her legs into bed. Focused todays treatment on improving balance and standing endurance with strengthening. she was able to complete all exercises with only complaint being tired. Plan to progress as tolerated with [facility] strengthening and balance training   PT Next Visit Plan hip/knee strengthening/ walking, stair training, continue toward goals, FOTO, Renewal, Assess goals   PT Home Exercise Plan HEP Review   Consulted and Agree with Plan of Care Patient        Problem List Patient Active Problem List   Diagnosis Date Noted  . Bilateral lower extremity edema 08/15/2015  . Skin thinning 07/23/2015  . S/P lumbar spine operation 07/14/2015  . Deep vein thrombosis 06/30/2015  . Reduced mobility 06/19/2015  . Degenerative spondylolisthesis 06/17/2015  . SPL (spondylolisthesis) 06/12/2015  . CN (constipation) 06/04/2015  . Arteriosclerosis of coronary artery 06/04/2015   .  Diastolic dysfunction with heart failure 06/04/2015  . History of digestive disease 06/04/2015  . HLD (hyperlipidemia) 06/04/2015  . BP (high blood pressure) 06/04/2015  . PONV (postoperative nausea and vomiting) 06/04/2015  . OP (osteoporosis) 06/04/2015  . Lumbar canal stenosis 04/07/2015  . CAD (coronary artery disease) 03/03/2012  . HTN (hypertension) 03/03/2012  . Hyperlipidemia 03/03/2012  . GERD (gastroesophageal reflux disease) 03/03/2012   Starr Lake PT, DPT, LAT, ATC  09/02/2015  11:35 AM      [facility] [address] [place], [place], [address] Phone: [phone]   Fax:  [phone]

## 2015-09-03 DIAGNOSIS — L821 Other seborrheic keratosis: Secondary | ICD-10-CM | POA: Diagnosis not present

## 2015-09-03 DIAGNOSIS — Z85828 Personal history of other malignant neoplasm of skin: Secondary | ICD-10-CM | POA: Diagnosis not present

## 2015-09-03 DIAGNOSIS — D692 Other nonthrombocytopenic purpura: Secondary | ICD-10-CM | POA: Diagnosis not present

## 2015-09-03 DIAGNOSIS — D225 Melanocytic nevi of trunk: Secondary | ICD-10-CM | POA: Diagnosis not present

## 2015-09-03 DIAGNOSIS — D1801 Hemangioma of skin and subcutaneous tissue: Secondary | ICD-10-CM | POA: Diagnosis not present

## 2015-09-05 ENCOUNTER — Ambulatory Visit: Payer: Medicare Other | Admitting: Physical Therapy

## 2015-09-05 DIAGNOSIS — R29898 Other symptoms and signs involving the musculoskeletal system: Secondary | ICD-10-CM

## 2015-09-05 DIAGNOSIS — R2681 Unsteadiness on feet: Secondary | ICD-10-CM

## 2015-09-05 DIAGNOSIS — R293 Abnormal posture: Secondary | ICD-10-CM | POA: Diagnosis not present

## 2015-09-05 DIAGNOSIS — Z9181 History of falling: Secondary | ICD-10-CM | POA: Diagnosis not present

## 2015-09-05 NOTE — Therapy (Signed)
Molly [name], [place], [address] Phone: [phone]   Fax:  [phone]  Physical Therapy Treatment / Re-certification  Patient Details  Name: Molly [name] MRN: [id number] Date of Birth: 1929/12/01 Referring Provider:  [name], MD  Encounter Date: 09/05/2015      PT End of Session - 09/05/15 1125    Visit Number 11   Number of Visits 18   Date for PT Re-Evaluation 10/10/15   Authorization Type Medicare KX modifier by the 15th visit, progress note on 8th or 9th visit.    PT Start Time 1015   PT Stop Time 1115   PT Time Calculation (min) 60 min   Activity Tolerance Patient tolerated treatment well   Behavior During Therapy WFL for tasks assessed/performed      Past Medical History  Diagnosis Date  . CAD (coronary artery disease)   . Spinal stenosis   . Acid reflux   . Arthritis   . Osteoporosis   . Hyperlipidemia   . High cholesterol   . Hypertension   . Anemia     Past Surgical History  Procedure Laterality Date  . Carotid stent    . Abdominal hysterectomy      There were no vitals filed for this visit.  Visit Diagnosis:  Weakness of both legs - Plan: PT plan of care cert/re-cert  Unsteadiness - Plan: PT plan of care cert/re-cert  Risk for falls - Plan: PT plan of care cert/re-cert  Abnormal posture - Plan: PT plan of care cert/re-cert      Subjective Assessment - 09/05/15 1028    Subjective "I am doing pretty good, I did have lady scratch my leg after getting a pedicure"   Currently in Pain? No/denies   Pain Score 0-No pain   Pain Location Leg   Pain Orientation Right;Left   Pain Descriptors / Indicators --  weakness   Pain Onset More than a month ago   Pain Frequency Constant            [facility] PT Assessment - 09/05/15 0001    Observation/Other Assessments   Focus on Therapeutic Outcomes (FOTO)  71% limited   Strength   Right Hip Flexion 4-/5   Right Hip  Extension 4-/5   Right Hip ABduction 4-/5   Right Hip ADduction 4-/5   Left Hip Flexion 4-/5   Left Hip Extension 4-/5   Left Hip ABduction 4-/5   Left Hip ADduction 4-/5   Right Knee Flexion 4-/5   Right Knee Extension 4-/5   Left Knee Flexion 4/5   Left Knee Extension 4/5   Right Ankle Dorsiflexion 3-/5   Right Ankle Plantar Flexion 3+/5   Right Ankle Inversion 3-/5   Right Ankle Eversion 3-/5   Left Ankle Dorsiflexion 4-/5   Left Ankle Plantar Flexion 4-/5   Left Ankle Inversion 3/5   Left Ankle Eversion 3/5                     [facility] Adult PT Treatment/Exercise - 09/05/15 0001    Self-Care   Self-Care Other Self-Care Comments   Other Self-Care Comments  how to get into and out of bed utilizing modified log roll in order to assist getting legs into and out of the bed. discussed with pt to work on [place] activities at home instead of having the caregivers do all the work for her.    Knee/Hip Exercises: Aerobic   Recumbent Bike 7 x  6 min, L 9 x 4 min   Knee/Hip Exercises: Seated   Long Arc Quad Strengthening;Both;2 sets;15 reps   Sit to [employer] 2 sets;5 reps   Knee/Hip Exercises: Supine   Other Supine Knee/Hip Exercises supine<> sit x 3 - modified log roll in order to help pt get in and out of bed indepedently                PT Education - 09/05/15 1130    Education provided Yes   Education Details HEP review, supine<>sit transfers, discussed group therapy class after therapy   Person(s) Educated Patient   Methods Explanation   Comprehension Verbalized understanding          PT Short Term Goals - 09/05/15 1131    PT SHORT TERM GOAL #1   Title pt will be I with basic HEP (08/11/2015)   Baseline needs cues   Time 4   Period Weeks   Status Achieved   PT SHORT TERM GOAL #2   Title She will increase bil hip/knee strength to > 4-/5 to assist with walking and standing strength to promote (08/11/2015)   Time 4   Period Weeks   Status Achieved   PT  SHORT TERM GOAL #3   Title pt will be able to stand/walking for > 10 minutes with LRAD for safety to increase walking/ standing endurance (08/11/2015)   Baseline can now walk 2 blocks.  Not sure how long it took.   Time 4   Period Weeks   Status Achieved   PT SHORT TERM GOAL #4   Title She will be able to verbalize and demonstrates techniques to protect the spine by log rolling/transitions, postural awarenss, proper DME use, and HEP (08/11/2015)   Baseline needed reminding of hand placement in clinic 1 X today   Time 4   Period Weeks   Status Achieved   PT SHORT TERM GOAL #5   Title She will increase her FOTO score by > 5 points to help with functional progression (08/11/2015)   Time 4   Period Weeks   Status Achieved           PT Long Term Goals - 09/05/15 1131    PT LONG TERM GOAL #1   Title upon discharge pt will be I with all HEP given throughout therapy (09/05/2015)   Time 8   Period Weeks   Status On-going   PT LONG TERM GOAL #2   Title pt will increase bil hip/knee strength to > 4/5 and ankle strenght to >4-/5 to help with safety during walking and prolonged standing with LRAD (09/05/2015)   Time 8   Period Weeks   Status On-going   PT LONG TERM GOAL #3   Title she will be able to perform walking/ standing for > 15 minutes with LRAD without stopping to rest to increase walking endurance and safety. (09/05/2015)   Time 8   Period Weeks   Status On-going   PT LONG TERM GOAL #4   Title she will increase her 10 MWT by > 10 seconds to help progress towards community ambulation to help with personal goal of being able to go shopping (09/05/2015)   Baseline .13 M/s inital evaluation   Time 8   Period Weeks   Status On-going   PT LONG TERM GOAL #5   Title She will increase her FOTO score to 35 to indicate improved functional capacity upon discharge (09/05/2015)   Time 8   Period Weeks  Status Revised               Plan - 2015/09/28 1126    Clinical Impression  Statement Jayelyn continues to make progress with increased strength and endurance. pt met all STGs today. She improved her FOTO score to from 0 to 29. discussed with pt regarding her caregiver situation at home that they are there to help but not to do the work for her.  Focused todays session on supine<>sit, initially she couldnt get her feet onto the table. Modified the log roll and she was able to go from supine<>sit independenlty. She would benefit from continued therapy to improve strength, independent transfers and endurance. Plan to continue with current POC.   Pt will benefit from skilled therapeutic intervention in order to improve on the following deficits Decreased activity tolerance;Decreased balance;Decreased endurance;Decreased strength;Hypomobility;Difficulty walking;Decreased safety awareness;Postural dysfunction;Prosthetic Dependency;Improper body mechanics;Decreased mobility;Abnormal gait;Decreased range of motion;Impaired flexibility   Rehab Potential Good   PT Frequency 2x / week   PT Duration 4 weeks   PT Treatment/Interventions ADLs/Self Care Home Management;Cryotherapy;Moist Heat;DME Instruction;Gait training;Stair training;Functional mobility training;Therapeutic activities;Therapeutic exercise;Balance training;Neuromuscular re-education;Patient/family education;Manual techniques;Passive range of motion;Dry needling   PT Next Visit Plan hip/knee strengthening/ walking, stair training, continue toward goals, FOTO, Renewal, Assess goals   PT Home Exercise Plan supine<> sit transfers   Consulted and Agree with Plan of Care Patient          G-Codes - 28-Sep-2015 1132    Functional Assessment Tool Used FOTO 71% limited   Functional Limitation Mobility: Walking and moving around   Mobility: Walking and Moving Around Current Status [phone]) At least 60 percent but less than 80 percent impaired, limited or restricted   Mobility: Walking and Moving Around Goal Status [phone]) At least  60 percent but less than 80 percent impaired, limited or restricted      Problem List Patient Active Problem List   Diagnosis Date Noted  . Bilateral lower extremity edema 08/15/2015  . Skin thinning 07/23/2015  . S/P lumbar spine operation 07/14/2015  . Deep vein thrombosis 06/30/2015  . Reduced mobility 06/19/2015  . Degenerative spondylolisthesis 06/17/2015  . SPL (spondylolisthesis) 06/12/2015  . CN (constipation) 06/04/2015  . Arteriosclerosis of coronary artery 06/04/2015  . Diastolic dysfunction with heart failure 06/04/2015  . History of digestive disease 06/04/2015  . HLD (hyperlipidemia) 06/04/2015  . BP (high blood pressure) 06/04/2015  . PONV (postoperative nausea and vomiting) 06/04/2015  . OP (osteoporosis) 06/04/2015  . Lumbar canal stenosis 04/07/2015  . CAD (coronary artery disease) 03/03/2012  . HTN (hypertension) 03/03/2012  . Hyperlipidemia 03/03/2012  . GERD (gastroesophageal reflux disease) 03/03/2012   Starr Lake PT, DPT, LAT, ATC  09-28-15  11:39 AM      [facility] [address] [place], [place], [address] Phone: [phone]   Fax:  [phone]

## 2015-09-09 ENCOUNTER — Ambulatory Visit: Payer: Medicare Other | Admitting: Physical Therapy

## 2015-09-09 DIAGNOSIS — R293 Abnormal posture: Secondary | ICD-10-CM | POA: Diagnosis not present

## 2015-09-09 DIAGNOSIS — Z9181 History of falling: Secondary | ICD-10-CM

## 2015-09-09 DIAGNOSIS — I5030 Unspecified diastolic (congestive) heart failure: Secondary | ICD-10-CM | POA: Diagnosis not present

## 2015-09-09 DIAGNOSIS — M4806 Spinal stenosis, lumbar region: Secondary | ICD-10-CM | POA: Diagnosis not present

## 2015-09-09 DIAGNOSIS — Z9889 Other specified postprocedural states: Secondary | ICD-10-CM | POA: Diagnosis not present

## 2015-09-09 DIAGNOSIS — R29898 Other symptoms and signs involving the musculoskeletal system: Secondary | ICD-10-CM | POA: Diagnosis not present

## 2015-09-09 DIAGNOSIS — G8929 Other chronic pain: Secondary | ICD-10-CM | POA: Diagnosis not present

## 2015-09-09 DIAGNOSIS — Z7409 Other reduced mobility: Secondary | ICD-10-CM | POA: Diagnosis not present

## 2015-09-09 DIAGNOSIS — M5417 Radiculopathy, lumbosacral region: Secondary | ICD-10-CM | POA: Diagnosis not present

## 2015-09-09 DIAGNOSIS — I10 Essential (primary) hypertension: Secondary | ICD-10-CM | POA: Diagnosis not present

## 2015-09-09 DIAGNOSIS — R2681 Unsteadiness on feet: Secondary | ICD-10-CM | POA: Diagnosis not present

## 2015-09-09 NOTE — Therapy (Signed)
[place], [place], [address] Phone: [phone]   Fax:  [phone]  Physical Therapy Treatment  Patient Details  Name: Molly [name] MRN: [id number] Date of Birth: 01-09-1929 Referring Provider:  Kerney Elbe, [place]*  Encounter Date: 09/09/2015      PT End of Session - 09/09/15 1341    Visit Number 12   Number of Visits 18   Date for PT Re-Evaluation 10/10/15   PT Start Time 1017   PT Stop Time 3299   PT Time Calculation (min) 48 min   Activity Tolerance Patient tolerated treatment well;No increased pain   Behavior During Therapy [facility] for tasks assessed/performed      Past Medical History  Diagnosis Date  . CAD (coronary artery disease)   . Spinal stenosis   . Acid reflux   . Arthritis   . Osteoporosis   . Hyperlipidemia   . High cholesterol   . Hypertension   . Anemia     Past Surgical History  Procedure Laterality Date  . Carotid stent    . Abdominal hysterectomy      There were no vitals filed for this visit.  Visit Diagnosis:  Weakness of both legs  Unsteadiness  Risk for falls  Abnormal posture      Subjective Assessment - 09/09/15 1048    Subjective No pain,  Showed her health care worker how she can get into her bed alone.     Currently in Pain? No/denies                         [facility] Adult PT Treatment/Exercise - 09/09/15 0001    Transfers   Five time sit to stand comments  5 reps 2 sets needs hands from mat, from elevated mat able to do 5 X no hands.   Comments able to stand to supine to stand without cues.  SBA   Ambulation/Gait   Stairs Yes   Stairs Assistance 4: Min guard   Number of Stairs 8   Height of Stairs --  4, 6 inches 2 rails, contact guard   Knee/Hip Exercises: Aerobic   Nustep 10 minutes, L6                   PT Short Term Goals - 09/05/15 1131    PT SHORT TERM GOAL #1   Title pt will be I with basic HEP  (08/11/2015)   Baseline needs cues   Time 4   Period Weeks   Status Achieved   PT SHORT TERM GOAL #2   Title She will increase bil hip/knee strength to > 4-/5 to assist with walking and standing strength to promote (08/11/2015)   Time 4   Period Weeks   Status Achieved   PT SHORT TERM GOAL #3   Title pt will be able to stand/walking for > 10 minutes with LRAD for safety to increase walking/ standing endurance (08/11/2015)   Baseline can now walk 2 blocks.  Not sure how long it took.   Time 4   Period Weeks   Status Achieved   PT SHORT TERM GOAL #4   Title She will be able to verbalize and demonstrates techniques to protect the spine by log rolling/transitions, postural awarenss, proper DME use, and HEP (08/11/2015)   Baseline needed reminding of hand placement in clinic 1 X today   Time 4   Period Weeks   Status Achieved  PT SHORT TERM GOAL #5   Title She will increase her FOTO score by > 5 points to help with functional progression (08/11/2015)   Time 4   Period Weeks   Status Achieved           PT Long Term Goals - 09/09/15 1345    PT LONG TERM GOAL #1   Title upon discharge pt will be I with all HEP given throughout therapy (09/05/2015)   Time 8   Period Weeks   Status On-going   PT LONG TERM GOAL #2   Title pt will increase bil hip/knee strength to > 4/5 and ankle strenght to >4-/5 to help with safety during walking and prolonged standing with LRAD (09/05/2015)   Time 8   Period Weeks   Status On-going   PT LONG TERM GOAL #3   Title she will be able to perform walking/ standing for > 15 minutes with LRAD without stopping to rest to increase walking endurance and safety. (09/05/2015)   Time 8   Period Weeks   Status On-going   PT LONG TERM GOAL #4   Title she will increase her 10 MWT by > 10 seconds to help progress towards community ambulation to help with personal goal of being able to go shopping (09/05/2015)   Time 8   Period Weeks   Status Unable to assess   PT  LONG TERM GOAL #5   Period Weeks   Status Revised               Plan - 09/09/15 1342    Clinical Impression Statement SBA only for transfers at home now.  Able to ascend steps step over step, descends one step at a time.  Sit to stand from lower surface a challange.     PT Next Visit Plan hip/knee strengthening/ walking, stair training, continue toward goals,   Consulted and Agree with Plan of Care Patient;Family member/caregiver   Family Member Consulted daughter        Problem List Patient Active Problem List   Diagnosis Date Noted  . Bilateral lower extremity edema 08/15/2015  . Skin thinning 07/23/2015  . S/P lumbar spine operation 07/14/2015  . Deep vein thrombosis 06/30/2015  . Reduced mobility 06/19/2015  . Degenerative spondylolisthesis 06/17/2015  . SPL (spondylolisthesis) 06/12/2015  . CN (constipation) 06/04/2015  . Arteriosclerosis of coronary artery 06/04/2015  . Diastolic dysfunction with heart failure 06/04/2015  . History of digestive disease 06/04/2015  . HLD (hyperlipidemia) 06/04/2015  . BP (high blood pressure) 06/04/2015  . PONV (postoperative nausea and vomiting) 06/04/2015  . OP (osteoporosis) 06/04/2015  . Lumbar canal stenosis 04/07/2015  . CAD (coronary artery disease) 03/03/2012  . HTN (hypertension) 03/03/2012  . Hyperlipidemia 03/03/2012  . GERD (gastroesophageal reflux disease) 03/03/2012    [facility] 09/09/2015, 1:48 PM  [facility] [address] [place], [place], [address] Phone: [phone]   Fax:  [phone]     Melvenia Needles, PTA 09/09/2015 1:48 PM Phone: [phone] Fax: [phone]

## 2015-09-10 DIAGNOSIS — Z79899 Other long term (current) drug therapy: Secondary | ICD-10-CM | POA: Diagnosis not present

## 2015-09-10 DIAGNOSIS — M4316 Spondylolisthesis, lumbar region: Secondary | ICD-10-CM | POA: Diagnosis not present

## 2015-09-10 DIAGNOSIS — M438X5 Other specified deforming dorsopathies, thoracolumbar region: Secondary | ICD-10-CM | POA: Diagnosis not present

## 2015-09-10 DIAGNOSIS — Z9889 Other specified postprocedural states: Secondary | ICD-10-CM | POA: Diagnosis not present

## 2015-09-10 DIAGNOSIS — Z4789 Encounter for other orthopedic aftercare: Secondary | ICD-10-CM | POA: Diagnosis not present

## 2015-09-10 DIAGNOSIS — Z8781 Personal history of (healed) traumatic fracture: Secondary | ICD-10-CM | POA: Diagnosis not present

## 2015-09-10 DIAGNOSIS — G9589 Other specified diseases of spinal cord: Secondary | ICD-10-CM | POA: Diagnosis not present

## 2015-09-10 DIAGNOSIS — Z981 Arthrodesis status: Secondary | ICD-10-CM | POA: Diagnosis not present

## 2015-09-11 ENCOUNTER — Ambulatory Visit: Payer: Medicare Other | Admitting: Physical Therapy

## 2015-09-11 DIAGNOSIS — R293 Abnormal posture: Secondary | ICD-10-CM

## 2015-09-11 DIAGNOSIS — R2681 Unsteadiness on feet: Secondary | ICD-10-CM | POA: Diagnosis not present

## 2015-09-11 DIAGNOSIS — Z9181 History of falling: Secondary | ICD-10-CM

## 2015-09-11 DIAGNOSIS — R29898 Other symptoms and signs involving the musculoskeletal system: Secondary | ICD-10-CM

## 2015-09-11 NOTE — Patient Instructions (Signed)
   Kristoffer Leamon PT, DPT, LAT, ATC  [facility] Phone: [phone]     

## 2015-09-11 NOTE — Therapy (Signed)
[place]olly [name], [place], [address] Phone: [phone]   Fax:  [phone]  Physical Therapy Treatment  Patient Details  Name: Molly [name] MRN: [id number] Date of Birth: [birth year] Referring Provider:  [name], MD  Encounter Date: 09/11/2015      PT End of Session - 09/11/15 1158    Visit Number 13   Number of Visits 18   Date for PT Re-Evaluation 10/10/15   Authorization Type Medicare KX modifier by the 15th visit, progress note on 8th or 9th visit.    PT Start Time 1015   PT Stop Time 1100   PT Time Calculation (min) 45 min   Activity Tolerance Patient tolerated treatment well      Past Medical History  Diagnosis Date  . CAD (coronary artery disease)   . Spinal stenosis   . Acid reflux   . Arthritis   . Osteoporosis   . Hyperlipidemia   . High cholesterol   . Hypertension   . Anemia     Past Surgical History  Procedure Laterality Date  . Carotid stent    . Abdominal hysterectomy      There were no vitals filed for this visit.  Visit Diagnosis:  Weakness of both legs  Unsteadiness  Risk for falls  Abnormal posture      Subjective Assessment - 09/11/15 1044    Subjective pt reports no pain today, an dis able to get in and out of bed better    Currently in Pain? No/denies   Pain Score 0-No pain   Pain Location Leg   Pain Orientation Left;Right            [facility] PT Assessment - 09/11/15 0001    Ambulation/Gait   Stairs Yes   Stairs Assistance 5: Supervision   Stair Management Technique Two rails;Alternating pattern   Number of Stairs 24   Height of Stairs 4                     [facility] Adult PT Treatment/Exercise - 09/11/15 0001    Self-Care   Self-Care Posture   Posture standing up straight to decrease low back and thoracic problems.    Knee/Hip Exercises: Aerobic   Nustep 5 min L6, 5 min L8  using LE only   Knee/Hip Exercises: Standing   Other Standing Knee Exercises standing hip extension/abd 2 x 10, marching in place 2#,    Knee/Hip Exercises: Seated   Long Arc Quad Strengthening;Both;2 sets;15 reps   Long Arc Quad Weight 4 lbs.   Sit to Sand 2 sets;5 reps  from table height, required min assist +1   Knee/Hip Exercises: Supine   Bridges with Clamshell Both;Strengthening;1 set;15 reps   Straight Leg Raises AROM;Strengthening;Both;2 sets;15 reps  2#   Other Supine Knee/Hip Exercises supine<> sit x 3 - modified log roll in order to help pt get in and out of bed indepedently   Other Supine Knee/Hip Exercises supine marching with 2# ea. 2 x 15                PT Education - 09/11/15 1157    Education provided Yes   Education Details updated HEP   Person(s) Educated Patient   Methods Explanation   Comprehension Verbalized understanding          PT Short Term Goals - 09/05/15 1131    PT SHORT TERM GOAL #1   Title pt will be I with basic  HEP (08/11/2015)   Baseline needs cues   Time 4   Period Weeks   Status Achieved   PT SHORT TERM GOAL #2   Title She will increase bil hip/knee strength to > 4-/5 to assist with walking and standing strength to promote (08/11/2015)   Time 4   Period Weeks   Status Achieved   PT SHORT TERM GOAL #3   Title pt will be able to stand/walking for > 10 minutes with LRAD for safety to increase walking/ standing endurance (08/11/2015)   Baseline can now walk 2 blocks.  Not sure how long it took.   Time 4   Period Weeks   Status Achieved   PT SHORT TERM GOAL #4   Title She will be able to verbalize and demonstrates techniques to protect the spine by log rolling/transitions, postural awarenss, proper DME use, and HEP (08/11/2015)   Baseline needed reminding of hand placement in clinic 1 X today   Time 4   Period Weeks   Status Achieved   PT SHORT TERM GOAL #5   Title She will increase her FOTO score by > 5 points to help with functional progression (08/11/2015)   Time 4   Period  Weeks   Status Achieved           PT Long Term Goals - 09/09/15 1345    PT LONG TERM GOAL #1   Title upon discharge pt will be I with all HEP given throughout therapy (09/05/2015)   Time 8   Period Weeks   Status On-going   PT LONG TERM GOAL #2   Title pt will increase bil hip/knee strength to > 4/5 and ankle strenght to >4-/5 to help with safety during walking and prolonged standing with LRAD (09/05/2015)   Time 8   Period Weeks   Status On-going   PT LONG TERM GOAL #3   Title she will be able to perform walking/ standing for > 15 minutes with LRAD without stopping to rest to increase walking endurance and safety. (09/05/2015)   Time 8   Period Weeks   Status On-going   PT LONG TERM GOAL #4   Title she will increase her 10 MWT by > 10 seconds to help progress towards community ambulation to help with personal goal of being able to go shopping (09/05/2015)   Time 8   Period Weeks   Status Unable to assess   PT LONG TERM GOAL #5   Period Weeks   Status Revised               Plan - 09/11/15 1158    Clinical Impression Statement [name] continues to make progress and is able to get into and out of the bed without assistance. She was able to complete all exercises and proferm sit to stands with minimal assitance from the table. Worked on 4 inch step stairsx 3 which she could do wiht supervision. Plan to progress with CKC strengthening.    PT Next Visit Plan hip/knee strengthening/ walking, stair training, continue toward goals,   PT Home Exercise Plan thoracic extension over chair, supine marching   Consulted and Agree with Plan of Care Patient;Family member/caregiver   Family Member Consulted daughter        Problem List Patient Active Problem List   Diagnosis Date Noted  . Bilateral lower extremity edema 08/15/2015  . Skin thinning 07/23/2015  . S/P lumbar spine operation 07/14/2015  . Deep vein thrombosis 06/30/2015  . Reduced mobility  06/19/2015  .  Degenerative spondylolisthesis 06/17/2015  . SPL (spondylolisthesis) 06/12/2015  . CN (constipation) 06/04/2015  . Arteriosclerosis of coronary artery 06/04/2015  . Diastolic dysfunction with heart failure 06/04/2015  . History of digestive disease 06/04/2015  . HLD (hyperlipidemia) 06/04/2015  . BP (high blood pressure) 06/04/2015  . PONV (postoperative nausea and vomiting) 06/04/2015  . OP (osteoporosis) 06/04/2015  . Lumbar canal stenosis 04/07/2015  . CAD (coronary artery disease) 03/03/2012  . HTN (hypertension) 03/03/2012  . Hyperlipidemia 03/03/2012  . GERD (gastroesophageal reflux disease) 03/03/2012   Starr Lake PT, DPT, LAT, ATC  09/11/2015  12:05 PM      [facility] [address] [place], [place], [address] Phone: [phone]   Fax:  [phone]

## 2015-09-15 DIAGNOSIS — M810 Age-related osteoporosis without current pathological fracture: Secondary | ICD-10-CM | POA: Diagnosis not present

## 2015-09-15 DIAGNOSIS — I129 Hypertensive chronic kidney disease with stage 1 through stage 4 chronic kidney disease, or unspecified chronic kidney disease: Secondary | ICD-10-CM | POA: Diagnosis not present

## 2015-09-15 DIAGNOSIS — Z23 Encounter for immunization: Secondary | ICD-10-CM | POA: Diagnosis not present

## 2015-09-15 DIAGNOSIS — D649 Anemia, unspecified: Secondary | ICD-10-CM | POA: Diagnosis not present

## 2015-09-15 DIAGNOSIS — N183 Chronic kidney disease, stage 3 (moderate): Secondary | ICD-10-CM | POA: Diagnosis not present

## 2015-09-15 DIAGNOSIS — M4800 Spinal stenosis, site unspecified: Secondary | ICD-10-CM | POA: Diagnosis not present

## 2015-09-15 DIAGNOSIS — R609 Edema, unspecified: Secondary | ICD-10-CM | POA: Diagnosis not present

## 2015-09-16 ENCOUNTER — Ambulatory Visit: Payer: Medicare Other | Attending: Surgical | Admitting: Physical Therapy

## 2015-09-16 DIAGNOSIS — R2681 Unsteadiness on feet: Secondary | ICD-10-CM | POA: Insufficient documentation

## 2015-09-16 DIAGNOSIS — Z9181 History of falling: Secondary | ICD-10-CM

## 2015-09-16 DIAGNOSIS — R29898 Other symptoms and signs involving the musculoskeletal system: Secondary | ICD-10-CM | POA: Diagnosis not present

## 2015-09-16 DIAGNOSIS — R293 Abnormal posture: Secondary | ICD-10-CM | POA: Diagnosis not present

## 2015-09-16 NOTE — Therapy (Signed)
[place], [place], [address] Phone: [phone]   Fax:  [phone]  Physical Therapy Treatment  Patient Details  Name: Molly [name] MRN: [id number] Date of Birth: 1929/06/19 Referring Provider:  [name], MD  Encounter Date: 09/16/2015      PT End of Session - 09/16/15 1323    Visit Number 14   Number of Visits 18   Date for PT Re-Evaluation 10/10/15   PT Start Time 1020   PT Stop Time 1102   PT Time Calculation (min) 42 min   Activity Tolerance Patient tolerated treatment well;No increased pain   Behavior During Therapy [facility] for tasks assessed/performed      Past Medical History  Diagnosis Date  . CAD (coronary artery disease)   . Spinal stenosis   . Acid reflux   . Arthritis   . Osteoporosis   . Hyperlipidemia   . High cholesterol   . Hypertension   . Anemia     Past Surgical History  Procedure Laterality Date  . Carotid stent    . Abdominal hysterectomy      There were no vitals filed for this visit.  Visit Diagnosis:  Weakness of both legs  Unsteadiness  Risk for falls  Abnormal posture                       [facility] Adult PT Treatment/Exercise - 09/16/15 0001    Ambulation/Gait   Ambulation/Gait Yes   Stairs Yes   Stairs Assistance --  contact guard, gait belt   Number of Stairs --  12 at least   Height of Stairs 6   Lumbar Exercises: Stretches   Single Knee to Chest Stretch Limitations 3 reps, to stretch out hips, low back.  Lt hip limited to 90 degrees.     Lumbar Exercises: Supine   Bent Knee Raise Limitations 10 X able to lift legs easier   Knee/Hip Exercises: Seated   Marching --  feet march with dorsiflexion,very small motions   Sit to Sand --  5X needed to use hands, less rocking needed   Knee/Hip Exercises: Supine   Other Supine Knee/Hip Exercises ankle circles and pumps.                    PT Short Term Goals -  09/05/15 1131    PT SHORT TERM GOAL #1   Title pt will be I with basic HEP (08/11/2015)   Baseline needs cues   Time 4   Period Weeks   Status Achieved   PT SHORT TERM GOAL #2   Title She will increase bil hip/knee strength to > 4-/5 to assist with walking and standing strength to promote (08/11/2015)   Time 4   Period Weeks   Status Achieved   PT SHORT TERM GOAL #3   Title pt will be able to stand/walking for > 10 minutes with LRAD for safety to increase walking/ standing endurance (08/11/2015)   Baseline can now walk 2 blocks.  Not sure how long it took.   Time 4   Period Weeks   Status Achieved   PT SHORT TERM GOAL #4   Title She will be able to verbalize and demonstrates techniques to protect the spine by log rolling/transitions, postural awarenss, proper DME use, and HEP (08/11/2015)   Baseline needed reminding of hand placement in clinic 1 X today   Time 4   Period Weeks   Status  Achieved   PT SHORT TERM GOAL #5   Title She will increase her FOTO score by > 5 points to help with functional progression (08/11/2015)   Time 4   Period Weeks   Status Achieved           PT Long Term Goals - 09/16/15 1327    PT LONG TERM GOAL #1   Title upon discharge pt will be I with all HEP given throughout therapy (09/05/2015)   Time 8   Period Weeks   Status On-going   PT LONG TERM GOAL #2   Title pt will increase bil hip/knee strength to > 4/5 and ankle strenght to >4-/5 to help with safety during walking and prolonged standing with LRAD (09/05/2015)   Time 8   Period Weeks   Status On-going   PT LONG TERM GOAL #3   Title she will be able to perform walking/ standing for > 15 minutes with LRAD without stopping to rest to increase walking endurance and safety. (09/05/2015)   Baseline she could not say how long.     Time 8   Period Weeks   Status On-going   PT LONG TERM GOAL #4   Title she will increase her 10 MWT by > 10 seconds to help progress towards community ambulation to help  with personal goal of being able to go shopping (09/05/2015)   Baseline able to go shopping in [place].  10 MWT not assessed   Time 8   Period Weeks   Status Partially Met   PT LONG TERM GOAL #5   Title She will increase her FOTO score to 35 to indicate improved functional capacity upon discharge (09/05/2015)   Status Unable to assess               Plan - 09/16/15 1324    Clinical Impression Statement Now wearing thigh high support hose.  These may have made leg movements during exercises easier.  Patient has very little ankle movement RT and she does not use all of the DF she has.  toe catches with gait in clinic 2 X today. She does not have a chair at home with a back she can stretch her thoracis over.   PT Next Visit Plan hip/knee strengthening/ walking, stair training, continue toward goals,   PT Home Exercise Plan ankle movements, every hour.   Consulted and Agree with Plan of Care Family member/caregiver   Family Member Consulted daughter        Problem List Patient Active Problem List   Diagnosis Date Noted  . Bilateral lower extremity edema 08/15/2015  . Skin thinning 07/23/2015  . S/P lumbar spine operation 07/14/2015  . Deep vein thrombosis ([place]) 06/30/2015  . Reduced mobility 06/19/2015  . Degenerative spondylolisthesis 06/17/2015  . SPL (spondylolisthesis) 06/12/2015  . CN (constipation) 06/04/2015  . Arteriosclerosis of coronary artery 06/04/2015  . Diastolic dysfunction with heart failure ([place]) 06/04/2015  . History of digestive disease 06/04/2015  . HLD (hyperlipidemia) 06/04/2015  . BP (high blood pressure) 06/04/2015  . PONV (postoperative nausea and vomiting) 06/04/2015  . OP (osteoporosis) 06/04/2015  . Lumbar canal stenosis 04/07/2015  . CAD (coronary artery disease) 03/03/2012  . HTN (hypertension) 03/03/2012  . Hyperlipidemia 03/03/2012  . GERD (gastroesophageal reflux disease) 03/03/2012    HARRIS,KAREN 09/16/2015, 1:33 PM  [facility] [address] [place], [place], [address] Phone: [phone]   Fax:  [phone]     Melvenia Needles, PTA  09/16/2015 1:33 PM Phone: [phone] Fax: [phone]

## 2015-09-16 NOTE — Patient Instructions (Signed)
More ankle pumps and circles at home,  Sitting or reclined.

## 2015-09-18 ENCOUNTER — Ambulatory Visit: Payer: Medicare Other | Admitting: Physical Therapy

## 2015-09-18 DIAGNOSIS — R293 Abnormal posture: Secondary | ICD-10-CM | POA: Diagnosis not present

## 2015-09-18 DIAGNOSIS — Z9181 History of falling: Secondary | ICD-10-CM

## 2015-09-18 DIAGNOSIS — R29898 Other symptoms and signs involving the musculoskeletal system: Secondary | ICD-10-CM | POA: Diagnosis not present

## 2015-09-18 DIAGNOSIS — R2681 Unsteadiness on feet: Secondary | ICD-10-CM | POA: Diagnosis not present

## 2015-09-18 NOTE — Patient Instructions (Signed)
May practice walking with cane or walking stick at home with gait belt and contact guard on level.

## 2015-09-18 NOTE — Therapy (Signed)
[place], [place], [address] Phone: [phone]   Fax:  [phone]  Physical Therapy Treatment  Patient Details  Name: Molly [name] MRN: [id number] Date of Birth: 08-11-1929 Referring Provider:  Kerney Elbe, [place]*  Encounter Date: 09/18/2015      PT End of Session - 09/18/15 1321    Visit Number 15   Number of Visits 18   Date for PT Re-Evaluation 10/10/15   PT Start Time 1017   PT Stop Time 1100   PT Time Calculation (min) 43 min   Equipment Utilized During Treatment Gait belt   Activity Tolerance Patient tolerated treatment well;No increased pain   Behavior During Therapy [facility] for tasks assessed/performed      Past Medical History  Diagnosis Date  . CAD (coronary artery disease)   . Spinal stenosis   . Acid reflux   . Arthritis   . Osteoporosis   . Hyperlipidemia   . High cholesterol   . Hypertension   . Anemia     Past Surgical History  Procedure Laterality Date  . Carotid stent    . Abdominal hysterectomy      There were no vitals filed for this visit.  Visit Diagnosis:  Weakness of both legs  Unsteadiness  Risk for falls  Abnormal posture      Subjective Assessment - 09/18/15 1316    Subjective Daughter said MD told patient she could start using a cane.  NO pain   Currently in Pain? No/denies                         [facility] Adult PT Treatment/Exercise - 09/18/15 1017    Ambulation/Gait   Assistive device Straight cane   Gait Comments on level. multiple cues gait training.  Some LT low back pain reported with walking, improved with cue to stand tall with walking.     High Level Balance   High Level Balance Comments gait belt used with these.  side steps, forward back with cues for longer step length.     Knee/Hip Exercises: Aerobic   Nustep 10 minutes L8                PT Education - 09/18/15 1320    Education provided Yes   Education Details gait training with cane   Person(s) Educated Patient;Child(ren)   Methods Explanation;Demonstration;Tactile cues;Verbal cues   Comprehension Verbalized understanding;Returned demonstration;Need further instruction          PT Short Term Goals - 09/05/15 1131    PT SHORT TERM GOAL #1   Title pt will be I with basic HEP (08/11/2015)   Baseline needs cues   Time 4   Period Weeks   Status Achieved   PT SHORT TERM GOAL #2   Title She will increase bil hip/knee strength to > 4-/5 to assist with walking and standing strength to promote (08/11/2015)   Time 4   Period Weeks   Status Achieved   PT SHORT TERM GOAL #3   Title pt will be able to stand/walking for > 10 minutes with LRAD for safety to increase walking/ standing endurance (08/11/2015)   Baseline can now walk 2 blocks.  Not sure how long it took.   Time 4   Period Weeks   Status Achieved   PT SHORT TERM GOAL #4   Title She will be able to verbalize and demonstrates techniques to protect the spine by  log rolling/transitions, postural awarenss, proper DME use, and HEP (08/11/2015)   Baseline needed reminding of hand placement in clinic 1 X today   Time 4   Period Weeks   Status Achieved   PT SHORT TERM GOAL #5   Title She will increase her FOTO score by > 5 points to help with functional progression (08/11/2015)   Time 4   Period Weeks   Status Achieved           PT Long Term Goals - 09/16/15 1327    PT LONG TERM GOAL #1   Title upon discharge pt will be I with all HEP given throughout therapy (09/05/2015)   Time 8   Period Weeks   Status On-going   PT LONG TERM GOAL #2   Title pt will increase bil hip/knee strength to > 4/5 and ankle strenght to >4-/5 to help with safety during walking and prolonged standing with LRAD (09/05/2015)   Time 8   Period Weeks   Status On-going   PT LONG TERM GOAL #3   Title she will be able to perform walking/ standing for > 15 minutes with LRAD without stopping to rest to  increase walking endurance and safety. (09/05/2015)   Baseline she could not say how long.     Time 8   Period Weeks   Status On-going   PT LONG TERM GOAL #4   Title she will increase her 10 MWT by > 10 seconds to help progress towards community ambulation to help with personal goal of being able to go shopping (09/05/2015)   Baseline able to go shopping in [place].  10 MWT not assessed   Time 8   Period Weeks   Status Partially Met   PT LONG TERM GOAL #5   Title She will increase her FOTO score to 35 to indicate improved functional capacity upon discharge (09/05/2015)   Status Unable to assess               Plan - 09/18/15 1322    Clinical Impression Statement Patient needed some convincing to try using her cane as the next step of gait progression.  Multiple cues required.  She will improve with practice.  Patient tolerated whole session standing except for 10 minutes on Nustep.   PT Next Visit Plan hip/knee strengthening/ walking, stair training, continue toward goals,  Cane   PT Home Exercise Plan assisted gait with cane at home   Consulted and Agree with Plan of Care Patient;Family member/caregiver   Family Member Consulted daughter        Problem List Patient Active Problem List   Diagnosis Date Noted  . Bilateral lower extremity edema 08/15/2015  . Skin thinning 07/23/2015  . S/P lumbar spine operation 07/14/2015  . Deep vein thrombosis ([place]) 06/30/2015  . Reduced mobility 06/19/2015  . Degenerative spondylolisthesis 06/17/2015  . SPL (spondylolisthesis) 06/12/2015  . CN (constipation) 06/04/2015  . Arteriosclerosis of coronary artery 06/04/2015  . Diastolic dysfunction with heart failure ([place]) 06/04/2015  . History of digestive disease 06/04/2015  . HLD (hyperlipidemia) 06/04/2015  . BP (high blood pressure) 06/04/2015  . PONV (postoperative nausea and vomiting) 06/04/2015  . OP (osteoporosis) 06/04/2015  . Lumbar canal stenosis 04/07/2015  . CAD (coronary  artery disease) 03/03/2012  . HTN (hypertension) 03/03/2012  . Hyperlipidemia 03/03/2012  . GERD (gastroesophageal reflux disease) 03/03/2012    , 09/18/2015, 1:27 PM  [facility] [address] [place], [place], [address]  Phone: [phone]   Fax:  [phone]     Melvenia Needles, PTA 09/18/2015 1:27 PM Phone: [phone] Fax: [phone]

## 2015-09-23 ENCOUNTER — Ambulatory Visit: Payer: Medicare Other | Admitting: Physical Therapy

## 2015-09-23 DIAGNOSIS — Z9181 History of falling: Secondary | ICD-10-CM | POA: Diagnosis not present

## 2015-09-23 DIAGNOSIS — R293 Abnormal posture: Secondary | ICD-10-CM | POA: Diagnosis not present

## 2015-09-23 DIAGNOSIS — R2681 Unsteadiness on feet: Secondary | ICD-10-CM | POA: Diagnosis not present

## 2015-09-23 DIAGNOSIS — R29898 Other symptoms and signs involving the musculoskeletal system: Secondary | ICD-10-CM

## 2015-09-23 NOTE — Therapy (Signed)
[place], [place], [address] Phone: [phone]   Fax:  [phone]  Physical Therapy Treatment  Patient Details  Name: Molly [name] MRN: [id number] Date of Birth: January 15, 1929 Referring Provider:  [name], MD  Encounter Date: 09/23/2015      PT End of Session - 09/23/15 1144    Visit Number 16   Number of Visits 18   Date for PT Re-Evaluation 10/10/15   Authorization Type Medicare KX modifier by the 15th visit, threshold near visit 28   PT Start Time 1015   PT Stop Time 1057   PT Time Calculation (min) 42 min   Equipment Utilized During Treatment Gait belt   Activity Tolerance Patient tolerated treatment well;No increased pain   Behavior During Therapy [facility] for tasks assessed/performed      Past Medical History  Diagnosis Date  . CAD (coronary artery disease)   . Spinal stenosis   . Acid reflux   . Arthritis   . Osteoporosis   . Hyperlipidemia   . High cholesterol   . Hypertension   . Anemia     Past Surgical History  Procedure Laterality Date  . Carotid stent    . Abdominal hysterectomy      There were no vitals filed for this visit.  Visit Diagnosis:  Weakness of both legs  Unsteadiness  Risk for falls  Abnormal posture      Subjective Assessment - 09/23/15 1023    Subjective Having a little bit of a backache today; may be due to the weather and temperature dropping.   Patient Stated Goals to be able to walk, to go shopping and get out of the house.    Currently in Pain? Yes   Pain Score 5    Pain Location Back   Pain Orientation Mid;Lower   Pain Descriptors / Indicators Aching   Pain Onset More than a month ago   Pain Frequency Constant                         [facility] Adult PT Treatment/Exercise - 09/23/15 1024    Ambulation/Gait   Ambulation/Gait Yes   Ambulation/Gait Assistance 4: Min guard;5: Supervision  minguard initially  progressing to close supervision   Ambulation Distance (Feet) 75 Feet  total; seated rest break between   Assistive device Straight cane   Gait Pattern Step-to pattern;Decreased step length - right;Decreased step length - left;Antalgic;Trunk flexed;Narrow base of support;Poor foot clearance - right   Gait Comments initially needing cues for sequencing with step to pattern progressing to step through pattern; attempted to trial AFO however unable to fit in shoe.   Knee/Hip Exercises: Aerobic   Nustep 10 minutes L8   Knee/Hip Exercises: Standing   Heel Raises Both;20 reps   Hip Flexion Both;15 reps   Hip Flexion Limitations marching   Hip Abduction Both;15 reps   Hip Extension Both;15 reps                  PT Short Term Goals - 09/05/15 1131    PT SHORT TERM GOAL #1   Title pt will be I with basic HEP (08/11/2015)   Baseline needs cues   Time 4   Period Weeks   Status Achieved   PT SHORT TERM GOAL #2   Title She will increase bil hip/knee strength to > 4-/5 to assist with walking and standing strength to promote (08/11/2015)   Time 4  Period Weeks   Status Achieved   PT SHORT TERM GOAL #3   Title pt will be able to stand/walking for > 10 minutes with LRAD for safety to increase walking/ standing endurance (08/11/2015)   Baseline can now walk 2 blocks.  Not sure how long it took.   Time 4   Period Weeks   Status Achieved   PT SHORT TERM GOAL #4   Title She will be able to verbalize and demonstrates techniques to protect the spine by log rolling/transitions, postural awarenss, proper DME use, and HEP (08/11/2015)   Baseline needed reminding of hand placement in clinic 1 X today   Time 4   Period Weeks   Status Achieved   PT SHORT TERM GOAL #5   Title She will increase her FOTO score by > 5 points to help with functional progression (08/11/2015)   Time 4   Period Weeks   Status Achieved           PT Long Term Goals - 09/16/15 1327    PT LONG TERM GOAL #1   Title  upon discharge pt will be I with all HEP given throughout therapy (09/05/2015)   Time 8   Period Weeks   Status On-going   PT LONG TERM GOAL #2   Title pt will increase bil hip/knee strength to > 4/5 and ankle strenght to >4-/5 to help with safety during walking and prolonged standing with LRAD (09/05/2015)   Time 8   Period Weeks   Status On-going   PT LONG TERM GOAL #3   Title she will be able to perform walking/ standing for > 15 minutes with LRAD without stopping to rest to increase walking endurance and safety. (09/05/2015)   Baseline she could not say how long.     Time 8   Period Weeks   Status On-going   PT LONG TERM GOAL #4   Title she will increase her 10 MWT by > 10 seconds to help progress towards community ambulation to help with personal goal of being able to go shopping (09/05/2015)   Baseline able to go shopping in [place].  10 MWT not assessed   Time 8   Period Weeks   Status Partially Met   PT LONG TERM GOAL #5   Title She will increase her FOTO score to 35 to indicate improved functional capacity upon discharge (09/05/2015)   Status Unable to assess               Plan - 09/23/15 1145    Clinical Impression Statement Pt improving with SPC with increasing speed as pt increased distance.  May benefit from AFO to help with R foot clearance.  Will continue to benefit from PT to maximize function.   PT Next Visit Plan hip/knee strengthening/ walking, stair training, continue toward goals,  Cane and AFO   PT Home Exercise Plan assisted gait with cane at home   Consulted and Agree with Plan of Care Patient;Family member/caregiver        Problem List Patient Active Problem List   Diagnosis Date Noted  . Bilateral lower extremity edema 08/15/2015  . Skin thinning 07/23/2015  . S/P lumbar spine operation 07/14/2015  . Deep vein thrombosis ([place]) 06/30/2015  . Reduced mobility 06/19/2015  . Degenerative spondylolisthesis 06/17/2015  . SPL (spondylolisthesis)  06/12/2015  . CN (constipation) 06/04/2015  . Arteriosclerosis of coronary artery 06/04/2015  . Diastolic dysfunction with heart failure ([place]) 06/04/2015  . History of digestive  disease 06/04/2015  . HLD (hyperlipidemia) 06/04/2015  . BP (high blood pressure) 06/04/2015  . PONV (postoperative nausea and vomiting) 06/04/2015  . OP (osteoporosis) 06/04/2015  . Lumbar canal stenosis 04/07/2015  . CAD (coronary artery disease) 03/03/2012  . HTN (hypertension) 03/03/2012  . Hyperlipidemia 03/03/2012  . GERD (gastroesophageal reflux disease) 03/03/2012   Laureen Abrahams, PT, DPT 09/23/2015 11:48 AM  [place], [place], [address] Phone: [phone]   Fax:  [phone]

## 2015-09-25 ENCOUNTER — Ambulatory Visit: Payer: Medicare Other | Admitting: Physical Therapy

## 2015-09-25 DIAGNOSIS — R293 Abnormal posture: Secondary | ICD-10-CM

## 2015-09-25 DIAGNOSIS — R29898 Other symptoms and signs involving the musculoskeletal system: Secondary | ICD-10-CM | POA: Diagnosis not present

## 2015-09-25 DIAGNOSIS — Z9181 History of falling: Secondary | ICD-10-CM

## 2015-09-25 DIAGNOSIS — R2681 Unsteadiness on feet: Secondary | ICD-10-CM

## 2015-09-25 NOTE — Therapy (Signed)
[place], [place], [address] Phone: [phone]   Fax:  [phone]  Physical Therapy Treatment  Patient Details  Name: Molly [name] MRN: [id number] Date of Birth: [birth year] Referring Provider:  [name], MD  Encounter Date: 09/25/2015      PT End of Session - 09/25/15 1221    Visit Number 17   Number of Visits 18   Date for PT Re-Evaluation 10/10/15   Authorization Type Medicare KX modifier by the 15th visit, threshold near visit 28   PT Start Time 1100   PT Stop Time 1145   PT Time Calculation (min) 45 min   Equipment Utilized During Treatment Gait belt   Activity Tolerance Patient tolerated treatment well   Behavior During Therapy [facility] for tasks assessed/performed      Past Medical History  Diagnosis Date  . CAD (coronary artery disease)   . Spinal stenosis   . Acid reflux   . Arthritis   . Osteoporosis   . Hyperlipidemia   . High cholesterol   . Hypertension   . Anemia     Past Surgical History  Procedure Laterality Date  . Carotid stent    . Abdominal hysterectomy      There were no vitals filed for this visit.  Visit Diagnosis:  Weakness of both legs  Unsteadiness  Risk for falls  Abnormal posture      Subjective Assessment - 09/25/15 1118    Subjective " I have been having some pain in my hip and low back but it is better today than it has been"   Currently in Pain? Yes   Pain Score 5    Pain Location Back   Pain Orientation Mid;Lower   Pain Descriptors / Indicators Aching            [facility] PT Assessment - 09/25/15 0001    Ambulation/Gait   Stairs Yes   Stair Management Technique Two rails;Alternating pattern;With cane   Number of Stairs 18   Height of Stairs 6                     [facility] Adult PT Treatment/Exercise - 09/25/15 0001    Self-Care   Other Self-Care Comments  discussed practicing with [place] at home more with her  cargivers or daughters with her to help practice and build confidence.    Knee/Hip Exercises: Aerobic   Nustep L 8 x 5 min, L9 x 5 min   Knee/Hip Exercises: Standing   Heel Raises Both;20 reps   Hip Flexion Both;15 reps   Hip Flexion Limitations marching   Hip Abduction Both;15 reps   Hip Extension Both;15 reps   Gait Training walking with SPC with theraband wrapped around gait belt and under the toes to help pull the foot up 150  VC to stand up straight, and use SPC on L side                 PT Education - 09/25/15 1220    Education provided Yes   Education Details gait training with cane   Person(s) Educated Patient   Methods Explanation   Comprehension Verbalized understanding          PT Short Term Goals - 09/05/15 1131    PT SHORT TERM GOAL #1   Title pt will be I with basic HEP (08/11/2015)   Baseline needs cues   Time 4   Period Weeks   Status Achieved  PT SHORT TERM GOAL #2   Title She will increase bil hip/knee strength to > 4-/5 to assist with walking and standing strength to promote (08/11/2015)   Time 4   Period Weeks   Status Achieved   PT SHORT TERM GOAL #3   Title pt will be able to stand/walking for > 10 minutes with LRAD for safety to increase walking/ standing endurance (08/11/2015)   Baseline can now walk 2 blocks.  Not sure how long it took.   Time 4   Period Weeks   Status Achieved   PT SHORT TERM GOAL #4   Title She will be able to verbalize and demonstrates techniques to protect the spine by log rolling/transitions, postural awarenss, proper DME use, and HEP (08/11/2015)   Baseline needed reminding of hand placement in clinic 1 X today   Time 4   Period Weeks   Status Achieved   PT SHORT TERM GOAL #5   Title She will increase her FOTO score by > 5 points to help with functional progression (08/11/2015)   Time 4   Period Weeks   Status Achieved           PT Long Term Goals - 09/16/15 1327    PT LONG TERM GOAL #1   Title upon  discharge pt will be I with all HEP given throughout therapy (09/05/2015)   Time 8   Period Weeks   Status On-going   PT LONG TERM GOAL #2   Title pt will increase bil hip/knee strength to > 4/5 and ankle strenght to >4-/5 to help with safety during walking and prolonged standing with LRAD (09/05/2015)   Time 8   Period Weeks   Status On-going   PT LONG TERM GOAL #3   Title she will be able to perform walking/ standing for > 15 minutes with LRAD without stopping to rest to increase walking endurance and safety. (09/05/2015)   Baseline she could not say how long.     Time 8   Period Weeks   Status On-going   PT LONG TERM GOAL #4   Title she will increase her 10 MWT by > 10 seconds to help progress towards community ambulation to help with personal goal of being able to go shopping (09/05/2015)   Baseline able to go shopping in [place].  10 MWT not assessed   Time 8   Period Weeks   Status Partially Met   PT LONG TERM GOAL #5   Title She will increase her FOTO score to 35 to indicate improved functional capacity upon discharge (09/05/2015)   Status Unable to assess               Plan - 09/25/15 1221    Clinical Impression Statement Molly [name] reports that she is having some pain in the hip and low back which could be due to muscle soreness. Focused todays sesson on gait training with theraband wrapped around the gait belt and udner her toes to help pull the toes up and she was able to amb  160 ft and navigate up/down 18 steps with SPC without complaint except for feeling "wobbly"    PT Next Visit Plan hip/knee strengthening/ walking, stair training,  Cane and AFO, assess goals, D/C? , educate on group class   PT Home Exercise Plan assisted gait with cane at home   Consulted and Agree with Plan of Care Patient        Problem List Patient Active Problem List  Diagnosis Date Noted  . Bilateral lower extremity edema 08/15/2015  . Skin thinning 07/23/2015  . S/P lumbar spine  operation 07/14/2015  . Deep vein thrombosis ([place]) 06/30/2015  . Reduced mobility 06/19/2015  . Degenerative spondylolisthesis 06/17/2015  . SPL (spondylolisthesis) 06/12/2015  . CN (constipation) 06/04/2015  . Arteriosclerosis of coronary artery 06/04/2015  . Diastolic dysfunction with heart failure ([place]) 06/04/2015  . History of digestive disease 06/04/2015  . HLD (hyperlipidemia) 06/04/2015  . BP (high blood pressure) 06/04/2015  . PONV (postoperative nausea and vomiting) 06/04/2015  . OP (osteoporosis) 06/04/2015  . Lumbar canal stenosis 04/07/2015  . CAD (coronary artery disease) 03/03/2012  . HTN (hypertension) 03/03/2012  . Hyperlipidemia 03/03/2012  . GERD (gastroesophageal reflux disease) 03/03/2012   [place]arr [place] PT, DPT, LAT, ATC  09/25/2015  12:27 PM      [facility] [address] [place], [place], [address] Phone: [phone]   Fax:  [phone]

## 2015-09-30 ENCOUNTER — Ambulatory Visit: Payer: Medicare Other | Admitting: Physical Therapy

## 2015-09-30 DIAGNOSIS — M79651 Pain in right thigh: Secondary | ICD-10-CM | POA: Diagnosis not present

## 2015-09-30 DIAGNOSIS — R1031 Right lower quadrant pain: Secondary | ICD-10-CM | POA: Diagnosis not present

## 2015-09-30 DIAGNOSIS — M545 Low back pain: Secondary | ICD-10-CM | POA: Diagnosis not present

## 2015-09-30 DIAGNOSIS — Z9889 Other specified postprocedural states: Secondary | ICD-10-CM | POA: Diagnosis not present

## 2015-09-30 DIAGNOSIS — M25551 Pain in right hip: Secondary | ICD-10-CM | POA: Diagnosis not present

## 2015-09-30 DIAGNOSIS — Z981 Arthrodesis status: Secondary | ICD-10-CM | POA: Diagnosis not present

## 2015-10-01 ENCOUNTER — Other Ambulatory Visit: Payer: Self-pay | Admitting: Interventional Cardiology

## 2015-10-02 ENCOUNTER — Ambulatory Visit: Payer: Medicare Other | Admitting: Physical Therapy

## 2015-10-02 DIAGNOSIS — R293 Abnormal posture: Secondary | ICD-10-CM

## 2015-10-02 DIAGNOSIS — R29898 Other symptoms and signs involving the musculoskeletal system: Secondary | ICD-10-CM

## 2015-10-02 DIAGNOSIS — R2681 Unsteadiness on feet: Secondary | ICD-10-CM

## 2015-10-02 DIAGNOSIS — Z9181 History of falling: Secondary | ICD-10-CM

## 2015-10-02 NOTE — Therapy (Addendum)
[place], [place], [address] Phone: [phone]   Fax:  [phone]  Physical Therapy Treatment / discharge note  Patient Details  Name: Cordia Miklos MRN: [id number] Date of Birth: [birth year] No Data Recorded  Encounter Date: 10/02/2015      PT End of Session - 10/02/15 1611    Visit Number 18   Number of Visits 18   Date for PT Re-Evaluation 10/10/15   Authorization Type Medicare KX modifier by the 15th visit, threshold near visit 28   PT Start Time 1415   PT Stop Time 1500   PT Time Calculation (min) 45 min   Activity Tolerance Patient tolerated treatment well   Behavior During Therapy [facility] for tasks assessed/performed      Past Medical History  Diagnosis Date  . CAD (coronary artery disease)   . Spinal stenosis   . Acid reflux   . Arthritis   . Osteoporosis   . Hyperlipidemia   . High cholesterol   . Hypertension   . Anemia     Past Surgical History  Procedure Laterality Date  . Carotid stent    . Abdominal hysterectomy      There were no vitals filed for this visit.  Visit Diagnosis:  Weakness of both legs  Unsteadiness  Risk for falls  Abnormal posture      Subjective Assessment - 10/02/15 1607    Subjective pt's daughter reports that they returned from the dr and they wanted her to take a 2 week break from pt due to pain the back starting back with a small subluxation noted on a recent X-ray of the spine.    Currently in Pain? Yes   Pain Score 5    Pain Location Back   Pain Orientation Mid;Lower   Pain Type Surgical pain   Pain Onset More than a month ago   Pain Frequency Constant   Aggravating Factors  getting in and out of bed, standing for long periods of time   Pain Relieving Factors N/A                         [facility] Adult PT Treatment/Exercise - 10/02/15 0001    Self-Care   Other Self-Care Comments  educated about AFOs and attempted to  fit AFO. Discussed benefits and that there were AFOs that could be custom fitted and would call and see what options there were.  continue with seated/ supine exercises to maintain strength during 2 weeks off, and to walk as tolerated.    Knee/Hip Exercises: Aerobic   Nustep L 8 x 8 min   Knee/Hip Exercises: Standing   Gait Training attempted walking with toe-off AFO but pt reported it dug into her skin and was uncomfortable.                PT Education - 10/02/15 1611    Education provided Yes   Education Details AFO education   Person(s) Educated Patient   Methods Explanation   Comprehension Verbalized understanding          PT Short Term Goals - 09/05/15 1131    PT SHORT TERM GOAL #1   Title pt will be I with basic HEP (08/11/2015)   Baseline needs cues   Time 4   Period Weeks   Status Achieved   PT SHORT TERM GOAL #2   Title She will increase bil hip/knee strength to > 4-/5 to assist  with walking and standing strength to promote (08/11/2015)   Time 4   Period Weeks   Status Achieved   PT SHORT TERM GOAL #3   Title pt will be able to stand/walking for > 10 minutes with LRAD for safety to increase walking/ standing endurance (08/11/2015)   Baseline can now walk 2 blocks.  Not sure how long it took.   Time 4   Period Weeks   Status Achieved   PT SHORT TERM GOAL #4   Title She will be able to verbalize and demonstrates techniques to protect the spine by log rolling/transitions, postural awarenss, proper DME use, and HEP (08/11/2015)   Baseline needed reminding of hand placement in clinic 1 X today   Time 4   Period Weeks   Status Achieved   PT SHORT TERM GOAL #5   Title She will increase her FOTO score by > 5 points to help with functional progression (08/11/2015)   Time 4   Period Weeks   Status Achieved           PT Long Term Goals - 10/02/15 1615    PT LONG TERM GOAL #1   Title upon discharge pt will be I with all HEP given throughout therapy (09/05/2015)    Time 8   Period Weeks   Status On-going   PT LONG TERM GOAL #2   Title pt will increase bil hip/knee strength to > 4/5 and ankle strenght to >4-/5 to help with safety during walking and prolonged standing with LRAD (09/05/2015)   Time 8   Period Weeks   Status On-going   PT LONG TERM GOAL #3   Title she will be able to perform walking/ standing for > 15 minutes with LRAD without stopping to rest to increase walking endurance and safety. (09/05/2015)   Baseline she could not say how long.     Time 8   Period Weeks   Status On-going   PT LONG TERM GOAL #4   Title she will increase her 10 MWT by > 10 seconds to help progress towards community ambulation to help with personal goal of being able to go shopping (09/05/2015)   Baseline able to go shopping in [place].  10 MWT not assessed   Time 8   Period Weeks   Status Partially Met   PT LONG TERM GOAL #5   Title She will increase her FOTO score to 35 to indicate improved functional capacity upon discharge (09/05/2015)   Time 8   Period Weeks   Status Unable to assess               Plan - 10/02/15 1611    Clinical Impression Statement Evelin states that she is having more pain in the R posterior hip and low back. Her daughter reports the doctor wanted her to halt PT for the next 2 weeks due to a small subluxation noted on her x-ray in the lumbar spine. Attempted to fit AFO and tried walking with her Rollator and she reported it dug into her leg and it was uncomfortable. Attempted to provide extra cushion but pt reported no relief. she may benefit form a custom AFO to accomodate for edema if possible. pt plans on returning in 2 weeks if cleared by her physician.    PT Next Visit Plan hip/knee strengthening/ walking, stair training,  Cane and AFO, assess goals, D/C? , educate on group class, ERO   PT Home Exercise Plan continue with seated/supine exercise  Consulted and Agree with Plan of Care Patient        Problem  List Patient Active Problem List   Diagnosis Date Noted  . Bilateral lower extremity edema 08/15/2015  . Skin thinning 07/23/2015  . S/P lumbar spine operation 07/14/2015  . Deep vein thrombosis ([place]) 06/30/2015  . Reduced mobility 06/19/2015  . Degenerative spondylolisthesis 06/17/2015  . SPL (spondylolisthesis) 06/12/2015  . CN (constipation) 06/04/2015  . Arteriosclerosis of coronary artery 06/04/2015  . Diastolic dysfunction with heart failure ([place]) 06/04/2015  . History of digestive disease 06/04/2015  . HLD (hyperlipidemia) 06/04/2015  . BP (high blood pressure) 06/04/2015  . PONV (postoperative nausea and vomiting) 06/04/2015  . OP (osteoporosis) 06/04/2015  . Lumbar canal stenosis 04/07/2015  . CAD (coronary artery disease) 03/03/2012  . HTN (hypertension) 03/03/2012  . Hyperlipidemia 03/03/2012  . GERD (gastroesophageal reflux disease) 03/03/2012   Starr Lake PT, DPT, LAT, ATC  10/02/2015  4:19 PM      [facility] [address] [place], [place], [address] Phone: [phone]   Fax:  [phone]  Name: Mysha Peeler MRN: [id number] Date of Birth: 1929/06/02    PHYSICAL THERAPY DISCHARGE SUMMARY  Visits from Start of Care: 18  Current functional level related to goals / functional outcomes: See goals   Remaining deficits: Unknown due to not returning since last attended visit.    Education / Equipment: HEP, posture education,  theraband for strengthening.  Plan:                                                    Patient goals were not met. Patient is being discharged due to not returning since the last visit.  ?????       Bethaney Oshana PT, DPT, LAT, ATC  12/03/2015  1:10 PM

## 2015-10-06 ENCOUNTER — Encounter: Payer: Medicare Other | Admitting: Physical Therapy

## 2015-10-08 ENCOUNTER — Encounter: Payer: Medicare Other | Admitting: Physical Therapy

## 2015-10-13 ENCOUNTER — Encounter: Payer: Medicare Other | Admitting: Physical Therapy

## 2015-10-15 ENCOUNTER — Encounter: Payer: Medicare Other | Admitting: Physical Therapy

## 2015-11-14 ENCOUNTER — Encounter: Payer: Self-pay | Admitting: Cardiology

## 2015-11-14 ENCOUNTER — Ambulatory Visit (INDEPENDENT_AMBULATORY_CARE_PROVIDER_SITE_OTHER): Payer: Medicare Other | Admitting: Cardiology

## 2015-11-14 ENCOUNTER — Telehealth: Payer: Self-pay | Admitting: Interventional Cardiology

## 2015-11-14 VITALS — BP 115/68 | HR 67 | Ht 62.0 in | Wt 154.1 lb

## 2015-11-14 DIAGNOSIS — E785 Hyperlipidemia, unspecified: Secondary | ICD-10-CM

## 2015-11-14 DIAGNOSIS — I5032 Chronic diastolic (congestive) heart failure: Secondary | ICD-10-CM

## 2015-11-14 DIAGNOSIS — Z86718 Personal history of other venous thrombosis and embolism: Secondary | ICD-10-CM | POA: Diagnosis not present

## 2015-11-14 DIAGNOSIS — I2510 Atherosclerotic heart disease of native coronary artery without angina pectoris: Secondary | ICD-10-CM | POA: Diagnosis not present

## 2015-11-14 DIAGNOSIS — I10 Essential (primary) hypertension: Secondary | ICD-10-CM | POA: Diagnosis not present

## 2015-11-14 DIAGNOSIS — I200 Unstable angina: Secondary | ICD-10-CM

## 2015-11-14 LAB — CBC WITH DIFFERENTIAL/PLATELET
Basophils Absolute: 0 10*3/uL (ref 0.0–0.1)
Basophils Relative: 0 % (ref 0–1)
EOS ABS: 0.2 10*3/uL (ref 0.0–0.7)
Eosinophils Relative: 3 % (ref 0–5)
HCT: 35.3 % — ABNORMAL LOW (ref 36.0–46.0)
HEMOGLOBIN: 11.2 g/dL — AB (ref 12.0–15.0)
LYMPHS ABS: 2 10*3/uL (ref 0.7–4.0)
Lymphocytes Relative: 37 % (ref 12–46)
MCH: 29.3 pg (ref 26.0–34.0)
MCHC: 31.7 g/dL (ref 30.0–36.0)
MCV: 92.4 fL (ref 78.0–100.0)
MONO ABS: 0.8 10*3/uL (ref 0.1–1.0)
MONOS PCT: 14 % — AB (ref 3–12)
MPV: 9.2 fL (ref 8.6–12.4)
NEUTROS PCT: 46 % (ref 43–77)
Neutro Abs: 2.5 10*3/uL (ref 1.7–7.7)
PLATELETS: 227 10*3/uL (ref 150–400)
RBC: 3.82 MIL/uL — ABNORMAL LOW (ref 3.87–5.11)
RDW: 15.5 % (ref 11.5–15.5)
WBC: 5.4 10*3/uL (ref 4.0–10.5)

## 2015-11-14 LAB — BASIC METABOLIC PANEL
BUN: 18 mg/dL (ref 7–25)
CALCIUM: 9.6 mg/dL (ref 8.6–10.4)
CHLORIDE: 103 mmol/L (ref 98–110)
CO2: 27 mmol/L (ref 20–31)
CREATININE: 0.82 mg/dL (ref 0.60–0.88)
GLUCOSE: 92 mg/dL (ref 65–99)
Potassium: 4.1 mmol/L (ref 3.5–5.3)
Sodium: 139 mmol/L (ref 135–146)

## 2015-11-14 MED ORDER — ISOSORBIDE MONONITRATE ER 60 MG PO TB24: 90.0000 mg | ORAL_TABLET | Freq: Every day | ORAL | Status: DC

## 2015-11-14 NOTE — Progress Notes (Signed)
Cardiology Office Note   Date:  11/14/2015   ID:  Molly [name], DOB [birth year], MRN [id number]  PCP:  Osborne Casco, MD  Cardiologist:  Dr.  Irish Lack   Chief Complaint  Patient presents with  . Chest Pain      History of Present Illness: Molly [name] is a 81 y.o. female who presents for chest pain.  She has a hx. of CAD, with prior BMS to RCA. In 2007.  Last cath in 2008 with minimal in stent restenosis -single vessel disease.    She had a mild abnormality on her stress test in 2012. She has been managed medically.  Last nuc 2013 no ischemia and EF 75%.    Prior to her stent, she had a chest pain that would not resolve.  She has been placed on ranexa which has helped her pain as well.  She has had back surgery several months ago. It was very complicated. She had a DVT and then was treated with heparin. There was apparent postoperative bleeding. She subsequent had a IVC filter placed. She is not a candidate for anticoagulation at this time. She did recover fairly well after the second surgery. She had some right foot drop postoperatively. Her pain is much better. During all that time, she had no chest discomfort or any cardiac issues.  Today she called  In because of continued episodes of chest pain for last 3-4 days.  She felt great on Monday and went shopping.  But over last 3-4 days she has Lt chest pressure that radiates to Lt axilla.  Lasts 10-15 min or so.  She has taken lots of Tums without relief.  She did have nausea today with her pain.  She also complains of fatigue sometimes with just fixing BK she is exhausted.  She is very concerned something is going on with her heart.      Past Medical History  Diagnosis Date  . CAD (coronary artery disease)   . Spinal stenosis   . Acid reflux   . Arthritis   . Osteoporosis   . Hyperlipidemia   . High cholesterol   . Hypertension   . Anemia     Past Surgical History  Procedure Laterality Date  . Carotid  stent    . Abdominal hysterectomy       Current Outpatient Prescriptions  Medication Sig Dispense Refill  . acetaminophen (TYLENOL) 500 MG tablet Take 500 mg by mouth every 8 (eight) hours as needed.     Marland Kitchen aspirin EC 81 MG tablet Take 81 mg by mouth every morning.    . denosumab (PROLIA) 60 MG/ML SOLN injection Inject 60 mg into the skin every 6 (six) months. Administer in upper arm, thigh, or abdomen    . docusate sodium (COLACE) 100 MG capsule Take by mouth 3 (three) times daily.     Marland Kitchen esomeprazole (NEXIUM) 40 MG capsule Take 40 mg by mouth daily. Takes 1 time in the morning, and take another at night if needed.    . gabapentin (NEURONTIN) 100 MG capsule Take 100 mg by mouth 2 (two) times daily.     . Glucosamine-Chondroitin (OSTEO BI-FLEX REGULAR STRENGTH PO) Take 1 capsule by mouth 2 (two) times daily.    . isosorbide mononitrate (IMDUR) 60 MG 24 hr tablet TAKE 1 TABLET DAILY 90 tablet 2  . metoprolol succinate (TOPROL-XL) 25 MG 24 hr tablet TAKE 1 TABLET DAILY 90 tablet 1  . Misc Natural Products (NARCOSOFT HERBAL LAX PO) Take  by mouth.    . nitroGLYCERIN (NITROSTAT) 0.4 MG SL tablet Place 0.4 mg under the tongue every 5 (five) minutes as needed for chest pain (x 3 pills).     Marland Kitchen omega-3 acid ethyl esters (LOVAZA) 1 G capsule Take 1 capsule by mouth 2 (two) times daily.    Marland Kitchen OVER THE COUNTER MEDICATION Take 2 tablets by mouth 2 (two) times daily. Herb-Lax    . polysaccharide iron (NIFEREX) 150 MG CAPS capsule Take 150 mg by mouth every morning.    . Probiotic Product (ALIGN) 4 MG CAPS Take 1 tablet by mouth every morning.    . ranolazine (RANEXA) 500 MG 12 hr tablet Take 1 tablet (500 mg total) by mouth 2 (two) times daily. 180 tablet 3  . rosuvastatin (CRESTOR) 20 MG tablet Take 20 mg by mouth at bedtime.    . Turmeric 500 MG CAPS Take by mouth 4 (four) times daily.    . vitamin C (ASCORBIC ACID) 250 MG tablet Take 250 mg by mouth daily.     No current facility-administered  medications for this visit.    Allergies:   Buprenorphine hcl; Codeine; Hydrocodone; Morphine and related; Sulfa antibiotics; Sulfacetamide sodium; Evista; Hydromorphone; Sulfamethoxazole; Sulfamethoxazole-trimethoprim; and Morphine    Social History:  The patient  reports that she has never smoked. She has never used smokeless tobacco. She reports that she does not drink alcohol or use illicit drugs.   Family History:  The patient's family history includes Anemia in her mother; Heart attack in her brother.    ROS:  General:no colds or fevers, some weight changes Skin:no rashes or ulcers HEENT:no blurred vision, no congestion CV:see HPI PUL:see HPI GI:no diarrhea +constipation that can be quite difficult for her-no melena, no indigestion, + pain GU:no hematuria, no dysuria MS:no joint pain, no claudication Neuro:no syncope, no lightheadedness Endo:no diabetes, no thyroid disease  Wt Readings from Last 3 Encounters:  11/14/15 154 lb 1.9 oz (69.908 kg)  08/15/15 159 lb 3.2 oz (72.213 kg)  05/16/15 156 lb 12.8 oz (71.124 kg)     PHYSICAL EXAM: VS:  BP 115/68 mmHg  Pulse 67  Ht 5\' 2"  (1.575 m)  Wt 154 lb 1.9 oz (69.908 kg)  BMI 28.18 kg/m2 , BMI Body mass index is 28.18 kg/(m^2). General:Pleasant affect, NAD Skin:Warm and dry, brisk capillary refill HEENT:normocephalic, sclera clear, mucus membranes moist Neck:supple, no JVD, no bruits  Heart:S1S2 RRR without murmur, gallup, rub or click Lungs:clear without rales, rhonchi, or wheezes [id number], non tender, + BS, do not palpate liver spleen or masses Ext:no lower ext edema, 2+ pedal pulses, 2+ radial pulses Neuro:alert and oriented, MAE, follows commands, + facial symmetry    EKG:  EKG is ordered today. The ekg ordered today demonstrates SR no changes from previous.     Recent Labs: 11/18/2014: ALT 19 05/16/2015: Pro B Natriuretic peptide (BNP) 150.0* 05/28/2015: BUN 16; Creat 1.08; Potassium 3.7; Sodium 140    Lipid  Panel    Component Value Date/Time   CHOL 203* 11/18/2014 1002   TRIG 127.0 11/18/2014 1002   HDL 91.00 11/18/2014 1002   CHOLHDL 2 11/18/2014 1002   VLDL 25.4 11/18/2014 1002   LDLCALC 87 11/18/2014 1002       Other studies Reviewed: Additional studies/ records that were reviewed today include: previous cath notes, nuc studies OV notes. .   ASSESSMENT AND PLAN:  1.  Unstable angina, began 3-4 days ago.  Last cath 8  Years ago.  Discussed with  Dr. Angelena Form and will plan for cardiac cath.  If symptoms increase over the weekend she will go to ER.  In the meantime will increase imdur to 60 mg in AM and 30 mg at hs.  Her daughters [name] in the room and Margorie by phone note with increased imdur she becomes lightheaded and dizzy, they agreed to increase until cath.    Also Margorie would like to discuss with Dr. Irish Lack before Monday.  I explained he was in the cath lab but would send the message.   2. IVC filter with hx DVT and no anticoagulation  3.  CAD with previous stent to RCA.  4. Chronic diastolic HF-euvolemic.      Current medicines are reviewed with the patient today.  The patient Has no concerns regarding medicines.  The following changes have been made:  See above Labs/ tests ordered today include:see above  Disposition:   FU:  see above  Signed, Isaiah Serge, NP  11/14/2015 2:19 PM    [facility] [place], [place], [place], [place] Phone: [phone]; Fax: [phone]

## 2015-11-14 NOTE — Telephone Encounter (Signed)
Spoke with patient and she has been having chest/breast pain radiating to back and left shoulder blade for last several days Patient denies any shortness of breath but did have nausea this am  Has been taking Tums but this does not seem to help, no NTG used  Pain last less than 30 minutes, not worse with movement  Discussed with [name] I NP and she will see patient this afternoon  Patient aware of appointment

## 2015-11-14 NOTE — Telephone Encounter (Signed)
New problem   Pt is having breast pain radiating to her back shoulder blade for 4days. Pt has been taking tums for for days. Please advise.

## 2015-11-14 NOTE — Patient Instructions (Signed)
Medication Instructions:  Your physician has recommended you make the following change in your medication:  1. Increase Imdur, 1 tablet ( 60 mg ) in the am one half tablet ( 30 mg ) in the pm.   Labwork: Your physician recommends that you have lab work today: bmet/cbc/ptt/pt/inr   Testing/Procedures: Your physician has requested that you have a cardiac catheterization. Cardiac catheterization is used to diagnose and/or treat various heart conditions. Doctors may recommend this procedure for a number of different reasons. The most common reason is to evaluate chest pain. Chest pain can be a symptom of coronary artery disease (CAD), and cardiac catheterization can show whether plaque is narrowing or blocking your heart's arteries. This procedure is also used to evaluate the valves, as well as measure the blood flow and oxygen levels in different parts of your heart. For further information please visit [email or web address]. Please follow instruction sheet, as given.    Follow-Up: To be determined following procedure  Any Other Special Instructions Will Be Listed Below (If Applicable).  You are scheduled for a cardiac catheterization on Monday, December 5 with Dr. Irish Lack or associate.  Go to [facility] 2nd Floor Short Stay on Monday, December 5 at 7:00 am.  Enter thru the [place] entrance A No food or drink after midnight on Sunday, December 4. You may take your medications with a sip of water on the day of your procedure You will need someone to drive you home after procedure.     If you need a refill on your cardiac medications before your next appointment, please call your pharmacy.

## 2015-11-15 LAB — PROTIME-INR
INR: 0.98 (ref <1.50)
PROTHROMBIN TIME: 13.1 s (ref 11.6–15.2)

## 2015-11-15 LAB — APTT: APTT: 33 s (ref 24–37)

## 2015-11-17 ENCOUNTER — Encounter (HOSPITAL_COMMUNITY)
Admission: RE | Disposition: A | Discharge status: Home / Self Care | Payer: Medicare Other | Source: Ambulatory Visit | Attending: Interventional Cardiology

## 2015-11-17 ENCOUNTER — Ambulatory Visit (HOSPITAL_COMMUNITY)
Admission: RE | Admit: 2015-11-17 | Discharge: 2015-11-17 | Disposition: A | Discharge status: Home / Self Care | Payer: Medicare Other | Source: Ambulatory Visit | Attending: Interventional Cardiology | Admitting: Interventional Cardiology

## 2015-11-17 ENCOUNTER — Other Ambulatory Visit: Payer: Self-pay | Admitting: Interventional Cardiology

## 2015-11-17 ENCOUNTER — Encounter: Payer: Self-pay | Admitting: Interventional Cardiology

## 2015-11-17 ENCOUNTER — Encounter (HOSPITAL_COMMUNITY): Payer: Self-pay | Admitting: Interventional Cardiology

## 2015-11-17 DIAGNOSIS — I10 Essential (primary) hypertension: Secondary | ICD-10-CM | POA: Insufficient documentation

## 2015-11-17 DIAGNOSIS — Z7982 Long term (current) use of aspirin: Secondary | ICD-10-CM | POA: Diagnosis not present

## 2015-11-17 DIAGNOSIS — I25110 Atherosclerotic heart disease of native coronary artery with unstable angina pectoris: Secondary | ICD-10-CM

## 2015-11-17 DIAGNOSIS — Z79899 Other long term (current) drug therapy: Secondary | ICD-10-CM | POA: Diagnosis not present

## 2015-11-17 DIAGNOSIS — I25119 Atherosclerotic heart disease of native coronary artery with unspecified angina pectoris: Secondary | ICD-10-CM | POA: Insufficient documentation

## 2015-11-17 DIAGNOSIS — M21371 Foot drop, right foot: Secondary | ICD-10-CM | POA: Insufficient documentation

## 2015-11-17 DIAGNOSIS — I5032 Chronic diastolic (congestive) heart failure: Secondary | ICD-10-CM | POA: Diagnosis not present

## 2015-11-17 DIAGNOSIS — R079 Chest pain, unspecified: Secondary | ICD-10-CM | POA: Diagnosis not present

## 2015-11-17 DIAGNOSIS — I200 Unstable angina: Secondary | ICD-10-CM | POA: Insufficient documentation

## 2015-11-17 HISTORY — PX: CARDIAC CATHETERIZATION: SHX172

## 2015-11-17 SURGERY — LEFT HEART CATH AND CORONARY ANGIOGRAPHY
Anesthesia: LOCAL

## 2015-11-17 MED ORDER — SODIUM CHLORIDE 0.9 % IJ SOLN: 3.0000 mL | Freq: Two times a day (BID) | INTRAMUSCULAR | Status: DC

## 2015-11-17 MED ORDER — HEPARIN SODIUM (PORCINE) 1000 UNIT/ML IJ SOLN
INTRAMUSCULAR | Status: DC | PRN
  Administered 2015-11-17: 3500 [IU] via INTRAVENOUS

## 2015-11-17 MED ORDER — SODIUM CHLORIDE 0.9 % IJ SOLN: 3.0000 mL | INTRAMUSCULAR | Status: DC | PRN

## 2015-11-17 MED ORDER — ASPIRIN 81 MG PO CHEW
81.0000 mg | CHEWABLE_TABLET | ORAL | Status: AC
  Administered 2015-11-17: 81 mg via ORAL

## 2015-11-17 MED ORDER — HEPARIN (PORCINE) IN NACL 2-0.9 UNIT/ML-% IJ SOLN
INTRAMUSCULAR | Status: AC
  Filled 2015-11-17: qty 1500

## 2015-11-17 MED ORDER — ASPIRIN 81 MG PO CHEW
CHEWABLE_TABLET | ORAL | Status: AC
  Filled 2015-11-17: qty 1

## 2015-11-17 MED ORDER — HEPARIN SODIUM (PORCINE) 1000 UNIT/ML IJ SOLN
INTRAMUSCULAR | Status: AC
  Filled 2015-11-17: qty 1

## 2015-11-17 MED ORDER — SODIUM CHLORIDE 0.9 % WEIGHT BASED INFUSION: 1.0000 mL/kg/h | INTRAVENOUS | Status: DC

## 2015-11-17 MED ORDER — SODIUM CHLORIDE 0.9 % WEIGHT BASED INFUSION: 1.0000 mL/kg/h | INTRAVENOUS | Status: AC

## 2015-11-17 MED ORDER — SODIUM CHLORIDE 0.9 % WEIGHT BASED INFUSION
3.0000 mL/kg/h | INTRAVENOUS | Status: AC
  Administered 2015-11-17: 3 mL/kg/h via INTRAVENOUS

## 2015-11-17 MED ORDER — VERAPAMIL HCL 2.5 MG/ML IV SOLN
INTRAVENOUS | Status: DC | PRN
  Administered 2015-11-17: 08:00:00 via INTRA_ARTERIAL

## 2015-11-17 MED ORDER — LIDOCAINE HCL (PF) 1 % IJ SOLN
INTRAMUSCULAR | Status: AC
  Filled 2015-11-17: qty 30

## 2015-11-17 MED ORDER — SODIUM CHLORIDE 0.9 % IV SOLN
250.0000 mL | INTRAVENOUS | Status: DC | PRN
Start: 2015-11-17 — End: 2015-11-17

## 2015-11-17 MED ORDER — SODIUM CHLORIDE 0.9 % IV SOLN: 250.0000 mL | INTRAVENOUS | Status: DC | PRN

## 2015-11-17 MED ORDER — IOHEXOL 350 MG/ML SOLN
INTRAVENOUS | Status: DC | PRN
  Administered 2015-11-17: 65 mL via INTRA_ARTERIAL

## 2015-11-17 MED ORDER — VERAPAMIL HCL 2.5 MG/ML IV SOLN
INTRAVENOUS | Status: AC
  Filled 2015-11-17: qty 2

## 2015-11-17 SURGICAL SUPPLY — 12 items

## 2015-11-17 NOTE — H&P (View-Only) (Signed)
Cardiology Office Note   Date:  11/14/2015   ID:  Molly [name], DOB 06/08/1929, MRN [id number]  PCP:  Osborne Casco, MD  Cardiologist:  Dr.  Irish Lack   Chief Complaint  Patient presents with  . Chest Pain      History of Present Illness: Molly [name] is a 81 y.o. female who presents for chest pain.  She has a hx. of CAD, with prior BMS to RCA. In 2007.  Last cath in 2008 with minimal in stent restenosis -single vessel disease.    She had a mild abnormality on her stress test in 2012. She has been managed medically.  Last nuc 2013 no ischemia and EF 75%.    Prior to her stent, she had a chest pain that would not resolve.  She has been placed on ranexa which has helped her pain as well.  She has had back surgery several months ago. It was very complicated. She had a DVT and then was treated with heparin. There was apparent postoperative bleeding. She subsequent had a IVC filter placed. She is not a candidate for anticoagulation at this time. She did recover fairly well after the second surgery. She had some right foot drop postoperatively. Her pain is much better. During all that time, she had no chest discomfort or any cardiac issues.  Today she called  In because of continued episodes of chest pain for last 3-4 days.  She felt great on Monday and went shopping.  But over last 3-4 days she has Lt chest pressure that radiates to Lt axilla.  Lasts 10-15 min or so.  She has taken lots of Tums without relief.  She did have nausea today with her pain.  She also complains of fatigue sometimes with just fixing BK she is exhausted.  She is very concerned something is going on with her heart.      Past Medical History  Diagnosis Date  . CAD (coronary artery disease)   . Spinal stenosis   . Acid reflux   . Arthritis   . Osteoporosis   . Hyperlipidemia   . High cholesterol   . Hypertension   . Anemia     Past Surgical History  Procedure Laterality Date  . Carotid  stent    . Abdominal hysterectomy       Current Outpatient Prescriptions  Medication Sig Dispense Refill  . acetaminophen (TYLENOL) 500 MG tablet Take 500 mg by mouth every 8 (eight) hours as needed.     Marland Kitchen aspirin EC 81 MG tablet Take 81 mg by mouth every morning.    . denosumab (PROLIA) 60 MG/ML SOLN injection Inject 60 mg into the skin every 6 (six) months. Administer in upper arm, thigh, or abdomen    . docusate sodium (COLACE) 100 MG capsule Take by mouth 3 (three) times daily.     Marland Kitchen esomeprazole (NEXIUM) 40 MG capsule Take 40 mg by mouth daily. Takes 1 time in the morning, and take another at night if needed.    . gabapentin (NEURONTIN) 100 MG capsule Take 100 mg by mouth 2 (two) times daily.     . Glucosamine-Chondroitin (OSTEO BI-FLEX REGULAR STRENGTH PO) Take 1 capsule by mouth 2 (two) times daily.    . isosorbide mononitrate (IMDUR) 60 MG 24 hr tablet TAKE 1 TABLET DAILY 90 tablet 2  . metoprolol succinate (TOPROL-XL) 25 MG 24 hr tablet TAKE 1 TABLET DAILY 90 tablet 1  . Misc Natural Products (NARCOSOFT HERBAL LAX PO) Take  by mouth.    . nitroGLYCERIN (NITROSTAT) 0.4 MG SL tablet Place 0.4 mg under the tongue every 5 (five) minutes as needed for chest pain (x 3 pills).     Marland Kitchen omega-3 acid ethyl esters (LOVAZA) 1 G capsule Take 1 capsule by mouth 2 (two) times daily.    Marland Kitchen OVER THE COUNTER MEDICATION Take 2 tablets by mouth 2 (two) times daily. Herb-Lax    . polysaccharide iron (NIFEREX) 150 MG CAPS capsule Take 150 mg by mouth every morning.    . Probiotic Product (ALIGN) 4 MG CAPS Take 1 tablet by mouth every morning.    . ranolazine (RANEXA) 500 MG 12 hr tablet Take 1 tablet (500 mg total) by mouth 2 (two) times daily. 180 tablet 3  . rosuvastatin (CRESTOR) 20 MG tablet Take 20 mg by mouth at bedtime.    . Turmeric 500 MG CAPS Take by mouth 4 (four) times daily.    . vitamin C (ASCORBIC ACID) 250 MG tablet Take 250 mg by mouth daily.     No current facility-administered  medications for this visit.    Allergies:   Buprenorphine hcl; Codeine; Hydrocodone; Morphine and related; Sulfa antibiotics; Sulfacetamide sodium; Evista; Hydromorphone; Sulfamethoxazole; Sulfamethoxazole-trimethoprim; and Morphine    Social History:  The patient  reports that she has never smoked. She has never used smokeless tobacco. She reports that she does not drink alcohol or use illicit drugs.   Family History:  The patient's family history includes Anemia in her mother; Heart attack in her brother.    ROS:  General:no colds or fevers, some weight changes Skin:no rashes or ulcers HEENT:no blurred vision, no congestion CV:see HPI PUL:see HPI GI:no diarrhea +constipation that can be quite difficult for her-no melena, no indigestion, + pain GU:no hematuria, no dysuria MS:no joint pain, no claudication Neuro:no syncope, no lightheadedness Endo:no diabetes, no thyroid disease  Wt Readings from Last 3 Encounters:  11/14/15 154 lb 1.9 oz (69.908 kg)  08/15/15 159 lb 3.2 oz (72.213 kg)  05/16/15 156 lb 12.8 oz (71.124 kg)     PHYSICAL EXAM: VS:  BP 115/68 mmHg  Pulse 67  Ht 5\' 2"  (1.575 m)  Wt 154 lb 1.9 oz (69.908 kg)  BMI 28.18 kg/m2 , BMI Body mass index is 28.18 kg/(m^2). General:Pleasant affect, NAD Skin:Warm and dry, brisk capillary refill HEENT:normocephalic, sclera clear, mucus membranes moist Neck:supple, no JVD, no bruits  Heart:S1S2 RRR without murmur, gallup, rub or click Lungs:clear without rales, rhonchi, or wheezes [id number], non tender, + BS, do not palpate liver spleen or masses Ext:no lower ext edema, 2+ pedal pulses, 2+ radial pulses Neuro:alert and oriented, MAE, follows commands, + facial symmetry    EKG:  EKG is ordered today. The ekg ordered today demonstrates SR no changes from previous.     Recent Labs: 11/18/2014: ALT 19 05/16/2015: Pro B Natriuretic peptide (BNP) 150.0* 05/28/2015: BUN 16; Creat 1.08; Potassium 3.7; Sodium 140    Lipid  Panel    Component Value Date/Time   CHOL 203* 11/18/2014 1002   TRIG 127.0 11/18/2014 1002   HDL 91.00 11/18/2014 1002   CHOLHDL 2 11/18/2014 1002   VLDL 25.4 11/18/2014 1002   LDLCALC 87 11/18/2014 1002       Other studies Reviewed: Additional studies/ records that were reviewed today include: previous cath notes, nuc studies OV notes. .   ASSESSMENT AND PLAN:  1.  Unstable angina, began 3-4 days ago.  Last cath 8  Years ago.  Discussed with  Dr. Angelena Form and will plan for cardiac cath.  If symptoms increase over the weekend she will go to ER.  In the meantime will increase imdur to 60 mg in AM and 30 mg at hs.  Her daughters [name] in the room and Margorie by phone note with increased imdur she becomes lightheaded and dizzy, they agreed to increase until cath.    Also Margorie would like to discuss with Dr. Irish Lack before Monday.  I explained he was in the cath lab but would send the message.   2. IVC filter with hx DVT and no anticoagulation  3.  CAD with previous stent to RCA.  4. Chronic diastolic HF-euvolemic.      Current medicines are reviewed with the patient today.  The patient Has no concerns regarding medicines.  The following changes have been made:  See above Labs/ tests ordered today include:see above  Disposition:   FU:  see above  Signed, Isaiah Serge, NP  11/14/2015 2:19 PM    [facility] [place], [place] [place], [place] Phone: [phone]; Fax: [phone]

## 2015-11-17 NOTE — Discharge Instructions (Signed)
Radial Site Care °Refer to this sheet in the next few weeks. These instructions provide you with information about caring for yourself after your procedure. Your health care provider may also give you more specific instructions. Your treatment has been planned according to current medical practices, but problems sometimes occur. Call your health care provider if you have any problems or questions after your procedure. °WHAT TO EXPECT AFTER THE PROCEDURE °After your procedure, it is typical to have the following: °· Bruising at the radial site that usually fades within 1-2 weeks. °· Blood collecting in the tissue (hematoma) that may be painful to the touch. It should usually decrease in size and tenderness within 1-2 weeks. °HOME CARE INSTRUCTIONS °· Take medicines only as directed by your health care provider. °· You may shower 24-48 hours after the procedure or as directed by your health care provider. Remove the bandage (dressing) and gently wash the site with plain soap and water. Pat the area dry with a clean towel. Do not rub the site, because this may cause bleeding. °· Do not take baths, swim, or use a hot tub until your health care provider approves. °· Check your insertion site every day for redness, swelling, or drainage. °· Do not apply powder or lotion to the site. °· Do not flex or bend the affected arm for 24 hours or as directed by your health care provider. °· Do not push or pull heavy objects with the affected arm for 24 hours or as directed by your health care provider. °· Do not lift over 10 lb (4.5 kg) for 5 days after your procedure or as directed by your health care provider. °· Ask your health care provider when it is okay to: °¨ Return to work or school. °¨ Resume usual physical activities or sports. °¨ Resume sexual activity. °· Do not drive home if you are discharged the same day as the procedure. Have someone else drive you. °· You may drive 24 hours after the procedure unless otherwise  instructed by your health care provider. °· Do not operate machinery or power tools for 24 hours after the procedure. °· If your procedure was done as an outpatient procedure, which means that you went home the same day as your procedure, a responsible adult should be with you for the first 24 hours after you arrive home. °· Keep all follow-up visits as directed by your health care provider. This is important. °SEEK MEDICAL CARE IF: °· You have a fever. °· You have chills. °· You have increased bleeding from the radial site. Hold pressure on the site and call 911. °SEEK IMMEDIATE MEDICAL CARE IF: °· You have unusual pain at the radial site. °· You have redness, warmth, or swelling at the radial site. °· You have drainage (other than a small amount of blood on the dressing) from the radial site. °· The radial site is bleeding, and the bleeding does not stop after 30 minutes of holding steady pressure on the site. °· Your arm or hand becomes pale, cool, tingly, or numb. °  °This information is not intended to replace advice given to you by your health care provider. Make sure you discuss any questions you have with your health care provider. °  °Document Released: 01/01/2011 Document Revised: 12/20/2014 Document Reviewed: 06/17/2014 °Elsevier Interactive Patient Education ©2016 [employer] ° °

## 2015-11-17 NOTE — Interval H&P Note (Signed)
Cath Lab Visit (complete for each Cath Lab visit)  Clinical Evaluation Leading to the Procedure:   ACS: No.  Non-ACS:    Anginal Classification: CCS IV  Anti-ischemic medical therapy: Minimal Therapy (1 class of medications)  Non-Invasive Test Results: No non-invasive testing performed  Prior CABG: No previous CABG   Accelerating angina. SPoke with daughter.  History and Physical Interval Note:  11/17/2015 7:52 AM  Molly [name]  has presented today for surgery, with the diagnosis of unstable angina  The various methods of treatment have been discussed with the patient and family. After consideration of risks, benefits and other options for treatment, the patient has consented to  Procedure(s): Left Heart Cath and Coronary Angiography (N/A) as a surgical intervention .  The patient's history has been reviewed, patient examined, no change in status, stable for surgery.  I have reviewed the patient's chart and labs.  Questions were answered to the patient's satisfaction.     Gurtej Noyola S.

## 2015-11-17 NOTE — Consult Note (Signed)
WOC wound consult note Reason for Consult: skin tear resulted during cardiac catheterization  Wound type: skin tear; adjacent hematoma right forearm  Measurement:5cm in width, linear over the right wrist Wound [id number] blood, air tourniquet in place, skin flap present and able to be reapproximated mostly. Drainage (amount, consistency, odor)  Periwound:intact, thin skin, some bruising over the arm and as above hematoma just proximal to the skin tear Dressing procedure/placement/frequency: Dressing gently removed with adhesive remover, air tourniquet removed as well. Slight oozing at the site, cleaned.  Benzoine applied to the intact skin, 1/4" steristrips used to re-approximate the skin flap, gauze, foam and coban used to apply some pressure to the site.  Instructions: Keep area clean and dry for at least 48 hours. Leave steri strips in place as long as possible, allow them to turn loose on their own. Cover with dry dressing, ok to leave foam in place, however if blood strikes through foam. REmove and change out gauze dressing, leaving steristrips in place.  Ok to remove coban after dinner time tonight.  If steristrips fall off, use Vaseline gauze to cover site and top with dry dressings.  Vaseline gauze provided per the short stay staff.  [place], [place]

## 2015-11-18 ENCOUNTER — Other Ambulatory Visit: Payer: Self-pay | Admitting: *Deleted

## 2015-11-18 NOTE — Telephone Encounter (Signed)
When I tried to order this, there was a note attached that the patient reported that she takes 60mg  qd which is also on the snapshot. Looks like she was to start taking 1.5 tablets qd. Please advise as to how this should be ordered. Thanks, MI

## 2015-11-18 NOTE — Telephone Encounter (Signed)
Please refer to the pts last OV with Cecilie Kicks, NP on 11/14/15. Imdur was adjusted at that apt. Thanks.

## 2015-11-19 MED FILL — Lidocaine HCl Local Preservative Free (PF) Inj 1%: INTRAMUSCULAR | Qty: 30 | Status: AC

## 2015-11-19 MED FILL — Heparin Sodium (Porcine) 2 Unit/ML in Sodium Chloride 0.9%: INTRAMUSCULAR | Qty: 1000 | Status: AC

## 2015-12-05 DIAGNOSIS — M545 Low back pain: Secondary | ICD-10-CM | POA: Diagnosis not present

## 2015-12-05 DIAGNOSIS — S32030D Wedge compression fracture of third lumbar vertebra, subsequent encounter for fracture with routine healing: Secondary | ICD-10-CM | POA: Diagnosis not present

## 2015-12-10 DIAGNOSIS — Z09 Encounter for follow-up examination after completed treatment for conditions other than malignant neoplasm: Secondary | ICD-10-CM | POA: Diagnosis not present

## 2015-12-10 DIAGNOSIS — Z7189 Other specified counseling: Secondary | ICD-10-CM | POA: Diagnosis not present

## 2015-12-10 DIAGNOSIS — Z981 Arthrodesis status: Secondary | ICD-10-CM | POA: Diagnosis not present

## 2015-12-10 DIAGNOSIS — Z4789 Encounter for other orthopedic aftercare: Secondary | ICD-10-CM | POA: Diagnosis not present

## 2015-12-12 DIAGNOSIS — S32030D Wedge compression fracture of third lumbar vertebra, subsequent encounter for fracture with routine healing: Secondary | ICD-10-CM | POA: Diagnosis not present

## 2015-12-12 DIAGNOSIS — M545 Low back pain: Secondary | ICD-10-CM | POA: Diagnosis not present

## 2015-12-19 DIAGNOSIS — M545 Low back pain: Secondary | ICD-10-CM | POA: Diagnosis not present

## 2015-12-19 DIAGNOSIS — S32030D Wedge compression fracture of third lumbar vertebra, subsequent encounter for fracture with routine healing: Secondary | ICD-10-CM | POA: Diagnosis not present

## 2015-12-26 DIAGNOSIS — M545 Low back pain: Secondary | ICD-10-CM | POA: Diagnosis not present

## 2015-12-26 DIAGNOSIS — S32030D Wedge compression fracture of third lumbar vertebra, subsequent encounter for fracture with routine healing: Secondary | ICD-10-CM | POA: Diagnosis not present

## 2015-12-30 DIAGNOSIS — S32030D Wedge compression fracture of third lumbar vertebra, subsequent encounter for fracture with routine healing: Secondary | ICD-10-CM | POA: Diagnosis not present

## 2015-12-30 DIAGNOSIS — M545 Low back pain: Secondary | ICD-10-CM | POA: Diagnosis not present

## 2016-01-02 DIAGNOSIS — S32030D Wedge compression fracture of third lumbar vertebra, subsequent encounter for fracture with routine healing: Secondary | ICD-10-CM | POA: Diagnosis not present

## 2016-01-02 DIAGNOSIS — M545 Low back pain: Secondary | ICD-10-CM | POA: Diagnosis not present

## 2016-01-05 DIAGNOSIS — S32030D Wedge compression fracture of third lumbar vertebra, subsequent encounter for fracture with routine healing: Secondary | ICD-10-CM | POA: Diagnosis not present

## 2016-01-05 DIAGNOSIS — M545 Low back pain: Secondary | ICD-10-CM | POA: Diagnosis not present

## 2016-01-07 DIAGNOSIS — S32030D Wedge compression fracture of third lumbar vertebra, subsequent encounter for fracture with routine healing: Secondary | ICD-10-CM | POA: Diagnosis not present

## 2016-01-07 DIAGNOSIS — M545 Low back pain: Secondary | ICD-10-CM | POA: Diagnosis not present

## 2016-01-09 DIAGNOSIS — Z961 Presence of intraocular lens: Secondary | ICD-10-CM | POA: Diagnosis not present

## 2016-01-09 DIAGNOSIS — H52203 Unspecified astigmatism, bilateral: Secondary | ICD-10-CM | POA: Diagnosis not present

## 2016-01-12 DIAGNOSIS — M545 Low back pain: Secondary | ICD-10-CM | POA: Diagnosis not present

## 2016-01-12 DIAGNOSIS — S32030D Wedge compression fracture of third lumbar vertebra, subsequent encounter for fracture with routine healing: Secondary | ICD-10-CM | POA: Diagnosis not present

## 2016-01-14 DIAGNOSIS — M545 Low back pain: Secondary | ICD-10-CM | POA: Diagnosis not present

## 2016-01-14 DIAGNOSIS — S32030D Wedge compression fracture of third lumbar vertebra, subsequent encounter for fracture with routine healing: Secondary | ICD-10-CM | POA: Diagnosis not present

## 2016-01-19 DIAGNOSIS — M545 Low back pain: Secondary | ICD-10-CM | POA: Diagnosis not present

## 2016-01-19 DIAGNOSIS — S32030D Wedge compression fracture of third lumbar vertebra, subsequent encounter for fracture with routine healing: Secondary | ICD-10-CM | POA: Diagnosis not present

## 2016-01-26 DIAGNOSIS — S32030D Wedge compression fracture of third lumbar vertebra, subsequent encounter for fracture with routine healing: Secondary | ICD-10-CM | POA: Diagnosis not present

## 2016-01-26 DIAGNOSIS — M545 Low back pain: Secondary | ICD-10-CM | POA: Diagnosis not present

## 2016-01-28 DIAGNOSIS — S32030D Wedge compression fracture of third lumbar vertebra, subsequent encounter for fracture with routine healing: Secondary | ICD-10-CM | POA: Diagnosis not present

## 2016-01-28 DIAGNOSIS — M545 Low back pain: Secondary | ICD-10-CM | POA: Diagnosis not present

## 2016-02-03 DIAGNOSIS — D649 Anemia, unspecified: Secondary | ICD-10-CM | POA: Diagnosis not present

## 2016-02-03 DIAGNOSIS — R531 Weakness: Secondary | ICD-10-CM | POA: Diagnosis not present

## 2016-02-03 DIAGNOSIS — R8290 Unspecified abnormal findings in urine: Secondary | ICD-10-CM | POA: Diagnosis not present

## 2016-02-16 DIAGNOSIS — C44519 Basal cell carcinoma of skin of other part of trunk: Secondary | ICD-10-CM | POA: Diagnosis not present

## 2016-02-16 DIAGNOSIS — L820 Inflamed seborrheic keratosis: Secondary | ICD-10-CM | POA: Diagnosis not present

## 2016-02-16 DIAGNOSIS — L821 Other seborrheic keratosis: Secondary | ICD-10-CM | POA: Diagnosis not present

## 2016-02-16 DIAGNOSIS — D485 Neoplasm of uncertain behavior of skin: Secondary | ICD-10-CM | POA: Diagnosis not present

## 2016-02-16 DIAGNOSIS — Z85828 Personal history of other malignant neoplasm of skin: Secondary | ICD-10-CM | POA: Diagnosis not present

## 2016-02-17 ENCOUNTER — Encounter: Payer: Self-pay | Admitting: Podiatry

## 2016-02-17 ENCOUNTER — Ambulatory Visit (INDEPENDENT_AMBULATORY_CARE_PROVIDER_SITE_OTHER): Payer: Medicare Other | Admitting: Podiatry

## 2016-02-17 VITALS — BP 131/67 | HR 59 | Resp 16 | Ht 62.0 in | Wt 150.0 lb

## 2016-02-17 DIAGNOSIS — M779 Enthesopathy, unspecified: Secondary | ICD-10-CM | POA: Diagnosis not present

## 2016-02-17 DIAGNOSIS — M21371 Foot drop, right foot: Secondary | ICD-10-CM | POA: Diagnosis not present

## 2016-02-17 DIAGNOSIS — M2040 Other hammer toe(s) (acquired), unspecified foot: Secondary | ICD-10-CM | POA: Diagnosis not present

## 2016-02-17 MED ORDER — TRIAMCINOLONE ACETONIDE 10 MG/ML IJ SUSP
10.0000 mg | Freq: Once | INTRAMUSCULAR | Status: AC
  Administered 2016-02-17: 10 mg

## 2016-02-17 NOTE — Progress Notes (Signed)
Subjective:     Patient ID: Molly [name], female   DOB: 1929/01/04, 82 y.o.   MRN: [id number]  HPI patient presents with inflammatory changes around the second digit right occipital interphalangeal joint and also digital deformity second right fourth left and left big toe.   Review of Systems     Objective:   Physical Exam Neurovascular status found to be intact muscle strength was diminished with range of motion diminished subtalar midtarsal joint. Patient's found have digital deformity consistent with hammertoe deformity inflammation of the second interphalangeal joint right toe and deformity of the fourth toe left hallux left.    Assessment:     Inflammatory changes consistent with capsulitis and hammertoe deformity    Plan:     H&P x-rays reviewed with patient and at this time I did a careful interphalangeal joint injection right to milligrams [place] some Kenalog 5 mill grams Xylocaine and applied silicone sheeting to the second toe right left big toe left fourth toe. Reappoint to recheck again as needed and may require more aggressive treatment in future

## 2016-03-10 DIAGNOSIS — M40209 Unspecified kyphosis, site unspecified: Secondary | ICD-10-CM | POA: Diagnosis not present

## 2016-03-10 DIAGNOSIS — M4856XA Collapsed vertebra, not elsewhere classified, lumbar region, initial encounter for fracture: Secondary | ICD-10-CM | POA: Diagnosis not present

## 2016-03-10 DIAGNOSIS — Z981 Arthrodesis status: Secondary | ICD-10-CM | POA: Diagnosis not present

## 2016-03-10 DIAGNOSIS — M419 Scoliosis, unspecified: Secondary | ICD-10-CM | POA: Diagnosis not present

## 2016-03-10 DIAGNOSIS — M4856XD Collapsed vertebra, not elsewhere classified, lumbar region, subsequent encounter for fracture with routine healing: Secondary | ICD-10-CM | POA: Diagnosis not present

## 2016-03-15 ENCOUNTER — Ambulatory Visit: Payer: Medicare Other | Admitting: *Deleted

## 2016-03-15 DIAGNOSIS — M21371 Foot drop, right foot: Secondary | ICD-10-CM

## 2016-03-15 NOTE — Progress Notes (Signed)
Patient ID: Molly [name], female   DOB: 09-13-1929, 82 y.o.   MRN: [id number] Patient presents to be casted for a brace with [facility] Certified Pedorthist.  Patient will return in 4 weeks to be fitted.

## 2016-03-16 DIAGNOSIS — N183 Chronic kidney disease, stage 3 (moderate): Secondary | ICD-10-CM | POA: Diagnosis not present

## 2016-03-16 DIAGNOSIS — D649 Anemia, unspecified: Secondary | ICD-10-CM | POA: Diagnosis not present

## 2016-03-16 DIAGNOSIS — E7800 Pure hypercholesterolemia, unspecified: Secondary | ICD-10-CM | POA: Diagnosis not present

## 2016-03-16 DIAGNOSIS — E559 Vitamin D deficiency, unspecified: Secondary | ICD-10-CM | POA: Diagnosis not present

## 2016-03-16 DIAGNOSIS — I119 Hypertensive heart disease without heart failure: Secondary | ICD-10-CM | POA: Diagnosis not present

## 2016-03-16 DIAGNOSIS — Z0000 Encounter for general adult medical examination without abnormal findings: Secondary | ICD-10-CM | POA: Diagnosis not present

## 2016-03-16 DIAGNOSIS — M4800 Spinal stenosis, site unspecified: Secondary | ICD-10-CM | POA: Diagnosis not present

## 2016-03-16 DIAGNOSIS — M5430 Sciatica, unspecified side: Secondary | ICD-10-CM | POA: Diagnosis not present

## 2016-03-16 DIAGNOSIS — F39 Unspecified mood [affective] disorder: Secondary | ICD-10-CM | POA: Diagnosis not present

## 2016-03-16 DIAGNOSIS — M810 Age-related osteoporosis without current pathological fracture: Secondary | ICD-10-CM | POA: Diagnosis not present

## 2016-03-16 DIAGNOSIS — D509 Iron deficiency anemia, unspecified: Secondary | ICD-10-CM | POA: Diagnosis not present

## 2016-03-16 DIAGNOSIS — I82401 Acute embolism and thrombosis of unspecified deep veins of right lower extremity: Secondary | ICD-10-CM | POA: Diagnosis not present

## 2016-03-27 DIAGNOSIS — I10 Essential (primary) hypertension: Secondary | ICD-10-CM | POA: Diagnosis not present

## 2016-03-27 DIAGNOSIS — M21371 Foot drop, right foot: Secondary | ICD-10-CM | POA: Diagnosis not present

## 2016-03-27 DIAGNOSIS — D649 Anemia, unspecified: Secondary | ICD-10-CM | POA: Diagnosis not present

## 2016-03-27 DIAGNOSIS — I252 Old myocardial infarction: Secondary | ICD-10-CM | POA: Diagnosis not present

## 2016-03-27 DIAGNOSIS — Z7982 Long term (current) use of aspirin: Secondary | ICD-10-CM | POA: Diagnosis not present

## 2016-03-27 DIAGNOSIS — M810 Age-related osteoporosis without current pathological fracture: Secondary | ICD-10-CM | POA: Diagnosis not present

## 2016-03-27 DIAGNOSIS — S32030D Wedge compression fracture of third lumbar vertebra, subsequent encounter for fracture with routine healing: Secondary | ICD-10-CM | POA: Diagnosis not present

## 2016-03-27 DIAGNOSIS — Z87440 Personal history of urinary (tract) infections: Secondary | ICD-10-CM | POA: Diagnosis not present

## 2016-03-30 DIAGNOSIS — M810 Age-related osteoporosis without current pathological fracture: Secondary | ICD-10-CM | POA: Diagnosis not present

## 2016-03-30 DIAGNOSIS — S32030D Wedge compression fracture of third lumbar vertebra, subsequent encounter for fracture with routine healing: Secondary | ICD-10-CM | POA: Diagnosis not present

## 2016-03-30 DIAGNOSIS — D649 Anemia, unspecified: Secondary | ICD-10-CM | POA: Diagnosis not present

## 2016-03-30 DIAGNOSIS — M21371 Foot drop, right foot: Secondary | ICD-10-CM | POA: Diagnosis not present

## 2016-03-30 DIAGNOSIS — I10 Essential (primary) hypertension: Secondary | ICD-10-CM | POA: Diagnosis not present

## 2016-03-30 DIAGNOSIS — I252 Old myocardial infarction: Secondary | ICD-10-CM | POA: Diagnosis not present

## 2016-04-02 DIAGNOSIS — D649 Anemia, unspecified: Secondary | ICD-10-CM | POA: Diagnosis not present

## 2016-04-02 DIAGNOSIS — M810 Age-related osteoporosis without current pathological fracture: Secondary | ICD-10-CM | POA: Diagnosis not present

## 2016-04-02 DIAGNOSIS — S32030D Wedge compression fracture of third lumbar vertebra, subsequent encounter for fracture with routine healing: Secondary | ICD-10-CM | POA: Diagnosis not present

## 2016-04-02 DIAGNOSIS — M21371 Foot drop, right foot: Secondary | ICD-10-CM | POA: Diagnosis not present

## 2016-04-02 DIAGNOSIS — I10 Essential (primary) hypertension: Secondary | ICD-10-CM | POA: Diagnosis not present

## 2016-04-02 DIAGNOSIS — I252 Old myocardial infarction: Secondary | ICD-10-CM | POA: Diagnosis not present

## 2016-04-06 DIAGNOSIS — M810 Age-related osteoporosis without current pathological fracture: Secondary | ICD-10-CM | POA: Diagnosis not present

## 2016-04-06 DIAGNOSIS — I10 Essential (primary) hypertension: Secondary | ICD-10-CM | POA: Diagnosis not present

## 2016-04-06 DIAGNOSIS — I252 Old myocardial infarction: Secondary | ICD-10-CM | POA: Diagnosis not present

## 2016-04-06 DIAGNOSIS — D649 Anemia, unspecified: Secondary | ICD-10-CM | POA: Diagnosis not present

## 2016-04-06 DIAGNOSIS — M21371 Foot drop, right foot: Secondary | ICD-10-CM | POA: Diagnosis not present

## 2016-04-06 DIAGNOSIS — S32030D Wedge compression fracture of third lumbar vertebra, subsequent encounter for fracture with routine healing: Secondary | ICD-10-CM | POA: Diagnosis not present

## 2016-04-12 ENCOUNTER — Ambulatory Visit: Payer: Medicare Other | Admitting: *Deleted

## 2016-04-12 DIAGNOSIS — M21371 Foot drop, right foot: Secondary | ICD-10-CM

## 2016-04-12 NOTE — Progress Notes (Signed)
Patient ID: Molly [name], female   DOB: Oct 21, 1929, 82 y.o.   MRN: [id number] Patient presents for fitting of Matthews with [facility] Certified Pedorthist. Written and verbal break in instructions given. Patient will follow up in 6 weeks with Dr.

## 2016-04-13 DIAGNOSIS — M21371 Foot drop, right foot: Secondary | ICD-10-CM | POA: Diagnosis not present

## 2016-04-13 DIAGNOSIS — S32030D Wedge compression fracture of third lumbar vertebra, subsequent encounter for fracture with routine healing: Secondary | ICD-10-CM | POA: Diagnosis not present

## 2016-04-13 DIAGNOSIS — I10 Essential (primary) hypertension: Secondary | ICD-10-CM | POA: Diagnosis not present

## 2016-04-13 DIAGNOSIS — I252 Old myocardial infarction: Secondary | ICD-10-CM | POA: Diagnosis not present

## 2016-04-13 DIAGNOSIS — D649 Anemia, unspecified: Secondary | ICD-10-CM | POA: Diagnosis not present

## 2016-04-13 DIAGNOSIS — M810 Age-related osteoporosis without current pathological fracture: Secondary | ICD-10-CM | POA: Diagnosis not present

## 2016-04-15 DIAGNOSIS — M810 Age-related osteoporosis without current pathological fracture: Secondary | ICD-10-CM | POA: Diagnosis not present

## 2016-04-15 DIAGNOSIS — M21371 Foot drop, right foot: Secondary | ICD-10-CM | POA: Diagnosis not present

## 2016-04-15 DIAGNOSIS — I10 Essential (primary) hypertension: Secondary | ICD-10-CM | POA: Diagnosis not present

## 2016-04-15 DIAGNOSIS — I252 Old myocardial infarction: Secondary | ICD-10-CM | POA: Diagnosis not present

## 2016-04-15 DIAGNOSIS — D649 Anemia, unspecified: Secondary | ICD-10-CM | POA: Diagnosis not present

## 2016-04-15 DIAGNOSIS — S32030D Wedge compression fracture of third lumbar vertebra, subsequent encounter for fracture with routine healing: Secondary | ICD-10-CM | POA: Diagnosis not present

## 2016-04-20 DIAGNOSIS — M810 Age-related osteoporosis without current pathological fracture: Secondary | ICD-10-CM | POA: Diagnosis not present

## 2016-04-20 DIAGNOSIS — D649 Anemia, unspecified: Secondary | ICD-10-CM | POA: Diagnosis not present

## 2016-04-20 DIAGNOSIS — M21371 Foot drop, right foot: Secondary | ICD-10-CM | POA: Diagnosis not present

## 2016-04-20 DIAGNOSIS — I10 Essential (primary) hypertension: Secondary | ICD-10-CM | POA: Diagnosis not present

## 2016-04-20 DIAGNOSIS — I252 Old myocardial infarction: Secondary | ICD-10-CM | POA: Diagnosis not present

## 2016-04-20 DIAGNOSIS — S32030D Wedge compression fracture of third lumbar vertebra, subsequent encounter for fracture with routine healing: Secondary | ICD-10-CM | POA: Diagnosis not present

## 2016-04-22 DIAGNOSIS — M21371 Foot drop, right foot: Secondary | ICD-10-CM | POA: Diagnosis not present

## 2016-04-22 DIAGNOSIS — I10 Essential (primary) hypertension: Secondary | ICD-10-CM | POA: Diagnosis not present

## 2016-04-22 DIAGNOSIS — D649 Anemia, unspecified: Secondary | ICD-10-CM | POA: Diagnosis not present

## 2016-04-22 DIAGNOSIS — M810 Age-related osteoporosis without current pathological fracture: Secondary | ICD-10-CM | POA: Diagnosis not present

## 2016-04-22 DIAGNOSIS — S32030D Wedge compression fracture of third lumbar vertebra, subsequent encounter for fracture with routine healing: Secondary | ICD-10-CM | POA: Diagnosis not present

## 2016-04-22 DIAGNOSIS — I252 Old myocardial infarction: Secondary | ICD-10-CM | POA: Diagnosis not present

## 2016-04-27 DIAGNOSIS — I252 Old myocardial infarction: Secondary | ICD-10-CM | POA: Diagnosis not present

## 2016-04-27 DIAGNOSIS — M21371 Foot drop, right foot: Secondary | ICD-10-CM | POA: Diagnosis not present

## 2016-04-27 DIAGNOSIS — I10 Essential (primary) hypertension: Secondary | ICD-10-CM | POA: Diagnosis not present

## 2016-04-27 DIAGNOSIS — D649 Anemia, unspecified: Secondary | ICD-10-CM | POA: Diagnosis not present

## 2016-04-27 DIAGNOSIS — M810 Age-related osteoporosis without current pathological fracture: Secondary | ICD-10-CM | POA: Diagnosis not present

## 2016-04-27 DIAGNOSIS — S32030D Wedge compression fracture of third lumbar vertebra, subsequent encounter for fracture with routine healing: Secondary | ICD-10-CM | POA: Diagnosis not present

## 2016-04-29 DIAGNOSIS — I10 Essential (primary) hypertension: Secondary | ICD-10-CM | POA: Diagnosis not present

## 2016-04-29 DIAGNOSIS — I252 Old myocardial infarction: Secondary | ICD-10-CM | POA: Diagnosis not present

## 2016-04-29 DIAGNOSIS — D649 Anemia, unspecified: Secondary | ICD-10-CM | POA: Diagnosis not present

## 2016-04-29 DIAGNOSIS — M21371 Foot drop, right foot: Secondary | ICD-10-CM | POA: Diagnosis not present

## 2016-04-29 DIAGNOSIS — S32030D Wedge compression fracture of third lumbar vertebra, subsequent encounter for fracture with routine healing: Secondary | ICD-10-CM | POA: Diagnosis not present

## 2016-04-29 DIAGNOSIS — M810 Age-related osteoporosis without current pathological fracture: Secondary | ICD-10-CM | POA: Diagnosis not present

## 2016-05-04 DIAGNOSIS — I252 Old myocardial infarction: Secondary | ICD-10-CM | POA: Diagnosis not present

## 2016-05-04 DIAGNOSIS — S32030D Wedge compression fracture of third lumbar vertebra, subsequent encounter for fracture with routine healing: Secondary | ICD-10-CM | POA: Diagnosis not present

## 2016-05-04 DIAGNOSIS — D649 Anemia, unspecified: Secondary | ICD-10-CM | POA: Diagnosis not present

## 2016-05-04 DIAGNOSIS — M810 Age-related osteoporosis without current pathological fracture: Secondary | ICD-10-CM | POA: Diagnosis not present

## 2016-05-04 DIAGNOSIS — I10 Essential (primary) hypertension: Secondary | ICD-10-CM | POA: Diagnosis not present

## 2016-05-04 DIAGNOSIS — M21371 Foot drop, right foot: Secondary | ICD-10-CM | POA: Diagnosis not present

## 2016-05-06 DIAGNOSIS — D649 Anemia, unspecified: Secondary | ICD-10-CM | POA: Diagnosis not present

## 2016-05-06 DIAGNOSIS — M810 Age-related osteoporosis without current pathological fracture: Secondary | ICD-10-CM | POA: Diagnosis not present

## 2016-05-06 DIAGNOSIS — I10 Essential (primary) hypertension: Secondary | ICD-10-CM | POA: Diagnosis not present

## 2016-05-06 DIAGNOSIS — M21371 Foot drop, right foot: Secondary | ICD-10-CM | POA: Diagnosis not present

## 2016-05-06 DIAGNOSIS — I252 Old myocardial infarction: Secondary | ICD-10-CM | POA: Diagnosis not present

## 2016-05-06 DIAGNOSIS — S32030D Wedge compression fracture of third lumbar vertebra, subsequent encounter for fracture with routine healing: Secondary | ICD-10-CM | POA: Diagnosis not present

## 2016-05-11 DIAGNOSIS — S32030D Wedge compression fracture of third lumbar vertebra, subsequent encounter for fracture with routine healing: Secondary | ICD-10-CM | POA: Diagnosis not present

## 2016-05-11 DIAGNOSIS — I252 Old myocardial infarction: Secondary | ICD-10-CM | POA: Diagnosis not present

## 2016-05-11 DIAGNOSIS — I10 Essential (primary) hypertension: Secondary | ICD-10-CM | POA: Diagnosis not present

## 2016-05-11 DIAGNOSIS — M810 Age-related osteoporosis without current pathological fracture: Secondary | ICD-10-CM | POA: Diagnosis not present

## 2016-05-11 DIAGNOSIS — M21371 Foot drop, right foot: Secondary | ICD-10-CM | POA: Diagnosis not present

## 2016-05-11 DIAGNOSIS — D649 Anemia, unspecified: Secondary | ICD-10-CM | POA: Diagnosis not present

## 2016-05-13 DIAGNOSIS — M21371 Foot drop, right foot: Secondary | ICD-10-CM | POA: Diagnosis not present

## 2016-05-13 DIAGNOSIS — D649 Anemia, unspecified: Secondary | ICD-10-CM | POA: Diagnosis not present

## 2016-05-13 DIAGNOSIS — M810 Age-related osteoporosis without current pathological fracture: Secondary | ICD-10-CM | POA: Diagnosis not present

## 2016-05-13 DIAGNOSIS — S32030D Wedge compression fracture of third lumbar vertebra, subsequent encounter for fracture with routine healing: Secondary | ICD-10-CM | POA: Diagnosis not present

## 2016-05-13 DIAGNOSIS — I252 Old myocardial infarction: Secondary | ICD-10-CM | POA: Diagnosis not present

## 2016-05-13 DIAGNOSIS — I10 Essential (primary) hypertension: Secondary | ICD-10-CM | POA: Diagnosis not present

## 2016-05-17 ENCOUNTER — Ambulatory Visit (INDEPENDENT_AMBULATORY_CARE_PROVIDER_SITE_OTHER): Payer: Medicare Other | Admitting: Podiatry

## 2016-05-17 ENCOUNTER — Ambulatory Visit (INDEPENDENT_AMBULATORY_CARE_PROVIDER_SITE_OTHER): Payer: Medicare Other

## 2016-05-17 DIAGNOSIS — M2040 Other hammer toe(s) (acquired), unspecified foot: Secondary | ICD-10-CM | POA: Diagnosis not present

## 2016-05-17 DIAGNOSIS — M779 Enthesopathy, unspecified: Secondary | ICD-10-CM | POA: Diagnosis not present

## 2016-05-17 DIAGNOSIS — M79671 Pain in right foot: Secondary | ICD-10-CM

## 2016-05-17 DIAGNOSIS — M21371 Foot drop, right foot: Secondary | ICD-10-CM

## 2016-05-19 NOTE — Progress Notes (Signed)
Subjective:     Patient ID: Molly [name], female   DOB: 1929-04-21, 82 y.o.   MRN: [id number]  HPI patient presents stating that her second toe continues to bother her and swells towards the middle and end of the day and the brace while helping her has been hard for her to adjust   Review of Systems     Objective:   Physical Exam Neurovascular status unchanged with patient having collapse medial longitudinal arch and edematous second digit right that still painful when palpated    Assessment:     Appears to be some form of soft tissue or bone condition second digit right with patient also noted to have collapse medial longitudinal arch right    Plan:     Reviewed brace and she will continue to work with this and I re-x-rayed her foot today. I did splint the second and third digits together and instructed on ways to reduce the edema of the second toe and patient will be seen back for [place] to recheck again if symptoms persist over the next 2-4 weeks or earlier if any issues occur  Report indicated the possibility for a subtle fracture of the proximal phalanx right but very difficult to make a complete determination as to whether or not this is an issue

## 2016-06-16 DIAGNOSIS — M4856XD Collapsed vertebra, not elsewhere classified, lumbar region, subsequent encounter for fracture with routine healing: Secondary | ICD-10-CM | POA: Diagnosis not present

## 2016-06-16 DIAGNOSIS — S32030D Wedge compression fracture of third lumbar vertebra, subsequent encounter for fracture with routine healing: Secondary | ICD-10-CM | POA: Diagnosis not present

## 2016-06-16 DIAGNOSIS — M47812 Spondylosis without myelopathy or radiculopathy, cervical region: Secondary | ICD-10-CM | POA: Diagnosis not present

## 2016-06-16 DIAGNOSIS — Z981 Arthrodesis status: Secondary | ICD-10-CM | POA: Diagnosis not present

## 2016-06-16 DIAGNOSIS — M4186 Other forms of scoliosis, lumbar region: Secondary | ICD-10-CM | POA: Diagnosis not present

## 2016-06-16 DIAGNOSIS — S32038D Other fracture of third lumbar vertebra, subsequent encounter for fracture with routine healing: Secondary | ICD-10-CM | POA: Diagnosis not present

## 2016-06-16 DIAGNOSIS — M4312 Spondylolisthesis, cervical region: Secondary | ICD-10-CM | POA: Diagnosis not present

## 2016-06-16 DIAGNOSIS — Z9889 Other specified postprocedural states: Secondary | ICD-10-CM | POA: Diagnosis not present

## 2016-06-16 DIAGNOSIS — M4316 Spondylolisthesis, lumbar region: Secondary | ICD-10-CM | POA: Diagnosis not present

## 2016-06-16 DIAGNOSIS — S32018D Other fracture of first lumbar vertebra, subsequent encounter for fracture with routine healing: Secondary | ICD-10-CM | POA: Diagnosis not present

## 2016-06-28 DIAGNOSIS — M21371 Foot drop, right foot: Secondary | ICD-10-CM | POA: Diagnosis not present

## 2016-06-28 DIAGNOSIS — M19042 Primary osteoarthritis, left hand: Secondary | ICD-10-CM | POA: Diagnosis not present

## 2016-07-11 ENCOUNTER — Other Ambulatory Visit: Payer: Self-pay | Admitting: Interventional Cardiology

## 2016-07-19 ENCOUNTER — Other Ambulatory Visit: Payer: Medicare Other | Admitting: *Deleted

## 2016-08-23 ENCOUNTER — Other Ambulatory Visit: Payer: Medicare Other | Admitting: *Deleted

## 2016-09-17 DIAGNOSIS — Z23 Encounter for immunization: Secondary | ICD-10-CM | POA: Diagnosis not present

## 2016-09-25 ENCOUNTER — Other Ambulatory Visit: Payer: Self-pay | Admitting: Interventional Cardiology

## 2016-10-04 DIAGNOSIS — I25118 Atherosclerotic heart disease of native coronary artery with other forms of angina pectoris: Secondary | ICD-10-CM | POA: Diagnosis not present

## 2016-10-04 DIAGNOSIS — E559 Vitamin D deficiency, unspecified: Secondary | ICD-10-CM | POA: Diagnosis not present

## 2016-10-04 DIAGNOSIS — K219 Gastro-esophageal reflux disease without esophagitis: Secondary | ICD-10-CM | POA: Diagnosis not present

## 2016-10-04 DIAGNOSIS — D649 Anemia, unspecified: Secondary | ICD-10-CM | POA: Diagnosis not present

## 2016-10-04 DIAGNOSIS — K580 Irritable bowel syndrome with diarrhea: Secondary | ICD-10-CM | POA: Diagnosis not present

## 2016-10-04 DIAGNOSIS — L821 Other seborrheic keratosis: Secondary | ICD-10-CM | POA: Diagnosis not present

## 2016-10-04 DIAGNOSIS — M5430 Sciatica, unspecified side: Secondary | ICD-10-CM | POA: Diagnosis not present

## 2016-10-04 DIAGNOSIS — R2681 Unsteadiness on feet: Secondary | ICD-10-CM | POA: Diagnosis not present

## 2016-10-04 DIAGNOSIS — D225 Melanocytic nevi of trunk: Secondary | ICD-10-CM | POA: Diagnosis not present

## 2016-10-04 DIAGNOSIS — Z85828 Personal history of other malignant neoplasm of skin: Secondary | ICD-10-CM | POA: Diagnosis not present

## 2016-10-04 DIAGNOSIS — M810 Age-related osteoporosis without current pathological fracture: Secondary | ICD-10-CM | POA: Diagnosis not present

## 2016-10-04 DIAGNOSIS — I129 Hypertensive chronic kidney disease with stage 1 through stage 4 chronic kidney disease, or unspecified chronic kidney disease: Secondary | ICD-10-CM | POA: Diagnosis not present

## 2016-10-04 DIAGNOSIS — I119 Hypertensive heart disease without heart failure: Secondary | ICD-10-CM | POA: Diagnosis not present

## 2016-10-04 DIAGNOSIS — N183 Chronic kidney disease, stage 3 (moderate): Secondary | ICD-10-CM | POA: Diagnosis not present

## 2016-10-04 DIAGNOSIS — L820 Inflamed seborrheic keratosis: Secondary | ICD-10-CM | POA: Diagnosis not present

## 2016-11-15 ENCOUNTER — Other Ambulatory Visit: Payer: Self-pay | Admitting: Interventional Cardiology

## 2016-12-27 ENCOUNTER — Other Ambulatory Visit: Payer: Self-pay | Admitting: Interventional Cardiology

## 2016-12-27 ENCOUNTER — Encounter: Payer: Self-pay | Admitting: Podiatry

## 2016-12-27 ENCOUNTER — Ambulatory Visit (INDEPENDENT_AMBULATORY_CARE_PROVIDER_SITE_OTHER): Payer: Medicare Other

## 2016-12-27 ENCOUNTER — Ambulatory Visit (INDEPENDENT_AMBULATORY_CARE_PROVIDER_SITE_OTHER): Payer: Medicare Other | Admitting: Podiatry

## 2016-12-27 DIAGNOSIS — M2040 Other hammer toe(s) (acquired), unspecified foot: Secondary | ICD-10-CM | POA: Diagnosis not present

## 2016-12-27 DIAGNOSIS — M779 Enthesopathy, unspecified: Secondary | ICD-10-CM | POA: Diagnosis not present

## 2016-12-27 DIAGNOSIS — M79672 Pain in left foot: Principal | ICD-10-CM

## 2016-12-27 DIAGNOSIS — M79671 Pain in right foot: Secondary | ICD-10-CM

## 2016-12-27 MED ORDER — TRIAMCINOLONE ACETONIDE 10 MG/ML IJ SUSP
10.0000 mg | Freq: Once | INTRAMUSCULAR | Status: AC
  Administered 2016-12-27: 10 mg

## 2017-01-02 NOTE — Progress Notes (Signed)
Subjective:     Patient ID: Molly [name], female   DOB: 24-Dec-1928, 83 y.o.   MRN: [id number]  HPI patient presents with significant structural deformity with digital deformities of the lesser digits bilateral and inflammation pain around metatarsophalangeal joint left that she states that she's tried to soak and use shoe gear with. Presents with caregiver   Review of Systems     Objective:   Physical Exam Neurovascular status unchanged with patient noted to have significant arthritis MPJ interphalangeal joint of the left hallux with digital deformities of the lesser toes bilateral    Assessment:     Inflammatory capsulitis interphalangeal joint left big toe and metatarsophalangeal joint along with hammertoe deformity    Plan:     H&P and conditions discussed separately. Today I did interphalangeal joint injection left 3 mg Kenalog 5 mg Xylocaine and then went ahead and I applied padding to the toes and discuss digital correction if symptoms persist

## 2017-01-08 ENCOUNTER — Other Ambulatory Visit: Payer: Self-pay | Admitting: Interventional Cardiology

## 2017-01-17 DIAGNOSIS — R3989 Other symptoms and signs involving the genitourinary system: Secondary | ICD-10-CM | POA: Diagnosis not present

## 2017-01-17 DIAGNOSIS — N760 Acute vaginitis: Secondary | ICD-10-CM | POA: Diagnosis not present

## 2017-01-17 DIAGNOSIS — N3090 Cystitis, unspecified without hematuria: Secondary | ICD-10-CM | POA: Diagnosis not present

## 2017-02-18 DIAGNOSIS — Z961 Presence of intraocular lens: Secondary | ICD-10-CM | POA: Diagnosis not present

## 2017-02-18 DIAGNOSIS — H52203 Unspecified astigmatism, bilateral: Secondary | ICD-10-CM | POA: Diagnosis not present

## 2017-03-01 DIAGNOSIS — C44329 Squamous cell carcinoma of skin of other parts of face: Secondary | ICD-10-CM | POA: Diagnosis not present

## 2017-03-01 DIAGNOSIS — D485 Neoplasm of uncertain behavior of skin: Secondary | ICD-10-CM | POA: Diagnosis not present

## 2017-03-01 DIAGNOSIS — Z85828 Personal history of other malignant neoplasm of skin: Secondary | ICD-10-CM | POA: Diagnosis not present

## 2017-03-15 NOTE — Progress Notes (Signed)
Patient ID: Molly [name], female   DOB: Oct 28, 1929, 83 y.o.   MRN: [id number]     Cardiology Office Note   Date:  03/16/2017   ID:  Molly [name], DOB 12-19-1928, MRN [id number]  PCP:  Osborne Casco, MD    No chief complaint on file. CAD   Wt Readings from Last 3 Encounters:  03/16/17 163 lb (73.9 kg)  02/17/16 150 lb (68 kg)  11/17/15 155 lb (70.3 kg)       History of Present Illness: Molly [name] is a 83 y.o. female  who has had CAD, prior BMS to RCA. She had a mild abnormality on her stress test in 2012. She has been managed medically.  Prior to her stent, she had a chest pain that would not resolve.    She had back surgery in 2016.  It was very complicated.  She had a DVT and then was treated with heparin. There was apparent postoperative bleeding. She subsequent had a IVC filter placed. She is not a candidate for anticoagulation at this time. She did recover fairly well after the second surgery. She had some right foot drop postoperatively. Her pain is much better. During all this, she had no chest discomfort or any cardiac issues.  Her daughter adjusts Lasix for swelling. The patient is tolerated this well. Her blood pressure has been stable.  Her husband passed away just before he would have turned 33, in 2017.      Past Medical History:  Diagnosis Date  . Acid reflux   . Anemia   . Arthritis   . CAD (coronary artery disease)   . High cholesterol   . Hyperlipidemia   . Hypertension   . Osteoporosis   . Spinal stenosis     Past Surgical History:  Procedure Laterality Date  . ABDOMINAL HYSTERECTOMY    . CARDIAC CATHETERIZATION N/A 11/17/2015   Procedure: Left Heart Cath and Coronary Angiography;  Surgeon: Jettie Booze, MD;  Location: [facility];  Service: Cardiovascular;  Laterality: N/A;  . CAROTID STENT       Current Outpatient Prescriptions  Medication Sig Dispense Refill  . acetaminophen (TYLENOL) 500 MG tablet  Take 500 mg by mouth every 8 (eight) hours as needed.     Marland Kitchen aspirin EC 81 MG tablet Take 81 mg by mouth every morning.    . bisacodyl (DULCOLAX) 10 MG suppository Place 10 mg rectally daily as needed for moderate constipation (Averages every other day).    Marland Kitchen denosumab (PROLIA) 60 MG/ML SOLN injection Inject 60 mg into the skin every 6 (six) months. Administer in upper arm, thigh, or abdomen    . esomeprazole (NEXIUM) 40 MG capsule Take 40 mg by mouth daily. Takes 1 time in the morning, and take another at night if needed.    . gabapentin (NEURONTIN) 100 MG capsule Take 100 mg by mouth daily as needed.     . Glucosamine-Chondroitin (OSTEO BI-FLEX REGULAR STRENGTH PO) Take 1 capsule by mouth 2 (two) times daily.    . isosorbide mononitrate (IMDUR) 30 MG 24 hr tablet Take 30 mg by mouth 2 (two) times daily.    . metoprolol succinate (TOPROL-XL) 25 MG 24 hr tablet Take 25 mg by mouth daily.    . Misc Natural Products (NARCOSOFT HERBAL LAX PO) Take 1 capsule by mouth 2 (two) times daily.     . nitroGLYCERIN (NITROSTAT) 0.4 MG SL tablet Place 0.4 mg under the tongue every 5 (five) minutes as needed  for chest pain (x 3 pills).     Marland Kitchen omega-3 acid ethyl esters (LOVAZA) 1 g capsule TAKE 1 CAPSULE TWICE A DAY 180 capsule 0  . polysaccharide iron (NIFEREX) 150 MG CAPS capsule Take 150 mg by mouth every morning.    . Probiotic Product (ALIGN) 4 MG CAPS Take 1 tablet by mouth every morning.    . ranolazine (RANEXA) 500 MG 12 hr tablet Take 1 tablet (500 mg total) by mouth 2 (two) times daily. *Pt is overdue for an appt and needs to call and schedule for further refills* 60 tablet 0  . rosuvastatin (CRESTOR) 20 MG tablet Take 20 mg by mouth at bedtime.    . sennosides-docusate sodium (SENOKOT-S) 8.6-50 MG tablet Take 3 tablets by mouth 2 (two) times daily.    . Turmeric 500 MG CAPS Take by mouth 4 (four) times daily.    . vitamin C (ASCORBIC ACID) 250 MG tablet Take 250 mg by mouth daily.     No current  facility-administered medications for this visit.     Allergies:   Buprenorphine hcl; Codeine; Hydrocodone; Morphine and related; Sulfa antibiotics; Sulfacetamide sodium; Vicodin [hydrocodone-acetaminophen]; Evista [raloxifene]; Hydromorphone; Other; Sulfamethoxazole; Sulfamethoxazole-trimethoprim; Tape; and Morphine    Social History:  The patient  reports that she has never smoked. She has never used smokeless tobacco. She reports that she does not drink alcohol or use drugs.   Family History:  The patient's family history includes Anemia in her mother; Heart attack in her brother.    ROS:  Please see the history of present illness.   Otherwise, review of systems are positive for leg edema.   All other systems are reviewed and negative.    PHYSICAL EXAM: VS:  BP 118/80   Pulse 66   Ht 5\' 2"  (1.575 m)   Wt 163 lb (73.9 kg)   BMI 29.81 kg/m  , BMI Body mass index is 29.81 kg/m. GEN: Well nourished, well developed, in no acute distress  HEENT: normal  Neck: no JVD, carotid bruits, or masses Cardiac: RRR; no murmurs, rubs, or gallops, bilateral lower extremity edema  Respiratory:  clear to auscultation bilaterally, normal work of breathing GI: soft, nontender, nondistended, + BS MS: no deformity or atrophy  Skin: warm and dry, no rash Neuro:  Strength and sensation are intact Psych: euthymic mood, flat affect  ECG: NSR, no ST changes   Recent Labs: No results found for requested labs within last 8760 hours.   Lipid Panel    Component Value Date/Time   CHOL 203 (H) 11/18/2014 1002   TRIG 127.0 11/18/2014 1002   HDL 91.00 11/18/2014 1002   CHOLHDL 2 11/18/2014 1002   VLDL 25.4 11/18/2014 1002   LDLCALC 87 11/18/2014 1002     Other studies Reviewed: Additional studies/ records that were reviewed today with results demonstrating: .   ASSESSMENT AND PLAN:  1. Chronic diastolic heart failure: Appears euvolemic.  Now off of Lasix.  COntinue to follow weight.  SHOB occur  at times but is more likely related to deconditioning. 2. Coronary artery disease: Has had occasional angina with stress and increased BP.  WIll wean of Ranexa due to cost.  She is not as active as she was a few years ago due to overall declining health.  Change Ranexa to 1 tab a day for 7 days and then off.  Uses SL NTG when BP is increased.  Stop Lovaza due to cost and start Carlson's fish oil OTC, 2 gram  BID.  Can freeze capsules.  3. *Edema: Elevate legs. Will continue to wear stockings. 4. GERD: OK to switch to omeprazole which may be on formulary.   5. Avoid falling.  She has right foot drop.  Needs 24 hour assistance.    Current medicines are reviewed at length with the patient today.  The patient concerns regarding her medicines were addressed.  The following changes have been made:  No change  Labs/ tests ordered today include:  No orders of the defined types were placed in this encounter.   Recommend 150 minutes/week of aerobic exercise Low fat, low carb, high fiber diet recommended  Disposition:   FU in when necessary at this point as she has multiple appointments with other doctors.   Signed, Larae Grooms, MD  03/16/2017 4:40 PM    [facility] [address], [place], [place]  [address] Phone: [phone]; Fax: [phone]

## 2017-03-16 ENCOUNTER — Ambulatory Visit (INDEPENDENT_AMBULATORY_CARE_PROVIDER_SITE_OTHER): Payer: Medicare Other | Admitting: Interventional Cardiology

## 2017-03-16 ENCOUNTER — Encounter: Payer: Self-pay | Admitting: Interventional Cardiology

## 2017-03-16 VITALS — BP 118/80 | HR 66 | Ht 62.0 in | Wt 163.0 lb

## 2017-03-16 DIAGNOSIS — I2510 Atherosclerotic heart disease of native coronary artery without angina pectoris: Secondary | ICD-10-CM | POA: Diagnosis not present

## 2017-03-16 DIAGNOSIS — I5032 Chronic diastolic (congestive) heart failure: Secondary | ICD-10-CM | POA: Diagnosis not present

## 2017-03-16 DIAGNOSIS — K219 Gastro-esophageal reflux disease without esophagitis: Secondary | ICD-10-CM | POA: Diagnosis not present

## 2017-03-16 DIAGNOSIS — R0602 Shortness of breath: Secondary | ICD-10-CM

## 2017-03-16 NOTE — Patient Instructions (Signed)
Medication Instructions:  Wean Ranexa to off. You will take 1 tablet daily for 1 week and then stop. Stop Lavasa. Take over the counter fish oil instead. You may store fish oil caplets in the freezer to reduce acid reflux.  Labwork: None ordered.  Testing/Procedures: None.  Follow-Up: Follow up as needed.  Any Other Special Instructions Will Be Listed Below (If Applicable).     If you need a refill on your cardiac medications before your next appointment, please call your pharmacy.

## 2017-03-22 ENCOUNTER — Encounter: Payer: Self-pay | Admitting: Interventional Cardiology

## 2017-03-23 ENCOUNTER — Other Ambulatory Visit: Payer: Self-pay | Admitting: *Deleted

## 2017-03-23 MED ORDER — METOPROLOL SUCCINATE ER 25 MG PO TB24: 25.0000 mg | ORAL_TABLET | Freq: Every day | ORAL | 3 refills | Status: DC

## 2017-03-23 MED ORDER — ISOSORBIDE MONONITRATE ER 30 MG PO TB24: 30.0000 mg | ORAL_TABLET | Freq: Two times a day (BID) | ORAL | 3 refills | Status: DC

## 2017-05-26 ENCOUNTER — Other Ambulatory Visit: Payer: Self-pay | Admitting: Family Medicine

## 2017-05-26 DIAGNOSIS — Z1231 Encounter for screening mammogram for malignant neoplasm of breast: Secondary | ICD-10-CM

## 2017-06-08 ENCOUNTER — Ambulatory Visit
Admission: RE | Admit: 2017-06-08 | Discharge: 2017-06-08 | Disposition: A | Discharge status: Home / Self Care | Payer: Medicare Other | Source: Ambulatory Visit | Attending: Family Medicine | Admitting: Family Medicine

## 2017-06-08 DIAGNOSIS — Z1231 Encounter for screening mammogram for malignant neoplasm of breast: Secondary | ICD-10-CM

## 2017-06-22 ENCOUNTER — Ambulatory Visit (INDEPENDENT_AMBULATORY_CARE_PROVIDER_SITE_OTHER): Payer: Medicare Other | Admitting: Podiatry

## 2017-06-22 DIAGNOSIS — M779 Enthesopathy, unspecified: Secondary | ICD-10-CM | POA: Diagnosis not present

## 2017-06-22 MED ORDER — TRIAMCINOLONE ACETONIDE 10 MG/ML IJ SUSP
10.0000 mg | Freq: Once | INTRAMUSCULAR | Status: AC
  Administered 2017-06-22: 10 mg

## 2017-06-23 NOTE — Progress Notes (Signed)
Subjective:    Patient ID: Molly [name], female   DOB: 83 y.o.   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 I did a proximal nerve block I then injected around the interphalangeal joint 3 mg Dexon some Kenalog 5 mill grams Xylocaine and advised on supportive shoes. Reappoint to recheck

## 2017-07-14 ENCOUNTER — Encounter: Payer: Self-pay | Admitting: Podiatry

## 2017-07-14 ENCOUNTER — Ambulatory Visit (INDEPENDENT_AMBULATORY_CARE_PROVIDER_SITE_OTHER): Payer: Medicare Other | Admitting: Podiatry

## 2017-07-14 DIAGNOSIS — L600 Ingrowing nail: Secondary | ICD-10-CM | POA: Diagnosis not present

## 2017-07-14 DIAGNOSIS — L84 Corns and callosities: Secondary | ICD-10-CM | POA: Diagnosis not present

## 2017-07-14 NOTE — Patient Instructions (Addendum)

## 2017-07-15 NOTE — Progress Notes (Signed)
Subjective:    Patient ID: Molly [name], female   DOB: 83 y.o.   MRN: [id number]   HPI patient states the nail on the fourth toe has become thickened and irritated and there is a corn at the end of the third toe right foot that's become painful and hard for me to wear shoe gear with    ROS      Objective:  Physical Exam neurovascular status was found to be intact with patient having good digital perfusion was noted to have an irritated thick and fourth nail right with history of several other nails been removed and distal keratotic lesion third digit right     Assessment:    Chronic nail disease fourth right with pain along with distal keratotic lesion digit 3 right that's painful when palpated     Plan:     H&P condition reviewed and at this point I do think this nail could be removed safely and I reviewed this with her and her caregiver. They want to have this done and she's done well with previous permanent procedure so I did go ahead and recommend this and they understand risk. I infiltrated the right fourth and third toe with 100 mg Xylocaine Marcaine mixture and under sterile conditions remove the fourth nail exposing matrix and applied phenol 3 applications 30 seconds and sterile dressing. For the third digit I debrided distal tissue with sharp instrumentation dispensed a buttress pad and gave instructions on lifting the toes and will be seen back to recheck as symptoms indicate

## 2017-10-26 ENCOUNTER — Encounter: Payer: Self-pay | Admitting: Podiatry

## 2017-10-26 ENCOUNTER — Ambulatory Visit: Payer: Medicare Other | Admitting: Podiatry

## 2017-10-26 DIAGNOSIS — L84 Corns and callosities: Secondary | ICD-10-CM | POA: Diagnosis not present

## 2017-10-26 DIAGNOSIS — M779 Enthesopathy, unspecified: Secondary | ICD-10-CM

## 2017-10-26 MED ORDER — TRIAMCINOLONE ACETONIDE 10 MG/ML IJ SUSP
10.0000 mg | Freq: Once | INTRAMUSCULAR | Status: AC
  Administered 2017-10-26: 10 mg

## 2017-10-26 NOTE — Progress Notes (Signed)
Subjective:    Patient ID: Molly [name], female   DOB: 83 y.o.   MRN: [id number]   HPI patient presents with lesions bilateral and pain of the big toe joint left foot with fluid buildup    ROS      Objective:  Physical Exam neurovascular status intact with keratotic lesion third digit right that's painful and inflammation of the left first MPJ with fluid around the joint surface     Assessment:   Chronic lesion distal third toe right it's very tender and inflammatory changes of the capsule first MPJ left      Plan:    Sterile sharp debridement of lesion with padding and third digit left with no bleeding and injected around the first MPJ left 3 mg Kenalog 5 mill grams I can Marcaine that was tolerated well

## 2017-12-07 ENCOUNTER — Emergency Department (HOSPITAL_COMMUNITY): Payer: Medicare Other

## 2017-12-07 ENCOUNTER — Inpatient Hospital Stay (HOSPITAL_COMMUNITY)
Admission: EM | Admit: 2017-12-07 | Discharge: 2017-12-12 | DRG: 062 | Disposition: A | Discharge status: Rehabilitation Facility | Payer: Medicare Other | Attending: Neurology | Admitting: Neurology

## 2017-12-07 ENCOUNTER — Inpatient Hospital Stay (HOSPITAL_COMMUNITY): Payer: Medicare Other

## 2017-12-07 ENCOUNTER — Other Ambulatory Visit: Payer: Self-pay

## 2017-12-07 ENCOUNTER — Encounter (HOSPITAL_COMMUNITY): Payer: Self-pay

## 2017-12-07 DIAGNOSIS — I4891 Unspecified atrial fibrillation: Secondary | ICD-10-CM | POA: Diagnosis present

## 2017-12-07 DIAGNOSIS — Z882 Allergy status to sulfonamides status: Secondary | ICD-10-CM | POA: Diagnosis not present

## 2017-12-07 DIAGNOSIS — I351 Nonrheumatic aortic (valve) insufficiency: Secondary | ICD-10-CM

## 2017-12-07 DIAGNOSIS — E876 Hypokalemia: Secondary | ICD-10-CM | POA: Diagnosis present

## 2017-12-07 DIAGNOSIS — Z7409 Other reduced mobility: Secondary | ICD-10-CM | POA: Diagnosis not present

## 2017-12-07 DIAGNOSIS — D62 Acute posthemorrhagic anemia: Secondary | ICD-10-CM

## 2017-12-07 DIAGNOSIS — I2510 Atherosclerotic heart disease of native coronary artery without angina pectoris: Secondary | ICD-10-CM | POA: Diagnosis present

## 2017-12-07 DIAGNOSIS — M810 Age-related osteoporosis without current pathological fracture: Secondary | ICD-10-CM | POA: Diagnosis present

## 2017-12-07 DIAGNOSIS — E785 Hyperlipidemia, unspecified: Secondary | ICD-10-CM | POA: Diagnosis present

## 2017-12-07 DIAGNOSIS — M48061 Spinal stenosis, lumbar region without neurogenic claudication: Secondary | ICD-10-CM | POA: Diagnosis not present

## 2017-12-07 DIAGNOSIS — D696 Thrombocytopenia, unspecified: Secondary | ICD-10-CM | POA: Diagnosis present

## 2017-12-07 DIAGNOSIS — I10 Essential (primary) hypertension: Secondary | ICD-10-CM | POA: Diagnosis present

## 2017-12-07 DIAGNOSIS — I480 Paroxysmal atrial fibrillation: Secondary | ICD-10-CM | POA: Diagnosis not present

## 2017-12-07 DIAGNOSIS — M792 Neuralgia and neuritis, unspecified: Secondary | ICD-10-CM

## 2017-12-07 DIAGNOSIS — M5416 Radiculopathy, lumbar region: Secondary | ICD-10-CM | POA: Diagnosis not present

## 2017-12-07 DIAGNOSIS — Z90710 Acquired absence of both cervix and uterus: Secondary | ICD-10-CM

## 2017-12-07 DIAGNOSIS — Z885 Allergy status to narcotic agent status: Secondary | ICD-10-CM | POA: Diagnosis not present

## 2017-12-07 DIAGNOSIS — I639 Cerebral infarction, unspecified: Secondary | ICD-10-CM | POA: Diagnosis present

## 2017-12-07 DIAGNOSIS — I69391 Dysphagia following cerebral infarction: Secondary | ICD-10-CM

## 2017-12-07 DIAGNOSIS — I63411 Cerebral infarction due to embolism of right middle cerebral artery: Secondary | ICD-10-CM | POA: Diagnosis present

## 2017-12-07 DIAGNOSIS — R4701 Aphasia: Secondary | ICD-10-CM | POA: Diagnosis present

## 2017-12-07 DIAGNOSIS — K5901 Slow transit constipation: Secondary | ICD-10-CM | POA: Diagnosis not present

## 2017-12-07 DIAGNOSIS — D649 Anemia, unspecified: Secondary | ICD-10-CM | POA: Diagnosis present

## 2017-12-07 DIAGNOSIS — Z79899 Other long term (current) drug therapy: Secondary | ICD-10-CM

## 2017-12-07 DIAGNOSIS — R471 Dysarthria and anarthria: Secondary | ICD-10-CM | POA: Diagnosis present

## 2017-12-07 DIAGNOSIS — Z91048 Other nonmedicinal substance allergy status: Secondary | ICD-10-CM | POA: Diagnosis not present

## 2017-12-07 DIAGNOSIS — I69354 Hemiplegia and hemiparesis following cerebral infarction affecting left non-dominant side: Secondary | ICD-10-CM | POA: Diagnosis not present

## 2017-12-07 DIAGNOSIS — I63511 Cerebral infarction due to unspecified occlusion or stenosis of right middle cerebral artery: Secondary | ICD-10-CM | POA: Diagnosis not present

## 2017-12-07 DIAGNOSIS — I951 Orthostatic hypotension: Secondary | ICD-10-CM | POA: Diagnosis not present

## 2017-12-07 DIAGNOSIS — Z7982 Long term (current) use of aspirin: Secondary | ICD-10-CM

## 2017-12-07 DIAGNOSIS — I69398 Other sequelae of cerebral infarction: Secondary | ICD-10-CM | POA: Diagnosis not present

## 2017-12-07 DIAGNOSIS — M21371 Foot drop, right foot: Secondary | ICD-10-CM | POA: Diagnosis present

## 2017-12-07 DIAGNOSIS — K219 Gastro-esophageal reflux disease without esophagitis: Secondary | ICD-10-CM | POA: Diagnosis present

## 2017-12-07 DIAGNOSIS — G8194 Hemiplegia, unspecified affecting left nondominant side: Secondary | ICD-10-CM | POA: Diagnosis present

## 2017-12-07 DIAGNOSIS — R29710 NIHSS score 10: Secondary | ICD-10-CM | POA: Diagnosis present

## 2017-12-07 DIAGNOSIS — Z888 Allergy status to other drugs, medicaments and biological substances status: Secondary | ICD-10-CM | POA: Diagnosis not present

## 2017-12-07 DIAGNOSIS — R1310 Dysphagia, unspecified: Secondary | ICD-10-CM | POA: Diagnosis present

## 2017-12-07 DIAGNOSIS — Z8249 Family history of ischemic heart disease and other diseases of the circulatory system: Secondary | ICD-10-CM | POA: Diagnosis not present

## 2017-12-07 DIAGNOSIS — R260 Ataxic gait: Secondary | ICD-10-CM | POA: Diagnosis present

## 2017-12-07 DIAGNOSIS — R269 Unspecified abnormalities of gait and mobility: Secondary | ICD-10-CM | POA: Diagnosis not present

## 2017-12-07 DIAGNOSIS — Z86718 Personal history of other venous thrombosis and embolism: Secondary | ICD-10-CM | POA: Diagnosis not present

## 2017-12-07 DIAGNOSIS — I6601 Occlusion and stenosis of right middle cerebral artery: Secondary | ICD-10-CM | POA: Diagnosis not present

## 2017-12-07 DIAGNOSIS — I6300 Cerebral infarction due to thrombosis of unspecified precerebral artery: Secondary | ICD-10-CM | POA: Diagnosis not present

## 2017-12-07 DIAGNOSIS — G459 Transient cerebral ischemic attack, unspecified: Secondary | ICD-10-CM | POA: Diagnosis not present

## 2017-12-07 HISTORY — DX: Cerebral infarction, unspecified: I63.9

## 2017-12-07 LAB — CBC
HEMATOCRIT: 38.7 % (ref 36.0–46.0)
Hemoglobin: 13.1 g/dL (ref 12.0–15.0)
MCH: 32.7 pg (ref 26.0–34.0)
MCHC: 33.9 g/dL (ref 30.0–36.0)
MCV: 96.5 fL (ref 78.0–100.0)
Platelets: 172 10*3/uL (ref 150–400)
RBC: 4.01 MIL/uL (ref 3.87–5.11)
RDW: 13.5 % (ref 11.5–15.5)
WBC: 6.1 10*3/uL (ref 4.0–10.5)

## 2017-12-07 LAB — COMPREHENSIVE METABOLIC PANEL
ALT: 24 U/L (ref 14–54)
AST: 36 U/L (ref 15–41)
Albumin: 3.9 g/dL (ref 3.5–5.0)
Alkaline Phosphatase: 34 U/L — ABNORMAL LOW (ref 38–126)
Anion gap: 9 (ref 5–15)
BUN: 13 mg/dL (ref 6–20)
CHLORIDE: 107 mmol/L (ref 101–111)
CO2: 24 mmol/L (ref 22–32)
CREATININE: 0.91 mg/dL (ref 0.44–1.00)
Calcium: 9.5 mg/dL (ref 8.9–10.3)
GFR, EST NON AFRICAN AMERICAN: 56 mL/min — AB (ref >60)
Glucose, Bld: 101 mg/dL — ABNORMAL HIGH (ref 65–99)
POTASSIUM: 4.5 mmol/L (ref 3.5–5.1)
Sodium: 140 mmol/L (ref 135–145)
Total Bilirubin: 0.7 mg/dL (ref 0.3–1.2)
Total Protein: 7 g/dL (ref 6.5–8.1)

## 2017-12-07 LAB — I-STAT CHEM 8, ED
BUN: 14 mg/dL (ref 6–20)
Calcium, Ion: 1.11 mmol/L — ABNORMAL LOW (ref 1.15–1.40)
Chloride: 108 mmol/L (ref 101–111)
Creatinine, Ser: 0.9 mg/dL (ref 0.44–1.00)
GLUCOSE: 96 mg/dL (ref 65–99)
HCT: 37 % (ref 36.0–46.0)
HEMOGLOBIN: 12.6 g/dL (ref 12.0–15.0)
Potassium: 4.4 mmol/L (ref 3.5–5.1)
SODIUM: 140 mmol/L (ref 135–145)
TCO2: 21 mmol/L — ABNORMAL LOW (ref 22–32)

## 2017-12-07 LAB — ECHOCARDIOGRAM COMPLETE
Height: 63 in
WEIGHTICAEL: 2663.16 [oz_av]

## 2017-12-07 LAB — DIFFERENTIAL
BASOS PCT: 1 %
Basophils Absolute: 0 10*3/uL (ref 0.0–0.1)
EOS ABS: 0.1 10*3/uL (ref 0.0–0.7)
EOS PCT: 2 %
Lymphocytes Relative: 48 %
Lymphs Abs: 2.9 10*3/uL (ref 0.7–4.0)
MONO ABS: 0.4 10*3/uL (ref 0.1–1.0)
MONOS PCT: 7 %
Neutro Abs: 2.6 10*3/uL (ref 1.7–7.7)
Neutrophils Relative %: 42 %

## 2017-12-07 LAB — URINALYSIS, ROUTINE W REFLEX MICROSCOPIC
Bilirubin Urine: NEGATIVE
Glucose, UA: NEGATIVE mg/dL
HGB URINE DIPSTICK: NEGATIVE
Ketones, ur: NEGATIVE mg/dL
Leukocytes, UA: NEGATIVE
Nitrite: NEGATIVE
Protein, ur: NEGATIVE mg/dL
SPECIFIC GRAVITY, URINE: 1.025 (ref 1.005–1.030)
pH: 6 (ref 5.0–8.0)

## 2017-12-07 LAB — I-STAT TROPONIN, ED: TROPONIN I, POC: 0 ng/mL (ref 0.00–0.08)

## 2017-12-07 LAB — CBG MONITORING, ED: GLUCOSE-CAPILLARY: 93 mg/dL (ref 65–99)

## 2017-12-07 LAB — ETHANOL

## 2017-12-07 LAB — PROTIME-INR
INR: 1.01
Prothrombin Time: 13.2 seconds (ref 11.4–15.2)

## 2017-12-07 LAB — RAPID URINE DRUG SCREEN, HOSP PERFORMED
Amphetamines: NOT DETECTED
BENZODIAZEPINES: NOT DETECTED
Barbiturates: NOT DETECTED
Cocaine: NOT DETECTED
Opiates: NOT DETECTED
Tetrahydrocannabinol: NOT DETECTED

## 2017-12-07 LAB — MRSA PCR SCREENING: MRSA by PCR: NEGATIVE

## 2017-12-07 LAB — APTT: aPTT: 31 seconds (ref 24–36)

## 2017-12-07 MED ORDER — ACETAMINOPHEN 650 MG RE SUPP: 650.0000 mg | RECTAL | Status: DC | PRN

## 2017-12-07 MED ORDER — ACETAMINOPHEN 160 MG/5ML PO SOLN: 650.0000 mg | ORAL | Status: DC | PRN

## 2017-12-07 MED ORDER — ALTEPLASE (STROKE) FULL DOSE INFUSION
0.9000 mg/kg | Freq: Once | INTRAVENOUS | Status: AC
  Administered 2017-12-07: 67 mg via INTRAVENOUS

## 2017-12-07 MED ORDER — STROKE: EARLY STAGES OF RECOVERY BOOK
Freq: Once | Status: AC
  Administered 2017-12-07: 15:00:00
  Filled 2017-12-07: qty 1

## 2017-12-07 MED ORDER — SENNOSIDES-DOCUSATE SODIUM 8.6-50 MG PO TABS: 1.0000 | ORAL_TABLET | Freq: Every evening | ORAL | Status: DC | PRN

## 2017-12-07 MED ORDER — SODIUM CHLORIDE 0.9 % IV SOLN
INTRAVENOUS | Status: DC
  Administered 2017-12-07: 75 mL/h via INTRAVENOUS
  Administered 2017-12-10: 20:00:00 via INTRAVENOUS

## 2017-12-07 MED ORDER — ACETAMINOPHEN 325 MG PO TABS
650.0000 mg | ORAL_TABLET | ORAL | Status: DC | PRN
  Administered 2017-12-07 – 2017-12-11 (×4): 650 mg via ORAL
  Filled 2017-12-07 (×5): qty 2

## 2017-12-07 MED ORDER — IOPAMIDOL (ISOVUE-370) INJECTION 76%
INTRAVENOUS | Status: AC
  Administered 2017-12-07: 90 mL
  Filled 2017-12-07: qty 100

## 2017-12-07 MED ORDER — NICARDIPINE HCL IN NACL 20-0.86 MG/200ML-% IV SOLN: 0.0000 mg/h | INTRAVENOUS | Status: DC

## 2017-12-07 MED ORDER — LABETALOL HCL 5 MG/ML IV SOLN: 20.0000 mg | Freq: Once | INTRAVENOUS | Status: DC

## 2017-12-07 MED ORDER — SODIUM CHLORIDE 0.9 % IV BOLUS (SEPSIS)
500.0000 mL | Freq: Once | INTRAVENOUS | Status: AC
  Administered 2017-12-07: 500 mL via INTRAVENOUS

## 2017-12-07 NOTE — Code Documentation (Signed)
83 year old female presents to [facility] via [place] as code stroke which was called in the field.  Family reports patient was normal this AM - she arose at 0500 and was with her daughter.  They were having breakfast when the patient suddenly tried to stand up and and presented with left side weakness and difficulty speaking at 0915.  On arrival to ED she is alert - has left gaze preference - left side weakness - left field cut with extinction and left side sensory deficit.  Met at the bridge by stroke team and ED team - taken to CT scan.  Post initial scan she began to move the left side some.  Initial NIHSS was 10.  Decision to treat with tPA was made post CTA and CTP after Dr. Rory Percy spoke with patient and family.  tPA needle time 1042.  NIHSS down to 6 - then 3.  Vision returned - left arm without drift - left leg without drift - gaze now crossing midline - remains with slurred speech - left side decreased sensation but not extinction - left facial droop.  Tol tPA - daughters at bedside - continued to update.  Handoff to [employer].  For admission to 4N - neuro ICU.

## 2017-12-07 NOTE — ED Notes (Signed)
CBG: 93. RN notified 

## 2017-12-07 NOTE — Evaluation (Signed)
Clinical/Bedside Swallow Evaluation Patient Details  Name: Molly [name] MRN: [id number] Date of Birth: [birth year]  Today's Date: 12/07/2017 Time: SLP Start Time (ACUTE ONLY): 1433 SLP Stop Time (ACUTE ONLY): 1501 SLP Time Calculation (min) (ACUTE ONLY): 28 min  Past Medical History:  Past Medical History:  Diagnosis Date  . Acid reflux   . Anemia   . Arthritis   . CAD (coronary artery disease)   . High cholesterol   . Hyperlipidemia   . Hypertension   . Osteoporosis   . Spinal stenosis    Past Surgical History:  Past Surgical History:  Procedure Laterality Date  . ABDOMINAL HYSTERECTOMY    . BACK SURGERY     Rod and Pins Placed   . CARDIAC CATHETERIZATION N/A 11/17/2015   Procedure: Left Heart Cath and Coronary Angiography;  Surgeon: Jettie Booze, MD;  Location: [facility];  Service: Cardiovascular;  Laterality: N/A;  . CAROTID STENT     HPI:  Pt is an 83 y.o.femalepast medical history of coronary artery disease, hyperlipidemia, hypertension, atrial fibrillation at some point in the past on no anticoagulation because of resolution of A. fib at that time, presenting to the emergency room with a last known normal of 9:15 AM on 12/07/2017 with left-sided weakness and inability to stand when she wanted to get up from the breakfast table. Per MD notes, pt with cerebral infarction due to embolism of right middle cerebral artery.    Assessment / Plan / Recommendation Clinical Impression  Pt presents with a suspected mild to moderate sensory motor oropharyngeal dysphagia of neurogenic etiology (right cerebrum region). Left sided oral motor deficits noted including decreased left facial sensation and strength and  decreased left lingual ROM and strength resulting in intermittent left anterior spillage, reduced intraoral pressure, and delayed bolus manipulation and coordination. Pt with decreased vocal intensity and decreased volitional cough strength. Pt with s/sx of  suspected decreased airway protection with larger consecutive cup sips of thin liquids as evidenced by immediate and delayed coughing. Nectar thick liquids without overt s/sx of aspiration. Pt with signficantly prolonged mastication of solid PO with oral residuals noted and concern for left buccal pocketting across a meal. Recommend nectar thick liquids with dysphagia 2 (fine chopped) consistencies and medicines whole in puree. ST to follow up for diet tolerance and SLE. Family and pt educated regarding safe swallow precautions and diet recommendations.    SLP Visit Diagnosis: Dysphagia, oropharyngeal phase (R13.12)    Aspiration Risk  Mild aspiration risk;Moderate aspiration risk    Diet Recommendation   Dysphagia 2 (fine chopped) nectar thick liquids   Medication Administration: Whole meds with puree    Other  Recommendations Oral Care Recommendations: Oral care BID Other Recommendations: Order thickener from pharmacy   Follow up Recommendations 24 hour supervision/assistance      Frequency and Duration min 2x/week  1 week       Prognosis Prognosis for Safe Diet Advancement: Good Barriers to Reach Goals: Time post onset      Swallow Study   General Date of Onset: 12/07/17 HPI: Pt is an 83 y.o.femalepast medical history of coronary artery disease, hyperlipidemia, hypertension, atrial fibrillation at some point in the past on no anticoagulation because of resolution of A. fib at that time, presenting to the emergency room with a last known normal of 9:15 AM on 12/07/2017 with left-sided weakness and inability to stand when she wanted to get up from the breakfast table. Per MD notes, pt with cerebral infarction  due to embolism of right middle cerebral artery.  Type of Study: Bedside Swallow Evaluation Diet Prior to this Study: NPO Temperature Spikes Noted: No Respiratory Status: Room air History of Recent Intubation: No Behavior/Cognition: Alert;Requires cueing(slowed  processing) Oral Cavity - Dentition: Dentures, top;Missing dentition Self-Feeding Abilities: Needs assist Patient Positioning: Upright in bed Baseline Vocal Quality: Low vocal intensity Volitional Cough: Weak Volitional Swallow: Able to elicit    Oral/Motor/Sensory Function Overall Oral Motor/Sensory Function: Moderate impairment Facial ROM: Reduced left Facial Symmetry: Abnormal symmetry left Facial Strength: Reduced left Facial Sensation: Reduced left Lingual ROM: Reduced left Lingual Strength: Reduced   Ice Chips Ice chips: Impaired Presentation: Spoon Oral Phase Impairments: Reduced labial seal;Reduced lingual movement/coordination Oral Phase Functional Implications: Prolonged oral transit;Oral residue;Left anterior spillage Pharyngeal Phase Impairments: Suspected delayed Swallow;Multiple swallows   Thin Liquid Thin Liquid: Impaired Presentation: Cup;Spoon;Straw Oral Phase Impairments: Reduced labial seal;Reduced lingual movement/coordination Oral Phase Functional Implications: Left anterior spillage;Prolonged oral transit Pharyngeal  Phase Impairments: Suspected delayed Swallow;Multiple swallows;Cough - Immediate;Cough - Delayed    Nectar Thick Nectar Thick Liquid: Impaired Oral Phase Impairments: Reduced labial seal Oral phase functional implications: Prolonged oral transit Pharyngeal Phase Impairments: Multiple swallows;Cough - Delayed   Honey Thick Honey Thick Liquid: Not tested   Puree Puree: Impaired Presentation: Self Fed Oral Phase Impairments: Reduced labial seal;Reduced lingual movement/coordination Oral Phase Functional Implications: Prolonged oral transit Pharyngeal Phase Impairments: Suspected delayed Swallow;Multiple swallows   Solid   GO   Solid: Impaired Presentation: Self Fed Oral Phase Impairments: Reduced lingual movement/coordination;Impaired mastication Oral Phase Functional Implications: Prolonged oral transit;Impaired mastication;Oral  residue Pharyngeal Phase Impairments: Suspected delayed Swallow;Multiple swallows       Xzavier Swinger MA, CCC-SLP Acute Care Speech Language Pathologist    Keevon Henney E Taytum Scheck 12/07/2017,3:16 PM

## 2017-12-07 NOTE — ED Triage Notes (Signed)
Per [facility], Pt is coming from home where she was eating breakfast with her family. At [facility], pt started drooling crying and having some right sided gaze, Left sided weakness. Vitals per EMS: CBG 117, 169/94, 67 HR, 96%.

## 2017-12-07 NOTE — ED Provider Notes (Signed)
[facility] NEURO/TRAUMA/SURGICAL ICU Provider Note   CSN: [id number] Arrival date & time: 12/07/17  1000  LEVEL 5 CAVEAT - APHASIA   History   Chief Complaint Chief Complaint  Patient presents with  . Code Stroke    HPI Molly [name] is a 83 y.o. female.  HPI  83 year old female brought in by EMS as a code stroke.  At around 9:15 AM she was getting up to stand up on her walker and was otherwise acting normally.  Then had trouble speaking.  Has had on and off trouble speaking and drooling.  The patient has also had some mild left grip strength weakness and mild left arm droop.  She has trouble looking to the left according to EMS.  No prior history of stroke or no known history of A. fib.  Is on a baby aspirin but no other blood thinners.  Is unable to provide further history but does say no when asking headache.  Past Medical History:  Diagnosis Date  . Acid reflux   . Anemia   . Arthritis   . CAD (coronary artery disease)   . High cholesterol   . Hyperlipidemia   . Hypertension   . Osteoporosis   . Spinal stenosis     Patient Active Problem List   Diagnosis Date Noted  . Stroke (cerebrum) ([place]) 12/07/2017  . Accelerating angina ([place])   . Bilateral lower extremity edema 08/15/2015  . Skin thinning 07/23/2015  . S/P lumbar spine operation 07/14/2015  . Deep vein thrombosis ([place]) 06/30/2015  . Reduced mobility 06/19/2015  . Degenerative spondylolisthesis 06/17/2015  . SPL (spondylolisthesis) 06/12/2015  . CN (constipation) 06/04/2015  . Arteriosclerosis of coronary artery 06/04/2015  . Diastolic dysfunction with heart failure ([place]) 06/04/2015  . History of digestive disease 06/04/2015  . HLD (hyperlipidemia) 06/04/2015  . BP (high blood pressure) 06/04/2015  . PONV (postoperative nausea and vomiting) 06/04/2015  . OP (osteoporosis) 06/04/2015  . Lumbar canal stenosis 04/07/2015  . CAD (coronary artery disease) 03/03/2012  . HTN (hypertension) 03/03/2012  .  Hyperlipidemia 03/03/2012  . GERD (gastroesophageal reflux disease) 03/03/2012    Past Surgical History:  Procedure Laterality Date  . ABDOMINAL HYSTERECTOMY    . BACK SURGERY     Rod and Pins Placed   . CARDIAC CATHETERIZATION N/A 11/17/2015   Procedure: Left Heart Cath and Coronary Angiography;  Surgeon: Jettie Booze, MD;  Location: [facility];  Service: Cardiovascular;  Laterality: N/A;  . CAROTID STENT      OB History    No data available       Home Medications    Prior to Admission medications   Medication Sig Start Date End Date Taking? Authorizing Provider  acetaminophen (TYLENOL) 500 MG tablet Take 500 mg by mouth every 8 (eight) hours as needed for mild pain.    Yes [provider]  aspirin EC 81 MG tablet Take 81 mg by mouth every morning.   Yes [provider]  denosumab (PROLIA) 60 MG/ML SOLN injection Inject 60 mg into the skin every 6 (six) months. Administer in upper arm, thigh, or abdomen   Yes [provider]  gabapentin (NEURONTIN) 100 MG capsule Take 100 mg by mouth at bedtime as needed (nerve pain).  02/22/15  Yes [provider]  Glucosamine-Chondroitin (OSTEO BI-FLEX REGULAR STRENGTH PO) Take 1 capsule by mouth 2 (two) times daily.   Yes [provider]  isosorbide mononitrate (IMDUR) 30 MG 24 hr tablet Take 1  tablet (30 mg total) by mouth 2 (two) times daily. 03/23/17  Yes Jettie Booze, MD  metoprolol succinate (TOPROL-XL) 25 MG 24 hr tablet Take 1 tablet (25 mg total) by mouth daily. Patient taking differently: Take 25 mg by mouth every evening.  03/23/17  Yes Jettie Booze, MD  Misc Natural Products (NARCOSOFT HERBAL LAX PO) Take 2 capsules by mouth 2 (two) times daily.    Yes [provider]  nitroGLYCERIN (NITROSTAT) 0.4 MG SL tablet Place 0.4 mg under the tongue every 5 (five) minutes as needed for chest pain (x 3 pills).    Yes [provider]  Omega-3 Fatty Acids  (FISH OIL) 1000 MG CAPS Take 1,000 mg by mouth 2 (two) times daily.   Yes [provider]  omeprazole (PRILOSEC) 40 MG capsule Take 40 mg by mouth daily.   Yes [provider]  polysaccharide iron (NIFEREX) 150 MG CAPS capsule Take 150 mg by mouth every morning.   Yes [provider]  Probiotic Product (ALIGN) 4 MG CAPS Take 1 tablet by mouth every morning.   Yes [provider]  rosuvastatin (CRESTOR) 20 MG tablet Take 20 mg by mouth at bedtime.   Yes [provider]  sennosides-docusate sodium (SENOKOT-S) 8.6-50 MG tablet Take 2 tablets by mouth 2 (two) times daily.    Yes [provider]  TURMERIC PO Take 1 tablet by mouth daily.   Yes [provider]  vitamin C (ASCORBIC ACID) 250 MG tablet Take 250 mg by mouth daily.   Yes [provider]    Family History Family History  Problem Relation Age of Onset  . Anemia Mother   . Heart attack Brother     Social History Social History   Tobacco Use  . Smoking status: Never Smoker  . Smokeless tobacco: Never Used  Substance Use Topics  . Alcohol use: No    Alcohol/week: 0.0 oz  . Drug use: No     Allergies   Buprenorphine hcl; Codeine; Hydrocodone; Morphine and related; Sulfa antibiotics; Sulfacetamide sodium; Vicodin [hydrocodone-acetaminophen]; Evista [raloxifene]; Hydromorphone; Other; Sulfamethoxazole; Sulfamethoxazole-trimethoprim; Sulfur; Tape; and Morphine   Review of Systems Review of Systems  Unable to perform ROS: Patient nonverbal     Physical Exam Updated Vital Signs BP 126/69   Pulse 68   Temp 97.9 F (36.6 C)   Resp (!) 21   Ht 5\' 3"  (1.6 m)   Wt 75.5 kg (166 lb 7.2 oz)   SpO2 100%   BMI 29.48 kg/m   Physical Exam  Constitutional: She appears well-developed and well-nourished. She appears distressed.  HENT:  Head: Normocephalic and atraumatic.  Right Ear: External ear normal.  Left Ear: External ear normal.  Nose: Nose normal.    Occasional drooling/increased secretions but no apparent facial droop.  Eyes: Right eye exhibits no discharge. Left eye exhibits no discharge.  Cardiovascular: Normal rate, regular rhythm and normal heart sounds.  Pulmonary/Chest: Effort normal and breath sounds normal.  Abdominal: Soft. There is no tenderness.  Neurological: She is alert.  Awake, alert, but mental status examination is limited because the patient has such a difficult time speaking.  She appears to have a right-sided gaze preference.  Mild weakness in the left upper extremity although testing is limited.  Skin: Skin is warm and dry.  Nursing note and vitals reviewed.    ED Treatments / Results  Labs (all labs ordered are listed, but only abnormal results are displayed) Labs Reviewed  COMPREHENSIVE  METABOLIC PANEL - Abnormal; Notable for the following components:      Result Value   Glucose, Bld 101 (*)    Alkaline Phosphatase 34 (*)    GFR calc non Af Amer 56 (*)    All other components within normal limits  I-STAT CHEM 8, ED - Abnormal; Notable for the following components:   Calcium, Ion 1.11 (*)    TCO2 21 (*)    All other components within normal limits  MRSA PCR SCREENING  ETHANOL  PROTIME-INR  APTT  CBC  DIFFERENTIAL  RAPID URINE DRUG SCREEN, HOSP PERFORMED  URINALYSIS, ROUTINE W REFLEX MICROSCOPIC  I-STAT TROPONIN, ED  CBG MONITORING, ED    EKG  EKG Interpretation None       Radiology Ct Angio Head W Or Wo Contrast  Result Date: 12/07/2017 CLINICAL DATA:  Left arm weakness.  Aphasia. EXAM: CT ANGIOGRAPHY HEAD AND NECK CT PERFUSION BRAIN TECHNIQUE: Multidetector CT imaging of the head and neck was performed using the standard protocol during bolus administration of intravenous contrast. Multiplanar CT image reconstructions and MIPs were obtained to evaluate the vascular anatomy. Carotid stenosis measurements (when applicable) are obtained utilizing NASCET criteria, using the distal internal  carotid diameter as the denominator. Multiphase CT imaging of the brain was performed following IV bolus contrast injection. Subsequent parametric perfusion maps were calculated using RAPID software. CONTRAST:  58mL ISOVUE-370 IOPAMIDOL (ISOVUE-370) INJECTION 76% COMPARISON:  Noncontrast head CT earlier today. Cervical spine CT 01/03/2014. No prior angiographic imaging. FINDINGS: CTA NECK FINDINGS Aortic arch: Normal variant 4 vessel aortic arch with the left vertebral artery arising from the arch between the left common carotid and left subclavian origins. Moderate arch atherosclerosis. No evidence of significant arch vessel origins stenosis. Right carotid system: Patent without evidence of stenosis or dissection. Mild calcified plaque at the carotid bifurcation. Partially retropharyngeal course of the common and internal carotid arteries. Tortuous proximal common carotid artery. Left carotid system: Patent without evidence of stenosis or dissection. Mild calcified plaque at the carotid bifurcation. Tortuous common carotid artery. Vertebral arteries: Patent without evidence of stenosis or dissection. Non stenotic calcified plaque at the left vertebral artery origin. Moderately dominant right vertebral artery. Skeleton: Grade 1 anterolisthesis of C4 on C5 due to advanced facet arthrosis, with facet ankylosis noted bilaterally. Advanced disc degeneration at C5-6 and C6-7. Interbody ankylosis at C6-7. Other neck: 1.6 cm low-density left thyroid nodule, stable to minimally larger than on the 2015 cervical spine CT. Upper chest: Clear lung apices. Review of the MIP images confirms the above findings CTA HEAD FINDINGS Anterior circulation: The internal carotid arteries are patent from skullbase to carotid termini with mild siphon atherosclerosis bilaterally but no significant stenosis. Both ICAs are slightly ectatic in appearance likely reflecting chronic hypertension. MCAs are patent without evidence of M1 stenosis or  proximal branch occlusion. There is a severe stenosis of a mid right M2 branch vessel. ACAs are patent without evidence of significant proximal stenosis. No aneurysm. Posterior circulation: The intracranial vertebral arteries are patent to the basilar with the right being dominant and mildly ectatic. There is mild bilateral V4 segment atherosclerosis without significant stenosis. PICA origins are not clearly identified. Patent right AICA and bilateral SCA origins are visualized. The basilar artery is widely patent. Posterior communicating arteries are not identified and may be diminutive or absent. PCAs are patent without evidence of significant proximal stenosis. No aneurysm. Venous sinuses: Grossly patent within limitations of arterial phase dominant contrast timing. Anatomic variants: None. Delayed phase:  Not performed. Review of the MIP images confirms the above findings CT Brain Perfusion Findings: CBF (<30%) Volume:  0 mL Perfusion (Tmax>6.0s) volume:  9 mL Mismatch Volume:  9 mL Infarction Location: No core infarct evident by above CBF threshold. Delayed transit in the right frontal operculum with 5 mL volume of tissue demonstrating CBF <38%. IMPRESSION: 1. No emergent large vessel occlusion. 2. Small region of abnormal perfusion in the right frontal operculum without evidence of core infarct utilizing CBF <30% threshold. 3. Severe stenosis of a right MCA mid M2 branch vessel corresponding to the perfusion abnormality. 4. Mild cervical carotid artery atherosclerosis without stenosis. 5.  Aortic Atherosclerosis (ICD10-I70.0). Preliminary findings of no emergent large vessel occlusion were reviewed in person with Dr. Amie Portland on 12/07/2017 at 10:40 am. Electronically Signed   By: Logan Bores M.D.   On: 12/07/2017 11:03   Ct Angio Neck W Or Wo Contrast  Result Date: 12/07/2017 CLINICAL DATA:  Left arm weakness.  Aphasia. EXAM: CT ANGIOGRAPHY HEAD AND NECK CT PERFUSION BRAIN TECHNIQUE: Multidetector CT  imaging of the head and neck was performed using the standard protocol during bolus administration of intravenous contrast. Multiplanar CT image reconstructions and MIPs were obtained to evaluate the vascular anatomy. Carotid stenosis measurements (when applicable) are obtained utilizing NASCET criteria, using the distal internal carotid diameter as the denominator. Multiphase CT imaging of the brain was performed following IV bolus contrast injection. Subsequent parametric perfusion maps were calculated using RAPID software. CONTRAST:  11mL ISOVUE-370 IOPAMIDOL (ISOVUE-370) INJECTION 76% COMPARISON:  Noncontrast head CT earlier today. Cervical spine CT 01/03/2014. No prior angiographic imaging. FINDINGS: CTA NECK FINDINGS Aortic arch: Normal variant 4 vessel aortic arch with the left vertebral artery arising from the arch between the left common carotid and left subclavian origins. Moderate arch atherosclerosis. No evidence of significant arch vessel origins stenosis. Right carotid system: Patent without evidence of stenosis or dissection. Mild calcified plaque at the carotid bifurcation. Partially retropharyngeal course of the common and internal carotid arteries. Tortuous proximal common carotid artery. Left carotid system: Patent without evidence of stenosis or dissection. Mild calcified plaque at the carotid bifurcation. Tortuous common carotid artery. Vertebral arteries: Patent without evidence of stenosis or dissection. Non stenotic calcified plaque at the left vertebral artery origin. Moderately dominant right vertebral artery. Skeleton: Grade 1 anterolisthesis of C4 on C5 due to advanced facet arthrosis, with facet ankylosis noted bilaterally. Advanced disc degeneration at C5-6 and C6-7. Interbody ankylosis at C6-7. Other neck: 1.6 cm low-density left thyroid nodule, stable to minimally larger than on the 2015 cervical spine CT. Upper chest: Clear lung apices. Review of the MIP images confirms the above  findings CTA HEAD FINDINGS Anterior circulation: The internal carotid arteries are patent from skullbase to carotid termini with mild siphon atherosclerosis bilaterally but no significant stenosis. Both ICAs are slightly ectatic in appearance likely reflecting chronic hypertension. MCAs are patent without evidence of M1 stenosis or proximal branch occlusion. There is a severe stenosis of a mid right M2 branch vessel. ACAs are patent without evidence of significant proximal stenosis. No aneurysm. Posterior circulation: The intracranial vertebral arteries are patent to the basilar with the right being dominant and mildly ectatic. There is mild bilateral V4 segment atherosclerosis without significant stenosis. PICA origins are not clearly identified. Patent right AICA and bilateral SCA origins are visualized. The basilar artery is widely patent. Posterior communicating arteries are not identified and may be diminutive or absent. PCAs are patent without evidence of significant proximal  stenosis. No aneurysm. Venous sinuses: Grossly patent within limitations of arterial phase dominant contrast timing. Anatomic variants: None. Delayed phase: Not performed. Review of the MIP images confirms the above findings CT Brain Perfusion Findings: CBF (<30%) Volume:  0 mL Perfusion (Tmax>6.0s) volume:  9 mL Mismatch Volume:  9 mL Infarction Location: No core infarct evident by above CBF threshold. Delayed transit in the right frontal operculum with 5 mL volume of tissue demonstrating CBF <38%. IMPRESSION: 1. No emergent large vessel occlusion. 2. Small region of abnormal perfusion in the right frontal operculum without evidence of core infarct utilizing CBF <30% threshold. 3. Severe stenosis of a right MCA mid M2 branch vessel corresponding to the perfusion abnormality. 4. Mild cervical carotid artery atherosclerosis without stenosis. 5.  Aortic Atherosclerosis (ICD10-I70.0). Preliminary findings of no emergent large vessel  occlusion were reviewed in person with Dr. Amie Portland on 12/07/2017 at 10:40 am. Electronically Signed   By: Logan Bores M.D.   On: 12/07/2017 11:03   Ct Cerebral Perfusion W Contrast  Result Date: 12/07/2017 CLINICAL DATA:  Left arm weakness.  Aphasia. EXAM: CT ANGIOGRAPHY HEAD AND NECK CT PERFUSION BRAIN TECHNIQUE: Multidetector CT imaging of the head and neck was performed using the standard protocol during bolus administration of intravenous contrast. Multiplanar CT image reconstructions and MIPs were obtained to evaluate the vascular anatomy. Carotid stenosis measurements (when applicable) are obtained utilizing NASCET criteria, using the distal internal carotid diameter as the denominator. Multiphase CT imaging of the brain was performed following IV bolus contrast injection. Subsequent parametric perfusion maps were calculated using RAPID software. CONTRAST:  18mL ISOVUE-370 IOPAMIDOL (ISOVUE-370) INJECTION 76% COMPARISON:  Noncontrast head CT earlier today. Cervical spine CT 01/03/2014. No prior angiographic imaging. FINDINGS: CTA NECK FINDINGS Aortic arch: Normal variant 4 vessel aortic arch with the left vertebral artery arising from the arch between the left common carotid and left subclavian origins. Moderate arch atherosclerosis. No evidence of significant arch vessel origins stenosis. Right carotid system: Patent without evidence of stenosis or dissection. Mild calcified plaque at the carotid bifurcation. Partially retropharyngeal course of the common and internal carotid arteries. Tortuous proximal common carotid artery. Left carotid system: Patent without evidence of stenosis or dissection. Mild calcified plaque at the carotid bifurcation. Tortuous common carotid artery. Vertebral arteries: Patent without evidence of stenosis or dissection. Non stenotic calcified plaque at the left vertebral artery origin. Moderately dominant right vertebral artery. Skeleton: Grade 1 anterolisthesis of C4 on  C5 due to advanced facet arthrosis, with facet ankylosis noted bilaterally. Advanced disc degeneration at C5-6 and C6-7. Interbody ankylosis at C6-7. Other neck: 1.6 cm low-density left thyroid nodule, stable to minimally larger than on the 2015 cervical spine CT. Upper chest: Clear lung apices. Review of the MIP images confirms the above findings CTA HEAD FINDINGS Anterior circulation: The internal carotid arteries are patent from skullbase to carotid termini with mild siphon atherosclerosis bilaterally but no significant stenosis. Both ICAs are slightly ectatic in appearance likely reflecting chronic hypertension. MCAs are patent without evidence of M1 stenosis or proximal branch occlusion. There is a severe stenosis of a mid right M2 branch vessel. ACAs are patent without evidence of significant proximal stenosis. No aneurysm. Posterior circulation: The intracranial vertebral arteries are patent to the basilar with the right being dominant and mildly ectatic. There is mild bilateral V4 segment atherosclerosis without significant stenosis. PICA origins are not clearly identified. Patent right AICA and bilateral SCA origins are visualized. The basilar artery is widely patent. Posterior communicating  arteries are not identified and may be diminutive or absent. PCAs are patent without evidence of significant proximal stenosis. No aneurysm. Venous sinuses: Grossly patent within limitations of arterial phase dominant contrast timing. Anatomic variants: None. Delayed phase: Not performed. Review of the MIP images confirms the above findings CT Brain Perfusion Findings: CBF (<30%) Volume:  0 mL Perfusion (Tmax>6.0s) volume:  9 mL Mismatch Volume:  9 mL Infarction Location: No core infarct evident by above CBF threshold. Delayed transit in the right frontal operculum with 5 mL volume of tissue demonstrating CBF <38%. IMPRESSION: 1. No emergent large vessel occlusion. 2. Small region of abnormal perfusion in the right  frontal operculum without evidence of core infarct utilizing CBF <30% threshold. 3. Severe stenosis of a right MCA mid M2 branch vessel corresponding to the perfusion abnormality. 4. Mild cervical carotid artery atherosclerosis without stenosis. 5.  Aortic Atherosclerosis (ICD10-I70.0). Preliminary findings of no emergent large vessel occlusion were reviewed in person with Dr. Amie Portland on 12/07/2017 at 10:40 am. Electronically Signed   By: Logan Bores M.D.   On: 12/07/2017 11:03   Dg Chest Port 1 View  Result Date: 12/07/2017 CLINICAL DATA:  Follow-up stroke.  Weakness and confusion. EXAM: PORTABLE CHEST 1 VIEW COMPARISON:  03/03/2012. FINDINGS: Mediastinum and hilar structures normal. Lungs are clear. No pleural effusion or pneumothorax. Heart size normal. No acute bony abnormality identified. IMPRESSION: No acute cardiopulmonary disease. Electronically Signed   By: Marcello Moores  Register   On: 12/07/2017 13:25   Ct Head Code Stroke Wo Contrast  Result Date: 12/07/2017 CLINICAL DATA:  Code stroke. 83 year old female last known well at 0915 hours. Left arm weakness and drift. Aphasia. EXAM: CT HEAD WITHOUT CONTRAST TECHNIQUE: Contiguous axial images were obtained from the base of the skull through the vertex without intravenous contrast. COMPARISON:  Head and cervical spine CT 01/03/2014. FINDINGS: Brain: Cavum septum pellucidum normal variant. Cerebral volume is not significantly changed since 2015. No ventriculomegaly. No acute intracranial hemorrhage identified. No midline shift, mass effect, or evidence of intracranial mass lesion. Gray-white matter differentiation is stable and normal throughout the brain. No cortically based acute infarct identified. No cortical encephalomalacia. Vascular: Calcified atherosclerosis at the skull base. No suspicious intracranial vascular hyperdensity. Skull: No acute osseous abnormality identified. Sinuses/Orbits: Clear. Other: No acute orbit or scalp soft tissue  findings. Postoperative changes to both globes. ASPECTS [facility] Stroke Program Early CT Score) - Ganglionic level infarction (caudate, lentiform nuclei, internal capsule, insula, M1-M3 cortex): 7 - Supraganglionic infarction (M4-M6 cortex): 3 Total score (0-10 with 10 being normal): 10 IMPRESSION: 1. Noncontrast CT appearance of the brain is stable since 2015 and normal for age. 2. ASPECTS is 10. 3. These results were communicated to Dr. Rory Percy at 1023 hours on 12/07/2017 by text page via the [facility] messaging system. Electronically Signed   By: Genevie Ann M.D.   On: 12/07/2017 10:23    Procedures .Critical Care Performed by: Sherwood Gambler, MD Authorized by: Sherwood Gambler, MD   Critical care provider statement:    Critical care time (minutes):  30   Critical care was necessary to treat or prevent imminent or life-threatening deterioration of the following conditions:  CNS failure or compromise   Critical care was time spent personally by me on the following activities:  Development of treatment plan with patient or surrogate, discussions with consultants, evaluation of patient's response to treatment, examination of patient, ordering and performing treatments and interventions, ordering and review of laboratory studies, ordering and review of radiographic  studies, pulse oximetry and re-evaluation of patient's condition   (including critical care time)  Medications Ordered in ED Medications  0.9 %  sodium chloride infusion ( Intravenous Rate/Dose Verify 12/07/17 1400)  acetaminophen (TYLENOL) tablet 650 mg (not administered)    Or  acetaminophen (TYLENOL) solution 650 mg (not administered)    Or  acetaminophen (TYLENOL) suppository 650 mg (not administered)  senna-docusate (Senokot-S) tablet 1 tablet (not administered)  labetalol (NORMODYNE,TRANDATE) injection 20 mg (not administered)    And  nicardipine (CARDENE) 20mg  in 0.86% saline 248ml IV infusion (0.1 mg/ml) (not administered)    iopamidol (ISOVUE-370) 76 % injection (90 mLs  Contrast Given 12/07/17 1030)  alteplase (ACTIVASE) 1 mg/mL infusion 67 mg (0 mg/kg  74.9 kg Intravenous Stopped 12/07/17 1142)   stroke: mapping our early stages of recovery book ( Does not apply Given 12/07/17 1456)     Initial Impression / Assessment and Plan / ED Course  I have reviewed the triage vital signs and the nursing notes.  Pertinent labs & imaging results that were available during my care of the patient were reviewed by me and considered in my medical decision making (see chart for details).     Given acuity of strokelike symptoms and no other clear diagnosis, neurology has decided to give TPA.  Patient has started having some improvement in the ED.  She has been protecting her airway the entire time.  She will be admitted to the ICU for close neuro monitoring.  Final Clinical Impressions(s) / ED Diagnoses   Final diagnoses:  Stroke (cerebrum) [facility])    ED Discharge Orders    None       Sherwood Gambler, MD 12/07/17 1542

## 2017-12-07 NOTE — H&P (Signed)
Neurology Consultation  Reason for Consult: Acute code stroke Referring Physician: Dr. Regenia Skeeter  CC: Left-sided weakness, speech problems  History is obtained from: Patient, daughters  HPI: Molly [name] is a 83 y.o. female past medical history of coronary artery disease, hyperlipidemia, hypertension, atrial fibrillation at some point in the past on no anticoagulation because of resolution of A. fib at that time, presenting to the emergency room with a last known normal of 9:15 AM on 12/07/2017 with left-sided weakness and inability to stand when she wanted to get up from the breakfast table. Confirmed last seen normal by the daughter that she lives with at home at 9:15 AM. Left-sided weakness and inability to walk.  911 was called.  On their examination, she had mild left-sided drift, rightward gaze and some a aphasia or dysarthria. Patient has had no recent surgeries.  She had some problems with a spinal surgery postoperative bleeding and was unable to be anticoagulated and an IVC filter had to be placed for a DVT PE at that time. No current bleeding diathesis reported. Not on a blood thinner at this time other than a antiplatelet baby aspirin.   LKW: 0915 hrs. on 12/07/17 tpa given?:  Yes Premorbid modified Rankin scale (mRS): 2   ROS: A 14 point ROS was performed and is negative except as noted in the HPI.    Past Medical History:  Diagnosis Date  . Acid reflux   . Anemia   . Arthritis   . CAD (coronary artery disease)   . High cholesterol   . Hyperlipidemia   . Hypertension   . Osteoporosis   . Spinal stenosis     Family History  Problem Relation Age of Onset  . Anemia Mother   . Heart attack Brother     Social History:   reports that  has never smoked. she has never used smokeless tobacco. She reports that she does not drink alcohol or use drugs.  Medications  Current Facility-Administered Medications:  .   stroke: mapping our early stages of recovery  book, , Does not apply, Once, Amie Portland, MD .  0.9 %  sodium chloride infusion, , Intravenous, Continuous, Amie Portland, MD .  acetaminophen (TYLENOL) tablet 650 mg, 650 mg, Oral, Q4H PRN **OR** acetaminophen (TYLENOL) solution 650 mg, 650 mg, Per Tube, Q4H PRN **OR** acetaminophen (TYLENOL) suppository 650 mg, 650 mg, Rectal, Q4H PRN, Amie Portland, MD .  alteplase (ACTIVASE) 1 mg/mL infusion 67 mg, 0.9 mg/kg, Intravenous, Once, Amie Portland, MD .  senna-docusate (Senokot-S) tablet 1 tablet, 1 tablet, Oral, QHS PRN, Amie Portland, MD  Current Outpatient Medications:  .  acetaminophen (TYLENOL) 500 MG tablet, Take 500 mg by mouth every 8 (eight) hours as needed. , Disp: , Rfl:  .  aspirin EC 81 MG tablet, Take 81 mg by mouth every morning., Disp: , Rfl:  .  bisacodyl (DULCOLAX) 10 MG suppository, Place 10 mg rectally daily as needed for moderate constipation (Averages every other day)., Disp: , Rfl:  .  denosumab (PROLIA) 60 MG/ML SOLN injection, Inject 60 mg into the skin every 6 (six) months. Administer in upper arm, thigh, or abdomen, Disp: , Rfl:  .  esomeprazole (NEXIUM) 40 MG capsule, Take 40 mg by mouth daily. Takes 1 time in the morning, and take another at night if needed., Disp: , Rfl:  .  gabapentin (NEURONTIN) 100 MG capsule, Take 100 mg by mouth daily as needed. , Disp: , Rfl:  .  Glucosamine-Chondroitin (OSTEO BI-FLEX REGULAR  STRENGTH PO), Take 1 capsule by mouth 2 (two) times daily., Disp: , Rfl:  .  isosorbide mononitrate (IMDUR) 30 MG 24 hr tablet, Take 1 tablet (30 mg total) by mouth 2 (two) times daily., Disp: 180 tablet, Rfl: 3 .  metoprolol succinate (TOPROL-XL) 25 MG 24 hr tablet, Take 1 tablet (25 mg total) by mouth daily., Disp: 90 tablet, Rfl: 3 .  Misc Natural Products (NARCOSOFT HERBAL LAX PO), Take 1 capsule by mouth 2 (two) times daily. , Disp: , Rfl:  .  nitroGLYCERIN (NITROSTAT) 0.4 MG SL tablet, Place 0.4 mg under the tongue every 5 (five) minutes as needed for  chest pain (x 3 pills). , Disp: , Rfl:  .  polysaccharide iron (NIFEREX) 150 MG CAPS capsule, Take 150 mg by mouth every morning., Disp: , Rfl:  .  Probiotic Product (ALIGN) 4 MG CAPS, Take 1 tablet by mouth every morning., Disp: , Rfl:  .  rosuvastatin (CRESTOR) 20 MG tablet, Take 20 mg by mouth at bedtime., Disp: , Rfl:  .  sennosides-docusate sodium (SENOKOT-S) 8.6-50 MG tablet, Take 3 tablets by mouth 2 (two) times daily., Disp: , Rfl:  .  Turmeric 500 MG CAPS, Take by mouth 4 (four) times daily., Disp: , Rfl:  .  vitamin C (ASCORBIC ACID) 250 MG tablet, Take 250 mg by mouth daily., Disp: , Rfl:   Exam: Current vital signs: Wt 74.9 kg (165 lb 2 oz)   BMI 30.20 kg/m   Vital signs in last 24 hours: Weight:  [74.9 kg (165 lb 2 oz)] 74.9 kg (165 lb 2 oz) (12/26 1000) General: Awake alert in no apparent distress H ENT: Normocephalic atraumatic dry mucous membranes Lungs clear to auscultation Cardia vascular: S1-S2 heard, regular rate rhythm Abdomen soft nondistended nontender Extremities warm well perfused with intact peripheral pulses Neurological exam Patient appears worried, answers some questions. Follows all commands. Her speech is dysarthric.  She is slow to respond to all questions. Cranial nerves: Pupils equal round reactive to light, she has a right gaze preference and unable to cross midline, left homonymous hemianopsia, left facial droop, decreased sensation on the left side of the face, palate elevates symmetrically, auditory acuity grossly intact to voice, shoulder shrug intact, tongue midline. Motor exam: She is able to lift all 4 extremities antigravity with a minimal vertical drift in the left upper extremity. Sensory exam: Profound sensory loss on the left side.  Sensory neglect on the left.  Inconsistent extinction exam Coordination: Slow to perform but intact finger nose finger on the right, mildly ataxic on the left. Gait was not tested for patient safety. NIHSS 1a  Level of Conscious.: 0 1b LOC Questions: 0 1c LOC Commands: 0 2 Best Gaze: 1 3 Visual: 2 4 Facial Palsy: 1 5a Motor Arm - left: 1 5b Motor Arm - Right: 0 6a Motor Leg - Left: 0 6b Motor Leg - Right: 0 7 Limb Ataxia: 0 8 Sensory: 2 9 Best Language: 0 10 Dysarthria: 1 11 Extinct. and Inatten.: 2 TOTAL: 10  Labs I have reviewed labs in epic and the results pertinent to this consultation are:   CBC    Component Value Date/Time   WBC 6.1 12/07/2017 1012   RBC 4.01 12/07/2017 1012   HGB 12.6 12/07/2017 1021   HCT 37.0 12/07/2017 1021   PLT 172 12/07/2017 1012   MCV 96.5 12/07/2017 1012   MCH 32.7 12/07/2017 1012   MCHC 33.9 12/07/2017 1012   RDW 13.5 12/07/2017 1012  LYMPHSABS 2.9 12/07/2017 1012   MONOABS 0.4 12/07/2017 1012   EOSABS 0.1 12/07/2017 1012   BASOSABS 0.0 12/07/2017 1012    CMP     Component Value Date/Time   NA 140 12/07/2017 1021   K 4.4 12/07/2017 1021   CL 108 12/07/2017 1021   CO2 27 11/14/2015 1507   GLUCOSE 96 12/07/2017 1021   BUN 14 12/07/2017 1021   CREATININE 0.90 12/07/2017 1021   CREATININE 0.82 11/14/2015 1507   CALCIUM 9.6 11/14/2015 1507   PROT 6.6 11/18/2014 1002   ALBUMIN 3.9 11/18/2014 1002   AST 25 11/18/2014 1002   ALT 19 11/18/2014 1002   ALKPHOS 26 (L) 11/18/2014 1002   BILITOT 0.5 11/18/2014 1002   GFRNONAA 65 (L) 01/03/2014 1330   GFRAA 75 (L) 01/03/2014 1330    Imaging I have reviewed the images obtained:  CT-scan of the brain -no acute change.  Aspects 10 CT Angie of head and neck no large vessel occlusion. CT perfusion study of the brain shows a small area of penumbra in the right MCA territory with no core  Assessment: 83 year old woman with a past history of coronary artery disease, hyperlipidemia, hypertension atrial fibrillation at some point in the past on no anticoagulation because of resolution of A. fib and some concern for bleeding, presented with acute onset of left-sided weakness, dysarthria, left  facial droop and left-sided neglect. Symptoms consistent with a right MCA syndrome. Noncontrast CT of the head negative for any acute bleed. CT angiogram head and neck did not show any large vessel occlusion.  Hence not an endovascular candidate. CT perfusion study did show a small area of deficit-perfusion mismatch of 9 cc in the right MCA territory. After discussing with the family the risks and benefits of TPA, since they were concerned about her past bleeding perioperatively after back surgery, decision was made to use IV TPA given that this is a disabling stroke. 1 of the daughter that she lives with and the other daughter who is a Marine scientist were both present and I answered all their questions. Most likely acute ischemic stroke involving the right MCA territory-cardio embolic versus atheroembolic etiology.   Impression: Acute Ischemic Stroke Cerebral infarction due to embolism of right middle cerebral artery Acuity: Acute Current Suspected Etiology: Cardio embolic/atheroembolic Continue Evaluation:  -Admit to: Neurological ICU -Hold Aspirin until 24 hour post tPA neuroimaging is stable and without evidence of bleeding -Blood pressure control, goal of SYS <180 -MRI/ECHO/A1C/Lipid panel. -Hyperglycemia management per SSI to maintain glucose 140-180mg /dL. -PT/OT/ST therapies and recommendations when able  CNS -Close neuro monitoring  Dysarthria Dysphagia following cerebral infarction  -NPO until cleared by speech -ST -Advance diet as tolerated  Hemiplegia and hemiparesis following cerebral infarction affecting left non-dominant side  -PT/OT    RESP No acute issues. Chest x-ray 2 rule out aspiration pneumonia  CV Essential (primary) hypertension -Aggressive BP control, goal SBP < 180 -Use Cardene if needed -TTE  Hyperlipidemia, unspecified  - Statin for goal LDL < 70  Evaluate for paroxysmal atrial fibrillation  HEME -Monitor -transfuse for hgb < 7  ENDO -goal  HgbA1c < 7  GI/GU -Gentle hydration  ID Possible Aspiration PNA -CXR -NPO -Monitor  Prophylaxis DVT: SCDs GI: Not applicable Bowel: Doc/senna  Diet: NPO until cleared by speech  Code Status: Full Code    THE FOLLOWING WERE PRESENT ON ADMISSION: CNS -acute ischemic stroke, hemiparesis, dysphagia   Amie Portland, MD [facility] [phone] If 7pm to 7am, please call on call  as listed on AMION.    CRITICAL CARE Performed by: Amie Portland Total critical care time: 60 minutes Critical care time was exclusive of separately billable procedures and treating other patients. Critical care was necessary to treat or prevent imminent or life-threatening deterioration. Critical care was time spent personally by me on the following activities: development of treatment plan with patient and/or surrogate as well as nursing, discussions with consultants, evaluation of patient's response to treatment, examination of patient, obtaining history from patient or surrogate, ordering and performing treatments and interventions, ordering and review of laboratory studies, ordering and review of radiographic studies, pulse oximetry and re-evaluation of patient's condition.

## 2017-12-07 NOTE — Progress Notes (Signed)
  Echocardiogram 2D Echocardiogram has been performed.  Jennette Dubin 12/07/2017, 4:03 PM

## 2017-12-07 NOTE — Progress Notes (Signed)
Pharmacist Code Stroke Response  Notified to mix tPA at 1021 by Dr. Rory Percy Delivered tPA to RN at 1026  Issues/delays encountered (if applicable): Slight delay in tPA administration due to patient's history. When family arrived to hospital they noted that patient had bleeding with a spinal surgery two years ago. MD needed to speak with family re risks/benefits and with IR before proceeding.   Molly [name] 12/07/17 11:22 AM

## 2017-12-07 NOTE — Progress Notes (Signed)
PT Cancellation Note  Patient Details Name: Molly [name] MRN: [id number] DOB: [birth year]   Cancelled Treatment:    Reason Eval/Treat Not Completed: Patient not medically ready   Duncan Dull 12/07/2017, 3:17 PM

## 2017-12-08 ENCOUNTER — Inpatient Hospital Stay (HOSPITAL_COMMUNITY): Payer: Medicare Other

## 2017-12-08 DIAGNOSIS — R269 Unspecified abnormalities of gait and mobility: Secondary | ICD-10-CM

## 2017-12-08 DIAGNOSIS — I6300 Cerebral infarction due to thrombosis of unspecified precerebral artery: Secondary | ICD-10-CM

## 2017-12-08 DIAGNOSIS — M5416 Radiculopathy, lumbar region: Secondary | ICD-10-CM

## 2017-12-08 LAB — LIPID PANEL
Cholesterol: 133 mg/dL (ref 0–200)
HDL: 63 mg/dL (ref >40)
LDL CALC: 50 mg/dL (ref 0–99)
Total CHOL/HDL Ratio: 2.1 RATIO
Triglycerides: 98 mg/dL (ref <150)
VLDL: 20 mg/dL (ref 0–40)

## 2017-12-08 LAB — CBC
HEMATOCRIT: 34 % — AB (ref 36.0–46.0)
Hemoglobin: 11.2 g/dL — ABNORMAL LOW (ref 12.0–15.0)
MCH: 31.5 pg (ref 26.0–34.0)
MCHC: 32.9 g/dL (ref 30.0–36.0)
MCV: 95.8 fL (ref 78.0–100.0)
Platelets: 149 10*3/uL — ABNORMAL LOW (ref 150–400)
RBC: 3.55 MIL/uL — AB (ref 3.87–5.11)
RDW: 13.1 % (ref 11.5–15.5)
WBC: 4.8 10*3/uL (ref 4.0–10.5)

## 2017-12-08 LAB — HEMOGLOBIN A1C
HEMOGLOBIN A1C: 5.7 % — AB (ref 4.8–5.6)
Mean Plasma Glucose: 116.89 mg/dL

## 2017-12-08 MED ORDER — METOPROLOL SUCCINATE ER 25 MG PO TB24
25.0000 mg | ORAL_TABLET | Freq: Every day | ORAL | Status: DC
  Administered 2017-12-08 – 2017-12-12 (×4): 25 mg via ORAL
  Filled 2017-12-08 (×5): qty 1

## 2017-12-08 MED ORDER — RESOURCE THICKENUP CLEAR PO POWD
ORAL | Status: DC | PRN
  Filled 2017-12-08: qty 125

## 2017-12-08 MED ORDER — SODIUM CHLORIDE 0.9 % IV BOLUS (SEPSIS)
500.0000 mL | Freq: Once | INTRAVENOUS | Status: AC
  Administered 2017-12-08: 500 mL via INTRAVENOUS

## 2017-12-08 MED ORDER — ROSUVASTATIN CALCIUM 20 MG PO TABS
20.0000 mg | ORAL_TABLET | Freq: Every day | ORAL | Status: DC
  Administered 2017-12-08 – 2017-12-11 (×4): 20 mg via ORAL
  Filled 2017-12-08 (×5): qty 1

## 2017-12-08 NOTE — Progress Notes (Signed)
Inpatient Rehabilitation  Please see consult by Dr. Letta Pate for full details.  Patient appears to have returned to her baseline.  Plan to follow briefly for neurology re-evaluation, if nothing new then recommend home health follow up.  Call if questions.   Carmelia Roller., CCC/SLP Admission Coordinator  [place]  Cell [phone]

## 2017-12-08 NOTE — Progress Notes (Signed)
Called MRI to schedule scan, ordered for after 0000. MRI unable to take the pt at this time and 24hrs post tPA is not until 12/08/2017 @ 1042.   Will attempt again to get pt down to MRI tonight.  Guadalupe Maple, RN

## 2017-12-08 NOTE — Progress Notes (Signed)
Notified on-call neurologist twice throughout the night regarding pt's low BP. During episodes of hypotension - BP and neuro status reassessed - no changes during those times. MD ordered a 524mL bolus for each episode as well as a stat CBC for assessment of blood counts.   Will continue to monitor closely.  Guadalupe Maple, RN

## 2017-12-08 NOTE — Evaluation (Signed)
Physical Therapy Evaluation Patient Details Name: Molly [name] MRN: [id number] DOB: Oct 18, 1929 Today's Date: 12/08/2017   History of Present Illness  Molly [name] is a 83 y.o. female PHMx: of CAD, hyperlipidemia, HTN, A-fib, vertigo, back sx a couple of years ago with continued RLE weakness and foot drop presenting to the emergency room with left-sided weakness and inability to stand. TPA was given. CT negative, awaiting MRI  Clinical Impression  Pt was able to stand EOB and take some side steps min to mod assist overall with RW.  She can tolerate and would benefit from intensive inpatient rehab at discharge to help her return to her prior level of function.  She has excellent family support.   PT to follow acutely for deficits listed below.     Follow Up Recommendations CIR    Equipment Recommendations  None recommended by PT    Recommendations for Other Services Rehab consult     Precautions / Restrictions Precautions Precautions: Fall Required Braces or Orthoses: Other Brace/Splint Other Brace/Splint: pt has an AFO at home for her right leg, but it is too heavy for her to use.  Restrictions Weight Bearing Restrictions: No      Mobility  Bed Mobility Overal bed mobility: Needs Assistance Bed Mobility: Rolling;Supine to Sit Rolling: Mod assist   Supine to sit: Min assist;HOB elevated Sit to supine: Mod assist   General bed mobility comments: Mod assist to roll bil to her side to donn underwear and pad.  Min assist from maximally elevated HOB to get to sitting, pt using both hands on bed rail and needs light assist to move bil legs to EOB, min assist at trunk to get to sitting.  To return to supine mod assist at trunk and bil legs to lift back into flat bed.   Transfers Overall transfer level: Needs assistance Equipment used: Rolling walker (2 wheeled) Transfers: Sit to/from Stand Sit to Stand: Min assist;From elevated surface         General transfer  comment: Min assist using momentum with right foot blocked from sliding forward.    Ambulation/Gait Ambulation/Gait assistance: Min assist Ambulation Distance (Feet): 5 Feet Assistive device: Rolling walker (2 wheeled) Gait Pattern/deviations: Step-to pattern     General Gait Details: side step up to [facility] from base of bed.  Min assist for balance due to posterior preference.  Assist needed for balance.  Pt dragging right foot and leg over as she side steps.    Modified Rankin (Stroke Patients Only) Modified Rankin (Stroke Patients Only) Pre-Morbid Rankin Score: Moderately severe disability Modified Rankin: Moderately severe disability     Balance Overall balance assessment: Needs assistance Sitting-balance support: Bilateral upper extremity supported;Feet supported Sitting balance-Leahy Scale: Fair Sitting balance - Comments: close supervision EOB   Standing balance support: Bilateral upper extremity supported Standing balance-Leahy Scale: Poor Standing balance comment: needs assist and support of RW                             Pertinent Vitals/Pain Pain Assessment: Faces Faces Pain Scale: Hurts little more Pain Location: generalized, arms, IV site Pain Descriptors / Indicators: Grimacing;Guarding Pain Intervention(s): Limited activity within patient's tolerance;Monitored during session;Repositioned    Home Living Family/patient expects to be discharged to:: Inpatient rehab Living Arrangements: Children Available Help at Discharge: Family;Available 24 hours/day Type of Home: House Home Access: Ramped entrance     Home Layout: One level Home Equipment: Walker - 4 wheels;Bedside commode;Shower  seat;Hand held shower head Additional Comments: Pt has also has hired A that comes in 4 days a week and helps with basic ADLs    Prior Function Level of Independence: Needs assistance   Gait / Transfers Assistance Needed: Uses a rollator with someone always close by.  Pt and dtr both report that pt does normally move slow due to h/o vertigo (but now moving even slower)  ADL's / Homemaking Assistance Needed: Does some of her UB ADLs and has A with [facility] ADLS        Hand Dominance   Dominant Hand: Right    Extremity/Trunk Assessment   Upper Extremity Assessment Upper Extremity Assessment: Defer to OT evaluation RUE Deficits / Details: generalized weakness LUE Deficits / Details: Can move arm for movements asked but quite slow compared to RUE    Lower Extremity Assessment Lower Extremity Assessment: RLE deficits/detail;LLE deficits/detail RLE Deficits / Details: right leg with baseline weakness from back surgery.  R foot drop DF 0/5, knee extension 3-/5, hip flexion 3-/5 LLE Deficits / Details: left leg with generalized weakness ankle DF 3/5, knee extension 3/5, hip flexion 3/5 per seated MMT    Cervical / Trunk Assessment Cervical / Trunk Assessment: Kyphotic  Communication   Communication: No difficulties  Cognition Arousal/Alertness: Awake/alert Behavior During Therapy: WFL for tasks assessed/performed Overall Cognitive Status: Within Functional Limits for tasks assessed                                 General Comments: Not specifically tested, for tasks assessed she was able to follow basic commands and communicate her needs             Assessment/Plan    PT Assessment Patient needs continued PT services  PT Problem List Decreased strength;Decreased activity tolerance;Decreased balance;Decreased mobility;Decreased knowledge of use of DME       PT Treatment Interventions DME instruction;Gait training;Functional mobility training;Therapeutic activities;Therapeutic exercise;Balance training;Neuromuscular re-education;Patient/family education    PT Goals (Current goals can be found in the Care Plan section)  Acute Rehab PT Goals Patient Stated Goal: family would like CIR PT Goal Formulation: With patient/family Time  For Goal Achievement: 12/22/17 Potential to Achieve Goals: Good    Frequency Min 4X/week    AM-PAC PT "6 Clicks" Daily Activity  Outcome Measure Difficulty turning over in bed (including adjusting bedclothes, sheets and blankets)?: Unable Difficulty moving from lying on back to sitting on the side of the bed? : Unable Difficulty sitting down on and standing up from a chair with arms (e.g., wheelchair, bedside commode, etc,.)?: Unable Help needed moving to and from a bed to chair (including a wheelchair)?: A Little Help needed walking in hospital room?: A Little Help needed climbing 3-5 steps with a railing? : A Lot 6 Click Score: 11    End of Session Equipment Utilized During Treatment: Gait belt Activity Tolerance: Patient limited by pain;Patient limited by fatigue Patient left: in bed;in CPM;with family/visitor present Nurse Communication: Mobility status PT Visit Diagnosis: Muscle weakness (generalized) (M62.81);Difficulty in walking, not elsewhere classified (R26.2);Other symptoms and signs involving the nervous system (U72.536)    Time: 1010-1033 PT Time Calculation (min) (ACUTE ONLY): 23 min   Charges:        Wells Guiles B. Kali Ambler, PT, DPT [phone]   PT Evaluation $PT Eval Moderate Complexity: 1 Mod PT Treatments $Therapeutic Activity: 8-22 mins   12/08/2017, 10:43 AM

## 2017-12-08 NOTE — Progress Notes (Signed)
Inpatient Rehabilitation  Patient was screened by Gunnar Fusi for appropriateness for an Inpatient Acute Rehab consult.  At this time we are recommending an Inpatient Rehab consult and note that one has been ordered.  Please plan for an Admission's Coordinator to follow up after the consult has been completed.  Call if questions.   Carmelia Roller., CCC/SLP Admission Coordinator  [place]  Cell [phone]

## 2017-12-08 NOTE — Progress Notes (Signed)
NEUROHOSPITALISTS STROKE TEAM - DAILY PROGRESS NOTE   ADMISSION HISTORY: Molly [name] is a 83 y.o. female past medical history of coronary artery disease, hyperlipidemia, hypertension, atrial fibrillation at some point in the past on no anticoagulation because of resolution of A. fib at that time, presenting to the emergency room with a last known normal of 9:15 AM on 12/07/2017 with left-sided weakness and inability to stand when she wanted to get up from the breakfast table. Confirmed last seen normal by the daughter that she lives with at home at 9:15 AM. Left-sided weakness and inability to walk.  911 was called.  On their examination, she had mild left-sided drift, rightward gaze and some a aphasia or dysarthria. Patient has had no recent surgeries.  She had some problems with a spinal surgery postoperative bleeding and was unable to be anticoagulated and an IVC filter had to be placed for a DVT PE at that time. No current bleeding diathesis reported. Not on a blood thinner at this time other than a antiplatelet baby aspirin.  LKW: 0915 hrs. on 12/07/17 tpa given?:  Yes Premorbid modified Rankin scale (mRS): 2 NIHSS 1a Level of Conscious.: 0 1b LOC Questions: 0 1c LOC Commands: 0 2 Best Gaze: 1 3 Visual: 2 4 Facial Palsy: 1 5a Motor Arm - left: 1 5b Motor Arm - Right: 0 6a Motor Leg - Left: 0 6b Motor Leg - Right: 0 7 Limb Ataxia: 0 8 Sensory: 2 9 Best Language: 0 10 Dysarthria: 1 11 Extinct. and Inatten.: 2 TOTAL: 10  SUBJECTIVE (INTERVAL HISTORY)  Daughter is at the bedside. Patient is found laying in bed in NAD. Overall she feels her condition is gradually improving. Voices no new complaints.  2 episodes of hypotension reported overnight.  Recovered after IV bolus.  No further episodes reported.  OBJECTIVE Lab Results: CBC:  Recent Labs  Lab 12/07/17 1012 12/07/17 1021 12/08/17 0309  WBC 6.1  --  4.8  HGB  13.1 12.6 11.2*  HCT 38.7 37.0 34.0*  MCV 96.5  --  95.8  PLT 172  --  149*   BMP: Recent Labs  Lab 12/07/17 1012 12/07/17 1021  NA 140 140  K 4.5 4.4  CL 107 108  CO2 24  --   GLUCOSE 101* 96  BUN 13 14  CREATININE 0.91 0.90  CALCIUM 9.5  --    Liver Function Tests:  Recent Labs  Lab 12/07/17 1012  AST 36  ALT 24  ALKPHOS 34*  BILITOT 0.7  PROT 7.0  ALBUMIN 3.9   Coagulation Studies:  Recent Labs    12/07/17 1012  APTT 31  INR 1.01    Urinalysis:  Recent Labs  Lab 12/07/17 1150  COLORURINE YELLOW  APPEARANCEUR CLEAR  LABSPEC 1.025  PHURINE 6.0  GLUCOSEU NEGATIVE  HGBUR NEGATIVE  BILIRUBINUR NEGATIVE  KETONESUR NEGATIVE  PROTEINUR NEGATIVE  NITRITE NEGATIVE  LEUKOCYTESUR NEGATIVE   Urine Drug Screen:     Component Value Date/Time   LABOPIA NONE DETECTED 12/07/2017 1150   COCAINSCRNUR NONE DETECTED 12/07/2017 1150   LABBENZ NONE DETECTED 12/07/2017 1150   AMPHETMU NONE DETECTED 12/07/2017 1150   THCU NONE DETECTED 12/07/2017 1150   LABBARB NONE DETECTED 12/07/2017 1150    Alcohol Level:  Recent Labs  Lab 12/07/17 1012  ETH <10   PHYSICAL EXAM Temp:  [98.2 F (36.8 C)-98.5 F (36.9 C)] 98.4 F (36.9 C) (12/27 1600) Pulse Rate:  [58-72] 65 (12/27 1600) Resp:  [12-23] 16 (12/27  1600) BP: (86-160)/(43-74) 156/74 (12/27 1600) SpO2:  [96 %-100 %] 100 % (12/27 1600) General - Well nourished, well developed, in no apparent distress Respiratory - Lungs clear bilaterally. No wheezing. Cardiovascular - Regular rate and rhythm  Neurological exam Patient appears worried, answers some questions. Follows all commands. Her speech is dysarthric.  She is slow to respond to all questions. Cranial nerves: Pupils equal round reactive to light, she has a right gaze preference and unable to cross midline, left homonymous hemianopsia, left facial droop, decreased sensation on the left side of the face, palate elevates symmetrically, auditory acuity grossly  intact to voice, shoulder shrug intact, tongue midline. Motor exam: She is able to lift all 4 extremities antigravity with a minimal vertical drift in the left upper extremity. Sensory exam: Profound sensory loss on the left side.  Sensory neglect on the left.  Inconsistent extinction exam Coordination: Slow to perform but intact finger nose finger on the right, mildly ataxic on the left. Gait was not tested for patient safety.  IMAGING: I have personally reviewed the radiological images below and agree with the radiology interpretations.  Ct Angio Head, Perfusion and Neck W Or Wo Contrast Result Date: 12/07/2017 IMPRESSION: 1. No emergent large vessel occlusion. 2. Small region of abnormal perfusion in the right frontal operculum without evidence of core infarct utilizing CBF <30% threshold. 3. Severe stenosis of a right MCA mid M2 branch vessel corresponding to the perfusion abnormality. 4. Mild cervical carotid artery atherosclerosis without stenosis. 5.  Aortic Atherosclerosis (ICD10-I70.0).   Mr Brain Wo Contrast Result Date: 12/08/2017 IMPRESSION: Negative noncontrast MRI of the head for age.   Dg Chest Port 1 View Result Date: 12/07/2017 IMPRESSION: No acute cardiopulmonary disease.   Ct Head Code Stroke Wo Contrast Result Date: 12/07/2017 IMPRESSION: 1. Noncontrast CT appearance of the brain is stable since 2015 and normal for age. 2. ASPECTS is 10.    Echocardiogram:  Study Conclusions - Left ventricle: The cavity size was normal. Wall thickness was   normal. Systolic function was normal. The estimated ejection   fraction was in the range of 55% to 60%. Wall motion was normal;   there were no regional wall motion abnormalities. Doppler   parameters are consistent with abnormal left ventricular   relaxation (grade 1 diastolic dysfunction). - Aortic valve: There was mild regurgitation. - Mitral valve: Calcified annulus. Impressions: - Normal LV systolic function; mild  diastolic dysfunction; mild AI;   trace MR and TR.     ASSESSMENT: Molly [name] is a 83 y.o. female with PMH of coronary artery disease, hyperlipidemia, hypertension, atrial fibrillation on no anticoagulation presenting to the emergency room with a last known normal of 9:15 AM on 12/07/2017 with left-sided weakness and inability to stand. NIHSS 10. CT-scan of the brain -no acute change.  Aspects 10.  CT Angie of head and neck no large vessel occlusion. CT perfusion study of the brain shows a small area of penumbra in the right MCA territory with no core. IV TPA given   STROKE: Possibly stroke not seen on MRI  Suspected Etiology of symptoms: athero vs cardioembolic source ,  Resultant Symptoms: dysarthria, left facial droop and left-sided neglect. Stroke Risk Factors: atrial fibrillation, hyperlipidemia and hypertension Other Stroke Risk Factors: Advanced age, Coronary artery disease  Outstanding Stroke Work-up Studies:     TEE /Loop Recorder:  to be scheduled as an outpatient  12/08/2017: Neuro exam remains stable.  Left-sided weakness slowly improving.  Patient and her daughter feel right-sided weakness is at baseline.  No further episodes of hypotension reported.  Patient to be transferred to 3 W. today.  CIR consultation.  PLAN  12/08/2017: Transfer to 3 W. Continue Statin HOLD ASA, likely resume in a.m. Frequent neuro checks Telemetry monitoring PT/OT/SLP Consult PM & Rehab Outpatient TEE and Loop Recorder Placement Ongoing aggressive stroke risk factor management Patient counseled to be compliant with her antithrombotic medications Patient counseled on Lifestyle modifications including, Diet, Exercise, and Stress Follow up with [facility] in 6 weeks  INTRACRANIAL Atherosclerosis &Stenosis: Severe stenosis of a right MCA mid M2 branch vessel   DYSPHAGIA: NPO until passes SLP swallow evaluation Aspiration Precautions in  progress  AFIB, Likely CHRONIC: Outpatient TEE and Loop Recorder Placement Will likely need long-term anticoagulation  Patient was on Coumadin a few years ago after developing DVT.  Developed epidural hematoma and Coumadin discontinued.  IVC filter was placed.  MEDICAL ISSUES: Anemia Hemoglobin 11.2 hematocrit 34.0 Repeat labs in a.m.  Thrombocytopenia Platelet count 149 Repeat labs in a.m.  HYPERTENSION: Stable SBP goal of < 180. DBP goal of < 105.  Labetolol PRN Long term BP goal normotensive. May slowly restart home B/P medications after 48 hours Home Meds: Imdur, metoprolol.  Metoprolol restarted 12/08/2017 Imdur held for now  HYPERLIPIDEMIA:    Component Value Date/Time   CHOL 133 12/08/2017 0309   TRIG 98 12/08/2017 0309   HDL 63 12/08/2017 0309   CHOLHDL 2.1 12/08/2017 0309   VLDL 20 12/08/2017 0309   LDLCALC 50 12/08/2017 0309  Home Meds: Crestor 20 mg LDL  goal < 70 Continued on Crestor 20 mg daily Continue statin at discharge  R/O DIABETES: Lab Results  Component Value Date   HGBA1C 5.7 (H) 12/08/2017  HgbA1c goal < 7.0  Other Active Problems: Active Problems:   Stroke (cerebrum) [facility])  Hospital day # 1 VTE prophylaxis: SCD's  Diet : DIET DYS 2 Room service appropriate? Yes; Fluid consistency: Nectar Thick   FAMILY UPDATES: family at bedside  TEAM UPDATES: Garvin Fila, MD     Prior Home Stroke Medications:  aspirin 81 mg daily  Discharge Stroke Meds:  Please discharge patient on TBD   Disposition: 01-Home or Self Care Therapy Recs:               CIR versus home Home Equipment:         None Follow Up:  Follow-up Information    Garvin Fila, MD. Schedule an appointment as soon as possible for a visit in 6 week(s).   Specialties:  Neurology, Radiology Contact information: [address] [place] [address] [phone]          [name], MD -PCP Follow up in 1-2 weeks    [facility]  Neurology Team 12/08/2017 4:39 PM I have personally examined this patient, reviewed notes, independently viewed imaging studies, participated in medical decision making and plan of care.ROS completed by me personally and pertinent positives fully documented  I have made any additions or clarifications directly to the above note. Agree with note above.  She presented with dysarthria and left hemiparesis likely due to embolic right MCA infarct not seen on imaging.  She received TPA and needs close neurological monitoring and strict blood pressure control as per post TPA protocol.  Continue ongoing stroke workup.  Discussed with patient and family and answered questions.  May transfer to the floor later today This patient is critically ill and at significant risk of neurological worsening, death and care requires constant monitoring of vital signs, hemodynamics,respiratory and cardiac monitoring, extensive review of multiple databases, frequent neurological assessment, discussion with family, other specialists and medical decision making of high complexity.I have made any additions or clarifications directly to the above note.This critical care time does not reflect procedure time, or teaching time or supervisory time of PA/NP/Med Resident etc but could involve care discussion time.  I spent 30 minutes of neurocritical care time  in the care of  this patient.     [facility] Pager: [phone] 12/08/2017 5:56 PM To contact Stroke Continuity provider, please refer to [email or web address] After hours, contact General Neurology

## 2017-12-08 NOTE — Consult Note (Signed)
Physical Medicine and Rehabilitation Consult   Reason for Consult: Inability to walk Referring Physician: Leonie Man   HPI: Molly [name] is a 83 y.o. female with history of CAD, h/o PE/DVT (after back surgery), RLE weakness, vertigo,  Afib in the past--no anticoagulation due to resolution, HTN who was admitted on 12/07/17 with left sided weakness, rightward gaze, difficulty speaking and inability to walk. CTA/P done revealing abnormal perfusion right frontal operculum with severe stenosis of right MCA mid M2 brach, no emergent large vessel infarct and aortic atherosclerosis. She was treated with tPA and MRI brain pending.  2 D echo with EF 55-60% with no wall abnormality and moderately calcified AV and calcified mitral annulus. She has had issues with hypotension requiring fluid boluses. Therapy evaluations in progress  and CIR recommended due to functional deficits.   Lives with daughter and independent with rollator--needs supervision with mobility due to right foot drop since lumbar surgery ~70yrs ago (drags her foot). 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 She has hired help to assist with ADLs.    Denies any falls or to admission.  Per daughter does not appear to be any different from a mental status standpoint   Review of Systems  Constitutional: Negative for fever.  HENT: Negative for hearing loss.   Eyes: Positive for blurred vision (wear glasses). Negative for double vision.  Respiratory: Negative for cough and shortness of breath.   Cardiovascular: Negative for chest pain and palpitations.  Gastrointestinal: Positive for constipation and heartburn.  Genitourinary: Positive for frequency. Negative for dysuria.  Musculoskeletal: Positive for back pain. Negative for myalgias.  Skin: Negative for itching and rash.  Neurological: Positive for dizziness (with quick movements), speech change and focal weakness. Negative for headaches.    Psychiatric/Behavioral: The patient does not have insomnia.      Past Medical History:  Diagnosis Date  . Acid reflux   . Anemia   . Arthritis   . CAD (coronary artery disease)   . High cholesterol   . Hyperlipidemia   . Hypertension   . Osteoporosis   . Spinal stenosis     Past Surgical History:  Procedure Laterality Date  . ABDOMINAL HYSTERECTOMY    . BACK SURGERY     Rod and Pins Placed   . CARDIAC CATHETERIZATION N/A 11/17/2015   Procedure: Left Heart Cath and Coronary Angiography;  Surgeon: Jettie Booze, MD;  Location: [facility];  Service: Cardiovascular;  Laterality: N/A;  . CAROTID STENT      Family History  Problem Relation Age of Onset  . Anemia Mother   . Heart attack Brother     Social History:  Lives with daughter. Needed distant supervision prior to admission. She reports that  has never smoked. she has never used smokeless tobacco. She reports that she does not drink alcohol or use drugs.    Allergies  Allergen Reactions  . Buprenorphine Hcl Shortness Of Breath    Chest pain  . Codeine Nausea And Vomiting  . Hydrocodone Nausea And Vomiting  . Morphine And Related Shortness Of Breath    Chest pain  . Sulfa Antibiotics Nausea And Vomiting  . Sulfacetamide Sodium Nausea And Vomiting  . Vicodin [Hydrocodone-Acetaminophen] Nausea And Vomiting  . Evista [Raloxifene] Other (See Comments) and Nausea And Vomiting    Upset stomach  . Hydromorphone Nausea And Vomiting    Sensitive  . Other Other (See Comments)    Narcotic-like medications -  does NOT tolerate pain meds, causes unrelating volatile vomiting and drops in BP below 80/40 with dizziness and fainting  Tourniquet - Please place tourniquet over cloth - causes sever bruising.  Blood pressure cuff - please use manual blood pressure cuff - causes excoriating pain using machine and bruising     . Sulfamethoxazole Nausea And Vomiting  . Sulfamethoxazole-Trimethoprim Nausea And Vomiting   . Sulfur Nausea And Vomiting    Vomited until bleeding occurred and developed an active stomach ulcer  . Tape Other (See Comments)    Rips skin  . Morphine Nausea Only    Made me crazy    Medications Prior to Admission  Medication Sig Dispense Refill  . acetaminophen (TYLENOL) 500 MG tablet Take 500 mg by mouth every 8 (eight) hours as needed for mild pain.     Marland Kitchen aspirin EC 81 MG tablet Take 81 mg by mouth every morning.    . denosumab (PROLIA) 60 MG/ML SOLN injection Inject 60 mg into the skin every 6 (six) months. Administer in upper arm, thigh, or abdomen    . gabapentin (NEURONTIN) 100 MG capsule Take 100 mg by mouth at bedtime as needed (nerve pain).     . Glucosamine-Chondroitin (OSTEO BI-FLEX REGULAR STRENGTH PO) Take 1 capsule by mouth 2 (two) times daily.    . isosorbide mononitrate (IMDUR) 30 MG 24 hr tablet Take 1 tablet (30 mg total) by mouth 2 (two) times daily. 180 tablet 3  . metoprolol succinate (TOPROL-XL) 25 MG 24 hr tablet Take 1 tablet (25 mg total) by mouth daily. (Patient taking differently: Take 25 mg by mouth every evening. ) 90 tablet 3  . Misc Natural Products (NARCOSOFT HERBAL LAX PO) Take 2 capsules by mouth 2 (two) times daily.     . nitroGLYCERIN (NITROSTAT) 0.4 MG SL tablet Place 0.4 mg under the tongue every 5 (five) minutes as needed for chest pain (x 3 pills).     . Omega-3 Fatty Acids (FISH OIL) 1000 MG CAPS Take 1,000 mg by mouth 2 (two) times daily.    Marland Kitchen omeprazole (PRILOSEC) 40 MG capsule Take 40 mg by mouth daily.    . polysaccharide iron (NIFEREX) 150 MG CAPS capsule Take 150 mg by mouth every morning.    . Probiotic Product (ALIGN) 4 MG CAPS Take 1 tablet by mouth every morning.    . rosuvastatin (CRESTOR) 20 MG tablet Take 20 mg by mouth at bedtime.    . sennosides-docusate sodium (SENOKOT-S) 8.6-50 MG tablet Take 2 tablets by mouth 2 (two) times daily.     . TURMERIC PO Take 1 tablet by mouth daily.    . vitamin C (ASCORBIC ACID) 250 MG tablet  Take 250 mg by mouth daily.      Home: Home Living Family/patient expects to be discharged to:: Inpatient rehab Living Arrangements: Children Available Help at Discharge: Family, Available 24 hours/day Type of Home: House Home Access: [place]: One level Bathroom Shower/Tub: Gaffer, Curtain Bathroom Toilet: Handicapped height Home Equipment: Environmental consultant - 4 wheels, Bedside commode, Shower seat, Hand held shower head Additional Comments: Pt has also has hired A that comes in 4 days a week and helps with basic ADLs  Functional History: Prior Function Level of Independence: Needs assistance Gait / Transfers Assistance Needed: Uses a rollator with someone always close by. Pt and dtr both report that pt does normally move slow due to h/o vertigo (but now moving even slower) ADL's / Homemaking Assistance Needed: Does  some of her UB ADLs and has A with [facility] ADLS Functional Status:  Mobility: Bed Mobility Overal bed mobility: Needs Assistance Bed Mobility: Supine to Sit, Sit to Supine Supine to sit: Mod assist, HOB elevated(bed pad to help pivot around and scoot to EOB; A for trunk) Sit to supine: Max assist(HOB flat; A for legs and trunk) General bed mobility comments: Pt moves very slow Transfers Overall transfer level: Needs assistance Equipment used: Rolling walker (2 wheeled) Transfers: Sit to/from Stand Sit to Stand: Mod assist General transfer comment: pt with tendency for RLE so slide forward with sit>stand and needs increased time to come to full upright standing      ADL: ADL Overall ADL's : Needs assistance/impaired Eating/Feeding: Supervision/ safety, Set up, Bed level Grooming: Bed level, Minimal assistance Upper Body Bathing: Minimal assistance, Bed level Lower Body Bathing: Total assistance Lower Body Bathing Details (indicate cue type and reason): Mod A sit<>stand Upper Body Dressing : Maximal assistance Upper Body Dressing Details (indicate  cue type and reason): sitting EOB Lower Body Dressing: Total assistance Lower Body Dressing Details (indicate cue type and reason): Mod A sit<>stand Toilet Transfer Details (indicate cue type and reason): Mod A sit<>stand; min A stand and side step Toileting- Clothing Manipulation and Hygiene: Total assistance Toileting - Clothing Manipulation Details (indicate cue type and reason): Mod A sit<>stand  Cognition: Cognition Overall Cognitive Status: Within Functional Limits for tasks assessed Orientation Level: Oriented X4 Cognition Arousal/Alertness: Awake/alert Behavior During Therapy: WFL for tasks assessed/performed Overall Cognitive Status: Within Functional Limits for tasks assessed   Blood pressure (!) 135/58, pulse 68, temperature 98.2 F (36.8 C), temperature source Oral, resp. rate 16, height 5\' 3"  (1.6 m), weight 75.5 kg (166 lb 7.2 oz), SpO2 100 %. Physical Exam  Nursing note and vitals reviewed. Constitutional: She is oriented to person, place, and time. She appears well-developed and well-nourished. No distress.  HENT:  Head: Normocephalic and atraumatic.  Mouth/Throat: Oropharynx is clear and moist.  Eyes: Conjunctivae are normal. Pupils are equal, round, and reactive to light.  Neck: Normal range of motion. Neck supple.  Cardiovascular: Normal rate and regular rhythm.  Occasional extrasystoles are present.  Respiratory: Effort normal and breath sounds normal. No stridor. No respiratory distress. She has no wheezes.  GI: Soft. Bowel sounds are normal. She exhibits no distension.  Musculoskeletal: She exhibits edema.  1+ edema right forearm. Diffuse ecchymotic areas bilateral forearms. Right foot drop.  Neurological: She is alert and oriented to person, place, and time.  Left facial weakness. Soft voice with slow speech. Able to follow simple one and two step commands without difficulty.    Skin: Skin is warm and dry. She is not diaphoretic.  Psychiatric: She has a  normal mood and affect. She is slowed.  Motor strength 4/5 Bilateral deltoid, bicep, tricep, grip 3- bilateral hip flexion 4 bilateral knee extension 3- right ankle dorsiflexion, 4 at the left ankle dorsiflexor Sensation difficult to assess secondary to decreased concentration  Results for orders placed or performed during the hospital encounter of 12/07/17 (from the past 24 hour(s))  I-stat troponin, ED     Status: None   Collection Time: 12/07/17 10:19 AM  Result Value Ref Range   Troponin i, poc 0.00 0.00 - 0.08 ng/mL   Comment 3          I-Stat Chem 8, ED     Status: Abnormal   Collection Time: 12/07/17 10:21 AM  Result Value Ref Range   Sodium 140  135 - 145 mmol/L   Potassium 4.4 3.5 - 5.1 mmol/L   Chloride 108 101 - 111 mmol/L   BUN 14 6 - 20 mg/dL   Creatinine, Ser 0.90 0.44 - 1.00 mg/dL   Glucose, Bld 96 65 - 99 mg/dL   Calcium, Ion 1.11 (L) 1.15 - 1.40 mmol/L   TCO2 21 (L) 22 - 32 mmol/L   Hemoglobin 12.6 12.0 - 15.0 g/dL   HCT 37.0 36.0 - 46.0 %  Urine rapid drug screen (hosp performed)     Status: None   Collection Time: 12/07/17 11:50 AM  Result Value Ref Range   Opiates NONE DETECTED NONE DETECTED   Cocaine NONE DETECTED NONE DETECTED   Benzodiazepines NONE DETECTED NONE DETECTED   Amphetamines NONE DETECTED NONE DETECTED   Tetrahydrocannabinol NONE DETECTED NONE DETECTED   Barbiturates NONE DETECTED NONE DETECTED  Urinalysis, Routine w reflex microscopic     Status: None   Collection Time: 12/07/17 11:50 AM  Result Value Ref Range   Color, Urine YELLOW YELLOW   APPearance CLEAR CLEAR   Specific Gravity, Urine 1.025 1.005 - 1.030   pH 6.0 5.0 - 8.0   Glucose, UA NEGATIVE NEGATIVE mg/dL   Hgb urine dipstick NEGATIVE NEGATIVE   Bilirubin Urine NEGATIVE NEGATIVE   Ketones, ur NEGATIVE NEGATIVE mg/dL   Protein, ur NEGATIVE NEGATIVE mg/dL   Nitrite NEGATIVE NEGATIVE   Leukocytes, UA NEGATIVE NEGATIVE  MRSA PCR Screening     Status: None   Collection Time:  12/07/17 12:29 PM  Result Value Ref Range   MRSA by PCR NEGATIVE NEGATIVE  Hemoglobin A1c     Status: Abnormal   Collection Time: 12/08/17  3:09 AM  Result Value Ref Range   Hgb A1c MFr Bld 5.7 (H) 4.8 - 5.6 %   Mean Plasma Glucose 116.89 mg/dL  Lipid panel     Status: None   Collection Time: 12/08/17  3:09 AM  Result Value Ref Range   Cholesterol 133 0 - 200 mg/dL   Triglycerides 98 <150 mg/dL   HDL 63 >40 mg/dL   Total CHOL/HDL Ratio 2.1 RATIO   VLDL 20 0 - 40 mg/dL   LDL Cholesterol 50 0 - 99 mg/dL  CBC     Status: Abnormal   Collection Time: 12/08/17  3:09 AM  Result Value Ref Range   WBC 4.8 4.0 - 10.5 K/uL   RBC 3.55 (L) 3.87 - 5.11 MIL/uL   Hemoglobin 11.2 (L) 12.0 - 15.0 g/dL   HCT 34.0 (L) 36.0 - 46.0 %   MCV 95.8 78.0 - 100.0 fL   MCH 31.5 26.0 - 34.0 pg   MCHC 32.9 30.0 - 36.0 g/dL   RDW 13.1 11.5 - 15.5 %   Platelets 149 (L) 150 - 400 K/uL   Ct Angio Head W Or Wo Contrast  Result Date: 12/07/2017 CLINICAL DATA:  Left arm weakness.  Aphasia. EXAM: CT ANGIOGRAPHY HEAD AND NECK CT PERFUSION BRAIN TECHNIQUE: Multidetector CT imaging of the head and neck was performed using the standard protocol during bolus administration of intravenous contrast. Multiplanar CT image reconstructions and MIPs were obtained to evaluate the vascular anatomy. Carotid stenosis measurements (when applicable) are obtained utilizing NASCET criteria, using the distal internal carotid diameter as the denominator. Multiphase CT imaging of the brain was performed following IV bolus contrast injection. Subsequent parametric perfusion maps were calculated using RAPID software. CONTRAST:  81mL ISOVUE-370 IOPAMIDOL (ISOVUE-370) INJECTION 76% COMPARISON:  Noncontrast head CT earlier today. Cervical spine  CT 01/03/2014. No prior angiographic imaging. FINDINGS: CTA NECK FINDINGS Aortic arch: Normal variant 4 vessel aortic arch with the left vertebral artery arising from the arch between the left common carotid  and left subclavian origins. Moderate arch atherosclerosis. No evidence of significant arch vessel origins stenosis. Right carotid system: Patent without evidence of stenosis or dissection. Mild calcified plaque at the carotid bifurcation. Partially retropharyngeal course of the common and internal carotid arteries. Tortuous proximal common carotid artery. Left carotid system: Patent without evidence of stenosis or dissection. Mild calcified plaque at the carotid bifurcation. Tortuous common carotid artery. Vertebral arteries: Patent without evidence of stenosis or dissection. Non stenotic calcified plaque at the left vertebral artery origin. Moderately dominant right vertebral artery. Skeleton: Grade 1 anterolisthesis of C4 on C5 due to advanced facet arthrosis, with facet ankylosis noted bilaterally. Advanced disc degeneration at C5-6 and C6-7. Interbody ankylosis at C6-7. Other neck: 1.6 cm low-density left thyroid nodule, stable to minimally larger than on the 2015 cervical spine CT. Upper chest: Clear lung apices. Review of the MIP images confirms the above findings CTA HEAD FINDINGS Anterior circulation: The internal carotid arteries are patent from skullbase to carotid termini with mild siphon atherosclerosis bilaterally but no significant stenosis. Both ICAs are slightly ectatic in appearance likely reflecting chronic hypertension. MCAs are patent without evidence of M1 stenosis or proximal branch occlusion. There is a severe stenosis of a mid right M2 branch vessel. ACAs are patent without evidence of significant proximal stenosis. No aneurysm. Posterior circulation: The intracranial vertebral arteries are patent to the basilar with the right being dominant and mildly ectatic. There is mild bilateral V4 segment atherosclerosis without significant stenosis. PICA origins are not clearly identified. Patent right AICA and bilateral SCA origins are visualized. The basilar artery is widely patent. Posterior  communicating arteries are not identified and may be diminutive or absent. PCAs are patent without evidence of significant proximal stenosis. No aneurysm. Venous sinuses: Grossly patent within limitations of arterial phase dominant contrast timing. Anatomic variants: None. Delayed phase: Not performed. Review of the MIP images confirms the above findings CT Brain Perfusion Findings: CBF (<30%) Volume:  0 mL Perfusion (Tmax>6.0s) volume:  9 mL Mismatch Volume:  9 mL Infarction Location: No core infarct evident by above CBF threshold. Delayed transit in the right frontal operculum with 5 mL volume of tissue demonstrating CBF <38%. IMPRESSION: 1. No emergent large vessel occlusion. 2. Small region of abnormal perfusion in the right frontal operculum without evidence of core infarct utilizing CBF <30% threshold. 3. Severe stenosis of a right MCA mid M2 branch vessel corresponding to the perfusion abnormality. 4. Mild cervical carotid artery atherosclerosis without stenosis. 5.  Aortic Atherosclerosis (ICD10-I70.0). Preliminary findings of no emergent large vessel occlusion were reviewed in person with Dr. Amie Portland on 12/07/2017 at 10:40 am. Electronically Signed   By: Logan Bores M.D.   On: 12/07/2017 11:03   Ct Angio Neck W Or Wo Contrast  Result Date: 12/07/2017 CLINICAL DATA:  Left arm weakness.  Aphasia. EXAM: CT ANGIOGRAPHY HEAD AND NECK CT PERFUSION BRAIN TECHNIQUE: Multidetector CT imaging of the head and neck was performed using the standard protocol during bolus administration of intravenous contrast. Multiplanar CT image reconstructions and MIPs were obtained to evaluate the vascular anatomy. Carotid stenosis measurements (when applicable) are obtained utilizing NASCET criteria, using the distal internal carotid diameter as the denominator. Multiphase CT imaging of the brain was performed following IV bolus contrast injection. Subsequent parametric perfusion maps were calculated  using RAPID software.  CONTRAST:  23mL ISOVUE-370 IOPAMIDOL (ISOVUE-370) INJECTION 76% COMPARISON:  Noncontrast head CT earlier today. Cervical spine CT 01/03/2014. No prior angiographic imaging. FINDINGS: CTA NECK FINDINGS Aortic arch: Normal variant 4 vessel aortic arch with the left vertebral artery arising from the arch between the left common carotid and left subclavian origins. Moderate arch atherosclerosis. No evidence of significant arch vessel origins stenosis. Right carotid system: Patent without evidence of stenosis or dissection. Mild calcified plaque at the carotid bifurcation. Partially retropharyngeal course of the common and internal carotid arteries. Tortuous proximal common carotid artery. Left carotid system: Patent without evidence of stenosis or dissection. Mild calcified plaque at the carotid bifurcation. Tortuous common carotid artery. Vertebral arteries: Patent without evidence of stenosis or dissection. Non stenotic calcified plaque at the left vertebral artery origin. Moderately dominant right vertebral artery. Skeleton: Grade 1 anterolisthesis of C4 on C5 due to advanced facet arthrosis, with facet ankylosis noted bilaterally. Advanced disc degeneration at C5-6 and C6-7. Interbody ankylosis at C6-7. Other neck: 1.6 cm low-density left thyroid nodule, stable to minimally larger than on the 2015 cervical spine CT. Upper chest: Clear lung apices. Review of the MIP images confirms the above findings CTA HEAD FINDINGS Anterior circulation: The internal carotid arteries are patent from skullbase to carotid termini with mild siphon atherosclerosis bilaterally but no significant stenosis. Both ICAs are slightly ectatic in appearance likely reflecting chronic hypertension. MCAs are patent without evidence of M1 stenosis or proximal branch occlusion. There is a severe stenosis of a mid right M2 branch vessel. ACAs are patent without evidence of significant proximal stenosis. No aneurysm. Posterior circulation: The  intracranial vertebral arteries are patent to the basilar with the right being dominant and mildly ectatic. There is mild bilateral V4 segment atherosclerosis without significant stenosis. PICA origins are not clearly identified. Patent right AICA and bilateral SCA origins are visualized. The basilar artery is widely patent. Posterior communicating arteries are not identified and may be diminutive or absent. PCAs are patent without evidence of significant proximal stenosis. No aneurysm. Venous sinuses: Grossly patent within limitations of arterial phase dominant contrast timing. Anatomic variants: None. Delayed phase: Not performed. Review of the MIP images confirms the above findings CT Brain Perfusion Findings: CBF (<30%) Volume:  0 mL Perfusion (Tmax>6.0s) volume:  9 mL Mismatch Volume:  9 mL Infarction Location: No core infarct evident by above CBF threshold. Delayed transit in the right frontal operculum with 5 mL volume of tissue demonstrating CBF <38%. IMPRESSION: 1. No emergent large vessel occlusion. 2. Small region of abnormal perfusion in the right frontal operculum without evidence of core infarct utilizing CBF <30% threshold. 3. Severe stenosis of a right MCA mid M2 branch vessel corresponding to the perfusion abnormality. 4. Mild cervical carotid artery atherosclerosis without stenosis. 5.  Aortic Atherosclerosis (ICD10-I70.0). Preliminary findings of no emergent large vessel occlusion were reviewed in person with Dr. Amie Portland on 12/07/2017 at 10:40 am. Electronically Signed   By: Logan Bores M.D.   On: 12/07/2017 11:03   Ct Cerebral Perfusion W Contrast  Result Date: 12/07/2017 CLINICAL DATA:  Left arm weakness.  Aphasia. EXAM: CT ANGIOGRAPHY HEAD AND NECK CT PERFUSION BRAIN TECHNIQUE: Multidetector CT imaging of the head and neck was performed using the standard protocol during bolus administration of intravenous contrast. Multiplanar CT image reconstructions and MIPs were obtained to  evaluate the vascular anatomy. Carotid stenosis measurements (when applicable) are obtained utilizing NASCET criteria, using the distal internal carotid diameter as the denominator.  Multiphase CT imaging of the brain was performed following IV bolus contrast injection. Subsequent parametric perfusion maps were calculated using RAPID software. CONTRAST:  47mL ISOVUE-370 IOPAMIDOL (ISOVUE-370) INJECTION 76% COMPARISON:  Noncontrast head CT earlier today. Cervical spine CT 01/03/2014. No prior angiographic imaging. FINDINGS: CTA NECK FINDINGS Aortic arch: Normal variant 4 vessel aortic arch with the left vertebral artery arising from the arch between the left common carotid and left subclavian origins. Moderate arch atherosclerosis. No evidence of significant arch vessel origins stenosis. Right carotid system: Patent without evidence of stenosis or dissection. Mild calcified plaque at the carotid bifurcation. Partially retropharyngeal course of the common and internal carotid arteries. Tortuous proximal common carotid artery. Left carotid system: Patent without evidence of stenosis or dissection. Mild calcified plaque at the carotid bifurcation. Tortuous common carotid artery. Vertebral arteries: Patent without evidence of stenosis or dissection. Non stenotic calcified plaque at the left vertebral artery origin. Moderately dominant right vertebral artery. Skeleton: Grade 1 anterolisthesis of C4 on C5 due to advanced facet arthrosis, with facet ankylosis noted bilaterally. Advanced disc degeneration at C5-6 and C6-7. Interbody ankylosis at C6-7. Other neck: 1.6 cm low-density left thyroid nodule, stable to minimally larger than on the 2015 cervical spine CT. Upper chest: Clear lung apices. Review of the MIP images confirms the above findings CTA HEAD FINDINGS Anterior circulation: The internal carotid arteries are patent from skullbase to carotid termini with mild siphon atherosclerosis bilaterally but no significant  stenosis. Both ICAs are slightly ectatic in appearance likely reflecting chronic hypertension. MCAs are patent without evidence of M1 stenosis or proximal branch occlusion. There is a severe stenosis of a mid right M2 branch vessel. ACAs are patent without evidence of significant proximal stenosis. No aneurysm. Posterior circulation: The intracranial vertebral arteries are patent to the basilar with the right being dominant and mildly ectatic. There is mild bilateral V4 segment atherosclerosis without significant stenosis. PICA origins are not clearly identified. Patent right AICA and bilateral SCA origins are visualized. The basilar artery is widely patent. Posterior communicating arteries are not identified and may be diminutive or absent. PCAs are patent without evidence of significant proximal stenosis. No aneurysm. Venous sinuses: Grossly patent within limitations of arterial phase dominant contrast timing. Anatomic variants: None. Delayed phase: Not performed. Review of the MIP images confirms the above findings CT Brain Perfusion Findings: CBF (<30%) Volume:  0 mL Perfusion (Tmax>6.0s) volume:  9 mL Mismatch Volume:  9 mL Infarction Location: No core infarct evident by above CBF threshold. Delayed transit in the right frontal operculum with 5 mL volume of tissue demonstrating CBF <38%. IMPRESSION: 1. No emergent large vessel occlusion. 2. Small region of abnormal perfusion in the right frontal operculum without evidence of core infarct utilizing CBF <30% threshold. 3. Severe stenosis of a right MCA mid M2 branch vessel corresponding to the perfusion abnormality. 4. Mild cervical carotid artery atherosclerosis without stenosis. 5.  Aortic Atherosclerosis (ICD10-I70.0). Preliminary findings of no emergent large vessel occlusion were reviewed in person with Dr. Amie Portland on 12/07/2017 at 10:40 am. Electronically Signed   By: Logan Bores M.D.   On: 12/07/2017 11:03   Dg Chest Port 1 View  Result Date:  12/07/2017 CLINICAL DATA:  Follow-up stroke.  Weakness and confusion. EXAM: PORTABLE CHEST 1 VIEW COMPARISON:  03/03/2012. FINDINGS: Mediastinum and hilar structures normal. Lungs are clear. No pleural effusion or pneumothorax. Heart size normal. No acute bony abnormality identified. IMPRESSION: No acute cardiopulmonary disease. Electronically Signed   By: Marcello Moores  Register  On: 12/07/2017 13:25   Ct Head Code Stroke Wo Contrast  Result Date: 12/07/2017 CLINICAL DATA:  Code stroke. 83 year old female last known well at 0915 hours. Left arm weakness and drift. Aphasia. EXAM: CT HEAD WITHOUT CONTRAST TECHNIQUE: Contiguous axial images were obtained from the base of the skull through the vertex without intravenous contrast. COMPARISON:  Head and cervical spine CT 01/03/2014. FINDINGS: Brain: Cavum septum pellucidum normal variant. Cerebral volume is not significantly changed since 2015. No ventriculomegaly. No acute intracranial hemorrhage identified. No midline shift, mass effect, or evidence of intracranial mass lesion. Gray-white matter differentiation is stable and normal throughout the brain. No cortically based acute infarct identified. No cortical encephalomalacia. Vascular: Calcified atherosclerosis at the skull base. No suspicious intracranial vascular hyperdensity. Skull: No acute osseous abnormality identified. Sinuses/Orbits: Clear. Other: No acute orbit or scalp soft tissue findings. Postoperative changes to both globes. ASPECTS [facility] Stroke Program Early CT Score) - Ganglionic level infarction (caudate, lentiform nuclei, internal capsule, insula, M1-M3 cortex): 7 - Supraganglionic infarction (M4-M6 cortex): 3 Total score (0-10 with 10 being normal): 10 IMPRESSION: 1. Noncontrast CT appearance of the brain is stable since 2015 and normal for age. 2. ASPECTS is 10. 3. These results were communicated to Dr. Rory Percy at 1023 hours on 12/07/2017 by text page via the [facility] messaging system. Electronically  Signed   By: Genevie Ann M.D.   On: 12/07/2017 10:23    Assessment/Plan: Diagnosis: Left hemiparesis resolved, has chronic gait disorder secondary to right foot drop, chronic radiculopathy 1. Does the need for close, 24 hr/day medical supervision in concert with the patient's rehab needs make it unreasonable for this patient to be served in a less intensive setting? Potentially 2. Co-Morbidities requiring supervision/potential complications: History of atrial fibrillation, history of PE and DVT, history of CAD 3. Due to bladder management, bowel management, safety, skin/wound care and patient education, does the patient require 24 hr/day rehab nursing? Potentially 4. Does the patient require coordinated care of a physician, rehab nurse, PT, OT, SLP to address physical and functional deficits in the context of the above medical diagnosis(es)? Potentially Addressing deficits in the following areas: balance, endurance, locomotion, strength, transferring and Required help with ADLs before so she is at baseline 5. Can the patient actively participate in an intensive therapy program of at least 3 hrs of therapy per day at least 5 days per week? No 6. The potential for patient to make measurable gains while on inpatient rehab is Limited secondary to prior mobility and ADL deficits 7. Anticipated functional outcomes upon discharge from inpatient rehab are n/a  with PT, n/a with OT, n/a with SLP. 8. Estimated rehab length of stay to reach the above functional goals is: Not applicable 9. Anticipated D/C setting: Home 10. Anticipated post D/C treatments: [place] therapy 11. Overall Rehab/Functional Prognosis: fair  RECOMMENDATIONS: This patient's condition is appropriate for continued rehabilitative care in the following setting: [facility] Therapy Patient has agreed to participate in recommended program. N/A Note that insurance prior authorization may be required for reimbursement for recommended care.  Comment: Await  neurology re-eval.  At this point patient looks like she is back near her baseline.  Charlett Blake M.D. [facility] FAAPM&R (Sports Med, Neuromuscular Med) Diplomate Am Board of Electrodiagnostic Med  Flora Lipps 12/08/2017

## 2017-12-08 NOTE — Evaluation (Signed)
Occupational Therapy Evaluation Patient Details Name: Molly [name] MRN: [id number] DOB: 1929-03-24 Today's Date: 12/08/2017    History of Present Illness Molly [name] is a 83 y.o. female PHMx: of CAD, hyperlipidemia, HTN, A-fib, vertigo, back sx a couple of years ago with continued RLE weakness and foot drop presenting to the emergency room with left-sided weakness and inability to stand. TPA was given. CT negative, awaiting MRI   Clinical Impression   This 83 yo female admitted with above presents to acute OT moving at a slow pace (slower than normal per pt and dtr), slowness of movement of LUE, decreased strength RLE from prior back sx, decreased standing balance all affecting her ability to help with her basic ADLs as she was pta. She will benefit from acute OT with follow up OT on CIR to get back to PLOF.    Follow Up Recommendations  CIR;Supervision/Assistance - 24 hour    Equipment Recommendations  Other (comment)(TBD at next venue)       Precautions / Restrictions Precautions Precautions: Fall Restrictions Weight Bearing Restrictions: No      Mobility Bed Mobility Overal bed mobility: Needs Assistance Bed Mobility: Supine to Sit;Sit to Supine     Supine to sit: Mod assist;HOB elevated(bed pad to help pivot around and scoot to EOB; A for trunk) Sit to supine: Max assist(HOB flat; A for legs and trunk)   General bed mobility comments: Pt moves very slow  Transfers Overall transfer level: Needs assistance Equipment used: Rolling walker (2 wheeled) Transfers: Sit to/from Stand Sit to Stand: Mod assist         General transfer comment: pt with tendency for RLE so slide forward with sit>stand and needs increased time to come to full upright standing    Balance   Sitting-balance support: No upper extremity supported;Feet supported Sitting balance-Leahy Scale: Fair     Standing balance support: Bilateral upper extremity supported Standing  balance-Leahy Scale: Poor Standing balance comment: posterior lean with increased time to get weight over feet                           ADL either performed or assessed with clinical judgement   ADL Overall ADL's : Needs assistance/impaired Eating/Feeding: Supervision/ safety;Set up;Bed level   Grooming: Bed level;Minimal assistance   Upper Body Bathing: Minimal assistance;Bed level   Lower Body Bathing: Total assistance Lower Body Bathing Details (indicate cue type and reason): Mod A sit<>stand Upper Body Dressing : Maximal assistance Upper Body Dressing Details (indicate cue type and reason): sitting EOB Lower Body Dressing: Total assistance Lower Body Dressing Details (indicate cue type and reason): Mod A sit<>stand   Toilet Transfer Details (indicate cue type and reason): Mod A sit<>stand; min A stand and side step Toileting- Clothing Manipulation and Hygiene: Total assistance Toileting - Clothing Manipulation Details (indicate cue type and reason): Mod A sit<>stand             Vision Baseline Vision/History: Wears glasses Wears Glasses: At all times Patient Visual Report: No change from baseline Vision Assessment?: Yes Eye Alignment: Within Functional Limits Ocular Range of Motion: Within Functional Limits Alignment/Gaze Preference: Within Defined Limits Tracking/Visual Pursuits: Able to track stimulus in all quads without difficulty Convergence: Within functional limits Visual Fields: No apparent deficits            Pertinent Vitals/Pain Pain Assessment: No/denies pain     Hand Dominance Right   Extremity/Trunk Assessment Upper Extremity Assessment Upper Extremity  Assessment: RUE deficits/detail;LUE deficits/detail RUE Deficits / Details: generalized weakness LUE Deficits / Details: Can move arm for movements asked but quite slow compared to RUE           Communication Communication Communication: (slow and low volume)   Cognition  Arousal/Alertness: Awake/alert Behavior During Therapy: WFL for tasks assessed/performed Overall Cognitive Status: Within Functional Limits for tasks assessed                                                Home Living Family/patient expects to be discharged to:: Inpatient rehab Living Arrangements: Children Available Help at Discharge: Family;Available 24 hours/day Type of Home: House Home Access: Ramped entrance     Home Layout: One level     Bathroom Shower/Tub: Walk-in shower;Curtain   Bathroom Toilet: Handicapped height     Home Equipment: Environmental consultant - 4 wheels;Bedside commode;Shower seat;Hand held shower head   Additional Comments: Pt has also has hired A that comes in 4 days a week and helps with basic ADLs      Prior Functioning/Environment Level of Independence: Needs assistance  Gait / Transfers Assistance Needed: Uses a rollator with someone always close by. Pt and dtr both report that pt does normally move slow due to h/o vertigo (but now moving even slower) ADL's / Homemaking Assistance Needed: Does some of her UB ADLs and has A with [facility] ADLS            OT Problem List: Decreased strength;Decreased range of motion;Impaired balance (sitting and/or standing);Decreased safety awareness;Decreased coordination;Decreased knowledge of use of DME or AE;Impaired UE functional use      OT Treatment/Interventions: Self-care/ADL training;Balance training;Therapeutic activities;DME and/or AE instruction;Patient/family education    OT Goals(Current goals can be found in the care plan section) Acute Rehab OT Goals Patient Stated Goal: family would like CIR OT Goal Formulation: With patient/family Time For Goal Achievement: 12/22/17 Potential to Achieve Goals: Good  OT Frequency: Min 3X/week              AM-PAC PT "6 Clicks" Daily Activity     Outcome Measure Help from another person eating meals?: A Little Help from another person taking care of  personal grooming?: A Little Help from another person toileting, which includes using toliet, bedpan, or urinal?: A Lot Help from another person bathing (including washing, rinsing, drying)?: A Lot Help from another person to put on and taking off regular upper body clothing?: A Lot Help from another person to put on and taking off regular lower body clothing?: Total 6 Click Score: 13   End of Session Equipment Utilized During Treatment: Gait belt;Rolling walker Nurse Communication: Mobility status(with NT)  Activity Tolerance: Patient tolerated treatment well Patient left: in bed;with call bell/phone within reach(NT setting her up to eat)  OT Visit Diagnosis: Unsteadiness on feet (R26.81);Other abnormalities of gait and mobility (R26.89);Muscle weakness (generalized) (M62.81);Hemiplegia and hemiparesis Hemiplegia - Right/Left: Left Hemiplegia - dominant/non-dominant: Non-Dominant Hemiplegia - caused by: (CT negative at this time, awaiting MRI)                Time: 8003-4917 OT Time Calculation (min): 38 min Charges:  OT General Charges $OT Visit: 1 Visit OT Evaluation $OT Eval Moderate Complexity: 1 Mod OT Treatments $Self Care/Home Management : 23-37 mins [place] Circle, OTR/L [phone] 12/08/2017

## 2017-12-09 LAB — BASIC METABOLIC PANEL
Anion gap: 7 (ref 5–15)
BUN: 9 mg/dL (ref 6–20)
CHLORIDE: 109 mmol/L (ref 101–111)
CO2: 23 mmol/L (ref 22–32)
Calcium: 8.9 mg/dL (ref 8.9–10.3)
Creatinine, Ser: 0.66 mg/dL (ref 0.44–1.00)
GFR calc non Af Amer: >60 mL/min (ref >60)
Glucose, Bld: 91 mg/dL (ref 65–99)
POTASSIUM: 3.6 mmol/L (ref 3.5–5.1)
SODIUM: 139 mmol/L (ref 135–145)

## 2017-12-09 LAB — CBC
HEMATOCRIT: 33.9 % — AB (ref 36.0–46.0)
HEMOGLOBIN: 11.3 g/dL — AB (ref 12.0–15.0)
MCH: 32.1 pg (ref 26.0–34.0)
MCHC: 33.3 g/dL (ref 30.0–36.0)
MCV: 96.3 fL (ref 78.0–100.0)
Platelets: 159 10*3/uL (ref 150–400)
RBC: 3.52 MIL/uL — AB (ref 3.87–5.11)
RDW: 13.4 % (ref 11.5–15.5)
WBC: 5.6 10*3/uL (ref 4.0–10.5)

## 2017-12-09 LAB — PHOSPHORUS: PHOSPHORUS: 2.9 mg/dL (ref 2.5–4.6)

## 2017-12-09 LAB — MAGNESIUM: MAGNESIUM: 2 mg/dL (ref 1.7–2.4)

## 2017-12-09 MED ORDER — CLOPIDOGREL BISULFATE 75 MG PO TABS
75.0000 mg | ORAL_TABLET | Freq: Every day | ORAL | Status: DC
  Administered 2017-12-09 – 2017-12-12 (×4): 75 mg via ORAL
  Filled 2017-12-09 (×4): qty 1

## 2017-12-09 MED ORDER — ISOSORBIDE MONONITRATE ER 30 MG PO TB24
30.0000 mg | ORAL_TABLET | Freq: Two times a day (BID) | ORAL | Status: DC
Start: 2017-12-10 — End: 2017-12-12
  Administered 2017-12-10 – 2017-12-12 (×4): 30 mg via ORAL
  Filled 2017-12-09 (×5): qty 1

## 2017-12-09 NOTE — Progress Notes (Signed)
  Speech Language Pathology Treatment: Dysphagia  Patient Details Name: Molly [name] MRN: [id number] DOB: 07-16-1929 Today's Date: 12/09/2017 Time: 2992-4268 SLP Time Calculation (min) (ACUTE ONLY): 33 min  Assessment / Plan / Recommendation Clinical Impression  Pt seen for skilled treatment/differential diagnosis with various consistencies with oropharyngeal issues paired with overall weakness noted, anterior right loss of saliva/cup sips of thin liquids (likely d/t decreased sensory awareness); impaired mastication with Dysphagia 2 consistency and increased oral transit time; no overt s/s of aspiration with any consistency, but MBS may be beneficial to r/o silent aspiration with thin liquids prior to progressing liquids; Dysphagia 2/nectar-thickened liquid diet continues to be most appropriate diet given impaired mastication and difficulty with bolus manipulation paired with weakness/sensory impairment.  Speech remains dysarthric and pt has a low vocal intensity when speaking which makes her intelligibility decreased with >phrases during communicative interactions. ST will continue to f/u while in acute setting; MBS pending.  HPI HPI:  83 y.o. female past medical history of coronary artery disease, hyperlipidemia, hypertension, atrial fibrillation at some point in the past on no anticoagulation because of resolution of A. fib at that time, presenting to the emergency room with a last known normal of 9:15 AM on 12/07/2017 with left-sided weakness and inability to stand when she wanted to get up from the breakfast table.`      SLP Plan  Continue with current plan of care;MBS;Other (Comment)(MBS prn)       Recommendations  Diet recommendations: Dysphagia 2 (fine chop);Nectar-thick liquid Liquids provided via: Cup;Straw Medication Administration: Whole meds with puree Supervision: Patient able to self feed;Staff to assist with self feeding;Full supervision/cueing for compensatory  strategies Compensations: Slow rate;Small sips/bites Postural Changes and/or Swallow Maneuvers: Seated upright 90 degrees                Oral Care Recommendations: Oral care BID Follow up Recommendations: 24 hour supervision/assistance SLP Visit Diagnosis: Dysphagia, oropharyngeal phase (R13.12) Plan: Continue with current plan of care;MBS;Other (Comment)(MBS prn)                       Elvina Sidle, M.S., CCC-SLP 12/09/2017, 1:27 PM

## 2017-12-09 NOTE — Evaluation (Signed)
Speech Language Pathology Evaluation Patient Details Name: Molly [name] MRN: [id number] DOB: 18-Aug-1929 Today's Date: 12/09/2017 Time: 0093-8182 SLP Time Calculation (min) (ACUTE ONLY): 33 min  Problem List:  Patient Active Problem List   Diagnosis Date Noted  . Stroke (cerebrum) ([place]) 12/07/2017  . Accelerating angina ([place])   . Bilateral lower extremity edema 08/15/2015  . Skin thinning 07/23/2015  . S/P lumbar spine operation 07/14/2015  . Deep vein thrombosis ([place]) 06/30/2015  . Reduced mobility 06/19/2015  . Degenerative spondylolisthesis 06/17/2015  . SPL (spondylolisthesis) 06/12/2015  . CN (constipation) 06/04/2015  . Arteriosclerosis of coronary artery 06/04/2015  . Diastolic dysfunction with heart failure ([place]) 06/04/2015  . History of digestive disease 06/04/2015  . HLD (hyperlipidemia) 06/04/2015  . BP (high blood pressure) 06/04/2015  . PONV (postoperative nausea and vomiting) 06/04/2015  . OP (osteoporosis) 06/04/2015  . Lumbar canal stenosis 04/07/2015  . CAD (coronary artery disease) 03/03/2012  . HTN (hypertension) 03/03/2012  . Hyperlipidemia 03/03/2012  . GERD (gastroesophageal reflux disease) 03/03/2012   Past Medical History:  Past Medical History:  Diagnosis Date  . Acid reflux   . Anemia   . Arthritis   . CAD (coronary artery disease)   . High cholesterol   . Hyperlipidemia   . Hypertension   . Osteoporosis   . Spinal stenosis    Past Surgical History:  Past Surgical History:  Procedure Laterality Date  . ABDOMINAL HYSTERECTOMY    . BACK SURGERY     Rod and Pins Placed   . CARDIAC CATHETERIZATION N/A 11/17/2015   Procedure: Left Heart Cath and Coronary Angiography;  Surgeon: Jettie Booze, MD;  Location: [facility];  Service: Cardiovascular;  Laterality: N/A;  . CAROTID STENT     HPI:   83 y.o. female past medical history of coronary artery disease, hyperlipidemia, hypertension, atrial fibrillation at some point in the  past on no anticoagulation because of resolution of A. fib at that time, presenting to the emergency room with a last known normal of 9:15 AM on 12/07/2017 with left-sided weakness and inability to stand when she wanted to get up from the breakfast table.` Pt administered TPN and symptoms have resolved somewhat; MRI 12/08/17 negative for acute processes.  Assessment / Plan / Recommendation Clinical Impression   Pt administered portion of MOCA [facility] Cognitive Assessment) with graphic expression omitted d/t arm pain; pt exhibiting an overall delay in processing/responding to information during communicative interactions; she is able to follow directives, but needs increased processing time and time to carry out activity; speech intelligibility is reduced during phrases-conversation with 25-75% accuracy noted during communicative attempts; compensatory strategies assisted with increased intelligibility including increased vocal intensity, slow rate and over-articulation.  Family stated she has baseline deficits with processing and word finding intermittently, so this may be an exacerbation of her baseline functioning; will continue to assess while in acute setting; pt would benefit from dysarthria tx and ongoing cognitive assessment until reaches baseline.    SLP Assessment  SLP Visit Diagnosis: Dysphagia, oropharyngeal phase (R13.12);Dysarthria and anarthria (R47.1);Cognitive communication deficit (R41.841)    Follow Up Recommendations  24 hour supervision/assistance    Frequency and Duration min 2x/week  1 week      SLP Evaluation Cognition  Overall Cognitive Status: Difficult to assess Arousal/Alertness: Awake/alert Orientation Level: Oriented X4 Comments: Overall decreased processing; unsure if this is baseline vs extension of previous mental status       Comprehension  Auditory Comprehension Overall Auditory Comprehension: Impaired Yes/No Questions:  Within Functional  Limits Commands: Within Functional Limits Conversation: Simple Other Conversation Comments: decreased processing overall Interfering Components: Processing speed EffectiveTechniques: Extra processing time Visual Recognition/Discrimination Discrimination: Not tested Reading Comprehension Reading Status: Not tested    Expression Expression Primary Mode of Expression: Verbal Verbal Expression Overall Verbal Expression: Impaired at baseline Initiation: No impairment Level of Generative/Spontaneous Verbalization: Conversation Repetition: Impaired Level of Impairment: Sentence level Naming: Impairment Responsive: 51-75% accurate Confrontation: Within functional limits Convergent: 50-74% accurate Divergent: 50-74% accurate Other Naming Comments: word finding in conversation Verbal Errors: Perseveration Pragmatics: No impairment Interfering Components: Premorbid deficit Non-Verbal Means of Communication: Not applicable Written Expression Dominant Hand: Right Written Expression: Not tested(d/t pain)   Oral / Motor  Oral Motor/Sensory Function Overall Oral Motor/Sensory Function: Generalized oral weakness Motor Speech Overall Motor Speech: Appears within functional limits for tasks assessed Respiration: Impaired Level of Impairment: Sentence Phonation: Low vocal intensity Resonance: Within functional limits Articulation: Impaired Level of Impairment: Phrase Intelligibility: Intelligibility reduced Word: 50-74% accurate Phrase: 50-74% accurate Sentence: 25-49% accurate Conversation: 25-49% accurate Motor Planning: Witnin functional limits Effective Techniques: Slow rate;Increased vocal intensity;Over-articulate                       Elvina Sidle, M.S., CCC-SLP 12/09/2017, 1:42 PM

## 2017-12-09 NOTE — Progress Notes (Signed)
NEUROHOSPITALISTS STROKE TEAM - DAILY PROGRESS NOTE   ADMISSION HISTORY: Molly [name] is a 83 y.o. female past medical history of coronary artery disease, hyperlipidemia, hypertension, atrial fibrillation at some point in the past on no anticoagulation because of resolution of A. fib at that time, presenting to the emergency room with a last known normal of 9:15 AM on 12/07/2017 with left-sided weakness and inability to stand when she wanted to get up from the breakfast table. Confirmed last seen normal by the daughter that she lives with at home at 9:15 AM. Left-sided weakness and inability to walk.  911 was called.  On their examination, she had mild left-sided drift, rightward gaze and some a aphasia or dysarthria. Patient has had no recent surgeries.  She had some problems with a spinal surgery postoperative bleeding and was unable to be anticoagulated and an IVC filter had to be placed for a DVT PE at that time. No current bleeding diathesis reported. Not on a blood thinner at this time other than a antiplatelet baby aspirin.  LKW: 0915 hrs. on 12/07/17 tpa given?:  Yes Premorbid modified Rankin scale (mRS): 2 NIHSS 1a Level of Conscious.: 0 1b LOC Questions: 0 1c LOC Commands: 0 2 Best Gaze: 1 3 Visual: 2 4 Facial Palsy: 1 5a Motor Arm - left: 1 5b Motor Arm - Right: 0 6a Motor Leg - Left: 0 6b Motor Leg - Right: 0 7 Limb Ataxia: 0 8 Sensory: 2 9 Best Language: 0 10 Dysarthria: 1 11 Extinct. and Inatten.: 2 TOTAL: 10  SUBJECTIVE (INTERVAL HISTORY) Daughter is at the bedside. Patient is found laying in bed in NAD. Overall she feels her condition is gradually improving but thinks she is weaker today. She voices concerns over not having walked since admission. No further episodes of hypotension reported overnight.   OBJECTIVE Lab Results: CBC:  Recent Labs  Lab 12/07/17 1012 12/07/17 1021 12/08/17 0309  12/09/17 0425  WBC 6.1  --  4.8 5.6  HGB 13.1 12.6 11.2* 11.3*  HCT 38.7 37.0 34.0* 33.9*  MCV 96.5  --  95.8 96.3  PLT 172  --  149* 159   BMP: Recent Labs  Lab 12/07/17 1012 12/07/17 1021 12/09/17 0425  NA 140 140 139  K 4.5 4.4 3.6  CL 107 108 109  CO2 24  --  23  GLUCOSE 101* 96 91  BUN 13 14 9   CREATININE 0.91 0.90 0.66  CALCIUM 9.5  --  8.9  MG  --   --  2.0  PHOS  --   --  2.9   Liver Function Tests:  Recent Labs  Lab 12/07/17 1012  AST 36  ALT 24  ALKPHOS 34*  BILITOT 0.7  PROT 7.0  ALBUMIN 3.9   Coagulation Studies:  Recent Labs    12/07/17 1012  APTT 31  INR 1.01    Urinalysis:  Recent Labs  Lab 12/07/17 1150  COLORURINE YELLOW  APPEARANCEUR CLEAR  LABSPEC 1.025  PHURINE 6.0  GLUCOSEU NEGATIVE  HGBUR NEGATIVE  BILIRUBINUR NEGATIVE  KETONESUR NEGATIVE  PROTEINUR NEGATIVE  NITRITE NEGATIVE  LEUKOCYTESUR NEGATIVE   Urine Drug Screen:     Component Value Date/Time   LABOPIA NONE DETECTED 12/07/2017 1150   COCAINSCRNUR NONE DETECTED 12/07/2017 1150   LABBENZ NONE DETECTED 12/07/2017 1150   AMPHETMU NONE DETECTED 12/07/2017 1150   THCU NONE DETECTED 12/07/2017 1150   LABBARB NONE DETECTED 12/07/2017 1150    Alcohol Level:  Recent Labs  Lab  12/07/17 1012  ETH <10   PHYSICAL EXAM Temp:  [97.6 F (36.4 C)-99 F (37.2 C)] 99 F (37.2 C) (12/28 1409) Pulse Rate:  [58-70] 58 (12/28 1409) Resp:  [15-20] 20 (12/28 1409) BP: (112-151)/(48-85) 137/50 (12/28 1409) SpO2:  [98 %-100 %] 100 % (12/28 1409) Weight:  [76 kg (167 lb 8.8 oz)] 76 kg (167 lb 8.8 oz) (12/27 2102) General - Well nourished, well developed, in no apparent distress Respiratory - Lungs clear bilaterally. No wheezing. Cardiovascular - Regular rate and rhythm  Neurological exam Patient appears worried, answers some questions. Follows all commands. Her speech is dysarthric.  She is slow to respond to all questions. Cranial nerves: Pupils equal round reactive to light,  she has a right gaze preference and unable to cross midline, left homonymous hemianopsia, left facial droop, decreased sensation on the left side of the face, palate elevates symmetrically, auditory acuity grossly intact to voice, shoulder shrug intact, tongue midline. Motor exam: She is able to lift all 4 extremities antigravity with a minimal vertical drift in the left upper extremity. Sensory exam: Profound sensory loss on the left side.  Sensory neglect on the left.  Inconsistent extinction exam Coordination: Slow to perform but intact finger nose finger on the right, mildly ataxic on the left. Gait was not tested for patient safety.  IMAGING: I have personally reviewed the radiological images below and agree with the radiology interpretations.  Ct Angio Head, Perfusion and Neck W Or Wo Contrast Result Date: 12/07/2017 IMPRESSION: 1. No emergent large vessel occlusion. 2. Small region of abnormal perfusion in the right frontal operculum without evidence of core infarct utilizing CBF <30% threshold. 3. Severe stenosis of a right MCA mid M2 branch vessel corresponding to the perfusion abnormality. 4. Mild cervical carotid artery atherosclerosis without stenosis. 5.  Aortic Atherosclerosis (ICD10-I70.0).   Mr Brain Wo Contrast Result Date: 12/08/2017 IMPRESSION: Negative noncontrast MRI of the head for age.   Dg Chest Port 1 View Result Date: 12/07/2017 IMPRESSION: No acute cardiopulmonary disease.   Ct Head Code Stroke Wo Contrast Result Date: 12/07/2017 IMPRESSION: 1. Noncontrast CT appearance of the brain is stable since 2015 and normal for age. 2. ASPECTS is 10.    Echocardiogram:  Study Conclusions - Left ventricle: The cavity size was normal. Wall thickness was   normal. Systolic function was normal. The estimated ejection   fraction was in the range of 55% to 60%. Wall motion was normal;   there were no regional wall motion abnormalities. Doppler   parameters are consistent  with abnormal left ventricular   relaxation (grade 1 diastolic dysfunction). - Aortic valve: There was mild regurgitation. - Mitral valve: Calcified annulus. Impressions: - Normal LV systolic function; mild diastolic dysfunction; mild AI;   trace MR and TR.     ASSESSMENT: [name] is a 83 y.o. female with PMH of coronary artery disease, hyperlipidemia, hypertension, atrial fibrillation on no anticoagulation presenting to the emergency room with a last known normal of 9:15 AM on 12/07/2017 with left-sided weakness and inability to stand. NIHSS 10. CT-scan of the brain -no acute change.  Aspects 10.  CT Angie of head and neck no large vessel occlusion. CT perfusion study of the brain shows a small area of penumbra in the right MCA territory with no core. IV TPA given   STROKE: Possibly stroke not seen on MRI  Suspected Etiology of symptoms: athero vs cardioembolic source ,  Resultant Symptoms: dysarthria, left facial droop and left-sided neglect.  Stroke Risk Factors: atrial fibrillation, hyperlipidemia and hypertension Other Stroke Risk Factors: Advanced age, Coronary artery disease  Outstanding Stroke Work-up Studies:     TEE /Loop Recorder:                               to be scheduled as an outpatient  12/08/2017: She presented with dysarthria and left hemiparesis likely due to embolic right MCA infarct not seen on imaging.  She received TPA and needs close neurological monitoring and strict blood pressure control as per post TPA protocol.  Continue ongoing stroke workup.  Discussed with patient and family and answered questions.  May transfer to the floor later today  12/09/2017: Neuro exam remains stable.  Left-sided weakness slowly improving.  Patient appears slightly weaker than her baseline on exam this morning. States has not walked with PT and would need to be able to do this to return home safely. Patient would like to go CIR to get back to her baseline abilities.  Will re-consult therapies and CIR for possible admission. Plavix added today.  PLAN  12/09/2017: Continue Statin Added Plavix  Frequent neuro checks Telemetry monitoring PT/OT/SLP Consult PM & Rehab - awaiting decision admission/insurance approval Outpatient TEE and Loop Recorder Placement Ongoing aggressive stroke risk factor management Patient counseled to be compliant with her antithrombotic medications Patient counseled on Lifestyle modifications including, Diet, Exercise, and Stress Follow up with [facility] in 6 weeks  INTRACRANIAL Atherosclerosis &Stenosis: Severe stenosis of a right MCA mid M2 branch vessel   DYSPHAGIA: PassedSLP swallow evaluation- Dysphagia 2 Aspiration Precautions in progress  R/O AFIB: Outpatient TEE and Loop Recorder Placement Will likely need long-term anticoagulation  Patient was on Coumadin a few years ago after developing DVT.  Developed epidural hematoma and Coumadin discontinued.  IVC filter was placed.  MEDICAL ISSUES: Anemia - Stable Hemoglobin 11.3 hematocrit 33.9 Repeat labs in a.m.  Thrombocytopenia - Resolved Platelet count 159 today  HYPERTENSION: Stable SBP goal of < 180. DBP goal of < 105.  Labetolol PRN Long term BP goal normotensive. May slowly restart home B/P medications after 48 hours Home Meds: Imdur, metoprolol.  Metoprolol restarted 12/08/2017,  Imdur held for now  HYPERLIPIDEMIA:    Component Value Date/Time   CHOL 133 12/08/2017 0309   TRIG 98 12/08/2017 0309   HDL 63 12/08/2017 0309   CHOLHDL 2.1 12/08/2017 0309   VLDL 20 12/08/2017 0309   LDLCALC 50 12/08/2017 0309  Home Meds: Crestor 20 mg LDL  goal < 70 Continued on Crestor 20 mg daily Continue statin at discharge  R/O DIABETES: Lab Results  Component Value Date   HGBA1C 5.7 (H) 12/08/2017  HgbA1c goal < 7.0  Other Active Problems: Active Problems:   Stroke (cerebrum) [facility])  Hospital day # 2 VTE prophylaxis: SCD's  Diet :  DIET DYS 2 Room service appropriate? Yes; Fluid consistency: Nectar Thick   FAMILY UPDATES: family at bedside  TEAM UPDATES: Garvin Fila, MD     Prior Home Stroke Medications:  aspirin 81 mg daily  Discharge Stroke Meds:  Please discharge patient on Plavix 75 mg daily  Disposition: 01-Home or Self Care Therapy Recs:               CIR versus home Home Equipment:         None Follow Up:  Follow-up Information    Garvin Fila, MD. Schedule an appointment as soon as  possible for a visit in 6 week(s).   Specialties:  Neurology, Radiology Contact information: [address] [place] [address] [phone]        [name], MD. Schedule an appointment as soon as possible for a visit in 1 week(s).   Specialty:  Family Medicine Contact information: [address] [place] [address] [phone]        [place]. Call.   Why:  NEEDS TEE AND LOOP RECORDER PLACEMENT PRIOR TO DISCHARGE FROM CIR Contact information: [place] [address] [phone]         [name], MD -PCP Follow up in 1-2 weeks    [facility] Stroke Neurology Team 12/09/2017 4:28 PM I have personally examined this patient, reviewed notes, independently viewed imaging studies, participated in medical decision making and plan of care.ROS completed by me personally and pertinent positives fully documented  I have made any additions or clarifications directly to the above note. Agree with note above.Transfer to rehab when bed available and after insurance approval  Antony Contras, MD Medical Director [place] Pager: [phone] 12/09/2017 6:21 PM  To contact Stroke Continuity provider, please refer to [email or web address] After hours, contact General Neurology

## 2017-12-09 NOTE — Progress Notes (Signed)
Physical Therapy Treatment Patient Details Name: Molly [name] MRN: [id number] DOB: 28-Oct-1929 Today's Date: 12/09/2017    History of Present Illness Molly [name] is a 83 y.o. female PHMx: of CAD, hyperlipidemia, HTN, A-fib, vertigo, back sx a couple of years ago with continued RLE weakness and foot drop presenting to the emergency room with left-sided weakness and inability to stand. TPA was given. CT negative, awaiting MRI    PT Comments    Pt presents as very fatigued this morning possibly from multiple visits from health care providers as pt states she has been very busy this morning. Pt is limited by R sided weakness LE>UE and decreased stability. Pt is currently modA for bed mobility, sit<>stand transfers and L lateral stepping 2 feet to recliner. Per pt's daughter this is well below her baseline of being able to get up from bed and walk to her bathroom in the morning with her Rollator. D/c plans to CIR given pts motivation to get back to her baseline and her 24/7 support at eventual discharge home. PT will continue to follow acutely.   Follow Up Recommendations  CIR     Equipment Recommendations  None recommended by PT    Recommendations for Other Services Rehab consult     Precautions / Restrictions Precautions Precautions: Fall Required Braces or Orthoses: Other Brace/Splint Other Brace/Splint: pt has an AFO at home for her right leg, but it is too heavy for her to use.  Restrictions Weight Bearing Restrictions: No    Mobility  Bed Mobility Overal bed mobility: Needs Assistance Bed Mobility: Rolling;Supine to Sit     Supine to sit: HOB elevated;Mod assist     General bed mobility comments: modA for R LE movement off EoB, and scoot of hips to EoB vc for use fo R UE to pull self to EoB pt unable to reach R UE all the way to bed rail   Transfers Overall transfer level: Needs assistance Equipment used: Rolling walker (2 wheeled) Transfers: Sit to/from  Stand Sit to Stand: From elevated surface;Mod assist         General transfer comment: modA for power up with R LE blocked, vc for anterior pelvic tilt, requires increased time to come to upright and rotate hips for steady standing (minA for )  Ambulation/Gait Ambulation/Gait assistance: Mod assist Ambulation Distance (Feet): 2 Feet Assistive device: Rolling walker (2 wheeled) Gait Pattern/deviations: Step-to pattern Gait velocity: slowed Gait velocity interpretation: Below normal speed for age/gender General Gait Details: modA for support to advance L LE, pt unable to clear R foot from floor only able to offweight enough to slide along floor, Pt requires increased time and vc for stepping    Modified Rankin (Stroke Patients Only) Modified Rankin (Stroke Patients Only) Pre-Morbid Rankin Score: Moderately severe disability Modified Rankin: Moderately severe disability     Balance Overall balance assessment: Needs assistance Sitting-balance support: Bilateral upper extremity supported;Feet supported Sitting balance-Leahy Scale: Fair Sitting balance - Comments: close supervision EOB   Standing balance support: Bilateral upper extremity supported Standing balance-Leahy Scale: Poor Standing balance comment: needs assist and support of RW                            Cognition Arousal/Alertness: Awake/alert Behavior During Therapy: WFL for tasks assessed/performed Overall Cognitive Status: Within Functional Limits for tasks assessed  General Comments: Not specifically tested, for tasks assessed she was able to follow basic commands and communicate her needs         General Comments General comments (skin integrity, edema, etc.): Pt daughter present throughout session.      Pertinent Vitals/Pain Pain Assessment: Faces Faces Pain Scale: Hurts little more Pain Location: L hip  Pain Descriptors / Indicators:  Grimacing;Guarding Pain Intervention(s): Limited activity within patient's tolerance;Monitored during session;Repositioned    Home Living Family/patient expects to be discharged to:: Inpatient rehab Living Arrangements: Children Available Help at Discharge: Family;Available 24 hours/day Type of Home: House Home Access: Ramped entrance   Home Layout: One level Home Equipment: Walker - 4 wheels;Bedside commode;Shower seat;Hand held shower head Additional Comments: Pt has also has hired A that comes in 4 days a week and helps with basic ADLs    Prior Function Level of Independence: Needs assistance  Gait / Transfers Assistance Needed: Uses a rollator with someone always close by. Pt and dtr both report that pt does normally move slow due to h/o vertigo (but now moving even slower) ADL's / Homemaking Assistance Needed: Does some of her UB ADLs and has A with [facility] ADLS     PT Goals (current goals can now be found in the care plan section) Acute Rehab PT Goals Patient Stated Goal: family would like CIR PT Goal Formulation: With patient/family Time For Goal Achievement: 12/22/17 Potential to Achieve Goals: Good Progress towards PT goals: PT to reassess next treatment    Frequency    Min 4X/week      PT Plan Current plan remains appropriate       AM-PAC PT "6 Clicks" Daily Activity  Outcome Measure  Difficulty turning over in bed (including adjusting bedclothes, sheets and blankets)?: Unable Difficulty moving from lying on back to sitting on the side of the bed? : Unable Difficulty sitting down on and standing up from a chair with arms (e.g., wheelchair, bedside commode, etc,.)?: Unable Help needed moving to and from a bed to chair (including a wheelchair)?: A Lot Help needed walking in hospital room?: A Lot Help needed climbing 3-5 steps with a railing? : A Lot 6 Click Score: 9    End of Session Equipment Utilized During Treatment: Gait belt Activity Tolerance: Patient  limited by pain;Patient limited by fatigue Patient left: in bed;in CPM;with family/visitor present Nurse Communication: Mobility status PT Visit Diagnosis: Muscle weakness (generalized) (M62.81);Difficulty in walking, not elsewhere classified (R26.2);Other symptoms and signs involving the nervous system (P53.614)     Time: 4315-4008 PT Time Calculation (min) (ACUTE ONLY): 40 min  Charges:  $Therapeutic Activity: 38-52 mins                    G Codes:       Ahnesty Finfrock B. Migdalia Dk PT, DPT Acute Rehabilitation  [phone] Pager [phone]     [place] 12/09/2017, 11:48 AM

## 2017-12-09 NOTE — Progress Notes (Signed)
Patient iv infilitrated nurse called into room.  2 NT's had performed full bed change; gown change again x 3; After removing iv caretaker stated, "patient says she is soaked."  Checked bed and underpad which both felt dry.  Caretaker still insisting patient was wet, so nurse replaced bottom pad and green chuck again.  Placed order for iv team.

## 2017-12-09 NOTE — Progress Notes (Signed)
I met with pt and her daughter that she lives with at bedside. Daughter states pt is not at her baseline and has definite deficits since this new CVA. She is requesting an inpt rehab admit prior to d/c home. I have asked PT to reassess patient  this am and I will discuss with my admitting MD, Dr. Naaman Plummer. CIR admit pending his approval and insurance approval today. I will follow up today. [phone]

## 2017-12-09 NOTE — PMR Pre-admission (Signed)
PMR Admission Coordinator Pre-Admission Assessment  Patient: Molly [name] is an 83 y.o., female MRN: [id number] DOB: 17-Sep-1929 Height: 5\' 2"  (157.5 cm) Weight: 76 kg (167 lb 8.8 oz)              Insurance Information HMO: yes    PPO:      PCP:      IPA:      80/20:      OTHER: medicare advantage plan PRIMARY: [employer] Medicare   Policy#: [id number]    Subscriber: pt CM Name: Orvan July      Phone#: [phone]     Fax#: [phone] Pre-Cert#: [id number] approved for 7 days with f/u with Vevelyn Royals phone [phone] fax [phone]      Employer: retired Benefits:  Phone #: [phone]     Name: 12/09/2017 Eff. Date: 02/10/2017     Deduct: none      Out of Pocket Max: $4400      Life Max: none CIR: $345 co pay per day days 1 to 5      SNF:  No co pay days 1-20; $160 co pay per day days 21-48; no co pay days 49- 100 Outpatient: $40 co pay per visit     Co-Pay:  Visits per medical neccesity Home Health: 100%      Co-Pay: visits per medical neccesity DME: 80%     Co-Pay: 20% Providers: in network  SECONDARY: none       Medicaid Application Date:       Case Manager:  Disability Application Date:       Case Worker:   Emergency Facilities manager Information    Name Relation Home Work Mobile   [place] Daughter [phone]     [name] Daughter [phone]       Current Medical History  Patient Admitting Diagnosis: CVA  History of Present Illness:  HPI: Wynona Foglemanis a 83 y.o.femalewith history of CAD, h/o PE/DVT (after back surgery), RLE weakness, vertigo, Afib in the past--no anticoagulation due to resolution, HTN who was admitted on 12/07/17 with left sided weakness, rightward gaze, difficulty speaking and inability to walk. CTA/P done revealing abnormal perfusion right frontal operculum with severe stenosis of right MCA mid M2 brach, no emergent large vessel infarct and aortic atherosclerosis. She was treated with tPA and MRI  brain pending. 2 D echo with EF 55-60% with no wall abnormality and moderately calcified AV and calcified mitral annulus. She has had issues with hypotension past admission requiring fluid boluses.   MRI brain 12/27 was negative for acute ischemia. Neurology felt that patient with R-MCA embolic stroke not seen on MRI and outpatient TEE with loop recorder recommended. ASA changed to Plavix due to intracranial stenosis. She was placed on dysphagia 2, nectar liquids due to mild to moderate sensory oropharyngeal dysphagia.   Total: 1 NIHSS    Past Medical History  Past Medical History:  Diagnosis Date  . Acid reflux   . Anemia   . Arthritis   . CAD (coronary artery disease)   . High cholesterol   . Hyperlipidemia   . Hypertension   . Osteoporosis   . Spinal stenosis     Family History  family history includes Anemia in her mother; Heart attack in her brother.  Prior Rehab/Hospitalizations:  Has the patient had major surgery during 100 days prior to admission? No   Previously has AIR at [facility] after back surgery 2 years ago and went home with daughter after discharged  Current  Medications   Current Facility-Administered Medications:  .  0.9 %  sodium chloride infusion, , Intravenous, Continuous, Rosalin Hawking, MD, Last Rate: 50 mL/hr at 12/11/17 1246 .  acetaminophen (TYLENOL) tablet 650 mg, 650 mg, Oral, Q4H PRN, 650 mg at 12/11/17 2248 **OR** acetaminophen (TYLENOL) solution 650 mg, 650 mg, Per Tube, Q4H PRN **OR** acetaminophen (TYLENOL) suppository 650 mg, 650 mg, Rectal, Q4H PRN, Amie Portland, MD .  calcium carbonate (TUMS - dosed in mg elemental calcium) chewable tablet 400 mg of elemental calcium, 2 tablet, Oral, Once, Greta Doom, MD .  clopidogrel (PLAVIX) tablet 75 mg, 75 mg, Oral, Daily, Costello, Mary A, NP, 75 mg at 12/12/17 0853 .  isosorbide mononitrate (IMDUR) 24 hr tablet 30 mg, 30 mg, Oral, BID, Rosalin Hawking, MD, 30 mg at 12/12/17 0854 .  metoprolol  succinate (TOPROL-XL) 24 hr tablet 25 mg, 25 mg, Oral, Daily, Costello, Mary A, NP, 25 mg at 12/12/17 0854 .  pantoprazole (PROTONIX) EC tablet 40 mg, 40 mg, Oral, Daily, Rosalin Hawking, MD, 40 mg at 12/12/17 0854 .  RESOURCE THICKENUP CLEAR, , Oral, PRN, Garvin Fila, MD .  rosuvastatin (CRESTOR) tablet 20 mg, 20 mg, Oral, QHS, Costello, Mary A, NP, 20 mg at 12/11/17 2214 .  senna-docusate (Senokot-S) tablet 2 tablet, 2 tablet, Oral, BID, Rosalin Hawking, MD, 2 tablet at 12/12/17 1610  Patients Current Diet: DIET DYS 2 Room service appropriate? Yes; Fluid consistency: Nectar Thick  Precautions / Restrictions Precautions Precautions: Fall Other Brace/Splint: pt has an AFO at home for her right leg, but it is too heavy for her to use.  Restrictions Weight Bearing Restrictions: No   Has the patient had 2 or more falls or a fall with injury in the past year?No  Prior Activity Level Limited Community (1-2x/wk): active in the home pta  [place] Devices/Equipment: Cane (specify quad or straight), Walker (specify type) Home Equipment: Walker - 4 wheels, Bedside commode, Shower seat, Hand held shower head  Prior Device Use: Indicate devices/aids used by the patient prior to current illness, exacerbation or injury? Walker  Prior Functional Level Prior Function Level of Independence: Needs assistance Gait / Transfers Assistance Needed: Uses a rollator with someone always close by. Pt and dtr both report that pt does normally move slow due to h/o vertigo (but now moving even slower) ADL's / Homemaking Assistance Needed: Does some of her UB ADLs and has A with LB ADLS Comments: has caregiver assist for LB bathing and dressing and to get in and out of shower  Self Care: Did the patient need help bathing, dressing, using the toilet or eating?  Needed some help  Indoor Mobility: Did the patient need assistance with walking from room to room (with or without  device)? Needed some help  Stairs: Did the patient need assistance with internal or external stairs (with or without device)? Needed some help  Functional Cognition: Did the patient need help planning regular tasks such as shopping or remembering to take medications? Needed some help  Current Functional Level Cognition  Arousal/Alertness: Awake/alert Overall Cognitive Status: Difficult to assess Orientation Level: Oriented X4 General Comments: Not specifically tested, for tasks assessed she was able to follow basic commands and communicate her needs Comments: Overall decreased processing; unsure if this is baseline vs extension of previous mental status    Extremity Assessment (includes Sensation/Coordination)  Upper Extremity Assessment: Defer to OT evaluation RUE Deficits / Details: generalized weakness LUE Deficits / Details:  Can move arm for movements asked but quite slow compared to RUE  Lower Extremity Assessment: RLE deficits/detail, LLE deficits/detail RLE Deficits / Details: right leg with baseline weakness from back surgery.  R foot drop DF 0/5, knee extension 3-/5, hip flexion 3-/5 LLE Deficits / Details: left leg with generalized weakness ankle DF 3/5, knee extension 3/5, hip flexion 3/5 per seated MMT    ADLs  Overall ADL's : Needs assistance/impaired Eating/Feeding: Supervision/ safety, Set up, Bed level Grooming: Bed level, Minimal assistance Upper Body Bathing: Minimal assistance, Bed level Lower Body Bathing: Total assistance Lower Body Bathing Details (indicate cue type and reason): Mod A sit<>stand Upper Body Dressing : Maximal assistance Upper Body Dressing Details (indicate cue type and reason): sitting EOB Lower Body Dressing: Total assistance Lower Body Dressing Details (indicate cue type and reason): Mod A sit<>stand Toilet Transfer Details (indicate cue type and reason): Mod A sit<>stand; min A stand and side step Toileting- Clothing Manipulation and  Hygiene: Total assistance Toileting - Clothing Manipulation Details (indicate cue type and reason): Mod A sit<>stand    Mobility  Overal bed mobility: Needs Assistance Bed Mobility: Rolling, Supine to Sit Rolling: Mod assist Supine to sit: HOB elevated, Mod assist Sit to supine: Mod assist General bed mobility comments: modA for R LE movement off EoB, and scoot of hips to EoB vc for use fo R UE to pull self to EoB pt unable to reach R UE all the way to bed rail     Transfers  Overall transfer level: Needs assistance Equipment used: Rolling walker (2 wheeled) Transfers: Sit to/from Stand Sit to Stand: From elevated surface, Mod assist General transfer comment: modA for power up with R LE blocked, vc for anterior pelvic tilt, requires increased time to come to upright and rotate hips for steady standing (minA for )    Ambulation / Gait / Stairs / Wheelchair Mobility  Ambulation/Gait Ambulation/Gait assistance: Mod assist Ambulation Distance (Feet): 2 Feet Assistive device: Rolling walker (2 wheeled) Gait Pattern/deviations: Step-to pattern General Gait Details: modA for support to advance L LE, pt unable to clear R foot from floor only able to offweight enough to slide along floor, Pt requires increased time and vc for stepping Gait velocity: slowed Gait velocity interpretation: Below normal speed for age/gender    Posture / Balance Dynamic Sitting Balance Sitting balance - Comments: close supervision EOB Balance Overall balance assessment: Needs assistance Sitting-balance support: Bilateral upper extremity supported, Feet supported Sitting balance-Leahy Scale: Fair Sitting balance - Comments: close supervision EOB Standing balance support: Bilateral upper extremity supported Standing balance-Leahy Scale: Poor Standing balance comment: needs assist and support of RW    Special needs/care consideration BiPAP/CPAP  N/a CPM  N/a Continuous Drip IV  N/a Dialysis  N/a Life Vest   N/a Oxygen  N/a Special Bed  N/a Trach Size  N/a Wound Vac n/a Skin ecchymosis BUE Bowel mgmt: incontinent LBM 12/31 Bladder mgmt: external catheter Diabetic mgmt n/a   Previous Home Environment Living Arrangements: Children(lives with a daughter)  Lives With: Daughter Available Help at Discharge: Family, Available 24 hours/day Type of Home: House Home Layout: One level Home Access: Ramped entrance Bathroom Shower/Tub: (grab bars at toilet and shower) Bathroom Toilet: Handicapped height Bathroom Accessibility: Yes How Accessible: Accessible via walker [place]: (private caregiver as needed) Additional Comments: Pt has also has hired A that comes in 4 days a week and helps with basic ADLs  Discharge Living Setting Plans for Discharge Living Setting: Lives  with (comment)(daughter) Type of Home at Discharge: House Discharge Home Layout: One level Discharge Home Access: [place] entrance Discharge Bathroom Shower/Tub: Walk-in shower Discharge Bathroom Toilet: Handicapped height Discharge Bathroom Accessibility: Yes How Accessible: Accessible via walker Does the patient have any problems obtaining your medications?: No  Social/Family/Support Systems Patient Roles: Parent Contact Information: [name], daughter Anticipated Caregiver: laura and hired caregiver Anticipated Ambulance person Information: see above Ability/Limitations of Caregiver: daughter works from home Caregiver Availability: 24/7 Discharge Plan Discussed with Primary Caregiver: Yes Is Caregiver In Agreement with Plan?: Yes Does Caregiver/Family have Issues with Lodging/Transportation while Pt is in Rehab?: No  Goals/Additional Needs Patient/Family Goal for Rehab: supervision with PT, supervision to min assist OT, supervision with SLP Expected length of stay: ELOS 10- 14 days Pt/Family Agrees to Admission and willing to participate: Yes Program Orientation Provided & Reviewed with Pt/Caregiver  Including Roles  & Responsibilities: Yes  Decrease burden of Care through IP rehab admission: n/a  Possible need for SNF placement upon discharge:not anticipated  Patient Condition: This patient's medical and functional status has changed since the consult dated 12/08/2017 in which the Rehabilitation Physician documented that the patient was not appropriate for intensive rehabilitative care in an inpatient rehabilitation facility. Due to change in status and issues being addressed, patient's case has been discussed with Dr. Posey Pronto and patient now appropriate for inpatient rehabilitation. Will admit to inpatient rehab today.  Preadmission Screen Completed By:  Cleatrice Burke, 12/12/2017 8:56 AM ______________________________________________________________________   Discussed status with Dr. Posey Pronto on 12/12/2017 at [phone] and received telephone approval for admission today.  Admission Coordinator:  Cleatrice Burke, time 9604 Date 12/12/2017

## 2017-12-09 NOTE — Progress Notes (Signed)
[facility] has not made a decision concerning admission to inpt rehab for today. I have met with pt's daughter at bedside, Butch Penny, and spoken with [name], by phone. They are aware. I will follow up on Monday for possible admit pending insurance approval. Stanton Kidney NP, for Dr. Leonie Man, made aware. [phone]

## 2017-12-10 ENCOUNTER — Inpatient Hospital Stay (HOSPITAL_COMMUNITY): Payer: Medicare Other

## 2017-12-10 ENCOUNTER — Encounter: Payer: Self-pay | Admitting: Interventional Cardiology

## 2017-12-10 ENCOUNTER — Inpatient Hospital Stay (HOSPITAL_COMMUNITY): Payer: Medicare Other | Admitting: Occupational Therapy

## 2017-12-10 ENCOUNTER — Inpatient Hospital Stay (HOSPITAL_COMMUNITY): Payer: Medicare Other | Admitting: Physical Therapy

## 2017-12-10 DIAGNOSIS — E785 Hyperlipidemia, unspecified: Secondary | ICD-10-CM

## 2017-12-10 DIAGNOSIS — G459 Transient cerebral ischemic attack, unspecified: Secondary | ICD-10-CM

## 2017-12-10 DIAGNOSIS — Z86718 Personal history of other venous thrombosis and embolism: Secondary | ICD-10-CM

## 2017-12-10 MED ORDER — FAMOTIDINE 20 MG PO TABS: 20.0000 mg | ORAL_TABLET | Freq: Once | ORAL | Status: DC

## 2017-12-10 MED ORDER — PANTOPRAZOLE SODIUM 40 MG PO TBEC
40.0000 mg | DELAYED_RELEASE_TABLET | Freq: Every day | ORAL | Status: DC
  Administered 2017-12-10 – 2017-12-12 (×3): 40 mg via ORAL
  Filled 2017-12-10 (×3): qty 1

## 2017-12-10 MED ORDER — SENNOSIDES-DOCUSATE SODIUM 8.6-50 MG PO TABS
2.0000 | ORAL_TABLET | Freq: Two times a day (BID) | ORAL | Status: DC
  Administered 2017-12-10 – 2017-12-12 (×5): 2 via ORAL
  Filled 2017-12-10 (×6): qty 2
  Filled 2017-12-10: qty 1
  Filled 2017-12-10: qty 2

## 2017-12-10 MED ORDER — CALCIUM CARBONATE ANTACID 500 MG PO CHEW
2.0000 | CHEWABLE_TABLET | Freq: Once | ORAL | Status: DC
  Filled 2017-12-10 (×2): qty 2

## 2017-12-10 NOTE — Progress Notes (Signed)
Dtr voices concern over pt's speech is more slurry, jaw droping, and pocketing liquid in mouth on the right side.

## 2017-12-10 NOTE — Progress Notes (Signed)
  Speech Language Pathology Treatment: Dysphagia  Patient Details Name: Molly [name] MRN: [id number] DOB: 02-12-1929 Today's Date: 12/10/2017 Time: 4536-4680 SLP Time Calculation (min) (ACUTE ONLY): 30 min  Assessment / Plan / Recommendation Clinical Impression  Patient seen with daughter present in room for trials of thin liquids as well as education and discussion of plan of care regarding patient's dysphagia. Per patient's daughter, she seems more lethargic and weak today, and yesterday she saliva was "pouring out of her mouth". Patient reported (daughter confirmed) that she ate all of her oatmeal, half her grits and eggs this morning. SLP administered teaspoon sips of thin liquids to decrease right-sided anterior spillage. Patient exhibited one cough response out of a total of 10 teaspoon sips. She did not exhibit any overt s/s of aspiration or penetration or difficulty with swallowing puree solids. Patient, daughter and SLP all in agreement to keep diet as is right now (Dys 2, Nectar thick liquids), but to allow for her to have teaspoon sips of thin liquid water between meals. Plan for MBS in next couple days.    HPI HPI:  83 y.o. female past medical history of coronary artery disease, hyperlipidemia, hypertension, atrial fibrillation at some point in the past on no anticoagulation because of resolution of A. fib at that time, presenting to the emergency room with a last known normal of 9:15 AM on 12/07/2017 with left-sided weakness and inability to stand when she wanted to get up from the breakfast table.`      SLP Plan  Continue with current plan of care;MBS       Recommendations  Diet recommendations: Dysphagia 2 (fine chop);Nectar-thick liquid;Other(comment)(may have teaspoon sips thin liquids between meals) Liquids provided via: Teaspoon;Cup Medication Administration: Whole meds with puree Supervision: Patient able to self feed;Staff to assist with self feeding;Full  supervision/cueing for compensatory strategies Compensations: Slow rate;Small sips/bites Postural Changes and/or Swallow Maneuvers: Seated upright 90 degrees                Oral Care Recommendations: Oral care BID Follow up Recommendations: Inpatient Rehab;Home health SLP SLP Visit Diagnosis: Dysphagia, oropharyngeal phase (R13.12) Plan: Continue with current plan of care;MBS       [place], MA, CCC-SLP 12/10/17 2:00 PM

## 2017-12-10 NOTE — Progress Notes (Signed)
NEUROHOSPITALISTS STROKE TEAM - DAILY PROGRESS NOTE   SUBJECTIVE (INTERVAL HISTORY) Daughter is at the bedside. Patient is lying in bed in NAD. Daughter denies any hx of afib in the past. She did admit that pt had back surgery, post surgery she developed DVT and was put on coumadin, however developed spinal epidural hematoma from the surgical area. Coumadin stopped and IVC filter placed at that time.    OBJECTIVE Lab Results: CBC:  Recent Labs  Lab 12/07/17 1012 12/07/17 1021 12/08/17 0309 12/09/17 0425  WBC 6.1  --  4.8 5.6  HGB 13.1 12.6 11.2* 11.3*  HCT 38.7 37.0 34.0* 33.9*  MCV 96.5  --  95.8 96.3  PLT 172  --  149* 159   BMP: Recent Labs  Lab 12/07/17 1012 12/07/17 1021 12/09/17 0425  NA 140 140 139  K 4.5 4.4 3.6  CL 107 108 109  CO2 24  --  23  GLUCOSE 101* 96 91  BUN 13 14 9   CREATININE 0.91 0.90 0.66  CALCIUM 9.5  --  8.9  MG  --   --  2.0  PHOS  --   --  2.9   Liver Function Tests:  Recent Labs  Lab 12/07/17 1012  AST 36  ALT 24  ALKPHOS 34*  BILITOT 0.7  PROT 7.0  ALBUMIN 3.9   Coagulation Studies:  Recent Labs    12/07/17 1012  APTT 31  INR 1.01    Urinalysis:  Recent Labs  Lab 12/07/17 1150  COLORURINE YELLOW  APPEARANCEUR CLEAR  LABSPEC 1.025  PHURINE 6.0  GLUCOSEU NEGATIVE  HGBUR NEGATIVE  BILIRUBINUR NEGATIVE  KETONESUR NEGATIVE  PROTEINUR NEGATIVE  NITRITE NEGATIVE  LEUKOCYTESUR NEGATIVE   Urine Drug Screen:     Component Value Date/Time   LABOPIA NONE DETECTED 12/07/2017 1150   COCAINSCRNUR NONE DETECTED 12/07/2017 1150   LABBENZ NONE DETECTED 12/07/2017 1150   AMPHETMU NONE DETECTED 12/07/2017 1150   THCU NONE DETECTED 12/07/2017 1150   LABBARB NONE DETECTED 12/07/2017 1150    Alcohol Level:  Recent Labs  Lab 12/07/17 1012  ETH <10   PHYSICAL EXAM Temp:  [97.6 F (36.4 C)-99 F (37.2 C)] 97.6 F (36.4 C) (12/29 0541) Pulse Rate:  [58-70] 62 (12/29  0541) Resp:  [18-20] 18 (12/29 0541) BP: (112-166)/(48-60) 159/60 (12/29 0541) SpO2:  [96 %-100 %] 96 % (12/29 0541) General - Well nourished, well developed, in no apparent distress Respiratory - Lungs clear bilaterally. No wheezing. Cardiovascular - Regular rate and rhythm  Neurological exam Awake, alert, follows all commands, answers all questions appropriately.  Cranial nerves: Pupils equal round reactive to light, visual field full, mild dysarthria, palate elevates symmetrically, shoulder shrug intact, tongue midline. Motor exam: She is able to lift all 4 extremities antigravity with a minimal vertical drift in the right LE and foot drop. Sensory exam: symmetrical as per pt Coordination: Slow to perform but intact finger nose finger bilaterally Gait was not tested for patient safety.  IMAGING: I have personally reviewed the radiological images below and agree with the radiology interpretations.  Ct Angio Head, Perfusion and Neck W Or Wo Contrast Result Date: 12/07/2017 IMPRESSION:  1. No emergent large vessel occlusion.  2. Small region of abnormal perfusion in the right frontal operculum without evidence of core infarct utilizing CBF <30% threshold.  3. Severe stenosis of a right MCA mid M2 branch vessel corresponding to the perfusion abnormality.  4. Mild cervical carotid artery atherosclerosis without stenosis.  5.  Aortic Atherosclerosis (ICD10-I70.0).   Mr Brain Wo Contrast Result Date: 12/08/2017 IMPRESSION:  Negative noncontrast MRI of the head for age.   Ct Head Code Stroke Wo Contrast Result Date: 12/07/2017 IMPRESSION:  1. Noncontrast CT appearance of the brain is stable since 2015 and normal for age.  2. ASPECTS is 10.    Echocardiogram:  Study Conclusions - Left ventricle: The cavity size was normal. Wall thickness was   normal. Systolic function was normal. The estimated ejection   fraction was in the range of 55% to 60%. Wall motion was normal;   there  were no regional wall motion abnormalities. Doppler   parameters are consistent with abnormal left ventricular   relaxation (grade 1 diastolic dysfunction). - Aortic valve: There was mild regurgitation. - Mitral valve: Calcified annulus. Impressions: - Normal LV systolic function; mild diastolic dysfunction; mild AI;   trace MR and TR.     ASSESSMENT: [name] is a 83 y.o. female with PMH of coronary artery disease, hyperlipidemia, hypertension, atrial fibrillation on no anticoagulation presenting to the emergency room with a last known normal of 9:15 AM on 12/07/2017 with left-sided weakness and inability to stand. NIHSS 10. CT-scan of the brain -no acute change.  Aspects 10.  CT Angio of head and neck no large vessel occlusion. CT perfusion study of the brain shows a small area of penumbra in the right MCA territory with no core. IV TPA given Wed 12/26  STROKE: Possibly stroke not seen on MRI  Suspected Etiology of symptoms: athero vs cardioembolic source ,  Resultant Symptoms: dysarthria, left facial droop and left-sided neglect. Stroke Risk Factors: atrial fibrillation, hyperlipidemia and hypertension Other Stroke Risk Factors: Advanced age, Coronary artery disease  Outstanding Stroke Work-up Studies:      TEE /Loop Recorder: to be scheduled as an outpatient  PLAN  12/10/2017: Continue Statin Added Plavix  Frequent neuro checks Telemetry monitoring PT/OT/SLP Consult PM & Rehab - awaiting decision admission/insurance approval Outpatient TEE and Loop Recorder Placement Ongoing aggressive stroke risk factor management Patient counseled to be compliant with her antithrombotic medications Patient counseled on Lifestyle modifications including, Diet, Exercise, and Stress Follow up with [facility] in 6 weeks  INTRACRANIAL Atherosclerosis &Stenosis: Severe stenosis of a right MCA mid M2 branch vessel   DYSPHAGIA: PassedSLP swallow evaluation-  Dysphagia 2 Aspiration Precautions in progress  R/O AFIB: Outpatient TEE and Loop Recorder Placement Will likely need long-term anticoagulation  Patient was on Coumadin a few years ago after developing DVT.  Developed epidural hematoma and Coumadin discontinued.  IVC filter was placed.  MEDICAL ISSUES: Anemia - Stable Hemoglobin 11.3 hematocrit 33.9 Repeat labs in a.m.  Thrombocytopenia - Resolved Platelet count 159 today  HYPERTENSION: Stable SBP goal of < 180. DBP goal of < 105.  Labetolol PRN Long term BP goal normotensive. May slowly restart home B/P medications after 48 hours Home Meds: Imdur, metoprolol.  Metoprolol restarted 12/08/2017,  Imdur held for now  HYPERLIPIDEMIA:    Component Value Date/Time   CHOL 133 12/08/2017 0309   TRIG 98 12/08/2017 0309   HDL 63 12/08/2017 0309   CHOLHDL 2.1 12/08/2017 0309   VLDL 20 12/08/2017 0309   LDLCALC 50 12/08/2017 0309  Home Meds: Crestor 20 mg LDL  goal < 70 Continued on Crestor 20 mg daily Continue statin at discharge  R/O DIABETES: Lab Results  Component Value Date   HGBA1C 5.7 (H) 12/08/2017  HgbA1c goal < 7.0  Other Active Problems: Active Problems:  Stroke (cerebrum) [facility])  Hospital day # 3 VTE prophylaxis: SCD's  Diet : DIET DYS 2 Room service appropriate? Yes; Fluid consistency: Nectar Thick   FAMILY UPDATES: family at bedside  TEAM UPDATES: Garvin Fila, MD     Prior Home Stroke Medications:  aspirin 81 mg daily  Discharge Stroke Meds:  Please discharge patient on Plavix 75 mg daily  Disposition: 01-Home or Self Care Therapy Recs:               CIR versus home Home Equipment:         None Follow Up:  Follow-up Information    Garvin Fila, MD. Schedule an appointment as soon as possible for a visit in 6 week(s).   Specialties:  Neurology, Radiology Contact information: [address] [place] [address] [phone]        [name], MD. Schedule an  appointment as soon as possible for a visit in 1 week(s).   Specialty:  Family Medicine Contact information: [address] [place] [address] [phone]        [place]. Call.   Why:  NEEDS TEE AND LOOP RECORDER PLACEMENT PRIOR TO DISCHARGE FROM CIR Contact information: [place] [address] [phone]         [name], MD -PCP Follow up in 1-2 weeks    Attending note: I reviewed above note and agree with the assessment and plan. I have made any additions or clarifications directly to the above note. Pt was seen and examined.  83 year old female with history of CAD, HTN, HLD, spinal stenosis status post surgery complicated by DVT and IVC filter admitted for left sided weakness and right-sided gaze.  TPA given.  CTA head and neck showed right MCA mid M2 stenosis.  MRI negative, EF 55-60%.  LDL 50, A1c 5.7.   Episode is likely aborted stroke with right M2 stenosis.  Antiplatelet change from aspirin 81 to Plavix, continue Crestor.  Agree with outpatient TEE and loop recorder.  PT/OT recommend CIR.  Rosalin Hawking, MD PhD Stroke Neurology 12/10/2017 11:22 PM   To contact Stroke Continuity provider, please refer to [email or web address] After hours, contact General Neurology

## 2017-12-11 DIAGNOSIS — I639 Cerebral infarction, unspecified: Secondary | ICD-10-CM

## 2017-12-11 LAB — CBC
HCT: 32.3 % — ABNORMAL LOW (ref 36.0–46.0)
Hemoglobin: 10.5 g/dL — ABNORMAL LOW (ref 12.0–15.0)
MCH: 31.5 pg (ref 26.0–34.0)
MCHC: 32.5 g/dL (ref 30.0–36.0)
MCV: 97 fL (ref 78.0–100.0)
PLATELETS: 150 10*3/uL (ref 150–400)
RBC: 3.33 MIL/uL — AB (ref 3.87–5.11)
RDW: 13.4 % (ref 11.5–15.5)
WBC: 4.9 10*3/uL (ref 4.0–10.5)

## 2017-12-11 LAB — BASIC METABOLIC PANEL
Anion gap: 3 — ABNORMAL LOW (ref 5–15)
BUN: 14 mg/dL (ref 6–20)
CO2: 23 mmol/L (ref 22–32)
Calcium: 8.5 mg/dL — ABNORMAL LOW (ref 8.9–10.3)
Chloride: 115 mmol/L — ABNORMAL HIGH (ref 101–111)
Creatinine, Ser: 0.69 mg/dL (ref 0.44–1.00)
GFR calc Af Amer: >60 mL/min (ref >60)
GLUCOSE: 121 mg/dL — AB (ref 65–99)
POTASSIUM: 3.3 mmol/L — AB (ref 3.5–5.1)
Sodium: 141 mmol/L (ref 135–145)

## 2017-12-11 MED ORDER — POTASSIUM CHLORIDE 20 MEQ PO PACK
40.0000 meq | PACK | Freq: Two times a day (BID) | ORAL | Status: AC
  Administered 2017-12-11 (×2): 40 meq via ORAL
  Filled 2017-12-11 (×3): qty 2

## 2017-12-11 NOTE — Progress Notes (Signed)
NEUROHOSPITALISTS STROKE TEAM - DAILY PROGRESS NOTE   SUBJECTIVE (INTERVAL HISTORY) Daughters are at the bedside. Patient is lying in bed in NAD. Pt ate nectar thick liquid ok as per daughter. K 3.3, will supplement.   OBJECTIVE Lab Results: CBC:  Recent Labs  Lab 12/07/17 1012 12/07/17 1021 12/08/17 0309 12/09/17 0425  WBC 6.1  --  4.8 5.6  HGB 13.1 12.6 11.2* 11.3*  HCT 38.7 37.0 34.0* 33.9*  MCV 96.5  --  95.8 96.3  PLT 172  --  149* 159   BMP: Recent Labs  Lab 12/07/17 1012 12/07/17 1021 12/09/17 0425  NA 140 140 139  K 4.5 4.4 3.6  CL 107 108 109  CO2 24  --  23  GLUCOSE 101* 96 91  BUN 13 14 9   CREATININE 0.91 0.90 0.66  CALCIUM 9.5  --  8.9  MG  --   --  2.0  PHOS  --   --  2.9   Liver Function Tests:  Recent Labs  Lab 12/07/17 1012  AST 36  ALT 24  ALKPHOS 34*  BILITOT 0.7  PROT 7.0  ALBUMIN 3.9   Coagulation Studies:  No results for input(s): APTT, INR in the last 72 hours.  Urinalysis:  Recent Labs  Lab 12/07/17 1150  COLORURINE YELLOW  APPEARANCEUR CLEAR  LABSPEC 1.025  PHURINE 6.0  GLUCOSEU NEGATIVE  HGBUR NEGATIVE  BILIRUBINUR NEGATIVE  KETONESUR NEGATIVE  PROTEINUR NEGATIVE  NITRITE NEGATIVE  LEUKOCYTESUR NEGATIVE   Urine Drug Screen:     Component Value Date/Time   LABOPIA NONE DETECTED 12/07/2017 1150   COCAINSCRNUR NONE DETECTED 12/07/2017 1150   LABBENZ NONE DETECTED 12/07/2017 1150   AMPHETMU NONE DETECTED 12/07/2017 1150   THCU NONE DETECTED 12/07/2017 1150   LABBARB NONE DETECTED 12/07/2017 1150    Alcohol Level:  Recent Labs  Lab 12/07/17 1012  ETH <10   PHYSICAL EXAM Temp:  [97.6 F (36.4 C)-98.9 F (37.2 C)] 97.6 F (36.4 C) (12/30 0210) Pulse Rate:  [56-65] 60 (12/30 0210) Resp:  [17-19] 18 (12/30 0210) BP: (100-144)/(39-63) 132/51 (12/30 0210) SpO2:  [96 %-99 %] 96 % (12/30 0210) General - Well nourished, well developed, in no apparent  distress Respiratory - Lungs clear bilaterally. No wheezing. Cardiovascular - Regular rate and rhythm  Neurological exam Awake, alert, follows all commands, answers all questions appropriately.  Cranial nerves: Pupils equal round reactive to light, visual field full, mild dysarthria, palate elevates symmetrically, shoulder shrug intact, tongue midline. Motor exam: She is able to lift all 4 extremities antigravity with a minimal vertical drift in the right LE and foot drop. Sensory exam: symmetrical as per pt Coordination: Slow to perform but intact finger nose finger bilaterally Gait was not tested for patient safety.  IMAGING: I have personally reviewed the radiological images below and agree with the radiology interpretations.  Ct Angio Head, Perfusion and Neck W Or Wo Contrast Result Date: 12/07/2017 IMPRESSION:  1. No emergent large vessel occlusion.  2. Small region of abnormal perfusion in the right frontal operculum without evidence of core infarct utilizing CBF <30% threshold.  3. Severe stenosis of a right MCA mid M2 branch vessel corresponding to the perfusion abnormality.  4. Mild cervical carotid artery atherosclerosis without stenosis.  5.  Aortic Atherosclerosis (ICD10-I70.0).   Mr Brain Wo Contrast Result Date: 12/08/2017 IMPRESSION:  Negative noncontrast MRI of the head for age.   Ct Head Code Stroke Wo Contrast Result Date: 12/07/2017 IMPRESSION:  1. Noncontrast  CT appearance of the brain is stable since 2015 and normal for age.  2. ASPECTS is 10.    Echocardiogram:  Study Conclusions - Left ventricle: The cavity size was normal. Wall thickness was   normal. Systolic function was normal. The estimated ejection   fraction was in the range of 55% to 60%. Wall motion was normal;   there were no regional wall motion abnormalities. Doppler   parameters are consistent with abnormal left ventricular   relaxation (grade 1 diastolic dysfunction). - Aortic valve:  There was mild regurgitation. - Mitral valve: Calcified annulus. Impressions: - Normal LV systolic function; mild diastolic dysfunction; mild AI;   trace MR and TR.     ASSESSMENT: Molly [name] is a 83 y.o. female with PMH of coronary artery disease, hyperlipidemia, hypertension, atrial fibrillation on no anticoagulation presenting to the emergency room with a last known normal of 9:15 AM on 12/07/2017 with left-sided weakness and inability to stand. NIHSS 10. CT-scan of the brain -no acute change.  Aspects 10.  CT Angio of head and neck no large vessel occlusion. CT perfusion study of the brain shows a small area of penumbra in the right MCA territory with no core. IV TPA given Wed 12/26  STROKE: Possibly stroke not seen on MRI  Suspected Etiology of symptoms: athero vs cardioembolic source ,  Resultant Symptoms: dysarthria, left facial droop and left-sided neglect. Stroke Risk Factors: atrial fibrillation, hyperlipidemia and hypertension Other Stroke Risk Factors: Advanced age, Coronary artery disease  Outstanding Stroke Work-up Studies:      TEE /Loop Recorder: to be scheduled as an outpatient  PLAN  12/11/2017: Continue Statin Added Plavix  Frequent neuro checks Telemetry monitoring PT/OT/SLP - CIR recommended Outpatient TEE and Loop Recorder Placement Ongoing aggressive stroke risk factor management Patient counseled to be compliant with her antithrombotic medications Patient counseled on Lifestyle modifications including, Diet, Exercise, and Stress Follow up with Dr. Leonie Man at [facility] in 6 weeks  INTRACRANIAL Atherosclerosis &Stenosis: Severe stenosis of a right MCA mid M2 branch vessel   DYSPHAGIA: PassedSLP swallow evaluation- Dysphagia 2 Aspiration Precautions in progress Speech following  R/O AFIB: Outpatient TEE and Loop Recorder Placement - follow up with Dr. Leonie Man as outpt to arrange. Patient was on Coumadin a few years ago after  developing DVT.  Developed epidural hematoma and Coumadin discontinued.  IVC filter was placed.  MEDICAL ISSUES: Anemia - Stable Hemoglobin 11.3 -> 10.5 Repeat labs in a.m.  HYPERTENSION: Stable Long term BP goal normotensive.  HYPERLIPIDEMIA:    Component Value Date/Time   CHOL 133 12/08/2017 0309   TRIG 98 12/08/2017 0309   HDL 63 12/08/2017 0309   CHOLHDL 2.1 12/08/2017 0309   VLDL 20 12/08/2017 0309   LDLCALC 50 12/08/2017 0309  Home Meds: Crestor 20 mg LDL  goal < 70 Continued on Crestor 20 mg daily Continue statin at discharge  R/O DIABETES: Lab Results  Component Value Date   HGBA1C 5.7 (H) 12/08/2017  HgbA1c goal < 7.0  Other Active Problems: Active Problems:   Stroke (cerebrum) [facility])  Hospital day # 4 VTE prophylaxis: SCD's  Diet : DIET DYS 2 Room service appropriate? Yes; Fluid consistency: Nectar Thick   FAMILY UPDATES: family at bedside  TEAM UPDATES: Rosalin Hawking, MD     Prior Home Stroke Medications:  aspirin 81 mg daily  Discharge Stroke Meds:  Please discharge patient on Plavix 75 mg daily  Disposition: 01-Home or Self Care Therapy Recs:  CIR versus home Home Equipment:         None Follow Up:  Follow-up Information    Garvin Fila, MD. Schedule an appointment as soon as possible for a visit in 6 week(s).   Specialties:  Neurology, Radiology Contact information: [address] [place] [address] [phone]        [name], MD. Schedule an appointment as soon as possible for a visit in 1 week(s).   Specialty:  Family Medicine Contact information: [address] [place] [address] [phone]        [place]. Call.   Why:  NEEDS TEE AND LOOP RECORDER PLACEMENT PRIOR TO DISCHARGE FROM CIR Contact information: [place] [address] [phone]         [name], MD -PCP  Follow up in 1-2 weeks    Attending note: I reviewed above note and agree with the assessment and plan. I have made any additions or clarifications directly to the above note. Pt was seen and examined.  83 year old female with history of CAD, HTN, HLD, spinal stenosis status post surgery complicated by DVT and epidural hematoma on coumadin and then s/p IVC filter admitted for left sided weakness and right-sided gaze.  TPA given.  CTA head and neck showed right MCA mid M2 stenosis.  MRI negative, EF 55-60%.  LDL 50, A1c 5.7.   Episode is likely aborted stroke with right M2 stenosis.  Antiplatelet change from aspirin 81 to Plavix, continue Crestor.  Agree with outpatient TEE and loop recorder when follow with Dr. Leonie Man in clinic. K was low today, given supplement. PT/OT recommend CIR. Pending CIR placement.   Rosalin Hawking, MD PhD Stroke Neurology 12/11/2017 11:41 AM   To contact Stroke Continuity provider, please refer to [email or web address] After hours, contact General Neurology

## 2017-12-12 ENCOUNTER — Other Ambulatory Visit: Payer: Self-pay

## 2017-12-12 ENCOUNTER — Inpatient Hospital Stay (HOSPITAL_COMMUNITY)
Admission: RE | Admit: 2017-12-12 | Discharge: 2017-12-21 | DRG: 057 | Disposition: A | Discharge status: Home Health | Payer: Medicare Other | Source: Intra-hospital | Attending: Physical Medicine & Rehabilitation | Admitting: Physical Medicine & Rehabilitation

## 2017-12-12 ENCOUNTER — Encounter (HOSPITAL_COMMUNITY): Payer: Self-pay | Admitting: General Practice

## 2017-12-12 ENCOUNTER — Encounter (HOSPITAL_COMMUNITY): Payer: Self-pay | Admitting: *Deleted

## 2017-12-12 DIAGNOSIS — Z91048 Other nonmedicinal substance allergy status: Secondary | ICD-10-CM | POA: Diagnosis not present

## 2017-12-12 DIAGNOSIS — Z888 Allergy status to other drugs, medicaments and biological substances status: Secondary | ICD-10-CM

## 2017-12-12 DIAGNOSIS — I10 Essential (primary) hypertension: Secondary | ICD-10-CM

## 2017-12-12 DIAGNOSIS — Z7982 Long term (current) use of aspirin: Secondary | ICD-10-CM | POA: Diagnosis not present

## 2017-12-12 DIAGNOSIS — I6601 Occlusion and stenosis of right middle cerebral artery: Secondary | ICD-10-CM

## 2017-12-12 DIAGNOSIS — K219 Gastro-esophageal reflux disease without esophagitis: Secondary | ICD-10-CM

## 2017-12-12 DIAGNOSIS — G8194 Hemiplegia, unspecified affecting left nondominant side: Secondary | ICD-10-CM | POA: Diagnosis not present

## 2017-12-12 DIAGNOSIS — Z86711 Personal history of pulmonary embolism: Secondary | ICD-10-CM

## 2017-12-12 DIAGNOSIS — G4700 Insomnia, unspecified: Secondary | ICD-10-CM | POA: Diagnosis present

## 2017-12-12 DIAGNOSIS — Z885 Allergy status to narcotic agent status: Secondary | ICD-10-CM | POA: Diagnosis not present

## 2017-12-12 DIAGNOSIS — D62 Acute posthemorrhagic anemia: Secondary | ICD-10-CM | POA: Diagnosis present

## 2017-12-12 DIAGNOSIS — Z882 Allergy status to sulfonamides status: Secondary | ICD-10-CM

## 2017-12-12 DIAGNOSIS — Z7409 Other reduced mobility: Secondary | ICD-10-CM | POA: Diagnosis present

## 2017-12-12 DIAGNOSIS — E785 Hyperlipidemia, unspecified: Secondary | ICD-10-CM | POA: Diagnosis present

## 2017-12-12 DIAGNOSIS — I69354 Hemiplegia and hemiparesis following cerebral infarction affecting left non-dominant side: Principal | ICD-10-CM

## 2017-12-12 DIAGNOSIS — I69391 Dysphagia following cerebral infarction: Secondary | ICD-10-CM

## 2017-12-12 DIAGNOSIS — M48061 Spinal stenosis, lumbar region without neurogenic claudication: Secondary | ICD-10-CM

## 2017-12-12 DIAGNOSIS — M810 Age-related osteoporosis without current pathological fracture: Secondary | ICD-10-CM | POA: Diagnosis present

## 2017-12-12 DIAGNOSIS — R1312 Dysphagia, oropharyngeal phase: Secondary | ICD-10-CM | POA: Diagnosis present

## 2017-12-12 DIAGNOSIS — I2510 Atherosclerotic heart disease of native coronary artery without angina pectoris: Secondary | ICD-10-CM | POA: Diagnosis present

## 2017-12-12 DIAGNOSIS — I480 Paroxysmal atrial fibrillation: Secondary | ICD-10-CM | POA: Diagnosis present

## 2017-12-12 DIAGNOSIS — Z90710 Acquired absence of both cervix and uterus: Secondary | ICD-10-CM

## 2017-12-12 DIAGNOSIS — I69398 Other sequelae of cerebral infarction: Secondary | ICD-10-CM | POA: Diagnosis not present

## 2017-12-12 DIAGNOSIS — M792 Neuralgia and neuritis, unspecified: Secondary | ICD-10-CM | POA: Diagnosis present

## 2017-12-12 DIAGNOSIS — I951 Orthostatic hypotension: Secondary | ICD-10-CM

## 2017-12-12 DIAGNOSIS — R269 Unspecified abnormalities of gait and mobility: Secondary | ICD-10-CM | POA: Diagnosis not present

## 2017-12-12 DIAGNOSIS — M4800 Spinal stenosis, site unspecified: Secondary | ICD-10-CM | POA: Diagnosis present

## 2017-12-12 DIAGNOSIS — G629 Polyneuropathy, unspecified: Secondary | ICD-10-CM | POA: Diagnosis present

## 2017-12-12 DIAGNOSIS — E876 Hypokalemia: Secondary | ICD-10-CM | POA: Diagnosis present

## 2017-12-12 DIAGNOSIS — I63511 Cerebral infarction due to unspecified occlusion or stenosis of right middle cerebral artery: Secondary | ICD-10-CM | POA: Diagnosis not present

## 2017-12-12 DIAGNOSIS — K5900 Constipation, unspecified: Secondary | ICD-10-CM | POA: Diagnosis present

## 2017-12-12 DIAGNOSIS — Z79899 Other long term (current) drug therapy: Secondary | ICD-10-CM | POA: Diagnosis not present

## 2017-12-12 DIAGNOSIS — M21371 Foot drop, right foot: Secondary | ICD-10-CM | POA: Diagnosis present

## 2017-12-12 DIAGNOSIS — K5901 Slow transit constipation: Secondary | ICD-10-CM

## 2017-12-12 LAB — BASIC METABOLIC PANEL
ANION GAP: 5 (ref 5–15)
BUN: 10 mg/dL (ref 6–20)
CALCIUM: 8.5 mg/dL — AB (ref 8.9–10.3)
CHLORIDE: 113 mmol/L — AB (ref 101–111)
CO2: 22 mmol/L (ref 22–32)
Creatinine, Ser: 0.61 mg/dL (ref 0.44–1.00)
GFR calc Af Amer: >60 mL/min (ref >60)
GFR calc non Af Amer: >60 mL/min (ref >60)
GLUCOSE: 103 mg/dL — AB (ref 65–99)
Potassium: 4.3 mmol/L (ref 3.5–5.1)
Sodium: 140 mmol/L (ref 135–145)

## 2017-12-12 LAB — CBC
HEMATOCRIT: 31.5 % — AB (ref 36.0–46.0)
HEMOGLOBIN: 10.5 g/dL — AB (ref 12.0–15.0)
MCH: 31.6 pg (ref 26.0–34.0)
MCHC: 33.3 g/dL (ref 30.0–36.0)
MCV: 94.9 fL (ref 78.0–100.0)
Platelets: 146 10*3/uL — ABNORMAL LOW (ref 150–400)
RBC: 3.32 MIL/uL — ABNORMAL LOW (ref 3.87–5.11)
RDW: 13.4 % (ref 11.5–15.5)
WBC: 5.5 10*3/uL (ref 4.0–10.5)

## 2017-12-12 MED ORDER — ENOXAPARIN SODIUM 40 MG/0.4ML ~~LOC~~ SOLN
40.0000 mg | SUBCUTANEOUS | Status: DC
  Administered 2017-12-12 – 2017-12-16 (×5): 40 mg via SUBCUTANEOUS
  Filled 2017-12-12 (×5): qty 0.4

## 2017-12-12 MED ORDER — POLYETHYLENE GLYCOL 3350 17 G PO PACK: 17.0000 g | PACK | Freq: Every day | ORAL | Status: DC | PRN

## 2017-12-12 MED ORDER — PANTOPRAZOLE SODIUM 40 MG PO TBEC
40.0000 mg | DELAYED_RELEASE_TABLET | Freq: Every day | ORAL | Status: DC
  Administered 2017-12-13 – 2017-12-21 (×9): 40 mg via ORAL
  Filled 2017-12-12 (×9): qty 1

## 2017-12-12 MED ORDER — BISACODYL 10 MG RE SUPP: 10.0000 mg | Freq: Every day | RECTAL | Status: DC | PRN

## 2017-12-12 MED ORDER — PROCHLORPERAZINE 25 MG RE SUPP: 12.5000 mg | Freq: Four times a day (QID) | RECTAL | Status: DC | PRN

## 2017-12-12 MED ORDER — ROSUVASTATIN CALCIUM 10 MG PO TABS
20.0000 mg | ORAL_TABLET | Freq: Every day | ORAL | Status: DC
  Administered 2017-12-12 – 2017-12-20 (×9): 20 mg via ORAL
  Filled 2017-12-12 (×9): qty 2

## 2017-12-12 MED ORDER — ACETAMINOPHEN 325 MG PO TABS
325.0000 mg | ORAL_TABLET | ORAL | Status: DC | PRN
  Administered 2017-12-15 – 2017-12-20 (×6): 650 mg via ORAL
  Filled 2017-12-12 (×6): qty 2

## 2017-12-12 MED ORDER — CLOPIDOGREL BISULFATE 75 MG PO TABS
75.0000 mg | ORAL_TABLET | Freq: Every day | ORAL | Status: DC
  Administered 2017-12-13 – 2017-12-21 (×9): 75 mg via ORAL
  Filled 2017-12-12 (×9): qty 1

## 2017-12-12 MED ORDER — ISOSORBIDE MONONITRATE ER 30 MG PO TB24
30.0000 mg | ORAL_TABLET | Freq: Two times a day (BID) | ORAL | Status: DC
  Administered 2017-12-12 – 2017-12-17 (×10): 30 mg via ORAL
  Filled 2017-12-12 (×10): qty 1

## 2017-12-12 MED ORDER — ACETAMINOPHEN 325 MG PO TABS
650.0000 mg | ORAL_TABLET | Freq: Every day | ORAL | Status: DC
  Administered 2017-12-12 – 2017-12-20 (×9): 650 mg via ORAL
  Filled 2017-12-12 (×9): qty 2

## 2017-12-12 MED ORDER — PROCHLORPERAZINE EDISYLATE 5 MG/ML IJ SOLN: 5.0000 mg | Freq: Four times a day (QID) | INTRAMUSCULAR | Status: DC | PRN

## 2017-12-12 MED ORDER — RESOURCE THICKENUP CLEAR PO POWD
ORAL | Status: DC | PRN
Start: 2017-12-12 — End: 2017-12-12

## 2017-12-12 MED ORDER — FLEET ENEMA 7-19 GM/118ML RE ENEM: 1.0000 | ENEMA | Freq: Once | RECTAL | Status: DC | PRN

## 2017-12-12 MED ORDER — SENNOSIDES-DOCUSATE SODIUM 8.6-50 MG PO TABS
2.0000 | ORAL_TABLET | Freq: Two times a day (BID) | ORAL | Status: DC
  Administered 2017-12-14: 1 via ORAL
  Administered 2017-12-14 – 2017-12-21 (×14): 2 via ORAL
  Filled 2017-12-12 (×17): qty 2

## 2017-12-12 MED ORDER — DIPHENHYDRAMINE HCL 12.5 MG/5ML PO ELIX: 12.5000 mg | ORAL_SOLUTION | Freq: Four times a day (QID) | ORAL | Status: DC | PRN

## 2017-12-12 MED ORDER — GUAIFENESIN-DM 100-10 MG/5ML PO SYRP: 5.0000 mL | ORAL_SOLUTION | Freq: Four times a day (QID) | ORAL | Status: DC | PRN

## 2017-12-12 MED ORDER — METOPROLOL SUCCINATE ER 25 MG PO TB24
25.0000 mg | ORAL_TABLET | Freq: Every day | ORAL | Status: DC
  Administered 2017-12-13 – 2017-12-17 (×5): 25 mg via ORAL
  Filled 2017-12-12 (×5): qty 1

## 2017-12-12 MED ORDER — CALCIUM CARBONATE-VITAMIN D 500-200 MG-UNIT PO TABS
1.0000 | ORAL_TABLET | Freq: Two times a day (BID) | ORAL | Status: DC
  Administered 2017-12-12 – 2017-12-21 (×18): 1 via ORAL
  Filled 2017-12-12 (×18): qty 1

## 2017-12-12 MED ORDER — ALUM & MAG HYDROXIDE-SIMETH 200-200-20 MG/5ML PO SUSP: 30.0000 mL | ORAL | Status: DC | PRN

## 2017-12-12 MED ORDER — PROCHLORPERAZINE MALEATE 5 MG PO TABS: 5.0000 mg | ORAL_TABLET | Freq: Four times a day (QID) | ORAL | Status: DC | PRN

## 2017-12-12 MED ORDER — GABAPENTIN 100 MG PO CAPS
100.0000 mg | ORAL_CAPSULE | Freq: Every day | ORAL | Status: DC
  Administered 2017-12-15 – 2017-12-20 (×6): 100 mg via ORAL
  Filled 2017-12-12 (×8): qty 1

## 2017-12-12 MED ORDER — TRAZODONE HCL 50 MG PO TABS: 25.0000 mg | ORAL_TABLET | Freq: Every evening | ORAL | Status: DC | PRN

## 2017-12-12 NOTE — Progress Notes (Signed)
Report given to 4W.  Patient taken to unit by bed.  All belongings with patient.

## 2017-12-12 NOTE — Progress Notes (Signed)
  Speech Language Pathology Treatment: Dysphagia  Patient Details Name: Molly [name] MRN: [id number] DOB: Mar 25, 1929 Today's Date: 12/12/2017 Time: 8546-2703 SLP Time Calculation (min) (ACUTE ONLY): 35 min  Assessment / Plan / Recommendation Clinical Impression  Second SLP skilled session focused on speech, compensations for oral dysphagia, education re: aspiration pna risk and environmental factors.  Pt with thick whitish lingual coating - had pt brush her tongue in attempts to remove with verbal/visual cues *use of mirror.  Do not view whitish patches in posterior oral cavity that may be consistent with oral candidiasis.  Advised pt and daughter to brush her tongue frequently today to help clearance.    Decreased lingual strength noted especially on left and pt admits to increased difficulty with solids requiring mastication.  Pt able to readily swallow her pills whole with applesauce without delay today! Suspect she may benefit from magic cup or liquid nutritional supplement if RD agrees.   Viscous secretions noted which is concerning for hydration and pt is NOT drinking thicker liquids.  Educated daughter to use of Provale cup with demonstration and teach back for reinforcement.    Eating is a laborious process for pt due to her lingual/oral weakness and highly recommend encouraging snacks between meals.  Recommend dietician consult to help maximize pt's intake/nutrition.    Pt sleeps with dentures in at night and SLP advised against this behavior due to increased risk of bacteria aspiration.  Using teach back, daughter and pt educated thoroughly to plan and agreeable.      HPI HPI:  83 y.o. female past medical history of coronary artery disease, hyperlipidemia, hypertension, atrial fibrillation at some point in the past on no anticoagulation because of resolution of A. fib at that time, presenting to the emergency room with a last known normal of 9:15 AM on 12/07/2017 with left-sided  weakness and inability to stand when she wanted to get up from the breakfast table.`      SLP Plan  Continue with current plan of care       Recommendations  Diet recommendations: Dysphagia 2 (fine chop);Thin liquid Liquids provided via: Cup(Provale cup) Medication Administration: Whole meds with puree Supervision: Patient able to self feed;Staff to assist with self feeding;Full supervision/cueing for compensatory strategies Compensations: Slow rate;Small sips/bites;Chin tuck Postural Changes and/or Swallow Maneuvers: Seated upright 90 degrees                General recommendations: Other(comment)(pt to go to CIR today) Oral Care Recommendations: Oral care QID Follow up Recommendations: Inpatient Rehab;Home health SLP SLP Visit Diagnosis: Dysphagia, oropharyngeal phase (R13.12) Plan: Continue with current plan of care       GO              Molly [name], [facility] SLP [phone]   Molly [name] 12/12/2017, 9:25 AM

## 2017-12-12 NOTE — Progress Notes (Signed)
  Speech Language Pathology Treatment:    Patient Details Name: Molly [name] MRN: [id number] DOB: [birth year] Today's Date: 12/12/2017 Time: 4259-5638 SLP Time Calculation (min) (ACUTE ONLY): 30 min  Assessment / Plan / Recommendation Clinical Impression  Session focused on dysphagia treatment, readiness for dietary advancement clinically vs instrumental evaluation and education.  Pt's daughter reports pt is NOT drinking due to displeasure with thickened liquids.    Multiple trials of thin conducted via cup/straw/tsp with inconsistent cough response.  Obvious oral control issues noted which is likely causing premature spillage of liquids into pharynx/airway.  Chin tuck posture trialed but not successful with cup/straw boluses of thin as significant cough continued with approximately 20% of trials.  SLP obtained 5 CC Provale cup for pt which she successfully used in combination with chin tuck to completely eliminate coughing with intake.    Pt's voice is hoarse and her daughter reports this is worse in the am - known h/o GERD.  Pt admits to having GERD prior to being given a PPI in the hospital.  Also noted pt with premorbid h/o occasional coughing with her lemonade at home - suspicion for baseline dysphagia.  She continues with oral control/delays but with cueing to "swallow fast and hard" she demonstrates improvement.  Recommend advance die to dys2/thin with strict precautions.    Given premorbid dysphagia, suspect exacerbation currently due to his suspected CVA.  MBS not recommended at this time if pt tolerates thin liquids via Provale cup as suspect primary oral deficits for which pt is currently compensating.  Using teach back, pt/daughter educated.    HPI HPI:  83 y.o. female past medical history of coronary artery disease, hyperlipidemia, hypertension, atrial fibrillation at some point in the past on no anticoagulation because of resolution of A. fib at that time, presenting to the  emergency room with a last known normal of 9:15 AM on 12/07/2017 with left-sided weakness and inability to stand when she wanted to get up from the breakfast table.`      SLP Plan  Continue with current plan of care       Recommendations  Diet recommendations: Dysphagia 2 (fine chop);Thin liquid Liquids provided via: Cup(Provale cup) Medication Administration: Whole meds with puree Supervision: Patient able to self feed;Staff to assist with self feeding;Full supervision/cueing for compensatory strategies Compensations: Slow rate;Small sips/bites;Chin tuck Postural Changes and/or Swallow Maneuvers: Seated upright 90 degrees                Oral Care Recommendations: Oral care QID Follow up Recommendations: Inpatient Rehab;Home health SLP SLP Visit Diagnosis: Dysphagia, oropharyngeal phase (R13.12) Plan: Continue with current plan of care       GO              Molly [name], [facility] SLP [phone]   Molly [name] 12/12/2017, 9:16 AM

## 2017-12-12 NOTE — Progress Notes (Signed)
I have insurance approval and bed available to admit pt today. Pt and daughter at bedside are aware. I have notified Dr. Erlinda Hong, RN am and SW. I will make the arrangements to admit today. [phone]

## 2017-12-12 NOTE — Progress Notes (Signed)
Molly [name], Molly Salk, MD      Letta Pate Molly Salk, MD  Physician  Physical Medicine and Rehabilitation      Consult Note  Signed     Date of Service:  12/08/2017 10:16 AM         Related encounter: ED to Hosp-Admission (Discharged) from 12/07/2017 in [place] [facility]           Signed          Expand All Collapse All            Expand widget buttonCollapse widget button     Hide copied text   Hover for detailscustomization button                                                                                                         untitled image              Physical Medicine and Rehabilitation Consult        Reason for Consult: Inability to walk  Referring Physician: Leonie [name]        HPI: Molly [name] is a 83 y.o. female with history of CAD, h/o PE/DVT (after back surgery), RLE weakness, vertigo,  Afib in the past--no anticoagulation due to resolution, HTN who was admitted on 12/07/17 with left sided weakness, rightward gaze, difficulty speaking and inability to walk. CTA/P done revealing abnormal perfusion right frontal operculum with severe stenosis of right MCA mid M2 brach, no emergent large vessel infarct and aortic atherosclerosis. She was treated with tPA and MRI brain pending.  2 D echo with EF 55-60% with no wall abnormality and moderately calcified AV and calcified mitral annulus. She has had issues with hypotension requiring fluid boluses. Therapy evaluations in progress  and CIR recommended due to functional deficits.      Lives with daughter and independent with rollator--needs supervision with mobility due to right foot drop since lumbar surgery ~34yrs ago (drags her foot). 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 She has hired help to assist with  ADLs.       Denies any falls or to admission.  Per daughter does not appear to be any different from a mental status standpoint        Review of Systems   Constitutional: Negative for fever.   HENT: Negative for hearing loss.    Eyes: Positive for blurred vision (wear glasses). Negative for double vision.   Respiratory: Negative for cough and shortness of breath.    Cardiovascular: Negative for chest pain and palpitations.   Gastrointestinal: Positive for constipation and heartburn.   Genitourinary: Positive for frequency. Negative for dysuria.   Musculoskeletal: Positive for back pain. Negative for myalgias.   Skin: Negative for itching and rash.   Neurological: Positive for dizziness (with quick movements), speech change and focal weakness. Negative for headaches.   Psychiatric/Behavioral: The patient does not have insomnia.  Past Medical History:    Diagnosis   Date    .   Acid reflux        .   Anemia        .   Arthritis        .   CAD (coronary artery disease)        .   High cholesterol        .   Hyperlipidemia        .   Hypertension        .   Osteoporosis        .   Spinal stenosis                    Past Surgical History:    Procedure   Laterality   Date    .   ABDOMINAL HYSTERECTOMY            .   BACK SURGERY                Rod and Pins Placed     .   CARDIAC CATHETERIZATION   N/A   11/17/2015        Procedure: Left Heart Cath and Coronary Angiography;  Surgeon: Jettie Booze, MD;  Location: [facility];  Service: Cardiovascular;  Laterality: N/A;    .   CAROTID STENT                        Family History    Problem   Relation   Age of Onset    .   Anemia   Mother        .   Heart attack   Brother              Social History:  Lives with daughter. Needed distant  supervision prior to admission. She reports that  has never smoked. she has never used smokeless tobacco. She reports that she does not drink alcohol or use drugs.               Allergies    Allergen   Reactions    .   Buprenorphine Hcl   Shortness Of Breath            Chest pain    .   Codeine   Nausea And Vomiting    .   Hydrocodone   Nausea And Vomiting    .   Morphine And Related   Shortness Of Breath            Chest pain    .   Sulfa Antibiotics   Nausea And Vomiting    .   Sulfacetamide Sodium   Nausea And Vomiting    .   Vicodin [Hydrocodone-Acetaminophen]   Nausea And Vomiting    .   Evista [Raloxifene]   Other (See Comments) and Nausea And Vomiting            Upset stomach    .   Hydromorphone   Nausea And Vomiting            Sensitive    .   Other   Other (See Comments)            Narcotic-like medications - does NOT tolerate pain meds, causes unrelating volatile vomiting and drops in BP below 80/40 with dizziness and fainting     Tourniquet - Please place tourniquet over  cloth - causes sever bruising.     Blood pressure cuff - please use manual blood pressure cuff - causes excoriating pain using machine and bruising       .   Sulfamethoxazole   Nausea And Vomiting    .   Sulfamethoxazole-Trimethoprim   Nausea And Vomiting    .   Sulfur   Nausea And Vomiting            Vomited until bleeding occurred and developed an active stomach ulcer    .   Tape   Other (See Comments)            Rips skin    .   Morphine   Nausea Only            Made me crazy                 Medications Prior to Admission    Medication   Sig   Dispense   Refill    .   acetaminophen (TYLENOL) 500 MG tablet   Take 500 mg by mouth every 8 (eight) hours as needed for mild pain.             Marland Kitchen   aspirin EC 81 MG tablet   Take  81 mg by mouth every morning.            .   denosumab (PROLIA) 60 MG/ML SOLN injection   Inject 60 mg into the skin every 6 (six) months. Administer in upper arm, thigh, or abdomen            .   gabapentin (NEURONTIN) 100 MG capsule   Take 100 mg by mouth at bedtime as needed (nerve pain).             .   Glucosamine-Chondroitin (OSTEO BI-FLEX REGULAR STRENGTH PO)   Take 1 capsule by mouth 2 (two) times daily.            .   isosorbide mononitrate (IMDUR) 30 MG 24 hr tablet   Take 1 tablet (30 mg total) by mouth 2 (two) times daily.   180 tablet   3    .   metoprolol succinate (TOPROL-XL) 25 MG 24 hr tablet   Take 1 tablet (25 mg total) by mouth daily. (Patient taking differently: Take 25 mg by mouth every evening. )   90 tablet   3    .   Misc Natural Products (NARCOSOFT HERBAL LAX PO)   Take 2 capsules by mouth 2 (two) times daily.             .   nitroGLYCERIN (NITROSTAT) 0.4 MG SL tablet   Place 0.4 mg under the tongue every 5 (five) minutes as needed for chest pain (x 3 pills).             .   Omega-3 Fatty Acids (FISH OIL) 1000 MG CAPS   Take 1,000 mg by mouth 2 (two) times daily.            Marland Kitchen   omeprazole (PRILOSEC) 40 MG capsule   Take 40 mg by mouth daily.            .   polysaccharide iron (NIFEREX) 150 MG CAPS capsule   Take 150 mg by mouth every morning.            .   Probiotic Product (ALIGN) 4 MG CAPS   Take 1 tablet by mouth every  morning.            .   rosuvastatin (CRESTOR) 20 MG tablet   Take 20 mg by mouth at bedtime.            .   sennosides-docusate sodium (SENOKOT-S) 8.6-50 MG tablet   Take 2 tablets by mouth 2 (two) times daily.             .   TURMERIC PO   Take 1 tablet by mouth daily.            .   vitamin C (ASCORBIC ACID) 250 MG tablet   Take 250 mg by mouth daily.                  Home:  Home  Living  Family/patient expects to be discharged to:: Inpatient rehab  Living Arrangements: Children  Available Help at Discharge: Family, Available 24 hours/day  Type of Home: House  Home Access: [place]: One level  Bathroom Shower/Tub: Gaffer, Curtain  Bathroom Toilet: Handicapped height  Home Equipment: Environmental consultant - 4 wheels, Bedside commode, Shower seat, Hand held shower head  Additional Comments: Pt has also has hired A that comes in 4 days a week and helps with basic ADLs   Functional History:  Prior Function  Level of Independence: Needs assistance  Gait / Transfers Assistance Needed: Uses a rollator with someone always close by. Pt and dtr both report that pt does normally move slow due to h/o vertigo (but now moving even slower)  ADL's / Homemaking Assistance Needed: Does some of her UB ADLs and has A with [facility] ADLS  Functional Status:   Mobility:  Bed Mobility  Overal bed mobility: Needs Assistance  Bed Mobility: Supine to Sit, Sit to Supine  Supine to sit: Mod assist, HOB elevated(bed pad to help pivot around and scoot to EOB; A for trunk)  Sit to supine: Max assist(HOB flat; A for legs and trunk)  General bed mobility comments: Pt moves very slow  Transfers  Overall transfer level: Needs assistance  Equipment used: Rolling walker (2 wheeled)  Transfers: Sit to/from Stand  Sit to Stand: Mod assist  General transfer comment: pt with tendency for RLE so slide forward with sit>stand and needs increased time to come to full upright standing         ADL:  ADL  Overall ADL's : Needs assistance/impaired  Eating/Feeding: Supervision/ safety, Set up, Bed level  Grooming: Bed level, Minimal assistance  Upper Body Bathing: Minimal assistance, Bed level  Lower Body Bathing: Total assistance  Lower Body Bathing Details (indicate cue type and reason): Mod A sit<>stand  Upper Body Dressing : Maximal  assistance  Upper Body Dressing Details (indicate cue type and reason): sitting EOB  Lower Body Dressing: Total assistance  Lower Body Dressing Details (indicate cue type and reason): Mod A sit<>stand  Toilet Transfer Details (indicate cue type and reason): Mod A sit<>stand; min A stand and side step  Toileting- Clothing Manipulation and Hygiene: Total assistance  Toileting - Clothing Manipulation Details (indicate cue type and reason): Mod A sit<>stand     Cognition:  Cognition  Overall Cognitive Status: Within Functional Limits for tasks assessed  Orientation Level: Oriented X4  Cognition  Arousal/Alertness: Awake/alert  Behavior During Therapy: WFL for tasks assessed/performed  Overall Cognitive Status: Within Functional Limits for tasks assessed        Blood pressure (!) 135/58, pulse 68, temperature 98.2 F (36.8 C),  temperature source Oral, resp. rate 16, height 5\' 3"  (1.6 m), weight 75.5 kg (166 lb 7.2 oz), SpO2 100 %.  Physical Exam   Nursing note and vitals reviewed.  Constitutional: She is oriented to person, place, and time. She appears well-developed and well-nourished. No distress.   HENT:   Head: Normocephalic and atraumatic.   Mouth/Throat: Oropharynx is clear and moist.   Eyes: Conjunctivae are normal. Pupils are equal, round, and reactive to light.   Neck: Normal range of motion. Neck supple.   Cardiovascular: Normal rate and regular rhythm.  Occasional extrasystoles are present.   Respiratory: Effort normal and breath sounds normal. No stridor. No respiratory distress. She has no wheezes.   GI: Soft. Bowel sounds are normal. She exhibits no distension.  Musculoskeletal: She exhibits edema.  1+ edema right forearm. Diffuse ecchymotic areas bilateral forearms. Right foot drop.  Neurological: She is alert and oriented to person, place, and time.  Left facial weakness. Soft voice with slow speech. Able to follow simple one and two step  commands without difficulty.     Skin: Skin is warm and dry. She is not diaphoretic.  Psychiatric: She has a normal mood and affect. She is slowed.   Motor strength 4/5 Bilateral deltoid, bicep, tricep, grip  3- bilateral hip flexion 4 bilateral knee extension 3- right ankle dorsiflexion, 4 at the left ankle dorsiflexor  Sensation difficult to assess secondary to decreased concentration     Lab Results Last 24 Hours  Imaging Results (Last 48 hours)                                                             Assessment/Plan:  Diagnosis: Left hemiparesis resolved, has chronic gait disorder secondary to right foot drop, chronic radiculopathy  1.Does the need for close, 24 hr/day medical supervision in concert with the patient's rehab needs make it unreasonable for this patient to be served in a less intensive setting? Potentially   2.Co-Morbidities requiring supervision/potential complications: History of atrial fibrillation, history of PE and DVT, history of CAD   3.Due to bladder management, bowel management, safety, skin/wound care and patient education, does the patient require 24 hr/day rehab nursing? Potentially   4.Does the patient require coordinated care of a physician, rehab nurse, PT, OT, SLP to address physical and functional deficits in the context of the above medical diagnosis(es)? Potentially Addressing deficits in the following areas: balance, endurance, locomotion, strength, transferring and Required help with ADLs before so she is at baseline   5.Can the patient actively participate in an intensive therapy program of at least 3 hrs of therapy per day at least 5 days per week? No   6.The potential for patient to make measurable gains while on inpatient rehab is Limited secondary to prior mobility and ADL deficits   7.Anticipated functional outcomes upon discharge from inpatient rehab are n/a  with PT, n/a with OT, n/a with SLP.   8.Estimated rehab length of stay to reach the above functional goals is: Not applicable   9.Anticipated D/C setting: Home   10.Anticipated post D/C treatments: [facility] therapy   11.Overall Rehab/Functional Prognosis: fair      RECOMMENDATIONS:  This patient's condition is appropriate for continued rehabilitative care in the following setting: [facility]  Patient has agreed to participate in recommended program. N/A  Note that insurance prior authorization may be required for reimbursement for recommended care.     Comment: Await  neurology re-eval.  At this point patient looks like she is back near her baseline.     Charlett Blake M.D.  [facility]  FAAPM&R (Sports Med, Neuromuscular Med)  Diplomate Am Board of Electrodiagnostic Med     Flora Lipps  12/08/2017                Revision History                                        Routing History

## 2017-12-12 NOTE — H&P (Signed)
Physical Medicine and Rehabilitation Admission H&P    Chief Complaint  Patient presents with  . Functional deficits due to stroke.     HPI: Molly [name] is a 83 y.o. female with history of CAD, h/o PE/DVT (after back surgery), RLE weakness, vertigo,  PAF--no anticoagulation due epidural hematoma,  HTN who was admitted on 12/07/17 with left sided weakness, rightward gaze, difficulty speaking and inability to walk. History taken from chart review and daughter. CTA/P done revealing abnormal perfusion right frontal operculum with severe stenosis of right MCA mid M2 brach, no emergent large vessel infarct and aortic atherosclerosis. She was treated with tPA. 2 D echo with EF 55-60% with no wall abnormality and moderately calcified AV and calcified mitral annulus. She has had issues with hypotension past admission requiring fluid boluses. MRI brain 12/27 reviewed, negative for acute process. Neurology felt that episode due to aborted stroke with R M2 stenosis. Patient with R-MCA embolic stroke not seen on MRI.  TEE with loop recorder to be scheduled on outpatient basis. ASA changed to Plavix due to intracranial stenosis. She was placed on dysphagia 2, nectar liquids due to mild to moderate sensory oropharyngeal dysphagia but has had poor fluid intake with thick secretions. Was advanced to thin liquids-- with Provale cup and chin tuck to prevent aspiration. Therapy evaluations in progress  and CIR recommended due to functional deficits   Review of Systems  Constitutional: Positive for malaise/fatigue.  HENT: Negative for hearing loss and tinnitus.   Eyes: Negative for blurred vision and double vision.  Respiratory: Negative for cough and shortness of breath.   Cardiovascular: Negative for chest pain and palpitations.  Gastrointestinal: Positive for constipation (daughter reports good results yesterday). Negative for heartburn and nausea.  Genitourinary: Positive for frequency and urgency (wear  depends).  Musculoskeletal: Positive for myalgias.  Neurological: Positive for dizziness (with quick movements) and focal weakness (Right foot drop with shuffling gait).  Psychiatric/Behavioral: The patient has insomnia. The patient is not nervous/anxious.   All other systems reviewed and are negative.   Past Medical History:  Diagnosis Date  . Acid reflux   . Anemia   . Arthritis   . CAD (coronary artery disease)   . High cholesterol   . Hyperlipidemia   . Hypertension   . Osteoporosis   . Spinal stenosis     Past Surgical History:  Procedure Laterality Date  . ABDOMINAL HYSTERECTOMY    . BACK SURGERY     Rod and Pins Placed   . CARDIAC CATHETERIZATION N/A 11/17/2015   Procedure: Left Heart Cath and Coronary Angiography;  Surgeon: Jettie Booze, MD;  Location: [facility];  Service: Cardiovascular;  Laterality: N/A;  . CAROTID STENT      Family History  Problem Relation Age of Onset  . Anemia Mother   . Heart attack Brother     Social History:  Lives with daughter. Needed supervision with mobility and has hired help to help with ADLs for 4 hours during the week. She reports that  has never smoked. she has never used smokeless tobacco. She reports that she does not drink alcohol or use drugs.    Allergies  Allergen Reactions  . Buprenorphine Hcl Shortness Of Breath    Chest pain  . Codeine Nausea And Vomiting  . Hydrocodone Nausea And Vomiting  . Morphine And Related Shortness Of Breath    Chest pain  . Sulfa Antibiotics Nausea And Vomiting  . Sulfacetamide Sodium Nausea And Vomiting  .  Vicodin [Hydrocodone-Acetaminophen] Nausea And Vomiting  . Evista [Raloxifene] Other (See Comments) and Nausea And Vomiting    Upset stomach  . Hydromorphone Nausea And Vomiting    Sensitive  . Other Other (See Comments)    Narcotic-like medications - does NOT tolerate pain meds, causes unrelating volatile vomiting and drops in BP below 80/40 with dizziness and  fainting  Tourniquet - Please place tourniquet over cloth - causes sever bruising.  Blood pressure cuff - please use manual blood pressure cuff - causes excoriating pain using machine and bruising     . Sulfamethoxazole Nausea And Vomiting  . Sulfamethoxazole-Trimethoprim Nausea And Vomiting  . Sulfur Nausea And Vomiting    Vomited until bleeding occurred and developed an active stomach ulcer  . Tape Other (See Comments)    Rips skin  . Morphine Nausea Only    Made me crazy    Medications Prior to Admission  Medication Sig Dispense Refill  . acetaminophen (TYLENOL) 500 MG tablet Take 500 mg by mouth every 8 (eight) hours as needed for mild pain.     Marland Kitchen aspirin EC 81 MG tablet Take 81 mg by mouth every morning.    . denosumab (PROLIA) 60 MG/ML SOLN injection Inject 60 mg into the skin every 6 (six) months. Administer in upper arm, thigh, or abdomen    . gabapentin (NEURONTIN) 100 MG capsule Take 100 mg by mouth at bedtime as needed (nerve pain).     . Glucosamine-Chondroitin (OSTEO BI-FLEX REGULAR STRENGTH PO) Take 1 capsule by mouth 2 (two) times daily.    . isosorbide mononitrate (IMDUR) 30 MG 24 hr tablet Take 1 tablet (30 mg total) by mouth 2 (two) times daily. 180 tablet 3  . metoprolol succinate (TOPROL-XL) 25 MG 24 hr tablet Take 1 tablet (25 mg total) by mouth daily. (Patient taking differently: Take 25 mg by mouth every evening. ) 90 tablet 3  . Misc Natural Products (NARCOSOFT HERBAL LAX PO) Take 2 capsules by mouth 2 (two) times daily.     . nitroGLYCERIN (NITROSTAT) 0.4 MG SL tablet Place 0.4 mg under the tongue every 5 (five) minutes as needed for chest pain (x 3 pills).     . Omega-3 Fatty Acids (FISH OIL) 1000 MG CAPS Take 1,000 mg by mouth 2 (two) times daily.    Marland Kitchen omeprazole (PRILOSEC) 40 MG capsule Take 40 mg by mouth daily.    . polysaccharide iron (NIFEREX) 150 MG CAPS capsule Take 150 mg by mouth every morning.    . Probiotic Product (ALIGN) 4 MG CAPS Take 1 tablet by  mouth every morning.    . rosuvastatin (CRESTOR) 20 MG tablet Take 20 mg by mouth at bedtime.    . sennosides-docusate sodium (SENOKOT-S) 8.6-50 MG tablet Take 2 tablets by mouth 2 (two) times daily.     . TURMERIC PO Take 1 tablet by mouth daily.    . vitamin C (ASCORBIC ACID) 250 MG tablet Take 250 mg by mouth daily.      Drug Regimen Review  Drug regimen was reviewed and remains appropriate with no significant issues identified  Home: Home Living Family/patient expects to be discharged to:: Inpatient rehab Living Arrangements: Children(lives with a daughter) Available Help at Discharge: Family, Available 24 hours/day Type of Home: House Home Access: Ramped entrance Home Layout: One level Bathroom Shower/Tub: (grab bars at toilet and shower) Bathroom Toilet: Handicapped height Bathroom Accessibility: Yes Home Equipment: Walker - 4 wheels, Bedside commode, Shower seat, Hand held shower head  Additional Comments: Pt has also has hired A that comes in 4 days a week and helps with basic ADLs  Lives With: Daughter   Functional History: Prior Function Level of Independence: Needs assistance Gait / Transfers Assistance Needed: Uses a rollator with someone always close by. Pt and dtr both report that pt does normally move slow due to h/o vertigo (but now moving even slower) ADL's / Homemaking Assistance Needed: Does some of her UB ADLs and has A with LB ADLS Comments: has caregiver assist for LB bathing and dressing and to get in and out of shower  Functional Status:  Mobility: Bed Mobility Overal bed mobility: Needs Assistance Bed Mobility: Rolling, Supine to Sit Rolling: Mod assist Supine to sit: HOB elevated, Mod assist Sit to supine: Mod assist General bed mobility comments: modA for R LE movement off EoB, and scoot of hips to EoB vc for use fo R UE to pull self to EoB pt unable to reach R UE all the way to bed rail  Transfers Overall transfer level: Needs assistance Equipment  used: Rolling walker (2 wheeled) Transfers: Sit to/from Stand Sit to Stand: From elevated surface, Mod assist General transfer comment: modA for power up with R LE blocked, vc for anterior pelvic tilt, requires increased time to come to upright and rotate hips for steady standing (minA for ) Ambulation/Gait Ambulation/Gait assistance: Mod assist Ambulation Distance (Feet): 2 Feet Assistive device: Rolling walker (2 wheeled) Gait Pattern/deviations: Step-to pattern General Gait Details: modA for support to advance L LE, pt unable to clear R foot from floor only able to offweight enough to slide along floor, Pt requires increased time and vc for stepping Gait velocity: slowed Gait velocity interpretation: Below normal speed for age/gender    ADL: ADL Overall ADL's : Needs assistance/impaired Eating/Feeding: Supervision/ safety, Set up, Bed level Grooming: Bed level, Minimal assistance Upper Body Bathing: Minimal assistance, Bed level Lower Body Bathing: Total assistance Lower Body Bathing Details (indicate cue type and reason): Mod A sit<>stand Upper Body Dressing : Maximal assistance Upper Body Dressing Details (indicate cue type and reason): sitting EOB Lower Body Dressing: Total assistance Lower Body Dressing Details (indicate cue type and reason): Mod A sit<>stand Toilet Transfer Details (indicate cue type and reason): Mod A sit<>stand; min A stand and side step Toileting- Clothing Manipulation and Hygiene: Total assistance Toileting - Clothing Manipulation Details (indicate cue type and reason): Mod A sit<>stand  Cognition: Cognition Overall Cognitive Status: Difficult to assess Arousal/Alertness: Awake/alert Orientation Level: Oriented X4 Comments: Overall decreased processing; unsure if this is baseline vs extension of previous mental status Cognition Arousal/Alertness: Awake/alert Behavior During Therapy: WFL for tasks assessed/performed Overall Cognitive Status:  Difficult to assess General Comments: Not specifically tested, for tasks assessed she was able to follow basic commands and communicate her needs   Blood pressure (!) 171/63, pulse 64, temperature 98.1 F (36.7 C), temperature source Oral, resp. rate 20, height 5' 2"  (1.575 m), weight 76 kg (167 lb 8.8 oz), SpO2 100 %. Physical Exam  Nursing note and vitals reviewed. Constitutional: She is oriented to person, place, and time. She appears well-developed and well-nourished. No distress.  Looks exhausted again today (poor sleep per daughter).    HENT:  Head: Normocephalic and atraumatic.  Eyes: Conjunctivae and EOM are normal. Pupils are equal, round, and reactive to light.  Neck: Normal range of motion. Neck supple.  Cardiovascular: Normal rate and regular rhythm.  Respiratory: Effort normal and breath sounds normal. No stridor. No respiratory  distress. She has no wheezes.  GI: Soft. Bowel sounds are normal. There is no tenderness.  Musculoskeletal: She exhibits edema (1+ BUE with diffuse ecchymosis resolving). She exhibits no tenderness.  BLE sensitive to pressure  Neurological: She is alert and oriented to person, place, and time.  Soft dysarthric speech with slurring Right facial weakness. Motor: Grossly 4/5 throughout, except for right ADF: 2+/5 Able to follow simple motor commands.   Skin: Skin is warm and dry. She is not diaphoretic.  Psychiatric: Her affect is blunt. Her speech is delayed and slurred. She is slowed. Cognition and memory are normal.    Results for orders placed or performed during the hospital encounter of 12/07/17 (from the past 48 hour(s))  CBC     Status: Abnormal   Collection Time: 12/11/17  9:29 AM  Result Value Ref Range   WBC 4.9 4.0 - 10.5 K/uL   RBC 3.33 (L) 3.87 - 5.11 MIL/uL   Hemoglobin 10.5 (L) 12.0 - 15.0 g/dL   HCT 32.3 (L) 36.0 - 46.0 %   MCV 97.0 78.0 - 100.0 fL   MCH 31.5 26.0 - 34.0 pg   MCHC 32.5 30.0 - 36.0 g/dL   RDW 13.4 11.5 - 15.5 %    Platelets 150 150 - 400 K/uL  Basic metabolic panel     Status: Abnormal   Collection Time: 12/11/17  9:29 AM  Result Value Ref Range   Sodium 141 135 - 145 mmol/L   Potassium 3.3 (L) 3.5 - 5.1 mmol/L   Chloride 115 (H) 101 - 111 mmol/L   CO2 23 22 - 32 mmol/L   Glucose, Bld 121 (H) 65 - 99 mg/dL   BUN 14 6 - 20 mg/dL   Creatinine, Ser 0.69 0.44 - 1.00 mg/dL   Calcium 8.5 (L) 8.9 - 10.3 mg/dL   GFR calc non Af Amer >60 >60 mL/min   GFR calc Af Amer >60 >60 mL/min    Comment: (NOTE) The eGFR has been calculated using the CKD EPI equation. This calculation has not been validated in all clinical situations. eGFR's persistently <60 mL/min signify possible Chronic Kidney Disease.    Anion gap 3 (L) 5 - 15  CBC     Status: Abnormal   Collection Time: 12/12/17  3:54 AM  Result Value Ref Range   WBC 5.5 4.0 - 10.5 K/uL   RBC 3.32 (L) 3.87 - 5.11 MIL/uL   Hemoglobin 10.5 (L) 12.0 - 15.0 g/dL   HCT 31.5 (L) 36.0 - 46.0 %   MCV 94.9 78.0 - 100.0 fL   MCH 31.6 26.0 - 34.0 pg   MCHC 33.3 30.0 - 36.0 g/dL   RDW 13.4 11.5 - 15.5 %   Platelets 146 (L) 150 - 400 K/uL  Basic metabolic panel     Status: Abnormal   Collection Time: 12/12/17  3:54 AM  Result Value Ref Range   Sodium 140 135 - 145 mmol/L   Potassium 4.3 3.5 - 5.1 mmol/L    Comment: NO VISIBLE HEMOLYSIS   Chloride 113 (H) 101 - 111 mmol/L   CO2 22 22 - 32 mmol/L   Glucose, Bld 103 (H) 65 - 99 mg/dL   BUN 10 6 - 20 mg/dL   Creatinine, Ser 0.61 0.44 - 1.00 mg/dL   Calcium 8.5 (L) 8.9 - 10.3 mg/dL   GFR calc non Af Amer >60 >60 mL/min   GFR calc Af Amer >60 >60 mL/min    Comment: (NOTE) The eGFR has  been calculated using the CKD EPI equation. This calculation has not been validated in all clinical situations. eGFR's persistently <60 mL/min signify possible Chronic Kidney Disease.    Anion gap 5 5 - 15   No results found.    Medical Problem List and Plan: 1.  Weakness, dysphagia, limitations with mobility and ADLs  secondary to suspected R-MCA embolic stroke. 2.  DVT Prophylaxis/Anticoagulation: Pharmaceutical: Lovenox 3. Peripheral neuropathy/Pain Management: Resume bedtim tylenol. Will schedule home dose Neurontin to help with insomnia as well as BLE neuropathy (used prn at home).  4. Mood: LCSW to follow for evaluation and support.  5. Neuropsych: This patient is not fully capable of making decisions on her own behalf. 6. Skin/Wound Care: routine pressure relief measures.  7. Fluids/Electrolytes/Nutrition: Monitor I/O. Encourage fluid intake--renal status WNL. Discontinue IVF to avoid overload.  8. HTN: Avoid hypotension to allow for adequate perfusion  9. CAD: On metoprolol, Crestor and Imdur.  10 PAF: To follow up with cardiology for TEE/loop recorder after discharge.  11. GERD: Resume PPI.  12. Spinal stenosis with right foot drop: Neurontin at bedtime prn neuropathy. She does not like to use her AFO.  13. ABLA: Repeat CBC in am.  14. Constipation: Better per family--continue Senna 2  bid.  15. Hypokalemia: Resolved with supplement. Likely due to IVF--d/c fluids as now on thins. Recheck in am.     Post Admission Physician Evaluation: 1. Preadmission assessment reviewed and changes made below. 2. Functional deficits secondary  to suspected R-MCA embolic stroke. 3. Patient is admitted to receive collaborative, interdisciplinary care between the physiatrist, rehab nursing staff, and therapy team. 4. Patient's level of medical complexity and substantial therapy needs in context of that medical necessity cannot be provided at a lesser intensity of care such as a SNF. 5. Patient has experienced substantial functional loss from his/her baseline which was documented above under the "Functional History" and "Functional Status" headings.  Judging by the patient's diagnosis, physical exam, and functional history, the patient has potential for functional progress which will result in measurable gains while on  inpatient rehab.  These gains will be of substantial and practical use upon discharge  in facilitating mobility and self-care at the household level. 86. Physiatrist will provide 24 hour management of medical needs as well as oversight of the therapy plan/treatment and provide guidance as appropriate regarding the interaction of the two. 7. 24 hour rehab nursing will assist with bladder management, safety, disease management and patient education  and help integrate therapy concepts, techniques,education, etc. 8. PT will assess and treat for/with: Lower extremity strength, range of motion, stamina, balance, functional mobility, safety, adaptive techniques and equipment, coping skills, pain control, education.   Goals are: Supervision/Min A. 9. OT will assess and treat for/with: ADL's, functional mobility, safety, upper extremity strength, adaptive techniques and equipment, ego support, and community reintegration.   Goals are: Supervision/Min A. Therapy may proceed with showering this patient. 10. SLP will assess and treat for/with: speech, swallowing, cognition.  Goals are: Min A/Supervision. 11. Case Management and Social Worker will assess and treat for psychological issues and discharge planning. 12. Team conference will be held weekly to assess progress toward goals and to determine barriers to discharge. 13. Patient will receive at least 3 hours of therapy per day at least 5 days per week. 14. ELOS: 12-16 days.       15. Prognosis:  good  Delice Lesch, MD, A[facility], [place] 12/12/2017

## 2017-12-12 NOTE — Care Management Important Message (Signed)
Important Message  Patient Details  Name: Molly [name] MRN: [id number] Date of Birth: Sep 30, 1929   Medicare Important Message Given:  Yes    Tarin Navarez 12/12/2017, 1:51 PM

## 2017-12-12 NOTE — Progress Notes (Signed)
Cristina Gong, RN  Rehab Admission Coordinator  Physical Medicine and Rehabilitation  PMR Pre-admission  Signed  Date of Service:  12/09/2017 3:06 PM       Related encounter: ED to Hosp-Admission (Discharged) from 12/07/2017 in [facility]      Signed           [] Hide copied text  [] Hover for details   PMR Admission Coordinator Pre-Admission Assessment  Patient: Molly [name] is an 83 y.o., female MRN: [id number] DOB: 1929-08-26 Height: 5\' 2"  (157.5 cm) Weight: 76 kg (167 lb 8.8 oz)                                                                                                                                                  Insurance Information HMO: yes    PPO:      PCP:      IPA:      80/20:      OTHER: medicare advantage plan PRIMARY: [employer] Medicare   Policy#: [id number]    Subscriber: pt CM Name: Orvan July      Phone#: [phone]     Fax#: [phone] Pre-Cert#: [id number] approved for 7 days with f/u with Vevelyn Royals phone [phone] fax [phone]      Employer: retired Benefits:  Phone #: [phone]     Name: 12/09/2017 Eff. Date: 02/10/2017     Deduct: none      Out of Pocket Max: $4400      Life Max: none CIR: $345 co pay per day days 1 to 5      SNF:  No co pay days 1-20; $160 co pay per day days 21-48; no co pay days 49- 100 Outpatient: $40 co pay per visit     Co-Pay:  Visits per medical neccesity Home Health: 100%      Co-Pay: visits per medical neccesity DME: 80%     Co-Pay: 20% Providers: in network  SECONDARY: none       Medicaid Application Date:       Case Manager:  Disability Application Date:       Case Worker:   Emergency Tax adviser Information    Name Relation Home Work Mobile   [place] Daughter [phone]     [name] Daughter [phone]       Current Medical History  Patient Admitting Diagnosis: CVA  History of Present Illness:   GBT:DVVOHYWVP Foglemanis a 83 y.o.femalewith history of CAD, h/o PE/DVT (after back surgery), RLE weakness, vertigo, Afib in the past--no anticoagulation due to resolution, HTN who was admitted on 12/07/17 with left sided weakness, rightward gaze, difficulty speaking and inability to walk. CTA/P done revealing abnormal perfusion right frontal operculum with severe stenosis of right MCA mid M2 brach, no emergent large vessel infarct and aortic  atherosclerosis. She was treated with tPA and MRI brain pending. 2 D echo with EF 55-60% with no wall abnormality and moderately calcified AV and calcified mitral annulus. She has had issues with hypotensionpast admissionrequiring fluid boluses.   MRI brain 12/27 was negative for acute ischemia. Neurology felt that patient with R-MCA embolic stroke not seen on MRI and outpatient TEE with loop recorder recommended. ASA changed to Plavix due to intracranial stenosis. She was placed on dysphagia 2, nectar liquids due to mild to moderate sensory oropharyngeal dysphagia.   Total: 1 NIHSS  Past Medical History      Past Medical History:  Diagnosis Date  . Acid reflux   . Anemia   . Arthritis   . CAD (coronary artery disease)   . High cholesterol   . Hyperlipidemia   . Hypertension   . Osteoporosis   . Spinal stenosis     Family History  family history includes Anemia in her mother; Heart attack in her brother.  Prior Rehab/Hospitalizations:  Has the patient had major surgery during 100 days prior to admission? No   Previously has AIR at [facility] after back surgery 2 years ago and went home with daughter after discharged  Current Medications   Current Facility-Administered Medications:  .  0.9 %  sodium chloride infusion, , Intravenous, Continuous, Rosalin Hawking, MD, Last Rate: 50 mL/hr at 12/11/17 1246 .  acetaminophen (TYLENOL) tablet 650 mg, 650 mg, Oral, Q4H PRN, 650 mg at 12/11/17 2248 **OR** acetaminophen (TYLENOL)  solution 650 mg, 650 mg, Per Tube, Q4H PRN **OR** acetaminophen (TYLENOL) suppository 650 mg, 650 mg, Rectal, Q4H PRN, Amie Portland, MD .  calcium carbonate (TUMS - dosed in mg elemental calcium) chewable tablet 400 mg of elemental calcium, 2 tablet, Oral, Once, Greta Doom, MD .  clopidogrel (PLAVIX) tablet 75 mg, 75 mg, Oral, Daily, Costello, Mary A, NP, 75 mg at 12/12/17 0853 .  isosorbide mononitrate (IMDUR) 24 hr tablet 30 mg, 30 mg, Oral, BID, Rosalin Hawking, MD, 30 mg at 12/12/17 0854 .  metoprolol succinate (TOPROL-XL) 24 hr tablet 25 mg, 25 mg, Oral, Daily, Costello, Mary A, NP, 25 mg at 12/12/17 0854 .  pantoprazole (PROTONIX) EC tablet 40 mg, 40 mg, Oral, Daily, Rosalin Hawking, MD, 40 mg at 12/12/17 0854 .  RESOURCE THICKENUP CLEAR, , Oral, PRN, Garvin Fila, MD .  rosuvastatin (CRESTOR) tablet 20 mg, 20 mg, Oral, QHS, Costello, Mary A, NP, 20 mg at 12/11/17 2214 .  senna-docusate (Senokot-S) tablet 2 tablet, 2 tablet, Oral, BID, Rosalin Hawking, MD, 2 tablet at 12/12/17 7824  Patients Current Diet: DIET DYS 2 Room service appropriate? Yes; Fluid consistency: Nectar Thick  Precautions / Restrictions Precautions Precautions: Fall Other Brace/Splint: pt has an AFO at home for her right leg, but it is too heavy for her to use.  Restrictions Weight Bearing Restrictions: No   Has the patient had 2 or more falls or a fall with injury in the past year?No  Prior Activity Level Limited Community (1-2x/wk): active in the home pta  [place] Devices/Equipment: Cane (specify quad or straight), Walker (specify type) Home Equipment: Walker - 4 wheels, Bedside commode, Shower seat, Hand held shower head  Prior Device Use: Indicate devices/aids used by the patient prior to current illness, exacerbation or injury? Walker  Prior Functional Level Prior Function Level of Independence: Needs assistance Gait / Transfers Assistance Needed: Uses  a rollator with someone always close by. Pt and dtr  both report that pt does normally move slow due to h/o vertigo (but now moving even slower) ADL's / Homemaking Assistance Needed: Does some of her UB ADLs and has A with LB ADLS Comments: has caregiver assist for LB bathing and dressing and to get in and out of shower  Self Care: Did the patient need help bathing, dressing, using the toilet or eating?  Needed some help  Indoor Mobility: Did the patient need assistance with walking from room to room (with or without device)? Needed some help  Stairs: Did the patient need assistance with internal or external stairs (with or without device)? Needed some help  Functional Cognition: Did the patient need help planning regular tasks such as shopping or remembering to take medications? Needed some help  Current Functional Level Cognition  Arousal/Alertness: Awake/alert Overall Cognitive Status: Difficult to assess Orientation Level: Oriented X4 General Comments: Not specifically tested, for tasks assessed she was able to follow basic commands and communicate her needs Comments: Overall decreased processing; unsure if this is baseline vs extension of previous mental status    Extremity Assessment (includes Sensation/Coordination)  Upper Extremity Assessment: Defer to OT evaluation RUE Deficits / Details: generalized weakness LUE Deficits / Details: Can move arm for movements asked but quite slow compared to RUE  Lower Extremity Assessment: RLE deficits/detail, LLE deficits/detail RLE Deficits / Details: right leg with baseline weakness from back surgery.  R foot drop DF 0/5, knee extension 3-/5, hip flexion 3-/5 LLE Deficits / Details: left leg with generalized weakness ankle DF 3/5, knee extension 3/5, hip flexion 3/5 per seated MMT    ADLs  Overall ADL's : Needs assistance/impaired Eating/Feeding: Supervision/ safety, Set up, Bed level Grooming: Bed level, Minimal  assistance Upper Body Bathing: Minimal assistance, Bed level Lower Body Bathing: Total assistance Lower Body Bathing Details (indicate cue type and reason): Mod A sit<>stand Upper Body Dressing : Maximal assistance Upper Body Dressing Details (indicate cue type and reason): sitting EOB Lower Body Dressing: Total assistance Lower Body Dressing Details (indicate cue type and reason): Mod A sit<>stand Toilet Transfer Details (indicate cue type and reason): Mod A sit<>stand; min A stand and side step Toileting- Clothing Manipulation and Hygiene: Total assistance Toileting - Clothing Manipulation Details (indicate cue type and reason): Mod A sit<>stand    Mobility  Overal bed mobility: Needs Assistance Bed Mobility: Rolling, Supine to Sit Rolling: Mod assist Supine to sit: HOB elevated, Mod assist Sit to supine: Mod assist General bed mobility comments: modA for R LE movement off EoB, and scoot of hips to EoB vc for use fo R UE to pull self to EoB pt unable to reach R UE all the way to bed rail     Transfers  Overall transfer level: Needs assistance Equipment used: Rolling walker (2 wheeled) Transfers: Sit to/from Stand Sit to Stand: From elevated surface, Mod assist General transfer comment: modA for power up with R LE blocked, vc for anterior pelvic tilt, requires increased time to come to upright and rotate hips for steady standing (minA for )    Ambulation / Gait / Stairs / Wheelchair Mobility  Ambulation/Gait Ambulation/Gait assistance: Mod assist Ambulation Distance (Feet): 2 Feet Assistive device: Rolling walker (2 wheeled) Gait Pattern/deviations: Step-to pattern General Gait Details: modA for support to advance L LE, pt unable to clear R foot from floor only able to offweight enough to slide along floor, Pt requires increased time and vc for stepping Gait velocity: slowed Gait velocity interpretation: Below normal speed  for age/gender    Posture / Balance Dynamic  Sitting Balance Sitting balance - Comments: close supervision EOB Balance Overall balance assessment: Needs assistance Sitting-balance support: Bilateral upper extremity supported, Feet supported Sitting balance-Leahy Scale: Fair Sitting balance - Comments: close supervision EOB Standing balance support: Bilateral upper extremity supported Standing balance-Leahy Scale: Poor Standing balance comment: needs assist and support of RW    Special needs/care consideration BiPAP/CPAP  N/a CPM  N/a Continuous Drip IV  N/a Dialysis  N/a Life Vest  N/a Oxygen  N/a Special Bed  N/a Trach Size  N/a Wound Vac n/a Skin ecchymosis BUE Bowel mgmt: incontinent LBM 12/31 Bladder mgmt: external catheter Diabetic mgmt n/a   Previous Home Environment Living Arrangements: Children(lives with a daughter)  Lives With: Daughter Available Help at Discharge: Family, Available 24 hours/day Type of Home: House Home Layout: One level Home Access: Ramped entrance Bathroom Shower/Tub: (grab bars at toilet and shower) Bathroom Toilet: Handicapped height Bathroom Accessibility: Yes How Accessible: Accessible via walker [place]: (private caregiver as needed) Additional Comments: Pt has also has hired A that comes in 4 days a week and helps with basic ADLs  Discharge Living Setting Plans for Discharge Living Setting: Lives with (comment)(daughter) Type of Home at Discharge: House Discharge Home Layout: One level Discharge Home Access: [place] entrance Discharge Bathroom Shower/Tub: Walk-in shower Discharge Bathroom Toilet: Handicapped height Discharge Bathroom Accessibility: Yes How Accessible: Accessible via walker Does the patient have any problems obtaining your medications?: No  Social/Family/Support Systems Patient Roles: Parent Contact Information: [name], daughter Anticipated Caregiver: laura and hired caregiver Anticipated Ambulance person Information: see  above Ability/Limitations of Caregiver: daughter works from home Caregiver Availability: 24/7 Discharge Plan Discussed with Primary Caregiver: Yes Is Caregiver In Agreement with Plan?: Yes Does Caregiver/Family have Issues with Lodging/Transportation while Pt is in Rehab?: No  Goals/Additional Needs Patient/Family Goal for Rehab: supervision with PT, supervision to min assist OT, supervision with SLP Expected length of stay: ELOS 10- 14 days Pt/Family Agrees to Admission and willing to participate: Yes Program Orientation Provided & Reviewed with Pt/Caregiver Including Roles  & Responsibilities: Yes  Decrease burden of Care through IP rehab admission: n/a  Possible need for SNF placement upon discharge:not anticipated  Patient Condition: This patient's medical and functional status has changed since the consult dated 12/08/2017 in which the Rehabilitation Physician documented that the patient was not appropriate for intensive rehabilitative care in an inpatient rehabilitation facility. Due to change in status and issues being addressed, patient's case has been discussed with Dr. Posey Pronto and patient now appropriate for inpatient rehabilitation. Will admit to inpatient rehab today.  Preadmission Screen Completed By:  Cleatrice Burke, 12/12/2017 8:56 AM ______________________________________________________________________   Discussed status with Dr. Posey Pronto on 12/12/2017 at [phone] and received telephone approval for admission today.  Admission Coordinator:  Cleatrice Burke, time 1497 Date 12/12/2017             Cosigned by: Jamse Arn, MD at 12/12/2017 10:09 AM  Revision History

## 2017-12-12 NOTE — Discharge Summary (Signed)
Stroke Discharge Summary  Patient ID: Molly [name]   MRN: [id number]      DOB: 1929-11-11  Date of Admission: 12/07/2017 Date of Discharge: 12/12/2017  Attending Physician:  Rosalin Hawking, MD, Stroke MD Consultant(s):  Treatment Team:  Stroke, Md, MD rehabilitation medicine Patient's PCP:  [name], MD  Discharge Diagnoses:  Aborted stroke by tPA   Active Problems: Severe stenosis of right MCA mid M2 branch Dysphasia Anemia Hypertension Hyperlipidemia  Past Medical History:  Diagnosis Date  . Acid reflux   . Anemia   . Arthritis   . CAD (coronary artery disease)   . High cholesterol   . Hyperlipidemia   . Hypertension   . Osteoporosis   . Spinal stenosis    Past Surgical History:  Procedure Laterality Date  . ABDOMINAL HYSTERECTOMY    . BACK SURGERY     Rod and Pins Placed   . CARDIAC CATHETERIZATION N/A 11/17/2015   Procedure: Left Heart Cath and Coronary Angiography;  Surgeon: Jettie Booze, MD;  Location: [facility];  Service: Cardiovascular;  Laterality: N/A;  . CAROTID STENT     Medications to be continued on Rehab . calcium carbonate  2 tablet Oral Once  . clopidogrel  75 mg Oral Daily  . isosorbide mononitrate  30 mg Oral BID  . metoprolol succinate  25 mg Oral Daily  . pantoprazole  40 mg Oral Daily  . rosuvastatin  20 mg Oral QHS  . senna-docusate  2 tablet Oral BID   LABORATORY STUDIES CBC    Component Value Date/Time   WBC 5.5 12/12/2017 0354   RBC 3.32 (L) 12/12/2017 0354   HGB 10.5 (L) 12/12/2017 0354   HCT 31.5 (L) 12/12/2017 0354   PLT 146 (L) 12/12/2017 0354   MCV 94.9 12/12/2017 0354   MCH 31.6 12/12/2017 0354   MCHC 33.3 12/12/2017 0354   RDW 13.4 12/12/2017 0354   LYMPHSABS 2.9 12/07/2017 1012   MONOABS 0.4 12/07/2017 1012   EOSABS 0.1 12/07/2017 1012   BASOSABS 0.0 12/07/2017 1012   CMP    Component Value Date/Time   NA 140 12/12/2017 0354   K 4.3 12/12/2017 0354   CL 113 (H) 12/12/2017 0354    CO2 22 12/12/2017 0354   GLUCOSE 103 (H) 12/12/2017 0354   BUN 10 12/12/2017 0354   CREATININE 0.61 12/12/2017 0354   CREATININE 0.82 11/14/2015 1507   CALCIUM 8.5 (L) 12/12/2017 0354   PROT 7.0 12/07/2017 1012   ALBUMIN 3.9 12/07/2017 1012   AST 36 12/07/2017 1012   ALT 24 12/07/2017 1012   ALKPHOS 34 (L) 12/07/2017 1012   BILITOT 0.7 12/07/2017 1012   GFRNONAA >60 12/12/2017 0354   GFRAA >60 12/12/2017 0354   COAGS Lab Results  Component Value Date   INR 1.01 12/07/2017   INR 0.98 11/14/2015   INR 0.98 03/03/2012   Urinalysis    Component Value Date/Time   COLORURINE YELLOW 12/07/2017 1150   APPEARANCEUR CLEAR 12/07/2017 1150   LABSPEC 1.025 12/07/2017 1150   PHURINE 6.0 12/07/2017 1150   GLUCOSEU NEGATIVE 12/07/2017 1150   HGBUR NEGATIVE 12/07/2017 1150   BILIRUBINUR NEGATIVE 12/07/2017 1150   KETONESUR NEGATIVE 12/07/2017 1150   PROTEINUR NEGATIVE 12/07/2017 1150   UROBILINOGEN 0.2 01/03/2014 1515   NITRITE NEGATIVE 12/07/2017 1150   LEUKOCYTESUR NEGATIVE 12/07/2017 1150   Urine Drug Screen     Component Value Date/Time   LABOPIA NONE DETECTED 12/07/2017 1150   COCAINSCRNUR NONE  DETECTED 12/07/2017 1150   LABBENZ NONE DETECTED 12/07/2017 1150   AMPHETMU NONE DETECTED 12/07/2017 1150   THCU NONE DETECTED 12/07/2017 1150   LABBARB NONE DETECTED 12/07/2017 1150    Alcohol Level    Component Value Date/Time   ETH <10 12/07/2017 1012   SIGNIFICANT DIAGNOSTIC STUDIES Ct Angio Head, Perfusion and Neck W Or Wo Contrast Result Date: 12/07/2017 IMPRESSION:  1. No emergent large vessel occlusion.  2. Small region of abnormal perfusion in the right frontal operculum without evidence of core infarct utilizing CBF <30% threshold.  3. Severe stenosis of a right MCA mid M2 branch vessel corresponding to the perfusion abnormality.  4. Mild cervical carotid artery atherosclerosis without stenosis.  5.  Aortic Atherosclerosis (ICD10-I70.0).   Mr Brain Wo  Contrast Result Date: 12/08/2017 IMPRESSION:  Negative noncontrast MRI of the head for age.   Ct Head Code Stroke Wo Contrast Result Date: 12/07/2017 IMPRESSION:  1. Noncontrast CT appearance of the brain is stable since 2015 and normal for age.  2. ASPECTS is 10.    Echocardiogram:  Study Conclusions - Left ventricle: The cavity size was normal. Wall thickness was normal. Systolic function was normal. The estimated ejection fraction was in the range of 55% to 60%. Wall motion was normal; there were no regional wall motion abnormalities. Doppler parameters are consistent with abnormal left ventricular relaxation (grade 1 diastolic dysfunction). - Aortic valve: There was mild regurgitation. - Mitral valve: Calcified annulus. Impressions: - Normal LV systolic function; mild diastolic dysfunction; mild AI; trace MR and TR.   HISTORY OF PRESENT ILLNESS   &   HOSPITAL COURSE ASSESSMENT: Molly [name] is a 83 y.o. female with PMH of coronary artery disease, hyperlipidemia, hypertension, atrial fibrillation on no anticoagulation presenting to the emergency room with a last known normal of 9:15 AM on 12/07/2017 with left-sided weakness and inability to stand. NIHSS 10. CT-scan of the brain -no acute change. Aspects 10.  CT Angio of head and neck no large vessel occlusion. CT perfusion study of the brain shows a small area of penumbra in the right MCA territory with no core. IV TPA given Wed 12/26. Episode is likely aborted stroke with right M2 stenosis.    STROKE: Possibly stroke not seen on MRI  Suspected Etiology of symptoms: athero vs cardioembolic source ,  Resultant Symptoms: dysarthria, left facial droop and left-sided neglect. Stroke Risk Factors: atrial fibrillation, hyperlipidemia and hypertension Other Stroke Risk Factors: Advanced age, Coronary artery disease  Outstanding Stroke Work-up Studies:      TEE /Loop Recorder: to be scheduled as an  outpatient  PLAN  12/11/2017: Continue Statin Added Plavix  Frequent neuro checks Telemetry monitoring PT/OT/SLP - CIR recommended Outpatient TEE and Loop Recorder Placement Ongoing aggressive stroke risk factor management Patient counseled to be compliant withherantithrombotic medications Patient counseled on Lifestyle modifications including, Diet, Exercise, and Stress Follow up with Dr. Leonie [facility] at [facility] in 6 weeks  INTRACRANIAL Atherosclerosis &Stenosis: Severe stenosis of a right MCA mid M2 branch vessel   DYSPHAGIA: PassedSLP swallow evaluation- Dysphagia 2 Aspiration Precautions in progress Speech following  R/O AFIB: Outpatient TEE and Loop Recorder Placement - follow up with Dr. Leonie Man as outpt to arrange. Patient was on Coumadin a few years ago after developing DVT.  Developed epidural hematoma and Coumadin discontinued.  IVC filter was placed.  MEDICAL ISSUES: Anemia - Stable Hemoglobin 11.3 -> 10.5  Hypokalemia-stable Replacement given, potassium 4.3 today  HYPERTENSION: Stable Long term BP goal  normotensive.  HYPERLIPIDEMIA: Labs(Brief)          Component Value Date/Time   CHOL 133 12/08/2017 0309   TRIG 98 12/08/2017 0309   HDL 63 12/08/2017 0309   CHOLHDL 2.1 12/08/2017 0309   VLDL 20 12/08/2017 0309   LDLCALC 50 12/08/2017 0309    Home Meds: Crestor 20 mg LDL  goal < 70 Continued on Crestor 20 mg daily Continue statin at discharge  R/O DIABETES: RecentLabs       Lab Results  Component Value Date   HGBA1C 5.7 (H) 12/08/2017    HgbA1c goal < 7.0  DISCHARGE EXAM Blood pressure (!) 149/65, pulse (!) 58, temperature 98.4 F (36.9 C), temperature source Axillary, resp. rate 18, height 5\' 2"  (1.575 m), weight 76 kg (167 lb 8.8 oz), SpO2 100 %. General - Well nourished, well developed, in no apparent distress Respiratory - Lungs clear bilaterally. No wheezing. Cardiovascular - Regular rate and rhythm   Neurological exam Awake, alert, follows all commands, answers all questions appropriately.  Cranial nerves: Pupils equal round reactive to light, visual field full, mild dysarthria, palate elevates symmetrically, shoulder shrug intact, tongue midline. Motor exam: She is able to lift all 4 extremities antigravity with a minimal vertical drift in the right LE and foot drop. Sensory exam: symmetrical as per pt Coordination: Slow to perform but intact finger nose finger bilaterally Gait was not tested for patient safety.  Discharge Diet  DIET DYS 2 Room service appropriate? Yes; Fluid consistency: Nectar Thick liquids  DISCHARGE PLAN Prior Home Stroke Medications:  aspirin 81 mg daily  Discharge Stroke Meds:  Please discharge patient on Plavix 75 mg daily and Crestor 20 mg daily  Disposition:  Transfer to [facility] for ongoing PT, OT and ST Therapy Recs:              CIR  Home Equipment:         TBD Follow Up:     Follow-up Information    Garvin Fila, MD. Schedule an appointment as soon as possible for a visit in 6 week(s).   Specialties:  Neurology, Radiology Contact information: [address] [place] [address] [phone]        [name], MD. Schedule an appointment as soon as possible for a visit in 1 week(s).   Specialty:  Family Medicine Contact information: [address] [place] [address] [phone]        [place]. Call.   Why:  NEEDS TEE AND LOOP RECORDER PLACEMENT PRIOR TO DISCHARGE FROM CIR Contact information: [place] [phone]         Greater than 30 minutes were spent preparing discharge.  Renie Ora Stroke Neurology Team 12/12/2017 09:06 AM  Attending note: I reviewed above note and agree with the assessment and plan. I have made any additions  or clarifications directly to the above note. Pt was seen and examined.  83 year old female with history of CAD, HTN, HLD, spinal stenosis status post surgery complicated by DVT and epidural hematoma on coumadin and then s/p IVC filter admitted for left sided weakness and right-sided gaze.  TPA given.  CTA head and neck showed right MCA mid M2 stenosis.  MRI negative, EF 55-60%.  LDL 50, A1c 5.7.   Episode is likely aborted stroke with right M2 stenosis.  Antiplatelet change from aspirin 81 to Plavix, continue Crestor.  Agree with outpatient TEE and loop recorder when follow with Dr. Leonie Man in clinic. PT/OT recommend CIR. d/c to CIR today.  Rosalin Hawking, MD PhD Stroke Neurology 12/12/2017 3:20 PM

## 2017-12-12 NOTE — Care Management Note (Signed)
Case Management Note  Patient Details  Name: Allee Busk MRN: [id number] Date of Birth: 04-Jan-1929  Subjective/Objective:                    Action/Plan: Pt discharging to CIR today. No further needs per CM.   Expected Discharge Date:  12/12/17               Expected Discharge Plan:  [place]  In-House Referral:     Discharge planning Services  CM Consult  Post Acute Care Choice:    Choice offered to:     DME Arranged:    DME Agency:     HH Arranged:    HH Agency:     Status of Service:  Completed, signed off  If discussed at [employer], dates discussed:    Additional Comments:  Pollie Friar, RN 12/12/2017, 11:35 AM

## 2017-12-12 NOTE — Progress Notes (Signed)
Patient ID: Molly [name], female   DOB: [birth year], 83 y.o.   MRN: [id number] Patient arrived from 3W17 with RN, family, and patient belongings. Patient and family oriented to room, nurse call system, rehab process, schedule, fall prevention plan, safety plan, and health resource notebook. Patient resting comfortably in bed with no complaints of pain, call bell, and family at the bedside.

## 2017-12-13 ENCOUNTER — Inpatient Hospital Stay (HOSPITAL_COMMUNITY): Payer: Medicare Other | Admitting: Occupational Therapy

## 2017-12-13 ENCOUNTER — Inpatient Hospital Stay (HOSPITAL_COMMUNITY): Payer: Medicare Other | Admitting: Physical Therapy

## 2017-12-13 ENCOUNTER — Inpatient Hospital Stay (HOSPITAL_COMMUNITY): Payer: Medicare Other

## 2017-12-13 DIAGNOSIS — G8194 Hemiplegia, unspecified affecting left nondominant side: Secondary | ICD-10-CM

## 2017-12-13 DIAGNOSIS — I63511 Cerebral infarction due to unspecified occlusion or stenosis of right middle cerebral artery: Secondary | ICD-10-CM

## 2017-12-13 LAB — COMPREHENSIVE METABOLIC PANEL
ALBUMIN: 3 g/dL — AB (ref 3.5–5.0)
ALK PHOS: 30 U/L — AB (ref 38–126)
ALT: 22 U/L (ref 14–54)
AST: 29 U/L (ref 15–41)
Anion gap: 9 (ref 5–15)
BUN: 13 mg/dL (ref 6–20)
CALCIUM: 9.6 mg/dL (ref 8.9–10.3)
CHLORIDE: 105 mmol/L (ref 101–111)
CO2: 25 mmol/L (ref 22–32)
CREATININE: 0.63 mg/dL (ref 0.44–1.00)
GFR calc Af Amer: >60 mL/min (ref >60)
GFR calc non Af Amer: >60 mL/min (ref >60)
GLUCOSE: 101 mg/dL — AB (ref 65–99)
Potassium: 3.9 mmol/L (ref 3.5–5.1)
SODIUM: 139 mmol/L (ref 135–145)
Total Bilirubin: 0.8 mg/dL (ref 0.3–1.2)
Total Protein: 5.4 g/dL — ABNORMAL LOW (ref 6.5–8.1)

## 2017-12-13 LAB — CBC WITH DIFFERENTIAL/PLATELET
BASOS ABS: 0 10*3/uL (ref 0.0–0.1)
Basophils Relative: 0 %
EOS ABS: 0.2 10*3/uL (ref 0.0–0.7)
Eosinophils Relative: 4 %
HCT: 33 % — ABNORMAL LOW (ref 36.0–46.0)
HEMOGLOBIN: 11 g/dL — AB (ref 12.0–15.0)
LYMPHS ABS: 1.5 10*3/uL (ref 0.7–4.0)
Lymphocytes Relative: 33 %
MCH: 31.9 pg (ref 26.0–34.0)
MCHC: 33.3 g/dL (ref 30.0–36.0)
MCV: 95.7 fL (ref 78.0–100.0)
Monocytes Absolute: 0.4 10*3/uL (ref 0.1–1.0)
Monocytes Relative: 8 %
Neutro Abs: 2.6 10*3/uL (ref 1.7–7.7)
Neutrophils Relative %: 55 %
PLATELETS: 163 10*3/uL (ref 150–400)
RBC: 3.45 MIL/uL — AB (ref 3.87–5.11)
RDW: 13.3 % (ref 11.5–15.5)
WBC: 4.6 10*3/uL (ref 4.0–10.5)

## 2017-12-13 MED ORDER — ORAL CARE MOUTH RINSE
15.0000 mL | Freq: Two times a day (BID) | OROMUCOSAL | Status: DC
  Administered 2017-12-13 – 2017-12-18 (×9): 15 mL via OROMUCOSAL

## 2017-12-13 NOTE — Evaluation (Signed)
Occupational Therapy Assessment and Plan  Patient Details  Name: Molly [name] MRN: [id number] Date of Birth: [birth year]  OT Diagnosis: abnormal posture and muscle weakness (generalized) Rehab Potential: Rehab Potential (ACUTE ONLY): Good ELOS: 10-12 days   Today's Date: 12/13/2017 OT Individual Time: 1601-0932 OT Individual Time Calculation (min): 75 min     Problem List:  Patient Active Problem List   Diagnosis Date Noted  . Acute ischemic right MCA stroke ([place]) 12/12/2017  . Dysphagia, post-stroke   . Benign essential HTN   . PAF (paroxysmal atrial fibrillation) ([facility])   . Acute blood loss anemia   . Hypokalemia   . Neuropathic pain   . Stroke (cerebrum) ([place]) 12/07/2017  . Accelerating angina ([place])   . Bilateral lower extremity edema 08/15/2015  . Skin thinning 07/23/2015  . S/P lumbar spine operation 07/14/2015  . Deep vein thrombosis ([place]) 06/30/2015  . Reduced mobility 06/19/2015  . Degenerative spondylolisthesis 06/17/2015  . SPL (spondylolisthesis) 06/12/2015  . CN (constipation) 06/04/2015  . Arteriosclerosis of coronary artery 06/04/2015  . Diastolic dysfunction with heart failure ([place]) 06/04/2015  . History of digestive disease 06/04/2015  . HLD (hyperlipidemia) 06/04/2015  . BP (high blood pressure) 06/04/2015  . PONV (postoperative nausea and vomiting) 06/04/2015  . OP (osteoporosis) 06/04/2015  . Lumbar canal stenosis 04/07/2015  . CAD (coronary artery disease) 03/03/2012  . HTN (hypertension) 03/03/2012  . Hyperlipidemia 03/03/2012  . GERD (gastroesophageal reflux disease) 03/03/2012    Past Medical History:  Past Medical History:  Diagnosis Date  . Acid reflux   . Anemia   . Arthritis   . CAD (coronary artery disease)   . High cholesterol   . Hyperlipidemia   . Hypertension   . Osteoporosis   . Spinal stenosis    Past Surgical History:  Past Surgical History:  Procedure Laterality Date  . ABDOMINAL HYSTERECTOMY    . BACK SURGERY     Rod and Pins Placed   . CARDIAC CATHETERIZATION N/A 11/17/2015   Procedure: Left Heart Cath and Coronary Angiography;  Surgeon: Jettie Booze, MD;  Location: [facility];  Service: Cardiovascular;  Laterality: N/A;  . CAROTID STENT      Assessment & Plan Clinical Impression: Molly [name] a 84 y.o.femalewith history of CAD, h/o PE/DVT (after back surgery), RLE weakness, vertigo, PAF--no anticoagulation due epidural hematoma,  HTN who was admitted on 12/07/17 with left sided weakness, rightward gaze, difficulty speaking and inability to walk. History taken from chart review and daughter. CTA/P done revealing abnormal perfusion right frontal operculum with severe stenosis of right MCA mid M2 brach, no emergent large vessel infarct and aortic atherosclerosis. She was treated with tPA. 2 D echo with EF 55-60% with no wall abnormality and moderately calcified AV and calcified mitral annulus. She has had issues with hypotension past admission requiring fluid boluses. MRI brain 12/27 reviewed, negative for acute process. Neurology felt that episode due to aborted stroke with R M2 stenosis. Patient with R-MCA embolic stroke not seen on MRI.  TEE with loop recorder to be scheduled on outpatient basis. ASA changed to Plavix due to intracranial stenosis. She was placed on dysphagia 2, nectar liquids due to mild to moderate sensory oropharyngeal dysphagia but has had poor fluid intake with thick secretions. Was advanced to thin liquids-- with Provale cup and chin tuck to prevent aspiration. Therapy evaluations in progress and CIR recommended due to functional deficits Patient transferred to CIR on 12/12/2017 .    Patient currently requires mod  with basic self-care skills secondary to muscle weakness, decreased cardiorespiratoy endurance and decreased sitting balance, decreased standing balance, decreased postural control and decreased balance strategies.  Prior to hospitalization, patient could  complete ADLs with supervision.  Patient will benefit from skilled intervention to decrease level of assist with basic self-care skills and increase independence with basic self-care skills prior to discharge home with care partner.  Anticipate patient will require 24 hour supervision and follow up home health.  OT - End of Session Activity Tolerance: Tolerates 10 - 20 min activity with multiple rests Endurance Deficit: Yes Endurance Deficit Description: Requires significantly increased time and rest breaks to complete all tasks OT Assessment Rehab Potential (ACUTE ONLY): Good OT Patient demonstrates impairments in the following area(s): Balance;Pain;Cognition;Safety;Endurance OT Basic ADL's Functional Problem(s): Eating;Grooming;Bathing;Dressing;Toileting OT Transfers Functional Problem(s): Toilet;Tub/Shower OT Additional Impairment(s): None OT Plan OT Intensity: Minimum of 1-2 x/day, 45 to 90 minutes OT Frequency: 5 out of 7 days OT Duration/Estimated Length of Stay: 10-12 days OT Treatment/Interventions: Balance/vestibular training;Discharge planning;Pain management;Self Care/advanced ADL retraining;Therapeutic Activities;UE/LE Coordination activities;Functional mobility training;Cognitive remediation/compensation;Patient/family education;Therapeutic Exercise;DME/adaptive equipment instruction;Neuromuscular re-education;Psychosocial support;Splinting/orthotics;UE/LE Strength taining/ROM OT Self Feeding Anticipated Outcome(s): Supervision/ set-up OT Basic Self-Care Anticipated Outcome(s): Supervision-min A OT Toileting Anticipated Outcome(s): Supervision OT Bathroom Transfers Anticipated Outcome(s): Supervision-min A OT Recommendation Patient destination: Home Follow Up Recommendations: Home health OT;24 hour supervision/assistance Equipment Recommended: To be determined Equipment Details: Pt has elevated toilets and shower chair   Skilled Therapeutic Intervention Pt seen for OT eval  and ADL session. Pt in supine upon arrival with daughter and personal care attendant present. Pt eating breakfast and desiring to cont to eat. She was agreeable to transitioning to sitting EOB to eat meal. Pt with posterior lean in sitting, requiring frequent VCs for anterior weight shift to bring "nose over knees" in order to maintain static sitting balance. She completed stand pivot transfer to w/c using RW with mod A. Grooming tasks completed from w/c level at sink, assist required to manage dentures. She then voiced need for toileting task. Completed mod A stand pivot transfer to toilet using grab bars. Total A for toileting tasks. While seated on toilet, pt dressed with assist. She required increased time and cuing for attention to task. Pt with poor oral motor control and drooling throughout session. She was unable to focus on dressing task while attempting to manage secretions.  Pt returned to w/c at end of session, left seated in w/c with daughter present assisting and all needs in reach. Education provided throughout session regarding role of OT, POC, OT goals, and d/c planning.   OT Evaluation Precautions/Restrictions  Precautions Precautions: Fall Restrictions Weight Bearing Restrictions: No General Chart Reviewed: Yes Additional Pertinent History: hx of foot drop; no brace Pain Pain Assessment Pain Assessment: No/denies pain Home Living/Prior Functioning Home Living Family/patient expects to be discharged to:: Private residence Living Arrangements: Children Available Help at Discharge: Family, Available 24 hours/day Type of Home: House Home Access: Ramped entrance Home Layout: One level Bathroom Shower/Tub: Walk-in shower, Other (comment)(Grab bars) Bathroom Toilet: Handicapped height Bathroom Accessibility: Yes Additional Comments: Pt has also has hired A that comes in 4 days a week and helps with basic ADLs  Lives With: Daughter IADL History Homemaking Responsibilities:  No Leisure and Hobbies: Loves to play Wii bowling Prior Function Level of Independence: Needs assistance with ADLs, Needs assistance with gait, Needs assistance with tranfers(Pt performing at supervision-min A level PTA) Vocation: Retired Comments: has caregiver assist for LB bathing and dressing and to  get in and out of shower Vision Baseline Vision/History: Wears glasses Wears Glasses: At all times Patient Visual Report: No change from baseline Vision Assessment?: No apparent visual deficits Perception  Perception: Within Functional Limits Praxis Praxis: Intact Cognition Overall Cognitive Status: Impaired/Different from baseline Arousal/Alertness: Awake/alert Orientation Level: Person;Place;Situation Person: Oriented Place: Oriented Situation: Oriented Year: 2019 Month: January Day of Week: Correct Memory: Appears intact Memory Impairment: Decreased recall of new information Immediate Memory Recall: Sock;Blue;Bed Memory Recall: Bed;Blue;Sock Memory Recall Sock: Without Cue Memory Recall Blue: Without Cue Memory Recall Bed: Without Cue Attention: Sustained Sustained Attention: Appears intact Awareness: Appears intact Problem Solving: Appears intact Problem Solving Impairment: Functional complex;Verbal complex Executive Function: Initiating Safety/Judgment: Appears intact Comments: Pt moves very slowly and requires increased time with all tasks, pt's daughter reports this is not new Sensation Sensation Light Touch: Appears Intact Coordination Gross Motor Movements are Fluid and Coordinated: Yes(Generalized weakness and deconditioning) Fine Motor Movements are Fluid and Coordinated: Yes Motor  Motor Motor: Within Functional Limits Motor - Skilled Clinical Observations: Generalized weakness and deconditioning Trunk/Postural Assessment  Cervical Assessment Cervical Assessment: Exceptions to WFL(Forward head) Thoracic Assessment Thoracic Assessment: Exceptions to  WFL(Kyphotic) Lumbar Assessment Lumbar Assessment: Exceptions to WFL(Posterior pelvic tilt) Postural Control Postural Control: Deficits on evaluation(Posterior lean in sitting and standing positions)  Balance Balance Balance Assessed: Yes Dynamic Sitting Balance Dynamic Sitting - Balance Support: During functional activity;No upper extremity supported Dynamic Sitting - Level of Assistance: 4: Min assist Sitting balance - Comments: Sitting EOB to eat breakfast Static Standing Balance Static Standing - Balance Support: Bilateral upper extremity supported;During functional activity Static Standing - Level of Assistance: 4: Min assist Static Standing - Comment/# of Minutes: Standing during LB dressing and toileting tasks Extremity/Trunk Assessment RUE Assessment RUE Assessment: Within Functional Limits LUE Assessment LUE Assessment: Within Functional Limits   See Function Navigator for Current Functional Status.   Refer to Care Plan for Long Term Goals  Recommendations for other services: None    Discharge Criteria: Patient will be discharged from OT if patient refuses treatment 3 consecutive times without medical reason, if treatment goals not met, if there is a change in medical status, if patient makes no progress towards goals or if patient is discharged from hospital.  The above assessment, treatment plan, treatment alternatives and goals were discussed and mutually agreed upon: by patient and by family  Veer Elamin L 12/13/2017, 12:53 PM

## 2017-12-13 NOTE — Progress Notes (Signed)
Physical Therapy Assessment and Plan  Patient Details  Name: Ewelina Naves MRN: [id number] Date of Birth: [birth year]  PT Diagnosis: Abnormality of gait, Difficulty walking and Muscle weakness Rehab Potential: Excellent ELOS: 10-12 days   Today's Date: 12/13/2017 PT Individual Time: 1300-1400 PT Individual Time Calculation (min): 60 min    Problem List:  Patient Active Problem List   Diagnosis Date Noted  . Acute ischemic right MCA stroke ([place]) 12/12/2017  . Dysphagia, post-stroke   . Benign essential HTN   . PAF (paroxysmal atrial fibrillation) ([place])   . Acute blood loss anemia   . Hypokalemia   . Neuropathic pain   . Stroke (cerebrum) ([place]) 12/07/2017  . Accelerating angina ([place])   . Bilateral lower extremity edema 08/15/2015  . Skin thinning 07/23/2015  . S/P lumbar spine operation 07/14/2015  . Deep vein thrombosis ([place]) 06/30/2015  . Reduced mobility 06/19/2015  . Degenerative spondylolisthesis 06/17/2015  . SPL (spondylolisthesis) 06/12/2015  . CN (constipation) 06/04/2015  . Arteriosclerosis of coronary artery 06/04/2015  . Diastolic dysfunction with heart failure ([place]) 06/04/2015  . History of digestive disease 06/04/2015  . HLD (hyperlipidemia) 06/04/2015  . BP (high blood pressure) 06/04/2015  . PONV (postoperative nausea and vomiting) 06/04/2015  . OP (osteoporosis) 06/04/2015  . Lumbar canal stenosis 04/07/2015  . CAD (coronary artery disease) 03/03/2012  . HTN (hypertension) 03/03/2012  . Hyperlipidemia 03/03/2012  . GERD (gastroesophageal reflux disease) 03/03/2012    Past Medical History:  Past Medical History:  Diagnosis Date  . Acid reflux   . Anemia   . Arthritis   . CAD (coronary artery disease)   . High cholesterol   . Hyperlipidemia   . Hypertension   . Osteoporosis   . Spinal stenosis    Past Surgical History:  Past Surgical History:  Procedure Laterality Date  . ABDOMINAL HYSTERECTOMY    . BACK SURGERY     Rod and Pins Placed    . CARDIAC CATHETERIZATION N/A 11/17/2015   Procedure: Left Heart Cath and Coronary Angiography;  Surgeon: Jettie Booze, MD;  Location: [facility];  Service: Cardiovascular;  Laterality: N/A;  . CAROTID STENT      Assessment & Plan Clinical Impression: Patient is a 84 y.o.femalewith history of CAD, h/o PE/DVT (after back surgery), RLE weakness, vertigo, PAF--no anticoagulation due epidural hematoma,  HTN who was admitted on 12/07/17 with left sided weakness, rightward gaze, difficulty speaking and inability to walk. History taken from chart review and daughter. CTA/P done revealing abnormal perfusion right frontal operculum with severe stenosis of right MCA mid M2 brach, no emergent large vessel infarct and aortic atherosclerosis. She was treated with tPA. 2 D echo with EF 55-60% with no wall abnormality and moderately calcified AV and calcified mitral annulus. She has had issues with hypotension past admission requiring fluid boluses. MRI brain 12/27 reviewed, negative for acute process. Neurology felt that episode due to aborted stroke with R M2 stenosis. Patient with R-MCA embolic stroke not seen on MRI.  TEE with loop recorder to be scheduled on outpatient basis. ASA changed to Plavix due to intracranial stenosis. She was placed on dysphagia 2, nectar liquids due to mild to moderate sensory oropharyngeal dysphagia but has had poor fluid intake with thick secretions. Was advanced to thin liquids-- with Provale cup and chin tuck to prevent aspiration. Patient transferred to CIR on 12/12/2017 .   Patient currently requires mod with mobility secondary to muscle weakness, decreased cardiorespiratoy endurance, impaired timing and sequencing, decreased  coordination and decreased motor planning and decreased standing balance, decreased postural control and decreased balance strategies.  Prior to hospitalization, patient was supervision with mobility and lived with Spouse in a House home.  Home  access is  Ramped entrance.  Patient will benefit from skilled PT intervention to maximize safe functional mobility, minimize fall risk and decrease caregiver burden for planned discharge home with 24 hour supervision.  Anticipate patient will benefit from follow up [place] at discharge.  PT - End of Session Activity Tolerance: Tolerates < 10 min activity, no significant change in vital signs;Decreased this session Endurance Deficit: Yes Endurance Deficit Description: Requires significantly increased time and rest breaks to complete all tasks PT Assessment Rehab Potential (ACUTE/IP ONLY): Excellent PT Barriers to Discharge: Behavior PT Barriers to Discharge Comments: Moves very cautiously and slowly PT Patient demonstrates impairments in the following area(s): Balance;Behavior;Endurance;Motor;Safety PT Transfers Functional Problem(s): Bed Mobility;Bed to Chair;Car;Furniture PT Locomotion Functional Problem(s): Wheelchair Mobility;Ambulation;Stairs PT Plan PT Intensity: Minimum of 1-2 x/day ,45 to 90 minutes PT Frequency: 5 out of 7 days PT Duration Estimated Length of Stay: 10-12 days PT Treatment/Interventions: Ambulation/gait training;Disease management/prevention;Pain management;Stair training;Visual/perceptual remediation/compensation;Wheelchair propulsion/positioning;Therapeutic Activities;Patient/family education;Cognitive remediation/compensation;Functional electrical stimulation;DME/adaptive equipment instruction;Psychosocial support;Therapeutic Exercise;Balance/vestibular training;Community reintegration;Functional mobility training;Skin care/wound management;UE/LE Strength taining/ROM;UE/LE Coordination activities;Splinting/orthotics;Discharge planning;Neuromuscular re-education PT Transfers Anticipated Outcome(s): Supervision PT Locomotion Anticipated Outcome(s): Supervision PT Recommendation Follow Up Recommendations: Home health PT Patient destination: Home Equipment Recommended: To  be determined Equipment Details: Already has rollator, transfer chair  Skilled Therapeutic Intervention  Pt in w/c upon arrival finishing lunch, agreeable to session and no c/o pain. Hired caregiver present throughout session and assisted pt w/ providing PLOF and d/c planning information. Pt very slow to respond but did respond correctly per caregiver. Performed functional mobility as detailed below including w/c mobility, gait, transfers, and bed mobility. Pt required increased time to perform all mobility 2/2 very slow and cautious movement. Assisted pt w/ donning lounge clothes at EOB at end of session, mod assist overall for LE management. PT instructed patient in PT Evaluation; see below for results. PT educated patient in [place], rehab potential, rehab goals, and discharge recommendations. Ended session in supine and in care of caregiver, all needs met.   PT Evaluation Precautions/Restrictions Precautions Precautions: Fall Other Brace/Splint: pt has an AFO at home for her right leg, but it is too heavy for her to use.  Restrictions Weight Bearing Restrictions: No Pain Pain Assessment Pain Assessment: No/denies pain Pain Score: 0-No pain Home Living/Prior Functioning Home Living Available Help at Discharge: Available 24 hours/day;Personal care attendant(Hired aides come daily and almost nightly to assist w/ bathing, dressing, and eating) Type of Home: House Home Access: Ramped entrance Home Layout: One level Bathroom Shower/Tub: Walk-in shower;Other (comment) Bathroom Toilet: Handicapped height Bathroom Accessibility: Yes Additional Comments: Pt has also has hired A that comes in 4 days a week and helps with basic ADLs  Lives With: Spouse Prior Function Level of Independence: Needs assistance with ADLs;Needs assistance with gait;Needs assistance with tranfers(Able to use rollator w/ close supervision, limited community ambulation)  Able to Take Stairs?: Yes(limited, 1-2 at a time w/  assistance) Driving: No Vocation: Retired Comments: has caregiver assist for LB bathing and dressing and to get in and out of shower Vision/Perception  Perception Perception: Within Functional Limits Praxis Praxis: Intact  Cognition Overall Cognitive Status: Impaired/Different from baseline Arousal/Alertness: Awake/alert Orientation Level: Oriented X4 Attention: Sustained Sustained Attention: Appears intact Memory: Appears intact Memory Impairment: Decreased recall of new information Awareness: Appears  intact Problem Solving: Appears intact Problem Solving Impairment: Functional complex;Verbal complex Executive Function: Initiating Initiating: Impaired(very slow to initiate words and movements) Safety/Judgment: Appears intact Comments: Pt moves very slowly and requires increased time with all tasks, pt's caregiver reports this is not new Sensation Sensation Light Touch: Appears Intact Proprioception: Appears Intact Coordination Gross Motor Movements are Fluid and Coordinated: No Fine Motor Movements are Fluid and Coordinated: Yes Coordination and Movement Description: Gross motor movements impaired 2/2 severely decreased speed of movement Motor  Motor Motor: Within Functional Limits Motor - Skilled Clinical Observations: Generalized weakness and deconditioning  Mobility Bed Mobility Bed Mobility: Rolling Right;Rolling Left;Supine to Sit;Sit to Supine Rolling Right: 4: Min assist Rolling Right Details: Verbal cues for technique;Manual facilitation for weight shifting Rolling Left: 4: Min assist Rolling Left Details: Verbal cues for technique;Manual facilitation for weight shifting Supine to Sit: 4: Min guard Sit to Supine: 4: Min guard Transfers Transfers: Yes Sit to Stand: 4: Min assist Sit to Stand Details: Verbal cues for technique;Manual facilitation for weight shifting Stand to Sit: 4: Min assist Stand to Sit Details (indicate cue type and reason): Verbal cues  for technique;Manual facilitation for weight shifting Stand Pivot Transfers: 3: Mod assist Stand Pivot Transfer Details: Verbal cues for sequencing;Verbal cues for technique;Verbal cues for precautions/safety;Manual facilitation for weight shifting Locomotion  Ambulation Ambulation: Yes Ambulation/Gait Assistance: 4: Min guard Ambulation Distance (Feet): 50 Feet Assistive device: Rolling walker Gait Gait: Yes Gait Pattern: Impaired Gait Pattern: Narrow base of support;Poor foot clearance - right;Shuffle;Decreased dorsiflexion - right;Decreased hip/knee flexion - right Gait velocity: decreased Stairs / Additional Locomotion Stairs: No Wheelchair Mobility Wheelchair Mobility: Yes Wheelchair Assistance: 5: Personnel officer Assistance Details: Verbal cues for technique;Verbal cues for sequencing;Verbal cues for Information systems manager: Both lower extermities;Both upper extremities Wheelchair Parts Management: Needs assistance Distance: 53'  Trunk/Postural Assessment  Cervical Assessment Cervical Assessment: Exceptions to WFL(forward head) Thoracic Assessment Thoracic Assessment: Exceptions to WFL(increased kyphosis) Lumbar Assessment Lumbar Assessment: Exceptions to WFL(posterior pelvic tilt) Postural Control Postural Control: Deficits on evaluation(delayed, increased posterior lean)  Balance Balance Balance Assessed: Yes Static Sitting Balance Static Sitting - Balance Support: No upper extremity supported Static Sitting - Level of Assistance: 5: Stand by assistance Dynamic Sitting Balance Dynamic Sitting - Balance Support: During functional activity;No upper extremity supported Dynamic Sitting - Level of Assistance: 4: Min Insurance risk surveyor Standing - Balance Support: Bilateral upper extremity supported;During functional activity Static Standing - Level of Assistance: 4: Min assist Extremity Assessment  RUE Assessment RUE  Assessment: Within Functional Limits LUE Assessment LUE Assessment: Within Functional Limits RLE Assessment RLE Assessment: Within Functional Limits LLE Assessment LLE Assessment: Within Functional Limits   See Function Navigator for Current Functional Status.   Refer to Care Plan for Long Term Goals  Recommendations for other services: None   Discharge Criteria: Patient will be discharged from PT if patient refuses treatment 3 consecutive times without medical reason, if treatment goals not met, if there is a change in medical status, if patient makes no progress towards goals or if patient is discharged from hospital.  The above assessment, treatment plan, treatment alternatives and goals were discussed and mutually agreed upon: by patient  Waylon Koffler K Arnette 12/13/2017, 3:03 PM

## 2017-12-13 NOTE — Evaluation (Addendum)
Speech Language Pathology Assessment and Plan  Patient Details  Name: Molly [name] MRN: [id number] Date of Birth: 1929-10-13  SLP Diagnosis: Dysarthria;Dysphagia;Cognitive Impairments  Rehab Potential: Good ELOS: 10-12 days    Today's Date: 12/14/2017 SLP Individual Time:  -  1100-1215 (75 min)    Problem List:  Patient Active Problem List   Diagnosis Date Noted  . Acute ischemic right MCA stroke ([place]) 12/12/2017  . Dysphagia, post-stroke   . Benign essential HTN   . PAF (paroxysmal atrial fibrillation) ([place])   . Acute blood loss anemia   . Hypokalemia   . Neuropathic pain   . Stroke (cerebrum) ([place]) 12/07/2017  . Accelerating angina ([place])   . Bilateral lower extremity edema 08/15/2015  . Skin thinning 07/23/2015  . S/P lumbar spine operation 07/14/2015  . Deep vein thrombosis ([place]) 06/30/2015  . Reduced mobility 06/19/2015  . Degenerative spondylolisthesis 06/17/2015  . SPL (spondylolisthesis) 06/12/2015  . CN (constipation) 06/04/2015  . Arteriosclerosis of coronary artery 06/04/2015  . Diastolic dysfunction with heart failure ([place]) 06/04/2015  . History of digestive disease 06/04/2015  . HLD (hyperlipidemia) 06/04/2015  . BP (high blood pressure) 06/04/2015  . PONV (postoperative nausea and vomiting) 06/04/2015  . OP (osteoporosis) 06/04/2015  . Lumbar canal stenosis 04/07/2015  . CAD (coronary artery disease) 03/03/2012  . HTN (hypertension) 03/03/2012  . Hyperlipidemia 03/03/2012  . GERD (gastroesophageal reflux disease) 03/03/2012   Past Medical History:  Past Medical History:  Diagnosis Date  . Acid reflux   . Anemia   . Arthritis   . CAD (coronary artery disease)   . High cholesterol   . Hyperlipidemia   . Hypertension   . Osteoporosis   . Spinal stenosis    Past Surgical History:  Past Surgical History:  Procedure Laterality Date  . ABDOMINAL HYSTERECTOMY    . BACK SURGERY     Rod and Pins Placed   . CARDIAC CATHETERIZATION N/A 11/17/2015    Procedure: Left Heart Cath and Coronary Angiography;  Surgeon: Jettie Booze, MD;  Location: [facility];  Service: Cardiovascular;  Laterality: N/A;  . CAROTID STENT      Assessment / Plan / Recommendation Clinical Impression 84 y.o. female past medical history of coronary artery disease, hyperlipidemia, hypertension, atrial fibrillation at some point in the past on no anticoagulation because of resolution of A. fib at that time, presenting to the emergency room with a last known normal of 9:15 AM on 12/07/2017 with left-sided weakness and inability to stand when she wanted to get up from the breakfast table.` Pt administered TPN and symptoms have resolved somewhat; MRI 12/08/17 negative for acute processes.  Pt admitted to CIR on 12/12/2017 and evaluated for swallow and cognitive linguistic needs on 12/13/2017. Pt presented with moderate oral dysphagia, demonstrating weak labial seal, lingual movement on left side and delay/oral holding. Pt demonstrated overall poor management of secretions unable to swallow salvia without Max cues. Pt demonstrated mild s/s aspiration, cough immediate and delayed, given cup sips and provale cup sips of thin liquid. Pt demonstrated prolonged mastication of dys 3 and regular textured food trials.SLP recommends to continue current diet dys 2 and thin liquid via provale cup to increase oral control and swallow compensatory strategy chin tuck with all substances. Pt presents with moderate delayed processing/responding time during communicative interactions and requires extended time to complete a directive. Pt demonstrated mild impairment in mildly complex problem solving, recall of mildly complex information and severe impairment in word finding in divergent  tasks, however functionally mild impairment in conversation and conformation naming. Pt scored 24 out 30 on MOCA version 7.1 (n=.26.) Pt and caregiver report baseline processing and word finding impairments,  however symptoms have been exhasterbed due to CVA. Pt demonstrates reduced intelligibility at phrase/setence level with 70% intelligibility with changes in vocal quality weak and high pitch noted by pt and caregiver.Pt would benefit from skilled ST services in order to maximize functional independence and reduce burden of care.   Skilled Therapeutic Interventions          Skilled ST services focused on swallow skills. SLP facilitated PO consumption of thin liquid via cup and provale cup, pt required supervision cues to utilize chin tuck and demonstrated mild s/s aspiration with both types of cups. SLP provided max a verbal cues to initial swallow to control secretions. Pt was left in room with caregiver. reccomend to continue skilled ST services.    SLP Assessment  Patient will need skilled Speech Lanaguage Pathology Services during CIR admission    Recommendations  SLP Diet Recommendations: Thin;Dysphagia 2 (Fine chop) Liquid Administration via: Cup Medication Administration: Whole meds with puree Supervision: Patient able to self feed;Staff to assist with self feeding;Full supervision/cueing for compensatory strategies Compensations: Slow rate;Small sips/bites;Chin tuck Postural Changes and/or Swallow Maneuvers: Seated upright 90 degrees Oral Care Recommendations: Oral care QID Patient destination: Home Follow up Recommendations: 24 hour supervision/assistance;Home Health SLP    SLP Frequency 3 to 5 out of 7 days   SLP Duration  SLP Intensity  SLP Treatment/Interventions 10-12 days  Minumum of 1-2 x/day, 30 to 90 minutes  Cognitive remediation/compensation;Cueing hierarchy;Dysphagia/aspiration precaution training;Functional tasks;Patient/family education    Pain Pain Assessment Pain Assessment: No/denies pain  Prior Functioning Cognitive/Linguistic Baseline: Baseline deficits Baseline deficit details: decreased processing, some word finding Type of Home: House  Lives With:  Spouse Available Help at Discharge: Available 24 hours/day;Personal care attendant Vocation: Retired  Function:  Eating Eating   Modified Consistency Diet: No(Thin via cup and provale cup) Eating Assist Level: Set up assist for;Supervision or verbal cues           Cognition Comprehension    Expression      Social Interaction    Problem Solving    Memory     Short Term Goals: Week 1: SLP Short Term Goal 1 (Week 1): Pt will demonstrate efficient mastication and oral clearnce of dys 3 textured trials with min s/s aspriation and Mod I use of swallow startegies. SLP Short Term Goal 2 (Week 1): Pt will tolerate thin liquid trials via cup with min s/s aspriation and Mod I use of swallow strategies. SLP Short Term Goal 3 (Week 1): Pt will increase vocal intensity at the phrase level to achieve 90% intelligibility with Min A verbal cues.  SLP Short Term Goal 4 (Week 1): Pt will demonstrate verbal/phsyical response within 3 seconds during functional tasks given Min A verbal/visual cues.  SLP Short Term Goal 5 (Week 1): Pt demonstrate functional problem solving for mildly complex tasks with supervision A verbal cues SLP Short Term Goal 6 (Week 1): Pt will utilize word finding strategies during divergent naming tasks with Mod a verbal cues.   Refer to Care Plan for Long Term Goals  Recommendations for other services: None   Discharge Criteria: Patient will be discharged from SLP if patient refuses treatment 3 consecutive times without medical reason, if treatment goals not met, if there is a change in medical status, if patient makes no progress towards goals  or if patient is discharged from hospital.  The above assessment, treatment plan, treatment alternatives and goals were discussed and mutually agreed upon: by patient  Miachel Nardelli  [facility] 12/14/2017, 10:29 AM

## 2017-12-13 NOTE — Progress Notes (Signed)
Subjective/Complaints:   Objective: Vital Signs: Blood pressure 138/66, pulse 70, temperature 98.8 F (37.1 C), temperature source Oral, resp. rate 16, height 5' 2"  (1.575 m), weight 73.7 kg (162 lb 7.7 oz), SpO2 96 %. No results found. Results for orders placed or performed during the hospital encounter of 12/12/17 (from the past 72 hour(s))  CBC WITH DIFFERENTIAL     Status: Abnormal   Collection Time: 12/13/17  6:29 AM  Result Value Ref Range   WBC 4.6 4.0 - 10.5 K/uL   RBC 3.45 (L) 3.87 - 5.11 MIL/uL   Hemoglobin 11.0 (L) 12.0 - 15.0 g/dL   HCT 33.0 (L) 36.0 - 46.0 %   MCV 95.7 78.0 - 100.0 fL   MCH 31.9 26.0 - 34.0 pg   MCHC 33.3 30.0 - 36.0 g/dL   RDW 13.3 11.5 - 15.5 %   Platelets 163 150 - 400 K/uL   Neutrophils Relative % 55 %   Neutro Abs 2.6 1.7 - 7.7 K/uL   Lymphocytes Relative 33 %   Lymphs Abs 1.5 0.7 - 4.0 K/uL   Monocytes Relative 8 %   Monocytes Absolute 0.4 0.1 - 1.0 K/uL   Eosinophils Relative 4 %   Eosinophils Absolute 0.2 0.0 - 0.7 K/uL   Basophils Relative 0 %   Basophils Absolute 0.0 0.0 - 0.1 K/uL  Comprehensive metabolic panel     Status: Abnormal   Collection Time: 12/13/17  6:29 AM  Result Value Ref Range   Sodium 139 135 - 145 mmol/L   Potassium 3.9 3.5 - 5.1 mmol/L   Chloride 105 101 - 111 mmol/L   CO2 25 22 - 32 mmol/L   Glucose, Bld 101 (H) 65 - 99 mg/dL   BUN 13 6 - 20 mg/dL   Creatinine, Ser 0.63 0.44 - 1.00 mg/dL   Calcium 9.6 8.9 - 10.3 mg/dL   Total Protein 5.4 (L) 6.5 - 8.1 g/dL   Albumin 3.0 (L) 3.5 - 5.0 g/dL   AST 29 15 - 41 U/L   ALT 22 14 - 54 U/L   Alkaline Phosphatase 30 (L) 38 - 126 U/L   Total Bilirubin 0.8 0.3 - 1.2 mg/dL   GFR calc non Af Amer >60 >60 mL/min   GFR calc Af Amer >60 >60 mL/min    Comment: (NOTE) The eGFR has been calculated using the CKD EPI equation. This calculation has not been validated in all clinical situations. eGFR's persistently <60 mL/min signify possible Chronic Kidney Disease.    Anion  gap 9 5 - 15     HEENT: mild left facial droop Cardio: RRR and no murmur Resp: CTA B/L and unlabored GI: BS positive and NT, ND Extremity:  No Edema Skin:   Other IV site CDI left forearm, ecchymosis B forearms Neuro: Alert/Oriented, Cranial Nerve Abnormalities Left central VII, Abnormal Motor 4+/5 in BUE and BLE except 3+ R ankle DF and Dysarthric Musc/Skel:  Other no pain with UE or LE AROM Gen NAD   Assessment/Plan: 1. Functional deficits secondary to Right MCA infarct with worsened gait and dysarthria and dysphagia, ADL status has declined as well which require 3+ hours per day of interdisciplinary therapy in a comprehensive inpatient rehab setting. Physiatrist is providing close team supervision and 24 hour management of active medical problems listed below. Physiatrist and rehab team continue to assess barriers to discharge/monitor patient progress toward functional and medical goals. FIM:       Function - Toileting Toileting activity did not occur:  No continent bowel/bladder event           Function - Comprehension Comprehension: Auditory  Function - Expression Expression: Verbal        Function - Memory Patient normally able to recall (first 3 days only): Current season, Location of own room, That he or she is in a hospital  Medical Problem List and Plan: 1.  Weakness, dysphagia, limitations with mobility and ADLs secondary to suspected R-MCA embolic stroke. CIR evals today 2.  DVT Prophylaxis/Anticoagulation: Pharmaceutical: Lovenox, PLT 163 3. Peripheral neuropathy/Pain Management: Resume bedtime tylenol. Will schedule home dose Neurontin to help with insomnia as well as BLE neuropathy (used prn at home).  4. Mood: LCSW to follow for evaluation and support.  5. Neuropsych: This patient is not fully capable of making decisions on her own behalf. 6. Skin/Wound Care: routine pressure relief measures.  7. Fluids/Electrolytes/Nutrition: Monitor I/O.  Encourage fluid intake--renal status WNL. Discontinue IVF to avoid overload.  8. HTN: Avoid hypotension to allow for adequate perfusion  Controlled 1/1 Vitals:   12/13/17 0803 12/13/17 0808  BP: 138/66   Pulse: (!) 59 70  Resp:    Temp:    SpO2:     9. CAD: On metoprolol, Crestor and Imdur.  10 PAF: To follow up with cardiology for TEE/loop recorder after discharge.  11. GERD: Resume PPI.  12. Spinal stenosis with right foot drop: Neurontin at bedtime prn neuropathy. She does not like to use her AFO.  13. ABLA: Repeat CBC stable Hgb 11 14. Constipation: Better per family--continue Senna 2  bid.  15. Hypokalemia: Resolved with supplement.KCL 3.9 on 1/1    LOS (Days) 1 A FACE TO FACE EVALUATION WAS PERFORMED  Charlett Blake 12/13/2017, 10:42 AM

## 2017-12-14 ENCOUNTER — Inpatient Hospital Stay (HOSPITAL_COMMUNITY): Payer: Medicare Other | Admitting: Occupational Therapy

## 2017-12-14 ENCOUNTER — Inpatient Hospital Stay (HOSPITAL_COMMUNITY): Payer: Medicare Other

## 2017-12-14 ENCOUNTER — Inpatient Hospital Stay (HOSPITAL_COMMUNITY): Payer: Medicare Other | Admitting: Physical Therapy

## 2017-12-14 NOTE — Progress Notes (Signed)
Social Work Assessment and Plan Social Work Assessment and Plan  Patient Details  Name: Molly [name] MRN: [id number] Date of Birth: March 07, 1929  Today's Date: 12/14/2017  Problem List:  Patient Active Problem List   Diagnosis Date Noted  . Acute ischemic right MCA stroke ([place]) 12/12/2017  . Dysphagia, post-stroke   . Benign essential HTN   . PAF (paroxysmal atrial fibrillation) ([place])   . Acute blood loss anemia   . Hypokalemia   . Neuropathic pain   . Stroke (cerebrum) ([place]) 12/07/2017  . Accelerating angina ([place])   . Bilateral lower extremity edema 08/15/2015  . Skin thinning 07/23/2015  . S/P lumbar spine operation 07/14/2015  . Deep vein thrombosis ([place]) 06/30/2015  . Reduced mobility 06/19/2015  . Degenerative spondylolisthesis 06/17/2015  . SPL (spondylolisthesis) 06/12/2015  . CN (constipation) 06/04/2015  . Arteriosclerosis of coronary artery 06/04/2015  . Diastolic dysfunction with heart failure ([place]) 06/04/2015  . History of digestive disease 06/04/2015  . HLD (hyperlipidemia) 06/04/2015  . BP (high blood pressure) 06/04/2015  . PONV (postoperative nausea and vomiting) 06/04/2015  . OP (osteoporosis) 06/04/2015  . Lumbar canal stenosis 04/07/2015  . CAD (coronary artery disease) 03/03/2012  . HTN (hypertension) 03/03/2012  . Hyperlipidemia 03/03/2012  . GERD (gastroesophageal reflux disease) 03/03/2012   Past Medical History:  Past Medical History:  Diagnosis Date  . Acid reflux   . Anemia   . Arthritis   . CAD (coronary artery disease)   . High cholesterol   . Hyperlipidemia   . Hypertension   . Osteoporosis   . Spinal stenosis    Past Surgical History:  Past Surgical History:  Procedure Laterality Date  . ABDOMINAL HYSTERECTOMY    . BACK SURGERY     Rod and Pins Placed   . CARDIAC CATHETERIZATION N/A 11/17/2015   Procedure: Left Heart Cath and Coronary Angiography;  Surgeon: Jettie Booze, MD;  Location: [facility];  Service:  Cardiovascular;  Laterality: N/A;  . CAROTID STENT     Social History:  reports that  has never smoked. she has never used smokeless tobacco. She reports that she does not drink alcohol or use drugs.  Family / Support Systems Marital Status: Widow/Widower Patient Roles: Parent Children: Molly [name] [phone] Other Supports: Molly [name]-daughter [phone] Anticipated Caregiver: Molly [name] and hired caregivers Ability/Limitations of Caregiver: Daughter works from home and has two different caregivers for [employer] Caregiver Availability: 24/7 Family Dynamics: Close knit with both daughter's they have always looked out for each other and now Mom is sick they will do whatever is needed to provide the care she needs and keep her at home. Where she wants to be.  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 She was able to do some of her tasks on her own and wants to get back to this. She realizes it may take time but is willing to work on this while here. Her daughter's are very involved and  supportive. Recent Psychosocial Issues: other health issues and R-LE weakness from previous back surgeries Pyschiatric History: No history deferred depression screen due to doing well and able to explain her health issues. Pt  may benefit from seeing neuro-psych while here for coping she has been through a lot lately. She is one who is optimistic and looks to the positive.  Substance Abuse History: No issues  Patient / Family Perceptions, Expectations & Goals Pt/Family understanding of illness & functional limitations: Pt and daughter can explain her health issues and stroke. Both are pleased with the progress she has made and hopeful this will continue while here. Pt relaizes she is slow but keeps plugging  along in her therapies. Daughter has been here and observed her in therapies. Premorbid pt/family roles/activities: Mom, retiree, grandmother, church member, etc Anticipated changes in roles/activities/participation: resume Pt/family expectations/goals: Pt states: " I want to get back to where I was before this."  Molly [name] states: " I hope she does well and will do whatever she needs from [place]. We want her happy and comfortable."  [employer]: Other (Comment)(has private duty hired assist) Premorbid Home Care/DME Agencies: Other (Comment)(had [facility] in the past) Transportation available at discharge: Family Resource referrals recommended: Neuropsychology, Support group (specify)  Discharge Planning Living Arrangements: Children Support Systems: Children, Other relatives, Friends/neighbors, Immunologist, Other (Comment)(private duty hires) Type of Residence: Private residence [employer] Resources: Multimedia programmer (specify)(UHC-Medicare) Museum/gallery curator Resources: Fish farm manager, Family Support Financial Screen Referred: No Living Expenses: Lives with family Money Management: Family Does the patient have any problems obtaining your medications?: No Home Management: Caregivers and daughter Patient/Family Preliminary Plans: Return home to daughter's-Molly [name]'s home where she will be receiving 24 hr care. Made aware this will be needed upon discharge from the hospital. Pt is pushing herself  and willing to do her part to recover from this stroke. Social Work Anticipated Follow Up Needs: HH/OP, Support Group  Clinical Impression Pleasant female who is willing to push herself and do all that she can to achieve her goals of getting back to baseline function. She did require care prior to admission but will need 24 hr now and daughter is aware of this and is arranging for this via her two caregviers. Will work on discharge needs and plan for discharge 1/8.  Elease Hashimoto 12/14/2017, 1:47 PM

## 2017-12-14 NOTE — Progress Notes (Signed)
Occupational Therapy Session Note  Patient Details  Name: Molly [name] MRN: [id number] Date of Birth: 29-Dec-1928  Today's Date: 12/14/2017 OT Individual Time: 1100-1200 OT Individual Time Calculation (min): 60 min    Short Term Goals: Week 1:  OT Short Term Goal 1 (Week 1): Pt will complete toileting task with min A OT Short Term Goal 2 (Week 1): Pt will complete functional transfers with min A OT Short Term Goal 3 (Week 1): Pt will stand to complete 1 grooming task in order to increase functional activity tolerance OT Short Term Goal 4 (Week 1): Pt will maintain sitting balance EOB during functional task with supervision/VCs  Skilled Therapeutic Interventions/Progress Updates:    Pt seen for OT ADL bathing/dressing session. Pt sitting up in w/c upon arrival with personal care ttendant prsent. Pt able to recall plan for shower today. She completed min A stand pivot transfer to shower using grab bars. She requires significantly increased time for all tasks due to slow movements and delayed processing. She bathed seated on tub bench, assist for B feet. She stood at grab bar to complete buttock hygiene. She returned to w/c to dress, requiring significantly increased time but able to thread B LEs into pants and stood at sink to complete clothing management.  Pt left seated in w/c at end of session, all needs in reach with personal care attendant present.   Therapy Documentation Precautions:  Precautions Precautions: Fall Other Brace/Splint: pt has an AFO at home for her right leg, but it is too heavy for her to use.  Restrictions Weight Bearing Restrictions: No Pain:   no/denies pain  See Function Navigator for Current Functional Status.   Therapy/Group: Individual Therapy  Anajah Sterbenz L 12/14/2017, 7:13 AM

## 2017-12-14 NOTE — Care Management Note (Signed)
Inpatient [facility] Individual Statement of Services  Patient Name:  Molly [name]  Date:  12/14/2017  Welcome to the [facility].  Our goal is to provide you with an individualized program based on your diagnosis and situation, designed to meet your specific needs.  With this comprehensive rehabilitation program, you will be expected to participate in at least 3 hours of rehabilitation therapies Monday-Friday, with modified therapy programming on the weekends.  Your rehabilitation program will include the following services:  Physical Therapy (PT), Occupational Therapy (OT), Speech Therapy (ST), 24 hour per day rehabilitation nursing, Neuropsychology, Case Management (Social Worker), Rehabilitation Medicine, Nutrition Services and Pharmacy Services  Weekly team conferences will be held on Wednesday to discuss your progress.  Your Social Worker will talk with you frequently to get your input and to update you on team discussions.  Team conferences with you and your family in attendance may also be held.  Expected length of stay: 10-12 days Overall anticipated outcome: supervision-min level  Depending on your progress and recovery, your program may change. Your Social Worker will coordinate services and will keep you informed of any changes. Your Social Worker's name and contact numbers are listed  below.  The following services may also be recommended but are not provided by the [facility]:    [facility] will be made to provide these services after discharge if needed.  Arrangements include referral to agencies that provide these services.  Your insurance has been verified to be:  UHC-Medicare Your primary doctor is:  [name]  Pertinent information will be shared with your doctor and your insurance company.  Social Worker:  Ovidio Kin, [place] or ([phone]  Information discussed with and copy given to patient by: Elease Hashimoto, 12/14/2017, 1:34 PM

## 2017-12-14 NOTE — Patient Care Conference (Signed)
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 Acute ischemic right MCA stroke ([place]) 12/12/2017  . Dysphagia, post-stroke   . Benign essential HTN   . PAF (paroxysmal atrial fibrillation) ([place])   . Acute blood loss anemia   . Hypokalemia   . Neuropathic pain   . Stroke (cerebrum) ([place]) 12/07/2017  . Accelerating angina ([place])   . Bilateral lower extremity edema 08/15/2015  . Skin thinning 07/23/2015  . S/P lumbar spine operation 07/14/2015  . Deep vein thrombosis ([place]) 06/30/2015  . Reduced mobility 06/19/2015  . Degenerative spondylolisthesis 06/17/2015  . SPL (spondylolisthesis) 06/12/2015  . CN (constipation) 06/04/2015  . Arteriosclerosis of coronary artery 06/04/2015  . Diastolic dysfunction with heart failure ([place]) 06/04/2015  . History of digestive disease 06/04/2015  . HLD (hyperlipidemia) 06/04/2015  . BP (high blood pressure) 06/04/2015  . PONV (postoperative nausea and vomiting) 06/04/2015  . OP (osteoporosis) 06/04/2015  . Lumbar canal stenosis 04/07/2015  . CAD (coronary artery disease) 03/03/2012  . HTN (hypertension) 03/03/2012  . Hyperlipidemia 03/03/2012  . GERD (gastroesophageal reflux disease) 03/03/2012    Expected Discharge Date: Expected Discharge Date: 12/20/17  Team Members Present: Physician leading conference: Dr. Alysia Penna Social Worker Present: Ovidio Kin, LCSW Nurse  Present: Frances Maywood, RN PT Present: Barrie Folk, PT OT Present: Napoleon Form, OT SLP Present: Charolett Bumpers, SLP PPS Coordinator present : Daiva Nakayama, RN, CRRN     Current Status/Progress Goal Weekly Team Focus  Medical   bradykinesia, flexed shuffles, Dysphagia, parkinsonism  safe po, reduce fall risk  consider Parkinsonism as diagnosis   Bowel/Bladder   Incontinent of B&B.  Manage toileting needs with moderate assistance.  Keep patient clean and dry.   Swallow/Nutrition/ Hydration   dys 2, thin via provale cup, Mod I use of chin tuck  Mod I  Trials of thn via cup sips and dys 3   ADL's   Min-mod A stand pivot transfers; mod A LB dressing; min A toileting, Requires significantly increased time with all tasks  Supervision-min A overall; mod A LB dressing  Activity tolerance, ADL re-training, d/c planning   Mobility   min-mod assist, min guard gait 50' w/ RW, supervision w/c, very slow moving cand cautious (caregiver reports this is baseline somewhat)   supervision overall   functional mobility and strength, endurance and tolerance to OOB activity, d/c planning   Communication   70% intelligibile phrase/sentence Mod   Supervision  Intelligibility strategies, vocal intensity    Safety/Cognition/ Behavioral Observations  delayed processing/extra time required, word finding max-mod structed tasks, problem solving Min,   Supervision   midly complex problem solving, word finding strategies , task initation/processing speed   Pain   C/o of mild to moderate H/A's controled with Tylenol.  <3.  Assess pain q shift and PRN and administer meds as needed.   Skin   Skin intact with no areas of concern.  Only some bruising R/T IV's and blood draws.  Maintain skin integrity.  Assess skin q shift and PRN.      *See Care Plan and progress notes for long and short-term goals.     Barriers to Discharge  Current Status/Progress Possible Resolutions Date Resolved   Physician    Other (comments)   bradykinesia  progressing toward goals  ask Neuro whether PD may be diagnosis      Nursing  Medical stability               PT  Behavior  moves very cautiously and slowly               OT                  SLP                SW                Discharge Planning/Teaching Needs:    Home with daughter and her caregivers' from prior to admission. Daughter to increase them to 24 hr for pt's needs. Daughter and caregivers have been here and observed pt in therapies.     Team Discussion:  Goals of supervision overall, currently min-mod level of assist. Pt moves very slowly almost parkinsonian presentation. Currently on Dys 2 thin liquid diet with chin tuck-pt very compliant in chin tuck. Daughter aware pt will require 24 hr care at discharge from rehab.  Revisions to Treatment Plan:  DC 1/8    Continued Need for Acute Rehabilitation Level of Care: The patient requires daily medical management by a physician with specialized training in physical medicine and rehabilitation for the following conditions: Daily direction of a multidisciplinary physical rehabilitation program to ensure safe treatment while eliciting the highest outcome that is of practical value to the patient.: Yes Daily medical management of patient stability for increased activity during participation in an intensive rehabilitation regime.: Yes Daily analysis of laboratory values and/or radiology reports with any subsequent need for medication adjustment of medical intervention for : Neurological problems  Courtez Twaddle, Gardiner Rhyme 12/14/2017, 1:49 PM

## 2017-12-14 NOTE — Progress Notes (Signed)
Physical Therapy Session Note  Patient Details  Name: Molly [name] MRN: [id number] Date of Birth: [birth year]  Today's Date: 12/14/2017 PT Individual Time: 1305-1420 PT Individual Time Calculation (min): 75 min   Short Term Goals: Week 1:  PT Short Term Goal 1 (Week 1): Pt will transfer bed<>chair w/ min assist PT Short Term Goal 2 (Week 1): Pt will perform bed mobility w/ supervision PT Short Term Goal 3 (Week 1): Pt will tolerate 30 min of OOB/upright activity w/o increase in fatigue  Skilled Therapeutic Interventions/Progress Updates:   Pt received sitting in WC and agreeable to PT. Pt transported to rehab gym in [facility] Gait training instructed by PT with ROllator and min-supervision assist x 157ft +65ft +28ft. Min cues for AD mangement in turns and improved posture. Sit<>stand transfers completed with min assist throughout treatment with min cues for anterior weight shift and proper UE placement. Nustep reciprocal movement and endurance training x 8 minutes with BUE and BLE, Level 2>3. Min cues for full ROM and increases Steps per Mminute. PT instructed pt in gait training to ascent/descend ramp with min assist and BUE support on Rollator. Car transfer instructed by PT with Rollator and mod assist.  Pt returned to room and performed stand pivot transfer to bed with min assist. Sit>supine completed with min assist to control the RLE. Once in bed, pt reports need for [facility]. Supien>sit with min assist. RN present to assist pt to bathroom.          Therapy Documentation Precautions:  Precautions Precautions: Fall Other Brace/Splint: pt has an AFO at home for her right leg, but it is too heavy for her to use.  Restrictions Weight Bearing Restrictions: No    Vital Signs: Therapy Vitals Temp: 97.9 F (36.6 C) Temp Source: Oral Pulse Rate: 67 Resp: 18 BP: 129/65 Patient Position (if appropriate): Lying Oxygen Therapy SpO2: 100 % O2 Device: Not Delivered Pain: Pain  Assessment Pain Assessment: No/denies pain   See Function Navigator for Current Functional Status.   Therapy/Group: Individual Therapy  Lorie Phenix 12/14/2017, 3:55 PM

## 2017-12-14 NOTE — IPOC Note (Signed)
Overall Plan of Care [facility]) Patient Details Name: Molly [name] MRN: [id number] DOB: [birth year]  Admitting Diagnosis: <principal problem not specified>  Hospital Problems: Active Problems:   Acute ischemic right MCA stroke [facility])     Functional Problem List: Nursing Bladder, Bowel, Edema, Endurance, Medication Management, Perception, Skin Integrity  PT Balance, Behavior, Endurance, Motor, Safety  OT Balance, Pain, Cognition, Safety, Endurance  SLP Cognition, Motor  TR         Basic ADL's: OT Eating, Grooming, Bathing, Dressing, Toileting     Advanced  ADL's: OT       Transfers: PT Bed Mobility, Bed to Chair, Car, Manufacturing systems engineer, Metallurgist: PT Emergency planning/management officer, Ambulation, Stairs     Additional Impairments: OT None  SLP Swallowing, Social Cognition   Memory, Problem Solving  TR      Anticipated Outcomes Item Anticipated Outcome  Self Feeding Supervision/ set-up  Swallowing  Mod I   Basic self-care  Supervision-min A  Toileting  Supervision   Bathroom Transfers Supervision-min A  Bowel/Bladder  Min assist to [facility] or bathroom  Transfers  Supervision  Locomotion  Supervision  Communication  Supervision   Cognition  Supervision   Pain  <3  Safety/Judgment  Min assist with wheelchair or walker   Therapy Plan: PT Intensity: Minimum of 1-2 x/day ,45 to 90 minutes PT Frequency: 5 out of 7 days PT Duration Estimated Length of Stay: 10-12 days OT Intensity: Minimum of 1-2 x/day, 45 to 90 minutes OT Frequency: 5 out of 7 days OT Duration/Estimated Length of Stay: 10-12 days SLP Intensity: Minumum of 1-2 x/day, 30 to 90 minutes SLP Frequency: 3 to 5 out of 7 days SLP Duration/Estimated Length of Stay: 10-12 days    Team Interventions: Nursing Interventions Patient/Family Education, Dysphagia/Aspiration Precaution Training, Bladder Management, Medication Management, Discharge Planning, Bowel Management, Skin Care/Wound  Management, Disease Management/Prevention  PT interventions Ambulation/gait training, Disease management/prevention, Pain management, Stair training, Visual/perceptual remediation/compensation, Wheelchair propulsion/positioning, Therapeutic Activities, Patient/family education, Cognitive remediation/compensation, Functional electrical stimulation, DME/adaptive equipment instruction, Psychosocial support, Therapeutic Exercise, Training and development officer, Community reintegration, Functional mobility training, Skin care/wound management, UE/LE Strength taining/ROM, UE/LE Coordination activities, Splinting/orthotics, Discharge planning, Neuromuscular re-education  OT Interventions Balance/vestibular training, Discharge planning, Pain management, Self Care/advanced ADL retraining, Therapeutic Activities, UE/LE Coordination activities, Functional mobility training, Cognitive remediation/compensation, Patient/family education, Therapeutic Exercise, DME/adaptive equipment instruction, Neuromuscular re-education, Psychosocial support, Splinting/orthotics, UE/LE Strength taining/ROM  SLP Interventions Cognitive remediation/compensation, Cueing hierarchy, Dysphagia/aspiration precaution training, Functional tasks, Patient/family education  TR Interventions    SW/CM Interventions Discharge Planning, Psychosocial Support, Patient/Family Education   Barriers to Discharge MD  prior hx of R foot drop and debility  Nursing Medical stability    PT Behavior moves very cautiously and slowly   OT      SLP      SW       Team Discharge Planning: Destination: PT-Home ,OT- Home , SLP-Home Projected Follow-up: PT-Home health PT, OT-  Home health OT, 24 hour supervision/assistance, SLP-24 hour supervision/assistance, Home Health SLP Projected Equipment Needs: PT-To be determined, OT- To be determined, SLP-  Equipment Details: PT-Already has rollator, transfer chair, OT-Pt has elevated toilets and shower  chair Patient/family involved in discharge planning: PT- Patient, Family Midwife,  OT-Patient, Family member/caregiver, SLP-Patient, Family member/caregiver  MD ELOS: 7-10d Medical Rehab Prognosis:  Fair Assessment:  84 y.o.femalewith history of CAD, h/o PE/DVT (after back surgery), RLE weakness, vertigo, PAF--no anticoagulation due epidural hematoma,  HTN who was admitted on  12/07/17 with left sided weakness, rightward gaze, difficulty speaking and inability to walk. History taken from chart review and daughter. CTA/P done revealing abnormal perfusion right frontal operculum with severe stenosis of right MCA mid M2 brach, no emergent large vessel infarct and aortic atherosclerosis. She was treated with tPA. 2 D echo with EF 55-60% with no wall abnormality and moderately calcified AV and calcified mitral annulus. She has had issues with hypotension past admission requiring fluid boluses. MRI brain 12/27 reviewed, negative for acute process. Neurology felt that episode due to aborted stroke with R M2 stenosis. Patient with R-MCA embolic stroke not seen on MRI.  TEE with loop recorder to be scheduled on outpatient basis. ASA changed to Plavix due to intracranial stenosis. She was placed on dysphagia 2, nectar liquids due to mild to moderate sensory oropharyngeal dysphagia but has had poor fluid intake with thick secretions. Was advanced to thin liquids-- with Provale cup and chin tuck to prevent aspiration    Now requiring 24/7 Rehab RN,MD, as well as CIR level PT, OT and SLP.  Treatment team will focus on ADLs and mobility with goals set at Sup  See Team Conference Notes for weekly updates to the plan of care

## 2017-12-14 NOTE — Progress Notes (Signed)
Social Work Patient ID: Molly [name], female   DOB: [birth year], 84 y.o.   MRN: [id number]  Pt and daughter-Molly [name] aware of team conference goals supervision over-all and target discharge date 1/8. Both pleased with the plan and feel she is doing well here but still has a ways to go. Work toward 1/8.

## 2017-12-14 NOTE — Progress Notes (Signed)
Subjective/Complaints:  No issues overnite ROS:  No CP, SOB, +nocturia last noc Objective: Vital Signs: Blood pressure (!) 139/59, pulse 64, temperature 98.7 F (37.1 C), temperature source Oral, resp. rate 18, height 5' 2"  (1.575 m), weight 73.7 kg (162 lb 7.7 oz), SpO2 97 %. No results found. Results for orders placed or performed during the hospital encounter of 12/12/17 (from the past 72 hour(s))  CBC WITH DIFFERENTIAL     Status: Abnormal   Collection Time: 12/13/17  6:29 AM  Result Value Ref Range   WBC 4.6 4.0 - 10.5 K/uL   RBC 3.45 (L) 3.87 - 5.11 MIL/uL   Hemoglobin 11.0 (L) 12.0 - 15.0 g/dL   HCT 33.0 (L) 36.0 - 46.0 %   MCV 95.7 78.0 - 100.0 fL   MCH 31.9 26.0 - 34.0 pg   MCHC 33.3 30.0 - 36.0 g/dL   RDW 13.3 11.5 - 15.5 %   Platelets 163 150 - 400 K/uL   Neutrophils Relative % 55 %   Neutro Abs 2.6 1.7 - 7.7 K/uL   Lymphocytes Relative 33 %   Lymphs Abs 1.5 0.7 - 4.0 K/uL   Monocytes Relative 8 %   Monocytes Absolute 0.4 0.1 - 1.0 K/uL   Eosinophils Relative 4 %   Eosinophils Absolute 0.2 0.0 - 0.7 K/uL   Basophils Relative 0 %   Basophils Absolute 0.0 0.0 - 0.1 K/uL  Comprehensive metabolic panel     Status: Abnormal   Collection Time: 12/13/17  6:29 AM  Result Value Ref Range   Sodium 139 135 - 145 mmol/L   Potassium 3.9 3.5 - 5.1 mmol/L   Chloride 105 101 - 111 mmol/L   CO2 25 22 - 32 mmol/L   Glucose, Bld 101 (H) 65 - 99 mg/dL   BUN 13 6 - 20 mg/dL   Creatinine, Ser 0.63 0.44 - 1.00 mg/dL   Calcium 9.6 8.9 - 10.3 mg/dL   Total Protein 5.4 (L) 6.5 - 8.1 g/dL   Albumin 3.0 (L) 3.5 - 5.0 g/dL   AST 29 15 - 41 U/L   ALT 22 14 - 54 U/L   Alkaline Phosphatase 30 (L) 38 - 126 U/L   Total Bilirubin 0.8 0.3 - 1.2 mg/dL   GFR calc non Af Amer >60 >60 mL/min   GFR calc Af Amer >60 >60 mL/min    Comment: (NOTE) The eGFR has been calculated using the CKD EPI equation. This calculation has not been validated in all clinical situations. eGFR's persistently <60  mL/min signify possible Chronic Kidney Disease.    Anion gap 9 5 - 15     HEENT: mild left facial droop Cardio: RRR and no murmur Resp: CTA B/L and unlabored GI: BS positive and NT, ND Extremity:  No Edema Skin:   Other IV site CDI left forearm, ecchymosis B forearms Neuro: Alert/Oriented, Cranial Nerve Abnormalities Left central VII, Abnormal Motor 4+/5 in BUE and BLE except 3+ R ankle DF and Dysarthric Musc/Skel:  Other no pain with UE or LE AROM Gen NAD   Assessment/Plan: 1. Functional deficits secondary to Right MCA infarct with worsened gait and dysarthria and dysphagia, ADL status has declined as well which require 3+ hours per day of interdisciplinary therapy in a comprehensive inpatient rehab setting. Physiatrist is providing close team supervision and 24 hour management of active medical problems listed below. Physiatrist and rehab team continue to assess barriers to discharge/monitor patient progress toward functional and medical goals. FIM: Function - Bathing  Bathing activity did not occur: Refused  Function- Upper Body Dressing/Undressing What is the patient wearing?: Bra, Pull over shirt/dress Bra - Perfomed by patient: Thread/unthread right bra strap, Thread/unthread left bra strap Bra - Perfomed by helper: Hook/unhook bra (pull down sports bra) Pull over shirt/dress - Perfomed by patient: Put head through opening Pull over shirt/dress - Perfomed by helper: Thread/unthread right sleeve, Thread/unthread left sleeve, Pull shirt over trunk Function - Lower Body Dressing/Undressing What is the patient wearing?: Pants, Non-skid slipper socks, Ted Hose Position: Other (comment)(Sitting on toilet) Pants- Performed by helper: Thread/unthread right pants leg, Thread/unthread left pants leg, Pull pants up/down Non-skid slipper socks- Performed by helper: Don/doff right sock, Don/doff left sock TED Hose - Performed by helper: Don/doff right TED hose, Don/doff left TED  hose Assist for footwear: Maximal assist  Function - Toileting Toileting activity did not occur: No continent bowel/bladder event Toileting steps completed by helper: Adjust clothing prior to toileting, Performs perineal hygiene, Adjust clothing after toileting Toileting Assistive Devices: Grab bar or rail  Function - Air cabin crew transfer assistive device: Elevated toilet seat/BSC over toilet, Grab bar Assist level to toilet: Moderate assist (Pt 50 - 74%/lift or lower) Assist level from toilet: Moderate assist (Pt 50 - 74%/lift or lower)  Function - Chair/bed transfer Chair/bed transfer method: Squat pivot Chair/bed transfer assist level: Moderate assist (Pt 50 - 74%/lift or lower) Chair/bed transfer assistive device: Bedrails, Armrests Chair/bed transfer details: Tactile cues for sequencing, Tactile cues for initiation, Verbal cues for technique, Verbal cues for precautions/safety  Function - Locomotion: Wheelchair Will patient use wheelchair at discharge?: Yes Type: Manual Max wheelchair distance: 33' Assist Level: Supervision or verbal cues Wheel 50 feet with 2 turns activity did not occur: Safety/medical concerns Wheel 150 feet activity did not occur: Safety/medical concerns Turns around,maneuvers to table,bed, and toilet,negotiates 3% grade,maneuvers on rugs and over doorsills: No Function - Locomotion: Ambulation Assistive device: Walker-rolling Max distance: 35' Assist level: Touching or steadying assistance (Pt > 75%) Assist level: Touching or steadying assistance (Pt > 75%) Assist level: Touching or steadying assistance (Pt > 75%) Walk 150 feet activity did not occur: Safety/medical concerns Walk 10 feet on uneven surfaces activity did not occur: Safety/medical concerns  Function - Comprehension Comprehension: Auditory Comprehension assist level: Understands complex 90% of the time/cues 10% of the time  Function - Expression Expression: Verbal Expression  assist level: Expresses complex 90% of the time/cues < 10% of the time  Function - Social Interaction Social Interaction assist level: Interacts appropriately 90% of the time - Needs monitoring or encouragement for participation or interaction.  Function - Problem Solving Problem solving assist level: Solves complex 90% of the time/cues < 10% of the time  Function - Memory Memory assist level: Recognizes or recalls 90% of the time/requires cueing < 10% of the time Patient normally able to recall (first 3 days only): Current season, Location of own room, Staff names and faces, That he or she is in a hospital  Medical Problem List and Plan: 1.  Weakness, dysphagia, limitations with mobility and ADLs secondary to suspected R-MCA embolic stroke.Team conference today please see physician documentation under team conference tab, met with team face-to-face to discuss problems,progress, and goals. Formulized individual treatment plan based on medical history, underlying problem and comorbidities. 2.  DVT Prophylaxis/Anticoagulation: Pharmaceutical: Lovenox, PLT 163 3. Peripheral neuropathy/Pain Management: Resume bedtime tylenol. Will schedule home dose Neurontin to help with insomnia as well as BLE neuropathy (used prn at home).  4. Mood:  LCSW to follow for evaluation and support.  5. Neuropsych: This patient is not fully capable of making decisions on her own behalf. 6. Skin/Wound Care: routine pressure relief measures.  7. Fluids/Electrolytes/Nutrition: Monitor I/O. Encourage fluid intake--renal status WNL. Discontinue IVF to avoid overload.  8. HTN: Avoid hypotension to allow for adequate perfusion  Controlled 1/2 Vitals:   12/13/17 1450 12/14/17 0300  BP: (!) 113/54 (!) 139/59  Pulse: 64 64  Resp: 17 18  Temp: 98.2 F (36.8 C) 98.7 F (37.1 C)  SpO2: 99% 97%   9. CAD: On metoprolol, Crestor and Imdur.  10 PAF: To follow up with cardiology for TEE/loop recorder after discharge. On plavix  for CVA prophyllaxis 11. GERD: Resume PPI.  12. Spinal stenosis with right foot drop: Neurontin at bedtime prn neuropathy. She does not like to use her AFO.  13. ABLA: Repeat CBC stable Hgb 11 14. Constipation: Better per family--continue Senna 2  bid.  15. Hypokalemia: Resolved with supplement.KCL 3.9 on 1/1    LOS (Days) 2 A FACE TO FACE EVALUATION WAS PERFORMED  Charlett Blake 12/14/2017, 8:48 AM

## 2017-12-14 NOTE — Progress Notes (Signed)
Speech Language Pathology Daily Session Note  Patient Details  Name: Molly [name] MRN: [id number] Date of Birth: 29-Sep-1929  Today's Date: 12/14/2017 SLP Individual Time: 0800-0900 SLP Individual Time Calculation (min): 60 min  Short Term Goals: Week 1: SLP Short Term Goal 1 (Week 1): Pt will demonstrate efficient mastication and oral clearnce of dys 3 textured trials with min s/s aspriation and Mod I use of swallow startegies. SLP Short Term Goal 2 (Week 1): Pt will tolerate thin liquid trials via cup with min s/s aspriation and Mod I use of swallow strategies. SLP Short Term Goal 3 (Week 1): Pt will increase vocal intensity at the phrase level to achieve 90% intelligibility with Min A verbal cues.  SLP Short Term Goal 4 (Week 1): Pt will demonstrate verbal/phsyical response within 3 seconds during functional tasks given Min A verbal/visual cues.  SLP Short Term Goal 5 (Week 1): Pt demonstrate functional problem solving for mildly complex tasks with supervision A verbal cues SLP Short Term Goal 6 (Week 1): Pt will utilize word finding strategies during divergent naming tasks with Mod a verbal cues.   Skilled Therapeutic Interventions: Skilled ST services focused on swallow and cognitive skills. SLP facilitated PO consumption trials of thin liquid via cup, pt demonstrated Mod s/saspiartion with immediate coughs, however given provale cup no overt s/s aspiration and min anterior spillage of liquid overall. SLP instructed pt in salvia management to initiate volitional swallow instead of wiping mouth, however pt demonstrated Mod I initiation of volitional swallow and no anterior spillage, much improved from yesterday's session. SLP instructed pt in speech intelligibility strategies to focus on increasing vocal intensity with coordination of phonation and respiration requiring Max-Mod A verbal cues to utilize strategies at phrase/senetnce level. SLP facilitated problem solving task with 4 step  sequence cards, pt required Min A verbal cues for problem solving, however required extended time to complete task. SLP facilitated word finding strategies in naming opposites with Min A verbal cues and simple divergent tasks with Min-Mod A verbal cues. Pt was left in room with call ball within reach.Reccomend to continue skilled ST services.     Function:  Eating Eating   Modified Consistency Diet: No(thin cup and provale cup) Eating Assist Level: Supervision or verbal cues           Cognition Comprehension Comprehension assist level: Understands complex 90% of the time/cues 10% of the time  Expression   Expression assist level: Expresses basic 75 - 89% of the time/requires cueing 10 - 24% of the time. Needs helper to occlude trach/needs to repeat words.  Social Interaction Social Interaction assist level: Interacts appropriately 90% of the time - Needs monitoring or encouragement for participation or interaction.  Problem Solving Problem solving assist level: Solves basic 90% of the time/requires cueing < 10% of the time  Memory Memory assist level: Recognizes or recalls 75 - 89% of the time/requires cueing 10 - 24% of the time    Pain Pain Assessment Pain Assessment: No/denies pain  Therapy/Group: Individual Therapy  [name]  [facility] 12/14/2017, 12:40 PM

## 2017-12-15 ENCOUNTER — Inpatient Hospital Stay (HOSPITAL_COMMUNITY): Payer: Medicare Other | Admitting: *Deleted

## 2017-12-15 ENCOUNTER — Inpatient Hospital Stay (HOSPITAL_COMMUNITY): Payer: Medicare Other | Admitting: Physical Therapy

## 2017-12-15 ENCOUNTER — Inpatient Hospital Stay (HOSPITAL_COMMUNITY): Payer: Medicare Other | Admitting: Occupational Therapy

## 2017-12-15 ENCOUNTER — Inpatient Hospital Stay (HOSPITAL_COMMUNITY): Payer: Medicare Other | Admitting: Speech Pathology

## 2017-12-15 DIAGNOSIS — I951 Orthostatic hypotension: Secondary | ICD-10-CM

## 2017-12-15 DIAGNOSIS — I69398 Other sequelae of cerebral infarction: Secondary | ICD-10-CM

## 2017-12-15 DIAGNOSIS — R269 Unspecified abnormalities of gait and mobility: Secondary | ICD-10-CM

## 2017-12-15 MED ORDER — SODIUM CHLORIDE 0.45 % IV SOLN
INTRAVENOUS | Status: DC
  Administered 2017-12-15: 18:00:00 via INTRAVENOUS

## 2017-12-15 NOTE — Progress Notes (Signed)
Recreational Therapy Session Note  Patient Details  Name: Molly [name] MRN: [id number] Date of Birth: Oct 14, 1929 Today's Date: 12/15/2017   Met briefly with pt to discuss TR services, full eval deferred as pt is scheduled home on 12/20/17.  Discussed leisure interests and encouraged physical activity as tolerated at discharge.  Pt stated understanding but was anxious to return home with daughter.  No further TR. [name] 12/15/2017, 11:27 AM

## 2017-12-15 NOTE — Progress Notes (Signed)
Speech Language Pathology Daily Session Note  Patient Details  Name: Molly [name] MRN: [id number] Date of Birth: [birth year]  Today's Date: 12/15/2017 SLP Individual Time: 1305-1400 SLP Individual Time Calculation (min): 55 min  Short Term Goals: Week 1: SLP Short Term Goal 1 (Week 1): Pt will demonstrate efficient mastication and oral clearnce of dys 3 textured trials with min s/s aspriation and Mod I use of swallow startegies. SLP Short Term Goal 2 (Week 1): Pt will tolerate thin liquid trials via cup with min s/s aspriation and Mod I use of swallow strategies. SLP Short Term Goal 3 (Week 1): Pt will increase vocal intensity at the phrase level to achieve 90% intelligibility with Min A verbal cues.  SLP Short Term Goal 4 (Week 1): Pt will demonstrate verbal/phsyical response within 3 seconds during functional tasks given Min A verbal/visual cues.  SLP Short Term Goal 5 (Week 1): Pt demonstrate functional problem solving for mildly complex tasks with supervision A verbal cues SLP Short Term Goal 6 (Week 1): Pt will utilize word finding strategies during divergent naming tasks with Mod a verbal cues.   Skilled Therapeutic Interventions:  Pt was seen for skilled ST targeting dysphagia and cognition goals.  Pt with complaints of shoulder pain upon arrival.  RN already made aware and administered pain meds during therapy session.  Pt completed oral care with min assist and more than a reasonable amount of time to minimize bacterial load from oral secretions.  Vocal quality was clear and pt had adequate containment of her saliva until dentures were removed; at which point pt removed copious, clear, thick secretions from oral cavity.  With dentures replaced after cleaning, saliva management was once again adequate.   Pt could recall her swallowing precautions for 100% accuracy with mod I.  Pt also  consumed thin liquids via Provale and regular cup sips with mod I use of chin tuck in 100% of  opportunities with no overt s/s of aspiration.  Pt demonstrated s/s of good airway protection with head in neutral position as well.  The only time pt demonstrated coughing was when challenged to take large sips of liquids.  Pt has good safety awareness regarding her PO intake as evidenced by cautious pacing with trials.   For now, will recommend that pt remain on Provale cup to control bolus size for 1 additional ST session to assess for toleration of regular cup sips prior to upgrade.  Pt initiated and sequenced transfer back to bed in a timely manner at the end of today's therapy session with min assist verbal cues.  Pt left in bed with bed alarm set and call bell within reach.    Function:  Eating Eating   Modified Consistency Diet: Yes Eating Assist Level: More than reasonable amount of time           Cognition Comprehension Comprehension assist level: Follows basic conversation/direction with no assist  Expression   Expression assist level: Expresses basic 90% of the time/requires cueing < 10% of the time.  Social Interaction Social Interaction assist level: Interacts appropriately 90% of the time - Needs monitoring or encouragement for participation or interaction.  Problem Solving Problem solving assist level: Solves basic 90% of the time/requires cueing < 10% of the time  Memory Memory assist level: Recognizes or recalls 90% of the time/requires cueing < 10% of the time    Pain Pain Assessment Pain Assessment: No/denies pain  Therapy/Group: Individual Therapy  Molly [name], Molly [name] 12/15/2017, 3:40 PM

## 2017-12-15 NOTE — Progress Notes (Signed)
Occupational Therapy Session Note  Patient Details  Name: Molly [name] MRN: [id number] Date of Birth: Aug 30, 1929  Today's Date: 12/15/2017 OT Individual Time: 3428-7681 OT Individual Time Calculation (min): 70 min    Short Term Goals: Week 1:  OT Short Term Goal 1 (Week 1): Pt will complete toileting task with min A OT Short Term Goal 2 (Week 1): Pt will complete functional transfers with min A OT Short Term Goal 3 (Week 1): Pt will stand to complete 1 grooming task in order to increase functional activity tolerance OT Short Term Goal 4 (Week 1): Pt will maintain sitting balance EOB during functional task with supervision/VCs  Skilled Therapeutic Interventions/Progress Updates:    PT seen for OT ADL session. Pt sitting up in w/c upon arrival, reported to OT that her BP dropped during PT session. BP assessed seated in w/c, 89/44. RN made aware. Pt assymptomatic and RN gave clearance to cont to pt tolerance.  Grooming and dressing tasks completed from w/c level at sink, pt requiring significantly increased time for all movements and tasks. She was able to manage and wash dentures with assist for set-up of items, assist provided for thoroughness of washing toungue due to excessive thrush. She dressed seated in w/c, standing at sink to complete clothing management with CGA. She ambulated ~76ft into bathroom using RW with close supervision, noted with difficulty to clear R LE with each step, able to clear with VCs for awareness to foot drag. Completed toileting task with steadyding assist, able to stand from [facility] over toilet with supervison and increased time. She tolerated standing at sink to complete hand hygiene, however, then voiced need for seated rest break with complaints of nausea. BP assessed in sitting, 117/64. Pt provided with cold wash cloth and rest break.  Pt desired to stay sitting up in w/c at end of session, all needs in reach and personal care attendant present. .   Therapy  Documentation Precautions:  Precautions Precautions: Fall Other Brace/Splint: pt has an AFO at home for her right leg, but it is too heavy for her to use.  Restrictions Weight Bearing Restrictions: No Pain:   No/ denies pain  See Function Navigator for Current Functional Status.   Therapy/Group: Individual Therapy  Donnah Levert L 12/15/2017, 7:07 AM

## 2017-12-15 NOTE — Progress Notes (Signed)
Physical Therapy Session Note  Patient Details  Name: Molly [name] MRN: [id number] Date of Birth: Nov 18, 1929  Today's Date: 12/15/2017 PT Individual Time: 0800-0900 PT Individual Time Calculation (min): 60 min   Short Term Goals: Week 1:  PT Short Term Goal 1 (Week 1): Pt will transfer bed<>chair w/ min assist PT Short Term Goal 2 (Week 1): Pt will perform bed mobility w/ supervision PT Short Term Goal 3 (Week 1): Pt will tolerate 30 min of OOB/upright activity w/o increase in fatigue  Skilled Therapeutic Interventions/Progress Updates:   Pt received getting off toilet following use of restroom with NT, and agreeable to PT. Pt instructed in function gait through room to sink to wash hands while standing. Pt transported to rehab gym. Gait training with rollator x 152f with supervision assist from PT. PT instructed pt in dynamic gait training to weave through 5 cones x 4 with use of rollator and supervision assist from PT. Following dynamic gait traing, pt reports dizziness. BP assessed. Sitting, 103/62. Attempted to check standing,but pt unable to remain standing >10sec due othrostatic symptoms. PT applied ted hose. Re assess Othrostatic vitals. Sitting 111/59, standing 80/42, return sitting 113/57. Pt symptomatic in standing. [facility] and all needs met.  RN aware of Orthostasis.        Therapy Documentation Precautions:  Precautions Precautions: Fall Other Brace/Splint: pt has an AFO at home for her right leg, but it is too heavy for her to use.  Restrictions Weight Bearing Restrictions: No General:   Vital Signs: Therapy Vitals Patient Position (if appropriate): Orthostatic Vitals  Sitting111:59.  Standing:80/42 Pain: Pain Assessment Pain Assessment: No/denies pain   See Function Navigator for Current Functional Status.   Therapy/Group: Individual Therapy  ALorie Phenix1/02/2018, 9:23 AM

## 2017-12-15 NOTE — Progress Notes (Signed)
Subjective/Complaints: No issues overnite but had orthostatic episode while standing with PT,  Pt returned to room in [facility] with resolution of symptoms  ROS:  No CP, SOB, +nocturia last noc Objective: Vital Signs: Blood pressure 140/60, pulse 64, temperature 98.2 F (36.8 C), temperature source Oral, resp. rate 16, height 5' 2"  (1.575 m), weight 73.7 kg (162 lb 7.7 oz), SpO2 100 %. No results found. Results for orders placed or performed during the hospital encounter of 12/12/17 (from the past 72 hour(s))  CBC WITH DIFFERENTIAL     Status: Abnormal   Collection Time: 12/13/17  6:29 AM  Result Value Ref Range   WBC 4.6 4.0 - 10.5 K/uL   RBC 3.45 (L) 3.87 - 5.11 MIL/uL   Hemoglobin 11.0 (L) 12.0 - 15.0 g/dL   HCT 33.0 (L) 36.0 - 46.0 %   MCV 95.7 78.0 - 100.0 fL   MCH 31.9 26.0 - 34.0 pg   MCHC 33.3 30.0 - 36.0 g/dL   RDW 13.3 11.5 - 15.5 %   Platelets 163 150 - 400 K/uL   Neutrophils Relative % 55 %   Neutro Abs 2.6 1.7 - 7.7 K/uL   Lymphocytes Relative 33 %   Lymphs Abs 1.5 0.7 - 4.0 K/uL   Monocytes Relative 8 %   Monocytes Absolute 0.4 0.1 - 1.0 K/uL   Eosinophils Relative 4 %   Eosinophils Absolute 0.2 0.0 - 0.7 K/uL   Basophils Relative 0 %   Basophils Absolute 0.0 0.0 - 0.1 K/uL  Comprehensive metabolic panel     Status: Abnormal   Collection Time: 12/13/17  6:29 AM  Result Value Ref Range   Sodium 139 135 - 145 mmol/L   Potassium 3.9 3.5 - 5.1 mmol/L   Chloride 105 101 - 111 mmol/L   CO2 25 22 - 32 mmol/L   Glucose, Bld 101 (H) 65 - 99 mg/dL   BUN 13 6 - 20 mg/dL   Creatinine, Ser 0.63 0.44 - 1.00 mg/dL   Calcium 9.6 8.9 - 10.3 mg/dL   Total Protein 5.4 (L) 6.5 - 8.1 g/dL   Albumin 3.0 (L) 3.5 - 5.0 g/dL   AST 29 15 - 41 U/L   ALT 22 14 - 54 U/L   Alkaline Phosphatase 30 (L) 38 - 126 U/L   Total Bilirubin 0.8 0.3 - 1.2 mg/dL   GFR calc non Af Amer >60 >60 mL/min   GFR calc Af Amer >60 >60 mL/min    Comment: (NOTE) The eGFR has been calculated using the CKD EPI  equation. This calculation has not been validated in all clinical situations. eGFR's persistently <60 mL/min signify possible Chronic Kidney Disease.    Anion gap 9 5 - 15     HEENT: mild left facial droop Cardio: RRR and no murmur Resp: CTA B/L and unlabored GI: BS positive and NT, ND Extremity:  No Edema Skin:   Other IV site CDI left forearm, ecchymosis B forearms Neuro: Alert/Oriented, Cranial Nerve Abnormalities Left central VII, Abnormal Motor 4+/5 in BUE and BLE except 3+ R ankle DF and Dysarthric Musc/Skel:  Other no pain with UE or LE AROM Gen NAD   Assessment/Plan: 1. Functional deficits secondary to Right MCA infarct with worsened gait and dysarthria and dysphagia, ADL status has declined as well which require 3+ hours per day of interdisciplinary therapy in a comprehensive inpatient rehab setting. Physiatrist is providing close team supervision and 24 hour management of active medical problems listed below. Physiatrist and rehab  team continue to assess barriers to discharge/monitor patient progress toward functional and medical goals. FIM: Function - Bathing Bathing activity did not occur: Refused Position: Shower Body parts bathed by patient: Right arm, Left arm, Chest, Abdomen, Front perineal area, Buttocks, Right upper leg, Left upper leg Body parts bathed by helper: Right lower leg, Left lower leg, Back  Function- Upper Body Dressing/Undressing What is the patient wearing?: Bra, Pull over shirt/dress Bra - Perfomed by patient: Thread/unthread right bra strap, Thread/unthread left bra strap Bra - Perfomed by helper: Hook/unhook bra (pull down sports bra) Pull over shirt/dress - Perfomed by patient: Thread/unthread right sleeve, Thread/unthread left sleeve, Put head through opening Pull over shirt/dress - Perfomed by helper: Pull shirt over trunk Function - Lower Body Dressing/Undressing What is the patient wearing?: Pants, Socks, Shoes Position: Wheelchair/chair  at sink Pants- Performed by patient: Thread/unthread right pants leg, Thread/unthread left pants leg, Pull pants up/down Pants- Performed by helper: Thread/unthread right pants leg, Thread/unthread left pants leg, Pull pants up/down Non-skid slipper socks- Performed by helper: Don/doff right sock, Don/doff left sock Socks - Performed by helper: Don/doff right sock, Don/doff left sock Shoes - Performed by helper: Don/doff right shoe, Don/doff left shoe TED Hose - Performed by helper: Don/doff right TED hose, Don/doff left TED hose Assist for footwear: Maximal assist Assist for lower body dressing: Touching or steadying assistance (Pt > 75%)  Function - Toileting Toileting activity did not occur: No continent bowel/bladder event Toileting steps completed by helper: Adjust clothing prior to toileting, Performs perineal hygiene, Adjust clothing after toileting Toileting Assistive Devices: Grab bar or rail  Function - Air cabin crew transfer assistive device: Elevated toilet seat/BSC over toilet, Grab bar Assist level to toilet: Moderate assist (Pt 50 - 74%/lift or lower) Assist level from toilet: Moderate assist (Pt 50 - 74%/lift or lower)  Function - Chair/bed transfer Chair/bed transfer method: Stand pivot Chair/bed transfer assist level: Supervision or verbal cues Chair/bed transfer assistive device: Armrests, Walker Chair/bed transfer details: Tactile cues for sequencing, Tactile cues for initiation, Verbal cues for technique, Verbal cues for precautions/safety  Function - Locomotion: Wheelchair Will patient use wheelchair at discharge?: Yes Type: Manual Max wheelchair distance: 40' Assist Level: Supervision or verbal cues Wheel 50 feet with 2 turns activity did not occur: Safety/medical concerns Wheel 150 feet activity did not occur: Safety/medical concerns Turns around,maneuvers to table,bed, and toilet,negotiates 3% grade,maneuvers on rugs and over doorsills: No Function  - Locomotion: Ambulation Assistive device: Walker-rolling Max distance: 194f Assist level: Supervision or verbal cues Assist level: Supervision or verbal cues Assist level: Supervision or verbal cues Walk 150 feet activity did not occur: Safety/medical concerns Assist level: Supervision or verbal cues Walk 10 feet on uneven surfaces activity did not occur: Safety/medical concerns Assist level: Touching or steadying assistance (Pt > 75%)  Function - Comprehension Comprehension: Auditory Comprehension assist level: Understands complex 90% of the time/cues 10% of the time  Function - Expression Expression: Verbal Expression assist level: Expresses basic 75 - 89% of the time/requires cueing 10 - 24% of the time. Needs helper to occlude trach/needs to repeat words.  Function - Social Interaction Social Interaction assist level: Interacts appropriately 90% of the time - Needs monitoring or encouragement for participation or interaction.  Function - Problem Solving Problem solving assist level: Solves basic 90% of the time/requires cueing < 10% of the time  Function - Memory Memory assist level: Recognizes or recalls 75 - 89% of the time/requires cueing 10 - 24% of the  time Patient normally able to recall (first 3 days only): Current season, Location of own room, Staff names and faces, That he or she is in a hospital  Medical Problem List and Plan: 1.  Weakness, dysphagia, limitations with mobility and ADLs secondary to suspected R-MCA embolic stroke.Cont CIR PT, OT, SLP2.  DVT Prophylaxis/Anticoagulation: Pharmaceutical: Lovenox, PLT 163 3. Peripheral neuropathy/Pain Management: Resume bedtime tylenol. Will schedule home dose Neurontin to help with insomnia as well as BLE neuropathy (used prn at home).  4. Mood: LCSW to follow for evaluation and support.  5. Neuropsych: This patient is not fully capable of making decisions on her own behalf. 6. Skin/Wound Care: routine pressure relief  measures.  7. Fluids/Electrolytes/Nutrition: Monitor I/O. Encourage fluid intake--renal status WNL. Discontinue IVF to avoid overload.  8. HTN: Avoid hypotension to allow for adequate perfusion  Controlled 1/3 but has orthostatic drops with standing of >70mHg Vitals:   12/14/17 1453 12/15/17 0204  BP: 129/65 140/60  Pulse: 67 64  Resp: 18 16  Temp: 97.9 F (36.6 C) 98.2 F (36.8 C)  SpO2: 100% 100%   9. CAD: On metoprolol, Crestor and Imdur.  10 PAF: To follow up with cardiology for TEE/loop recorder after discharge. On plavix for CVA prophyllaxis 11. GERD: Resume PPI.  12. Spinal stenosis with right foot drop: Neurontin at bedtime prn neuropathy. She does not like to use her AFO.  13. ABLA: Repeat CBC stable Hgb 11 14. Constipation: Better per family--continue Senna 2  bid.  15. Hypokalemia: Resolved with supplement.KCL 3.9 on 1/1 16.  Orthostatic hypotension likely volume related, very poor oral intake of fluid, IV .45NS 722mhr 7p-7a   LOS (Days) 3 A FACE TO FACE EVALUATION WAS PERFORMED  AnCharlett Blake/02/2018, 10:19 AM

## 2017-12-16 ENCOUNTER — Inpatient Hospital Stay (HOSPITAL_COMMUNITY): Payer: Medicare Other | Admitting: Speech Pathology

## 2017-12-16 ENCOUNTER — Inpatient Hospital Stay (HOSPITAL_COMMUNITY): Payer: Medicare Other

## 2017-12-16 ENCOUNTER — Inpatient Hospital Stay (HOSPITAL_COMMUNITY): Payer: Medicare Other | Admitting: Physical Therapy

## 2017-12-16 MED ORDER — SODIUM CHLORIDE 0.45 % IV SOLN
INTRAVENOUS | Status: DC
  Administered 2017-12-16 – 2017-12-18 (×3): via INTRAVENOUS
  Filled 2017-12-16 (×3): qty 1000

## 2017-12-16 NOTE — Progress Notes (Signed)
Orthopedic Tech Progress Note Patient Details:  Molly [name] 07-Aug-1929 [id number]  Ortho Devices Type of Ortho Device: Abdominal binder Ortho Device/Splint Location: Dropped off Abdominal binder with floor nurse at bedside.  Size medium.       Kristopher Oppenheim 12/16/2017, 12:53 PM

## 2017-12-16 NOTE — Progress Notes (Signed)
Occupational Therapy Session Note  Patient Details  Name: Molly [name] MRN: [id number] Date of Birth: 1929/11/21  Today's Date: 12/16/2017 OT Individual Time: 1300-1415 OT Individual Time Calculation (min): 75 min    Short Term Goals: Week 1:  OT Short Term Goal 1 (Week 1): Pt will complete toileting task with min A OT Short Term Goal 2 (Week 1): Pt will complete functional transfers with min A OT Short Term Goal 3 (Week 1): Pt will stand to complete 1 grooming task in order to increase functional activity tolerance OT Short Term Goal 4 (Week 1): Pt will maintain sitting balance EOB during functional task with supervision/VCs  Skilled Therapeutic Interventions/Progress Updates:    1;1. Pt with no c/o pain. Daughter present throughout session. Pt eating/drinking with daughter and educated pt on using proval cup to decrease risk of aspiration per SLP sheet as pt is drinking coffee from stirofoam cup. Pt continues to eat magic cup with supervision with VC for chin tuck. Pt requesting to shower. Pt stand pivot transfer with touching A w/c>TTB to bathe at sit to stand level with A to wash buttocks and B feet. Pt reporting need to void bladder and walks with RW and min A to BSC over toilet. Pt stands with min A for peri care and walks out into room with RW to dress at w/c level at sink. Pt dons pull over shirt with supervision and VC for threading hand through sleeves. Daughter provides total A to don pants as OT steadies pt for balance as this is what daughter assists with at home. Exited session with pt seated in w/c with call light in reach and daughter present.   Therapy Documentation Precautions:  Precautions Precautions: Fall Other Brace/Splint: pt has an AFO at home for her right leg, but it is too heavy for her to use.  Restrictions Weight Bearing Restrictions: No General:   Vital Signs:  Pain: Pain Assessment Pain Assessment: 0-10 Pain Score: 4  Pain Location: Back Pain  Orientation: Upper Pain Descriptors / Indicators: Aching Pain Frequency: Intermittent Pain Onset: On-going Patients Stated Pain Goal: 2 Pain Intervention(s): Medication (See eMAR) Multiple Pain Sites: No ADL:   Vision   Perception    Praxis   Exercises:   Other Treatments:    See Function Navigator for Current Functional Status.   Therapy/Group: Individual Therapy  Tonny Branch 12/16/2017, 2:11 PM

## 2017-12-16 NOTE — Progress Notes (Signed)
Speech Language Pathology Daily Session Note  Patient Details  Name: Nicci Vaughan MRN: [id number] Date of Birth: [birth year]  Today's Date: 12/16/2017 SLP Individual Time: 1100-1200 SLP Individual Time Calculation (min): 60 min  Short Term Goals: Week 1: SLP Short Term Goal 1 (Week 1): Pt will demonstrate efficient mastication and oral clearnce of dys 3 textured trials with min s/s aspriation and Mod I use of swallow startegies. SLP Short Term Goal 2 (Week 1): Pt will tolerate thin liquid trials via cup with min s/s aspriation and Mod I use of swallow strategies. SLP Short Term Goal 3 (Week 1): Pt will increase vocal intensity at the phrase level to achieve 90% intelligibility with Min A verbal cues.  SLP Short Term Goal 4 (Week 1): Pt will demonstrate verbal/phsyical response within 3 seconds during functional tasks given Min A verbal/visual cues.  SLP Short Term Goal 5 (Week 1): Pt demonstrate functional problem solving for mildly complex tasks with supervision A verbal cues SLP Short Term Goal 6 (Week 1): Pt will utilize word finding strategies during divergent naming tasks with Mod a verbal cues.   Skilled Therapeutic Interventions: Skilled treatment session focused on dysphagia and cognition goals as well as education regarding swallow function, compensatory swallow strategies and implications for PO items at home. Pt consumed trials of thin liquids via cup sips with Mod I use of chin tuck. Pt free of overt s/s of aspiration via small cups sips (standard cup). Pt with cough at 1 during 5 trials of small sips without use of chin tuck. Pt appears with good progress as indicated by decreased s/s without chin tuck. SLP provided skilled observation of pt consuming graham crackers with good oral clearing and no oral phase deficits. Recommend observation of full tray of dysphagia 3 before upgrading diet. Extensive education provided on rationale for chin tuck, overt s/s of aspiration and Provale  cup. She voiced good understanding. Given intermittent supervision cues, pt able to complete basic problem solving centered about fixing her coffee/snack. During transfer to and from wheelchair,pt required Min A verbal and visual cues for successful transfer. Pt was returned to room left upright with daughter present.      Function:  Eating Eating   Modified Consistency Diet: No Eating Assist Level: More than reasonable amount of time           Cognition Comprehension Comprehension assist level: Follows basic conversation/direction with no assist  Expression   Expression assist level: Expresses basic needs/ideas: With extra time/assistive device  Social Interaction Social Interaction assist level: Interacts appropriately 90% of the time - Needs monitoring or encouragement for participation or interaction.  Problem Solving Problem solving assist level: Solves basic 90% of the time/requires cueing < 10% of the time  Memory Memory assist level: Recognizes or recalls 90% of the time/requires cueing < 10% of the time    Pain Pain Assessment Pain Assessment: No/denies pain  Therapy/Group: Individual Therapy  Bernard Donahoo 12/16/2017, 12:31 PM

## 2017-12-16 NOTE — Progress Notes (Signed)
Physical Therapy Session Note  Patient Details  Name: Molly [name] MRN: [id number] Date of Birth: 11/08/1929  Today's Date: 12/16/2017 PT Individual Time: [id number]   70 min   Short Term Goals: Week 1:  PT Short Term Goal 1 (Week 1): Pt will transfer bed<>chair w/ min assist PT Short Term Goal 2 (Week 1): Pt will perform bed mobility w/ supervision PT Short Term Goal 3 (Week 1): Pt will tolerate 30 min of OOB/upright activity w/o increase in fatigue  Skilled Therapeutic Interventions/Progress Updates:   Pt received sitting in WC and agreeable to PT. Pt reports receiving IV fluids throughout the night and Donning Ted hose prior to therapy.    PT assessed Orthostatic vitals.  Sitting113/56. On first attempt, pt noted to have increased palor, and requested to return to sitting. BP checked in sitting 117/57. Standing BP: 85/46. Gait x 57f to attempt to increase BP with physical exertion. re-assessed BP in standing following short distance of gait: 82/41. Return to sitting 103/48 - RN made aware of orthostatic vitals.   PT instructed pt in seated therex due to continued orthostasis.  Chest press with ball tap and 3lb bar weight 12 x 3.  Shoulder press/chest press with 3lb bar weight  x 10  Overhead ball toss. X 15 HS curls  x 12 LAQ  x12 Ankle PF. 2x15  Hip abduction x12  Reciprocal marches  2x10   Isometric hip adduction.  x12 with 3 sec hold.    BLE therex completed with level 2 tband except hip adduction with small therapy ball. Moderate cues throughout therex for proper ROM, decreased compensation through trunk, and proper speed to improve strengthening aspect of exercises.   [facility] and all needs met.          Therapy Documentation Precautions:  Precautions Precautions: Fall Other Brace/Splint: pt has an AFO at home for her right leg, but it is too heavy for her to use.  Restrictions Weight Bearing  Restrictions: No Vital Signs: Therapy Vitals Pulse Rate: 75 Pain:   0 /10  See Function Navigator for Current Functional Status.   Therapy/Group: Individual Therapy  ALorie Phenix1/03/2018, 9:40 AM

## 2017-12-17 ENCOUNTER — Inpatient Hospital Stay (HOSPITAL_COMMUNITY): Payer: Medicare Other

## 2017-12-17 ENCOUNTER — Inpatient Hospital Stay (HOSPITAL_COMMUNITY): Payer: Medicare Other | Admitting: Physical Therapy

## 2017-12-17 ENCOUNTER — Inpatient Hospital Stay (HOSPITAL_COMMUNITY): Payer: Medicare Other | Admitting: Speech Pathology

## 2017-12-17 MED ORDER — ISOSORBIDE MONONITRATE ER 30 MG PO TB24
30.0000 mg | ORAL_TABLET | Freq: Two times a day (BID) | ORAL | Status: DC
  Administered 2017-12-17: 30 mg via ORAL
  Filled 2017-12-17 (×2): qty 1

## 2017-12-17 MED ORDER — ENOXAPARIN SODIUM 40 MG/0.4ML ~~LOC~~ SOLN
40.0000 mg | SUBCUTANEOUS | Status: DC
  Administered 2017-12-17 – 2017-12-20 (×4): 40 mg via SUBCUTANEOUS
  Filled 2017-12-17 (×4): qty 0.4

## 2017-12-17 MED ORDER — METOPROLOL SUCCINATE ER 25 MG PO TB24
25.0000 mg | ORAL_TABLET | Freq: Every day | ORAL | Status: DC
  Administered 2017-12-18 – 2017-12-19 (×2): 25 mg via ORAL
  Filled 2017-12-17 (×2): qty 1

## 2017-12-17 NOTE — Progress Notes (Signed)
Occupational Therapy Session Note  Patient Details  Name: Molly [name] MRN: [id number] Date of Birth: 1929-05-21  Today's Date: 12/17/2017 OT Individual Time: 0900-1015 OT Individual Time Calculation (min): 75 min    Short Term Goals: Week 1:  OT Short Term Goal 1 (Week 1): Pt will complete toileting task with min A OT Short Term Goal 2 (Week 1): Pt will complete functional transfers with min A OT Short Term Goal 3 (Week 1): Pt will stand to complete 1 grooming task in order to increase functional activity tolerance OT Short Term Goal 4 (Week 1): Pt will maintain sitting balance EOB during functional task with supervision/VCs  Skilled Therapeutic Interventions/Progress Updates:    1:1. Pt and caregiver present throughout session. Pt with no c/o pain. Pt supine>sitting EOB with MOD A for RLE management and trunk elevation. Pt sits EOB to change clothes donning bra with A to fasten in back. Pt doffs/dons pull over shirt supervision. Pt dons pants with supervision and VC for pulling pants fully past buttocks. OT dons teds, shoes and abdominal binder. OT selects wider w/c for patient as pt c/o pain in back from bards in back of w/c pressing into sides. Pt stand pivot transfer with min A rolling walker EOB>w/c<>toilet. For time management, OT completes clothing management while providing CGA for balance. Pt voids bladder on BSC. Exited session with pt seated in w/c with call light in reach and all needs met.   Therapy Documentation Precautions:  Precautions Precautions: Fall Other Brace/Splint: pt has an AFO at home for her right leg, but it is too heavy for her to use.  Restrictions Weight Bearing Restrictions: No General:    See Function Navigator for Current Functional Status.   Therapy/Group: Individual Therapy  Tonny Branch 12/17/2017, 12:24 PM

## 2017-12-17 NOTE — Progress Notes (Signed)
Speech Language Pathology Daily Session Note  Patient Details  Name: Molly [name] MRN: [id number] Date of Birth: 1929/01/09  Today's Date: 12/17/2017 SLP Individual Time: 0830-0900 SLP Individual Time Calculation (min): 30 min  Short Term Goals: Week 1: SLP Short Term Goal 1 (Week 1): Pt will demonstrate efficient mastication and oral clearnce of dys 3 textured trials with min s/s aspriation and Mod I use of swallow startegies. SLP Short Term Goal 2 (Week 1): Pt will tolerate thin liquid trials via cup with min s/s aspriation and Mod I use of swallow strategies. SLP Short Term Goal 3 (Week 1): Pt will increase vocal intensity at the phrase level to achieve 90% intelligibility with Min A verbal cues.  SLP Short Term Goal 4 (Week 1): Pt will demonstrate verbal/phsyical response within 3 seconds during functional tasks given Min A verbal/visual cues.  SLP Short Term Goal 5 (Week 1): Pt demonstrate functional problem solving for mildly complex tasks with supervision A verbal cues SLP Short Term Goal 6 (Week 1): Pt will utilize word finding strategies during divergent naming tasks with Mod a verbal cues.   Skilled Therapeutic Interventions: Skilled treatment session focused on dysphagia goals. Upon arrival, patient was consuming her breakfast meal of Dys. 2 textures with thin liquids. Patient consumed meal without overt s/s of aspiration and required Mod verbal cues for use of provale cup and chin tuck with solids. Patient reported she could not eat the french toast because the textures was "too tough" but that th oatmeal was going down fine. Recommend for patient to continue current diet with full supervision for use of swallowing compensatory strategies. Patient left with caregiver completing meal.      Function:  Eating Eating   Modified Consistency Diet: Yes Eating Assist Level: More than reasonable amount of time;Supervision or verbal cues   Eating Set Up Assist For: Opening  containers;Cutting food;Applying device (includes dentures)       Cognition Comprehension Comprehension assist level: Follows basic conversation/direction with no assist  Expression   Expression assist level: Expresses basic needs/ideas: With extra time/assistive device  Social Interaction Social Interaction assist level: Interacts appropriately 90% of the time - Needs monitoring or encouragement for participation or interaction.  Problem Solving Problem solving assist level: Solves basic 90% of the time/requires cueing < 10% of the time  Memory Memory assist level: Recognizes or recalls 90% of the time/requires cueing < 10% of the time    Pain No/Denies Pain   Therapy/Group: Individual Therapy  Yosiah Jasmin 12/17/2017, 12:45 PM

## 2017-12-17 NOTE — Progress Notes (Signed)
Subjective/Complaints:   ROS:  No CP, SOB, +nocturia last noc Objective: Vital Signs: Blood pressure (!) 155/63, pulse (!) 57, temperature 97.8 F (36.6 C), temperature source Oral, resp. rate 16, height 5\' 2"  (1.575 m), weight 73.7 kg (162 lb 7.7 oz), SpO2 99 %. No results found. No results found for this or any previous visit (from the past 72 hour(s)).   HEENT: mild left facial droop Cardio: RRR and no murmur Resp: CTA B/L and unlabored GI: BS positive and NT, ND Extremity:  No Edema Skin:   Other IV site CDI left forearm, ecchymosis B forearms Neuro: Alert/Oriented, Cranial Nerve Abnormalities Left central VII, Abnormal Motor 4+/5 in BUE and BLE except 3+ R ankle DF and Dysarthric Musc/Skel:  Other no pain with UE or LE AROM Gen NAD   Assessment/Plan: 1. Functional deficits secondary to Right MCA infarct with worsened gait and dysarthria and dysphagia, ADL status has declined as well which require 3+ hours per day of interdisciplinary therapy in a comprehensive inpatient rehab setting. Physiatrist is providing close team supervision and 24 hour management of active medical problems listed below. Physiatrist and rehab team continue to assess barriers to discharge/monitor patient progress toward functional and medical goals. FIM: Function - Bathing Bathing activity did not occur: Refused Position: Shower Body parts bathed by patient: Right arm, Left arm, Chest, Abdomen, Front perineal area, Buttocks, Right upper leg, Left upper leg Body parts bathed by helper: Right lower leg, Left lower leg, Back Assist Level: Touching or steadying assistance(Pt > 75%)  Function- Upper Body Dressing/Undressing What is the patient wearing?: Bra, Pull over shirt/dress Bra - Perfomed by patient: Thread/unthread right bra strap, Thread/unthread left bra strap Bra - Perfomed by helper: Hook/unhook bra (pull down sports bra) Pull over shirt/dress - Perfomed by patient: Thread/unthread right  sleeve, Thread/unthread left sleeve, Put head through opening, Pull shirt over trunk Pull over shirt/dress - Perfomed by helper: Pull shirt over trunk Assist Level: Touching or steadying assistance(Pt > 75%) Function - Lower Body Dressing/Undressing What is the patient wearing?: Pants, Shoes Position: Wheelchair/chair at sink Pants- Performed by patient: Thread/unthread right pants leg, Thread/unthread left pants leg, Pull pants up/down Pants- Performed by helper: Thread/unthread right pants leg, Thread/unthread left pants leg, Pull pants up/down Non-skid slipper socks- Performed by helper: Don/doff right sock, Don/doff left sock Socks - Performed by helper: Don/doff right sock, Don/doff left sock Shoes - Performed by helper: Don/doff right shoe, Don/doff left shoe TED Hose - Performed by helper: Don/doff right TED hose, Don/doff left TED hose Assist for footwear: Partial/moderate assist Assist for lower body dressing: Touching or steadying assistance (Pt > 75%)  Function - Toileting Toileting activity did not occur: No continent bowel/bladder event Toileting steps completed by patient: Adjust clothing prior to toileting, Performs perineal hygiene, Adjust clothing after toileting Toileting steps completed by helper: Performs perineal hygiene Toileting Assistive Devices: Grab bar or rail Assist level: Touching or steadying assistance (Pt.75%)  Function - Air cabin crew transfer assistive device: Elevated toilet seat/BSC over toilet, Grab bar Assist level to toilet: Touching or steadying assistance (Pt > 75%) Assist level from toilet: Touching or steadying assistance (Pt > 75%)  Function - Chair/bed transfer Chair/bed transfer method: Stand pivot Chair/bed transfer assist level: Supervision or verbal cues Chair/bed transfer assistive device: Armrests, Walker Chair/bed transfer details: Tactile cues for sequencing, Tactile cues for initiation, Verbal cues for technique, Verbal  cues for precautions/safety  Function - Locomotion: Wheelchair Will patient use wheelchair at discharge?: Yes Type: Manual  Max wheelchair distance: 8' Assist Level: Supervision or verbal cues Wheel 50 feet with 2 turns activity did not occur: Safety/medical concerns Wheel 150 feet activity did not occur: Safety/medical concerns Turns around,maneuvers to table,bed, and toilet,negotiates 3% grade,maneuvers on rugs and over doorsills: No Function - Locomotion: Ambulation Assistive device: Walker-rolling Max distance: 173ft Assist level: Supervision or verbal cues Assist level: Supervision or verbal cues Assist level: Supervision or verbal cues Walk 150 feet activity did not occur: Safety/medical concerns Assist level: Supervision or verbal cues Walk 10 feet on uneven surfaces activity did not occur: Safety/medical concerns Assist level: Touching or steadying assistance (Pt > 75%)  Function - Comprehension Comprehension: Auditory Comprehension assist level: Follows basic conversation/direction with no assist  Function - Expression Expression: Verbal Expression assist level: Expresses basic needs/ideas: With extra time/assistive device  Function - Social Interaction Social Interaction assist level: Interacts appropriately 90% of the time - Needs monitoring or encouragement for participation or interaction.  Function - Problem Solving Problem solving assist level: Solves basic 90% of the time/requires cueing < 10% of the time  Function - Memory Memory assist level: Recognizes or recalls 90% of the time/requires cueing < 10% of the time Patient normally able to recall (first 3 days only): Current season, Location of own room, Staff names and faces, That he or she is in a hospital  Medical Problem List and Plan: 1.  Weakness, dysphagia, limitations with mobility and ADLs secondary to suspected R-MCA embolic stroke.Cont CIR PT, OT, SLP2.  DVT Prophylaxis/Anticoagulation:  Pharmaceutical: Lovenox, PLT 163 3. Peripheral neuropathy/Pain Management: Resume bedtime tylenol. Will schedule home dose Neurontin to help with insomnia as well as BLE neuropathy (used prn at home).  4. Mood: LCSW to follow for evaluation and support.  5. Neuropsych: This patient is not fully capable of making decisions on her own behalf. 6. Skin/Wound Care: routine pressure relief measures.  7. Fluids/Electrolytes/Nutrition: Monitor I/O. Encourage fluid intake--renal status WNL. Po fluid ~452ml              8. HTN: Avoid hypotension to allow for adequate perfusion  1/3 orthostatic drops with standing of >23mmHg, no drop lying to sitting on 1/5 Vitals:   12/16/17 1449 12/17/17 0452  BP: 108/61 (!) 155/63  Pulse: 70 (!) 57  Resp: 18 16  Temp: (!) 97.4 F (36.3 C) 97.8 F (36.6 C)  SpO2: 100% 99%   9. CAD: On metoprolol, Crestor and Imdur.  10 PAF: To follow up with cardiology for TEE/loop recorder after discharge. On plavix for CVA prophyllaxis 11. GERD: Resume PPI.  12. Spinal stenosis with right foot drop: Neurontin at bedtime prn neuropathy. She does not like to use her AFO.  13. ABLA: Repeat CBC stable Hgb 11 14. Constipation: Better per family--continue Senna 2  bid.  15. Hypokalemia: Resolved with supplement.KCL 3.9 on 1/1 16.  Orthostatic hypotension likely volume related, very poor oral intake of fluid, IV .45NS 33ml/hr 7p-7a Improving, pt had problems with Low BP at home and took toprol at night with parameters on isosorbide dinitrate  LOS (Days) 5 A FACE TO FACE EVALUATION WAS PERFORMED  Charlett Blake 12/17/2017, 8:49 AM

## 2017-12-17 NOTE — Progress Notes (Signed)
Physical Therapy Session Note  Patient Details  Name: Molly [name] MRN: [id number] Date of Birth: [birth year]  Today's Date: 12/17/2017 PT Individual Time: 1105-1200   48mn  Short Term Goals: Week 1:  PT Short Term Goal 1 (Week 1): Pt will transfer bed<>chair w/ min assist PT Short Term Goal 2 (Week 1): Pt will perform bed mobility w/ supervision PT Short Term Goal 3 (Week 1): Pt will tolerate 30 min of OOB/upright activity w/o increase in fatigue  Skilled Therapeutic Interventions/Progress Updates:   Pt received sitting in WC and agreeable to PT. Pt noted to have abdominal binder and ted hose donned prior to therapy [facility] assessed by PT.  Sitting: 113/64. Standing 91/54, mildly symptomatic. Standing at 3 min and with abdominal binder tightened: 101/59  Pt continues to be symptomatic.    returned to sitting and reassed: 124/59. Pt allowed to rest until asymptomatic. Standing BP 88/55.   Squat pivot transfer to Nustep. Min assist from PT. nustep reciprocal gait movement and endurance training x 8 minutes, level 4. Min cues for increased ROM and increased speed to improve BP and cardiovascular demand of exercise.  BP assessed following nustep endurance exercises in sitting 139/68. Standing 99/57. Pt symptomatic for orthostasis.   RN aware of orthostatic BP.   [facility] and all needs met.     Therapy Documentation Precautions:  Precautions Precautions: Fall Other Brace/Splint: pt has an AFO at home for her right leg, but it is too heavy for her to use.  Restrictions Weight Bearing Restrictions: No Vital Signs: Therapy Vitals Temp: 97.8 F (36.6 C) Temp Source: Oral Pulse Rate: (!) 57 Resp: 16 BP: (!) 155/63 Patient Position (if appropriate): Lying Oxygen Therapy SpO2: 99 % O2 Device: Not Delivered Pain: 0/10   See Function Navigator for Current Functional  Status.   Therapy/Group: Individual Therapy  ALorie Phenix1/04/2018, 8:04 AM

## 2017-12-17 NOTE — Progress Notes (Signed)
Occupational Therapy Session Note  Patient Details  Name: Molly [name] MRN: [id number] Date of Birth: 04/06/1929  Today's Date: 12/17/2017 OT Individual Time: 1400-1430 OT Individual Time Calculation (min): 30 min    Short Term Goals: Week 1:  OT Short Term Goal 1 (Week 1): Pt will complete toileting task with min A OT Short Term Goal 2 (Week 1): Pt will complete functional transfers with min A OT Short Term Goal 3 (Week 1): Pt will stand to complete 1 grooming task in order to increase functional activity tolerance OT Short Term Goal 4 (Week 1): Pt will maintain sitting balance EOB during functional task with supervision/VCs  Skilled Therapeutic Interventions/Progress Updates:    1:1. Pt reporting fatigue and decreased BP/orthostsis with PT. BP measured throuhgout session as follows. Pt requires min A for supine>EOB and brushes teeth sitting EOB with set up and A to suction secretions. Pt requires VC for midline orientation with fatigue. Exited session pt supine in bed call light in reach and all needs met.  Supine 122/53 Sitting EOB with abdominal binder 112/59 Sitting EOB after 15 min 102/55  Therapy Documentation Precautions:  Precautions Precautions: Fall Other Brace/Splint: pt has an AFO at home for her right leg, but it is too heavy for her to use.  Restrictions Weight Bearing Restrictions: No  See Function Navigator for Current Functional Status.   Therapy/Group: Individual Therapy   M  12/17/2017, 3:35 PM 

## 2017-12-18 MED ORDER — ISOSORBIDE MONONITRATE ER 30 MG PO TB24
30.0000 mg | ORAL_TABLET | Freq: Every day | ORAL | Status: DC
  Administered 2017-12-19 – 2017-12-21 (×3): 30 mg via ORAL
  Filled 2017-12-18 (×3): qty 1

## 2017-12-18 NOTE — Progress Notes (Signed)
HS IVF's. Declined to take dentures out at HS, "I've always slept with them in at night." Slept good. Molly [name] A

## 2017-12-18 NOTE — Plan of Care (Signed)
  Progressing Consults RH STROKE PATIENT EDUCATION Description See Patient Education module for education specifics  12/18/2017 [phone] - Progressing by Cornell Barman, RN RH BOWEL ELIMINATION RH STG MANAGE BOWEL WITH ASSISTANCE Description STG Manage Bowel with min Assistance to [facility] and bathroom.  12/18/2017 7915 - Progressing by Cornell Barman, RN RH BLADDER ELIMINATION RH STG MANAGE BLADDER WITH ASSISTANCE Description STG Manage Bladder With min Assistance to [facility] and bathroom.  12/18/2017 0569 - Progressing by Cornell Barman, RN RH SKIN INTEGRITY RH STG MAINTAIN SKIN INTEGRITY WITH ASSISTANCE Description STG Maintain Skin Integrity With min Assistance and patient to assist with rotating in bed and boosting in chair.  12/18/2017 7948 - Progressing by Cornell Barman, RN RH SAFETY RH STG ADHERE TO SAFETY PRECAUTIONS W/ASSISTANCE/DEVICE Description STG Adhere to Safety Precautions With min Assistance and appropriate assistive Device.  12/18/2017 0165 - Progressing by Cornell Barman, RN RH STG DECREASED RISK OF FALL WITH ASSISTANCE Description STG Decreased Risk of Fall With min Assistance with toileting.  12/18/2017 5374 - Progressing by Cornell Barman, RN RH PAIN MANAGEMENT RH STG PAIN MANAGED AT OR BELOW PT'S PAIN GOAL Description <3 on a 0-10 pain scale  12/18/2017 0625 - Progressing by Cornell Barman, RN RH KNOWLEDGE DEFICIT RH STG INCREASE KNOWLEDGE OF HYPERTENSION Description Patient and family will demonstrate knowledge on HTN medications, dosages, and any changes made. Follow-up care with the MD post discharge with min assist from staff.  12/18/2017 8270 - Progressing by Cornell Barman, RN

## 2017-12-18 NOTE — Progress Notes (Signed)
Subjective/Complaints: HA/Neck pain, left side last a second or two no visual changes ROS:  No CP, SOB, +nocturia last noc Objective: Vital Signs: Blood pressure (!) 87/46, pulse 66, temperature 98 F (36.7 C), temperature source Oral, resp. rate 16, height 5\' 2"  (1.575 m), weight 71.7 kg (158 lb 1.1 oz), SpO2 100 %. No results found. No results found for this or any previous visit (from the past 72 hour(s)).   HEENT: mild left facial droop Cardio: RRR and no murmur Resp: CTA B/L and unlabored GI: BS positive and NT, ND Extremity:  No Edema Skin:   Other IV site CDI left forearm, ecchymosis B forearms Neuro: Alert/Oriented, Cranial Nerve Abnormalities Left central VII, Abnormal Motor 4+/5 in BUE and BLE except 3+ R ankle DF and Dysarthric Musc/Skel:  Other no pain with UE or LE AROM Gen NAD   Assessment/Plan: 1. Functional deficits secondary to Right MCA infarct with worsened gait and dysarthria and dysphagia, ADL status has declined as well which require 3+ hours per day of interdisciplinary therapy in a comprehensive inpatient rehab setting. Physiatrist is providing close team supervision and 24 hour management of active medical problems listed below. Physiatrist and rehab team continue to assess barriers to discharge/monitor patient progress toward functional and medical goals. FIM: Function - Bathing Bathing activity did not occur: Refused Position: Shower Body parts bathed by patient: Right arm, Left arm, Chest, Abdomen, Front perineal area, Buttocks, Right upper leg, Left upper leg Body parts bathed by helper: Right lower leg, Left lower leg, Back Assist Level: Touching or steadying assistance(Pt > 75%)  Function- Upper Body Dressing/Undressing What is the patient wearing?: Bra, Pull over shirt/dress Bra - Perfomed by patient: Thread/unthread right bra strap, Thread/unthread left bra strap Bra - Perfomed by helper: Hook/unhook bra (pull down sports bra) Pull over  shirt/dress - Perfomed by patient: Thread/unthread right sleeve, Thread/unthread left sleeve, Put head through opening, Pull shirt over trunk Pull over shirt/dress - Perfomed by helper: Pull shirt over trunk Assist Level: Touching or steadying assistance(Pt > 75%) Function - Lower Body Dressing/Undressing What is the patient wearing?: Pants, Shoes Position: Wheelchair/chair at sink Pants- Performed by patient: Thread/unthread right pants leg, Thread/unthread left pants leg, Pull pants up/down Pants- Performed by helper: Thread/unthread right pants leg, Thread/unthread left pants leg, Pull pants up/down Non-skid slipper socks- Performed by helper: Don/doff right sock, Don/doff left sock Socks - Performed by helper: Don/doff right sock, Don/doff left sock Shoes - Performed by helper: Don/doff right shoe, Don/doff left shoe TED Hose - Performed by helper: Don/doff right TED hose, Don/doff left TED hose Assist for footwear: Partial/moderate assist Assist for lower body dressing: Touching or steadying assistance (Pt > 75%)  Function - Toileting Toileting activity did not occur: No continent bowel/bladder event Toileting steps completed by patient: Adjust clothing prior to toileting, Adjust clothing after toileting Toileting steps completed by helper: Performs perineal hygiene Toileting Assistive Devices: Grab bar or rail Assist level: Touching or steadying assistance (Pt.75%)  Function - Air cabin crew transfer assistive device: Elevated toilet seat/BSC over toilet, Grab bar Assist level to toilet: Touching or steadying assistance (Pt > 75%) Assist level from toilet: Touching or steadying assistance (Pt > 75%)  Function - Chair/bed transfer Chair/bed transfer method: Stand pivot Chair/bed transfer assist level: Supervision or verbal cues Chair/bed transfer assistive device: Armrests, Walker Chair/bed transfer details: Tactile cues for sequencing, Tactile cues for initiation, Verbal  cues for technique, Verbal cues for precautions/safety  Function - Locomotion: Wheelchair Will patient use  wheelchair at discharge?: Yes Type: Manual Max wheelchair distance: 66' Assist Level: Supervision or verbal cues Wheel 50 feet with 2 turns activity did not occur: Safety/medical concerns Wheel 150 feet activity did not occur: Safety/medical concerns Turns around,maneuvers to table,bed, and toilet,negotiates 3% grade,maneuvers on rugs and over doorsills: No Function - Locomotion: Ambulation Assistive device: Walker-rolling Max distance: 191ft Assist level: Supervision or verbal cues Assist level: Supervision or verbal cues Assist level: Supervision or verbal cues Walk 150 feet activity did not occur: Safety/medical concerns Assist level: Supervision or verbal cues Walk 10 feet on uneven surfaces activity did not occur: Safety/medical concerns Assist level: Touching or steadying assistance (Pt > 75%)  Function - Comprehension Comprehension: Auditory Comprehension assist level: Follows basic conversation/direction with no assist  Function - Expression Expression: Verbal Expression assist level: Expresses basic needs/ideas: With extra time/assistive device  Function - Social Interaction Social Interaction assist level: Interacts appropriately 90% of the time - Needs monitoring or encouragement for participation or interaction.  Function - Problem Solving Problem solving assist level: Solves basic 90% of the time/requires cueing < 10% of the time  Function - Memory Memory assist level: Recognizes or recalls 90% of the time/requires cueing < 10% of the time Patient normally able to recall (first 3 days only): Current season, Location of own room, Staff names and faces, That he or she is in a hospital  Medical Problem List and Plan: 1.  Weakness, dysphagia, limitations with mobility and ADLs secondary to suspected R-MCA embolic stroke.Cont CIR PT, OT, SLP2.  DVT  Prophylaxis/Anticoagulation: Pharmaceutical: Lovenox, PLT 163 3. Peripheral neuropathy/Pain Management: Resume bedtime tylenol. Will schedule home dose Neurontin to help with insomnia as well as BLE neuropathy (used prn at home).  4. Mood: LCSW to follow for evaluation and support.  5. Neuropsych: This patient is not fully capable of making decisions on her own behalf. 6. Skin/Wound Care: routine pressure relief measures.  7. Fluids/Electrolytes/Nutrition: Monitor I/O. Encourage fluid intake--renal status WNL. Po fluid ~453ml              8. HTN: Avoid hypotension to allow for adequate perfusion  Low systolic this am, reduce Imdur to daily Vitals:   12/18/17 0500 12/18/17 0841  BP: (!) 156/62 (!) 87/46  Pulse: (!) 56 66  Resp: 16   Temp: 98 F (36.7 C)   SpO2: 100%    9. CAD: On metoprolol, Crestor and Imdur ( reduce to 30mg  daily).  10 PAF: To follow up with cardiology for TEE/loop recorder after discharge. On plavix for CVA prophyllaxis 11. GERD: Resume PPI.  12. Spinal stenosis with right foot drop: Neurontin at bedtime prn neuropathy. She does not like to use her AFO.  13. ABLA: Repeat CBC stable Hgb 11 14. Constipation: Better per family--continue Senna 2  bid.  15. Hypokalemia: Resolved with supplement.KCL 3.9 on 1/1 16.  Orthostatic hypotension likely volume and med related, very poor oral intake of fluid, IV .45NS 40ml/hr 7p-7a  LOS (Days) 6 A FACE TO FACE EVALUATION WAS PERFORMED  Charlett Blake 12/18/2017, 9:08 AM

## 2017-12-19 ENCOUNTER — Inpatient Hospital Stay (HOSPITAL_COMMUNITY): Payer: Medicare Other | Admitting: Speech Pathology

## 2017-12-19 ENCOUNTER — Inpatient Hospital Stay (HOSPITAL_COMMUNITY): Payer: Medicare Other | Admitting: Occupational Therapy

## 2017-12-19 ENCOUNTER — Inpatient Hospital Stay (HOSPITAL_COMMUNITY): Payer: Medicare Other | Admitting: Physical Therapy

## 2017-12-19 LAB — BASIC METABOLIC PANEL
Anion gap: 8 (ref 5–15)
BUN: 15 mg/dL (ref 6–20)
CO2: 25 mmol/L (ref 22–32)
Calcium: 9.5 mg/dL (ref 8.9–10.3)
Chloride: 106 mmol/L (ref 101–111)
Creatinine, Ser: 0.84 mg/dL (ref 0.44–1.00)
GFR calc Af Amer: >60 mL/min (ref >60)
GFR calc non Af Amer: >60 mL/min (ref >60)
Glucose, Bld: 129 mg/dL — ABNORMAL HIGH (ref 65–99)
POTASSIUM: 4.4 mmol/L (ref 3.5–5.1)
Sodium: 139 mmol/L (ref 135–145)

## 2017-12-19 LAB — CBC
HCT: 38.6 % (ref 36.0–46.0)
Hemoglobin: 12.3 g/dL (ref 12.0–15.0)
MCH: 31 pg (ref 26.0–34.0)
MCHC: 31.9 g/dL (ref 30.0–36.0)
MCV: 97.2 fL (ref 78.0–100.0)
Platelets: 204 10*3/uL (ref 150–400)
RBC: 3.97 MIL/uL (ref 3.87–5.11)
RDW: 13 % (ref 11.5–15.5)
WBC: 4.6 10*3/uL (ref 4.0–10.5)

## 2017-12-19 NOTE — Progress Notes (Addendum)
Subjective/Complaints: Urinary incont with IVF yesterday ROS:  No CP, SOB, +nocturia last noc Objective: Vital Signs: Blood pressure (!) 154/66, pulse 65, temperature 99 F (37.2 C), temperature source Axillary, resp. rate 16, height 5\' 2"  (1.575 m), weight 71.7 kg (158 lb 1.1 oz), SpO2 100 %. No results found. No results found for this or any previous visit (from the past 72 hour(s)).   HEENT: mild left facial droop Cardio: RRR and no murmur Resp: CTA B/L and unlabored GI: BS positive and NT, ND Extremity:  No Edema Skin:   Other IV site CDI left forearm, ecchymosis B forearms Neuro: Alert/Oriented, Cranial Nerve Abnormalities Left central VII, Abnormal Motor 4+/5 in BUE and BLE except 3+ R ankle DF and Dysarthric Musc/Skel:  Other no pain with UE or LE AROM Gen NAD   Assessment/Plan: 1. Functional deficits secondary to Right MCA infarct with worsened gait and dysarthria and dysphagia, ADL status has declined as well which require 3+ hours per day of interdisciplinary therapy in a comprehensive inpatient rehab setting. Physiatrist is providing close team supervision and 24 hour management of active medical problems listed below. Physiatrist and rehab team continue to assess barriers to discharge/monitor patient progress toward functional and medical goals. FIM: Function - Bathing Bathing activity did not occur: Refused Position: Shower Body parts bathed by patient: Right arm, Left arm, Chest, Abdomen, Front perineal area, Buttocks, Right upper leg, Left upper leg Body parts bathed by helper: Right lower leg, Left lower leg, Back Assist Level: Touching or steadying assistance(Pt > 75%)  Function- Upper Body Dressing/Undressing What is the patient wearing?: Bra, Pull over shirt/dress Bra - Perfomed by patient: Thread/unthread right bra strap, Thread/unthread left bra strap Bra - Perfomed by helper: Hook/unhook bra (pull down sports bra) Pull over shirt/dress - Perfomed by  patient: Thread/unthread right sleeve, Thread/unthread left sleeve, Put head through opening, Pull shirt over trunk Pull over shirt/dress - Perfomed by helper: Pull shirt over trunk Assist Level: Touching or steadying assistance(Pt > 75%) Function - Lower Body Dressing/Undressing What is the patient wearing?: Pants, Shoes Position: Wheelchair/chair at sink Pants- Performed by patient: Thread/unthread right pants leg, Thread/unthread left pants leg, Pull pants up/down Pants- Performed by helper: Thread/unthread right pants leg, Thread/unthread left pants leg, Pull pants up/down Non-skid slipper socks- Performed by helper: Don/doff right sock, Don/doff left sock Socks - Performed by helper: Don/doff right sock, Don/doff left sock Shoes - Performed by helper: Don/doff right shoe, Don/doff left shoe TED Hose - Performed by helper: Don/doff right TED hose, Don/doff left TED hose Assist for footwear: Partial/moderate assist Assist for lower body dressing: Touching or steadying assistance (Pt > 75%)  Function - Toileting Toileting activity did not occur: No continent bowel/bladder event Toileting steps completed by patient: Adjust clothing prior to toileting, Adjust clothing after toileting Toileting steps completed by helper: Performs perineal hygiene Toileting Assistive Devices: Grab bar or rail Assist level: Touching or steadying assistance (Pt.75%)  Function - Air cabin crew transfer assistive device: Elevated toilet seat/BSC over toilet, Grab bar Assist level to toilet: Touching or steadying assistance (Pt > 75%) Assist level from toilet: Touching or steadying assistance (Pt > 75%)  Function - Chair/bed transfer Chair/bed transfer method: Stand pivot Chair/bed transfer assist level: Supervision or verbal cues Chair/bed transfer assistive device: Armrests, Walker Chair/bed transfer details: Tactile cues for sequencing, Tactile cues for initiation, Verbal cues for technique,  Verbal cues for precautions/safety  Function - Locomotion: Wheelchair Will patient use wheelchair at discharge?: Yes Type: Manual Max  wheelchair distance: 82' Assist Level: Supervision or verbal cues Wheel 50 feet with 2 turns activity did not occur: Safety/medical concerns Wheel 150 feet activity did not occur: Safety/medical concerns Turns around,maneuvers to table,bed, and toilet,negotiates 3% grade,maneuvers on rugs and over doorsills: No Function - Locomotion: Ambulation Assistive device: Walker-rolling Max distance: 135ft Assist level: Supervision or verbal cues Assist level: Supervision or verbal cues Assist level: Supervision or verbal cues Walk 150 feet activity did not occur: Safety/medical concerns Assist level: Supervision or verbal cues Walk 10 feet on uneven surfaces activity did not occur: Safety/medical concerns Assist level: Touching or steadying assistance (Pt > 75%)  Function - Comprehension Comprehension: Auditory Comprehension assist level: Follows basic conversation/direction with no assist  Function - Expression Expression: Verbal Expression assist level: Expresses basic needs/ideas: With extra time/assistive device  Function - Social Interaction Social Interaction assist level: Interacts appropriately 90% of the time - Needs monitoring or encouragement for participation or interaction.  Function - Problem Solving Problem solving assist level: Solves basic 90% of the time/requires cueing < 10% of the time  Function - Memory Memory assist level: Recognizes or recalls 90% of the time/requires cueing < 10% of the time Patient normally able to recall (first 3 days only): Current season, Location of own room, Staff names and faces, That he or she is in a hospital  Medical Problem List and Plan: 1.  Weakness, dysphagia, limitations with mobility and ADLs secondary to suspected R-MCA embolic stroke.Cont CIR PT, OT, SLP2.  DVT Prophylaxis/Anticoagulation:  Pharmaceutical: Lovenox, PLT 163 Will plan on D/C in am if BP remains stable during therapy 3. Peripheral neuropathy/Pain Management: Resume bedtime tylenol. Will schedule home dose Neurontin to help with insomnia as well as BLE neuropathy (used prn at home).  4. Mood: LCSW to follow for evaluation and support.  5. Neuropsych: This patient is not fully capable of making decisions on her own behalf. 6. Skin/Wound Care: routine pressure relief measures.  7. Fluids/Electrolytes/Nutrition: Monitor I/O. Encourage fluid intake--renal status WNL. Po fluid ~410ml diet upgraded Will d/c IVF             8. HTN: Avoid hypotension to allow for adequate perfusion higher systolic this am, reduced Imdur to daily 1/6 Vitals:   12/18/17 2046 12/19/17 0242  BP: (!) 164/75 (!) 154/66  Pulse: 62 65  Resp:  16  Temp:  99 F (37.2 C)  SpO2:    Allow BP to run a little high to compensate for orthostatic drops 9. CAD: On metoprolol, Crestor and Imdur ( reduce to 30mg  daily).  10 PAF: To follow up with cardiology for TEE/loop recorder after discharge. On plavix for CVA prophyllaxis 11. GERD: Resume PPI.  12. Spinal stenosis with right foot drop: Neurontin at bedtime prn neuropathy. She does not like to use her AFO.  13. ABLA: Repeat CBC stable Hgb 11 14. Constipation: Better per family--continue Senna 2  bid.  15. Hypokalemia: Resolved with supplement.KCL 3.9 on 1/1   LOS (Days) 7 A FACE TO FACE EVALUATION WAS PERFORMED  Charlett Blake 12/19/2017, 8:09 AM

## 2017-12-19 NOTE — Progress Notes (Signed)
Speech Language Pathology Discharge Summary  Patient Details  Name: Vertis Bauder MRN: [id number] Date of Birth: 08/21/1929  Today's Date: 12/19/2017 SLP Individual Time: 1100-1200 SLP Individual Time Calculation (min): 60 min   Skilled Therapeutic Interventions:  Skilled treatment session focused on skilled observation of pt consuming dysphagia 3 lunch tray with thin liquids via regular cup sips. Pt was instructed not to use chin tuck to assess for any possible overt s/s of aspiration. Pt consumed all items without any overt s/s of aspiration. Pt's daughter present and all education completed on recommended PO textured items and general aspiration precautions such as being upright when eating. Daughter voiced understanding and all education has been completed.      Patient has met 5 of 5 long term goals.  Patient to discharge at overall Supervision;Min level.   Clinical Impression/Discharge Summary:   Pt has made good progress during ST sessions and as a result she has met 5 of 5 LTGs. Pt is currently at a supervision to Hall A level during cognitive tasks. She appears at baseline with speech intelligibility as at baseline she was typically "frail speaking." Pt currently consumes dysphagia 3 with thin liquids without use of Provale cup or chin tuck without overt s/s of aspiration.   Care Partner:  Caregiver Able to Provide Assistance: Yes  Type of Caregiver Assistance: Physical;Cognitive  Recommendation:  24 hour supervision/assistance      Equipment:     Reasons for discharge: Treatment goals met;Discharged from hospital   Patient/Family Agrees with Progress Made and Goals Achieved: Yes   Function:  Eating Eating   Modified Consistency Diet: Yes Eating Assist Level: More than reasonable amount of time;Supervision or verbal cues   Eating Set Up Assist For: Opening containers;Cutting food       Cognition Comprehension Comprehension assist level: Follows basic  conversation/direction with no assist  Expression   Expression assist level: Expresses basic needs/ideas: With extra time/assistive device  Social Interaction Social Interaction assist level: Interacts appropriately 90% of the time - Needs monitoring or encouragement for participation or interaction.  Problem Solving Problem solving assist level: Solves basic 90% of the time/requires cueing < 10% of the time  Memory Memory assist level: Recognizes or recalls 90% of the time/requires cueing < 10% of the time   Andrianna Manalang 12/19/2017, 12:30 PM

## 2017-12-19 NOTE — Progress Notes (Signed)
Physical Therapy Session Note  Patient Details  Name: Molly [name] MRN: [id number] Date of Birth: [birth year]  Today's Date: 12/19/2017 PT Individual Time: 1400-1500 PT Individual Time Calculation (min): 60 min   Short Term Goals: Week 1:  PT Short Term Goal 1 (Week 1): Pt will transfer bed<>chair w/ min assist PT Short Term Goal 2 (Week 1): Pt will perform bed mobility w/ supervision PT Short Term Goal 3 (Week 1): Pt will tolerate 30 min of OOB/upright activity w/o increase in fatigue  Skilled Therapeutic Interventions/Progress Updates: Pt presented sitting at EOB agreeable to therapy. Performed ambulatory transfer to w/c with rollator min guard with increased time. Transported pt to rehab gym and performed functional activities as noted in function tab. Pt able to perform all activities intially min guard fading to supervision with increased time. BP monitored intially 105/58 sitting EOB, 121/59 after ambulation. Pt denying any symptoms of orthostatis during session. Performed NuStep L2 x 6 min for endurance and cardiovascular conditioning. Pt returned to room and requesting to use toilet. Pt ambulated to toilet and no LOB while performing LB management. Pt handed off to NT to complete vitals check.      Therapy Documentation Precautions:  Precautions Precautions: Fall Other Brace/Splint: pt has an AFO at home for her right leg, but it is too heavy for her to use.  Restrictions Weight Bearing Restrictions: No General:   Vital Signs: Oxygen Therapy SpO2: 100 % O2 Device: Not Delivered Pain: Pain Assessment Pain Score: Asleep Faces Pain Scale: No hurt PAINAD (Pain Assessment in Advanced Dementia) Breathing: normal Negative Vocalization: none Facial Expression: smiling or inexpressive Body Language: relaxed Consolability: no need to console PAINAD Score: 0 Mobility: Transfers Transfers: Yes Sit to Stand: 4: Min guard;5: Supervision Sit to Stand Details: Verbal cues for  safe use of DME/AE Sit to Stand Details (indicate cue type and reason): increased time Stand to Sit: 4: Min guard;5: Supervision Stand to Sit Details: increased time Stand Pivot Transfers: 5: Supervision Locomotion : Ambulation Ambulation/Gait Assistance: 4: Min guard Ambulation Distance (Feet): 135 Feet Assistive device: Rollator Gait Gait: Yes Gait Pattern: Impaired Gait Pattern: Narrow base of support;Poor foot clearance - right Gait velocity: decreased Stairs / Additional Locomotion Stairs: No  Trunk/Postural Assessment : Cervical Assessment Cervical Assessment: Exceptions to WFL(forward head) Thoracic Assessment Thoracic Assessment: Exceptions to WFL(Kyphotic) Lumbar Assessment Lumbar Assessment: Exceptions to WFL(posterior pelvic tilt) Postural Control Postural Control: Deficits on evaluation  Balance: Balance Balance Assessed: Yes Static Sitting Balance Static Sitting - Balance Support: No upper extremity supported;Feet supported Static Sitting - Level of Assistance: 5: Stand by assistance Dynamic Sitting Balance Dynamic Sitting - Balance Support: Feet supported;No upper extremity supported Dynamic Sitting - Level of Assistance: 5: Stand by assistance Sitting balance - Comments: Sitting to complete bathing tasks Static Standing Balance Static Standing - Balance Support: Bilateral upper extremity supported Static Standing - Level of Assistance: 5: Stand by assistance Dynamic Standing Balance Dynamic Standing - Balance Support: Bilateral upper extremity supported Dynamic Standing - Level of Assistance: 4: Min assist Dynamic Standing - Comments: Standing to complete LB bathing/dressing and toileting tasks Exercises:   Other Treatments:     See Function Navigator for Current Functional Status.   Therapy/Group: Individual Therapy  Kamali Sakata 12/19/2017, 4:37 PM

## 2017-12-19 NOTE — Progress Notes (Addendum)
Occupational Therapy Discharge Summary  Patient Details  Name: Molly [name] MRN: [id number] Date of Birth: [birth year]   Patient has met 9 of 9 long term goals due to improved activity tolerance, improved balance and postural control.  Patient to discharge at overall Supervision-min A level.  Patient's care partner is independent to provide the necessary physical and cognitive assistance at discharge.  Pt has paid caregivers as well as daughter who she lives with who can provide needed assist at d/c. Family reports pt has returned to PLOF. Cont to educate and encourage importance of participation and independence with ADL tasks. Pt takes significantly increased time to complete all tasks though has demonstrated ability to complete basic ADLs at supervision-min A level. Pt's caregivers provide up to mod-max A for LB bathing/dressing.   Recommendation:  Patient will benefit from ongoing skilled OT services in home health setting to continue to advance functional skills in the area of BADL and Reduce care partner burden.  Equipment: BSC, recommending use at bedside at night to limit pt's risk of fall. Pt has elevated toilets and grab bars in bathroom. Pt's daughter educated regarding recommendation  Reasons for discharge: treatment goals met and discharge from hospital  Patient/family agrees with progress made and goals achieved: Yes  OT Discharge Precautions/Restrictions  Precautions Precautions: Fall Restrictions Weight Bearing Restrictions: No Vision Baseline Vision/History: Wears glasses Wears Glasses: At all times Patient Visual Report: No change from baseline Vision Assessment?: No apparent visual deficits Visual Fields: No apparent deficits Perception  Perception: Within Functional Limits Praxis Praxis: Intact Cognition Overall Cognitive Status: Within Functional Limits for tasks assessed Arousal/Alertness: Awake/alert Orientation Level: Oriented X4 Attention:  Sustained Sustained Attention: Appears intact Memory: Appears intact Awareness: Appears intact Problem Solving: Appears intact Problem Solving Impairment: Functional complex;Verbal complex Executive Function: Initiating Initiating: Appears intact Safety/Judgment: Appears intact Comments: Pt moves very slowly and requires increased time with all tasks, pt's caregiver reports this is not new Sensation Sensation Light Touch: Appears Intact Proprioception: Appears Intact Coordination Gross Motor Movements are Fluid and Coordinated: No Fine Motor Movements are Fluid and Coordinated: Yes Coordination and Movement Description: Gross motor movements impaired 2/2 severely decreased speed of movement Motor  Motor Motor: Within Functional Limits Motor - Discharge Observations: Generalized weakness and deconditioning Trunk/Postural Assessment  Cervical Assessment Cervical Assessment: (Forward head) Thoracic Assessment Thoracic Assessment: Exceptions to WFL(Kyphotic) Lumbar Assessment Lumbar Assessment: Exceptions to WFL(Posterior pelvic tilt) Postural Control Postural Control: Deficits on evaluation(delayed; posterior bias)  Balance Balance Balance Assessed: Yes Static Sitting Balance Static Sitting - Balance Support: No upper extremity supported;Feet supported Static Sitting - Level of Assistance: 5: Stand by assistance Dynamic Sitting Balance Dynamic Sitting - Balance Support: During functional activity;No upper extremity supported Dynamic Sitting - Level of Assistance: 5: Stand by assistance Sitting balance - Comments: Sitting to complete bathing tasks Static Standing Balance Static Standing - Balance Support: During functional activity;Right upper extremity supported;Left upper extremity supported Static Standing - Level of Assistance: 5: Stand by assistance Dynamic Standing Balance Dynamic Standing - Balance Support: During functional activity;Right upper extremity  supported;Left upper extremity supported Dynamic Standing - Level of Assistance: 5: Stand by assistance;4: Min assist Dynamic Standing - Comments: Standing to complete LB bathing/dressing and toileting tasks Extremity/Trunk Assessment RUE Assessment RUE Assessment: Within Functional Limits LUE Assessment LUE Assessment: Within Functional Limits   See Function Navigator for Current Functional Status.  Molly [name] 12/19/2017, 12:48 PM

## 2017-12-19 NOTE — Progress Notes (Signed)
Physical Therapy Discharge Summary  Patient Details  Name: Molly [name] MRN: [id number] Date of Birth: 02/21/1929  Today's Date: 12/19/2017   Patient has met 5 of 8 long term goals due to improved activity tolerance, improved balance, improved postural control, increased strength, improved attention and improved awareness. Pt made improvements in mobility and endurance however pt was limited due to bouts of orthostasis which limited progression in therapy.Pt will be d/c with 24/7 supervision.  Patient to discharge at an ambulatory level Supervision.   Patient's care partner is independent to provide the necessary physical assistance at discharge.  Reasons goals not met: Pt to be d/c at ambulatory status.   Recommendation:  Patient will benefit from ongoing skilled PT services in home health setting to continue to advance safe functional mobility, address ongoing impairments in balance, endurance, gait, strength, safety, and minimize fall risk.  Equipment: No equipment provided  Reasons for discharge: treatment goals met  Patient/family agrees with progress made and goals achieved: Yes  PT Discharge Precautions/Restrictions Precautions Precautions: Fall Restrictions Weight Bearing Restrictions: No Vital Signs Oxygen Therapy SpO2: 100 % O2 Device: Not Delivered Pain Pain Assessment Pain Score: Asleep Faces Pain Scale: No hurt PAINAD (Pain Assessment in Advanced Dementia) Breathing: normal Negative Vocalization: none Facial Expression: smiling or inexpressive Body Language: relaxed Consolability: no need to console PAINAD Score: 0 Vision/Perception  Perception Perception: Within Functional Limits Praxis Praxis: Intact  Cognition Overall Cognitive Status: Within Functional Limits for tasks assessed Arousal/Alertness: Awake/alert Orientation Level: Oriented X4 Attention: Sustained Sustained Attention: Appears intact Memory: Appears intact Awareness: Appears  intact Problem Solving: Appears intact Problem Solving Impairment: Functional complex;Verbal complex Executive Function: Initiating Initiating: Appears intact Safety/Judgment: Appears intact Comments: Pt moves very slowly and requires increased time with all tasks, pt's caregiver reports this is not new Sensation Sensation Light Touch: Appears Intact Proprioception: Appears Intact Coordination Gross Motor Movements are Fluid and Coordinated: No Fine Motor Movements are Fluid and Coordinated: Yes Coordination and Movement Description: Gross motor movements impaired 2/2 severely decreased speed of movement Motor  Motor Motor: Within Functional Limits Motor - Discharge Observations: Generalized weakness and deconditioning  Mobility Transfers Transfers: Yes Sit to Stand: 4: Min guard;5: Supervision Sit to Stand Details: Verbal cues for safe use of DME/AE Sit to Stand Details (indicate cue type and reason): increased time Stand to Sit: 4: Min guard;5: Supervision Stand to Sit Details: increased time Stand Pivot Transfers: 5: Supervision Locomotion  Ambulation Ambulation/Gait Assistance: 4: Min guard Ambulation Distance (Feet): 135 Feet Assistive device: Rollator Gait Gait: Yes Gait Pattern: Impaired Gait Pattern: Narrow base of support;Poor foot clearance - right Gait velocity: decreased Stairs / Additional Locomotion Stairs: No  Trunk/Postural Assessment  Cervical Assessment Cervical Assessment: Exceptions to WFL(forward head) Thoracic Assessment Thoracic Assessment: Exceptions to WFL(Kyphotic) Lumbar Assessment Lumbar Assessment: Exceptions to WFL(posterior pelvic tilt) Postural Control Postural Control: Deficits on evaluation  Balance Balance Balance Assessed: Yes Static Sitting Balance Static Sitting - Balance Support: No upper extremity supported;Feet supported Static Sitting - Level of Assistance: 5: Stand by assistance Dynamic Sitting Balance Dynamic Sitting  - Balance Support: Feet supported;No upper extremity supported Dynamic Sitting - Level of Assistance: 5: Stand by assistance Sitting balance - Comments: Sitting to complete bathing tasks Static Standing Balance Static Standing - Balance Support: Bilateral upper extremity supported Static Standing - Level of Assistance: 5: Stand by assistance Dynamic Standing Balance Dynamic Standing - Balance Support: Bilateral upper extremity supported Dynamic Standing - Level of Assistance: 4: Min assist Dynamic Standing -  Comments: Standing to complete LB bathing/dressing and toileting tasks Extremity Assessment  RUE Assessment RUE Assessment: Within Functional Limits LUE Assessment LUE Assessment: Within Functional Limits RLE Assessment RLE Assessment: Within Functional Limits LLE Assessment LLE Assessment: Within Functional Limits   See Function Navigator for Current Functional Status.  Rosita DeChalus 12/19/2017, 4:36 PM

## 2017-12-19 NOTE — Progress Notes (Signed)
Occupational Therapy Session Note  Patient Details  Name: Molly [name] MRN: [id number] Date of Birth: 05/15/1929  Today's Date: 12/19/2017 OT Individual Time: 9476-5465 OT Individual Time Calculation (min): 75 min    Short Term Goals: Week 1:  OT Short Term Goal 1 (Week 1): Pt will complete toileting task with min A OT Short Term Goal 2 (Week 1): Pt will complete functional transfers with min A OT Short Term Goal 3 (Week 1): Pt will stand to complete 1 grooming task in order to increase functional activity tolerance OT Short Term Goal 4 (Week 1): Pt will maintain sitting balance EOB during functional task with supervision/VCs  Skilled Therapeutic Interventions/Progress Updates:    Pt seen for OT ADL bathing/dressing session. Pt sitting up in supine upon arrival with daughter present, agreeable to tx session. She transferred to EOB, using hospital bed functions with signifcicant increased time and effort to manage R LE off EOB. She ambulated to bathroom with RW and close supervision. Completed toileting task, using grab bar to assist with dynamic balance. She transitioned into shower, bathed seated on tub bench.  She ambulatd out of shower and dressed seated in w/c. Education provided for hemi dressing technique. Encouragement and significantly increase dtime required for all dressing aspects. She stood at [facility] to pull pants up. Pt left seated in w/c at end of session at sink with daughter assisting with grooming tasks.  Education provided throughout session regarding DME and recommendations/techniques for toileting and shower transfers. Recommend use of BSC at bedside at night in order to reduce fall risk, pt's daughter aware and agreeable. Also demonstrated technique for stepping into shower stall using RW.   Therapy Documentation Precautions:  Precautions Precautions: Fall Other Brace/Splint: pt has an AFO at home for her right leg, but it is too heavy for her to use.   Restrictions Weight Bearing Restrictions: No Pain:   No/denies pain  See Function Navigator for Current Functional Status.   Therapy/Group: Individual Therapy  Naftula Donahue L 12/19/2017, 7:09 AM

## 2017-12-19 NOTE — Discharge Summary (Signed)
Physician Discharge Summary  Patient ID: Molly [name] MRN: [id number] DOB/AGE: 1929-01-14 84 y.o.  Admit date: 12/12/2017 Discharge date: 12/21/2017  Discharge Diagnoses:  Principal Problem:   Acute ischemic right MCA stroke [facility]) Active Problems:   CAD (coronary artery disease)   Dysphagia, post-stroke   Benign essential HTN   Neuropathic pain   Orthostatic hypotension   Discharged Condition:  Stable   Significant Diagnostic Studies: N/A   Labs:  Basic Metabolic Panel: BMP Latest Ref Rng & Units 12/19/2017 12/13/2017 12/12/2017  Glucose 65 - 99 mg/dL 129(H) 101(H) 103(H)  BUN 6 - 20 mg/dL 15 13 10   Creatinine 0.44 - 1.00 mg/dL 0.84 0.63 0.61  Sodium 135 - 145 mmol/L 139 139 140  Potassium 3.5 - 5.1 mmol/L 4.4 3.9 4.3  Chloride 101 - 111 mmol/L 106 105 113(H)  CO2 22 - 32 mmol/L 25 25 22   Calcium 8.9 - 10.3 mg/dL 9.5 9.6 8.5(L)    CBC: CBC Latest Ref Rng & Units 12/19/2017 12/13/2017 12/12/2017  WBC 4.0 - 10.5 K/uL 4.6 4.6 5.5  Hemoglobin 12.0 - 15.0 g/dL 12.3 11.0(L) 10.5(L)  Hematocrit 36.0 - 46.0 % 38.6 33.0(L) 31.5(L)  Platelets 150 - 400 K/uL 204 163 146(L)    CBG: No results for input(s): GLUCAP in the last 168 hours.  Brief HPI:   Molly [name] a 84 y.o.femalewith history of CAD, h/o PE/DVT, RLE weakness, vertigo, PAF--no anticoagulation due epidural hematoma,  HTN who was admitted on 12/07/17 with left sided weakness, rightward gaze, difficulty speaking and inability to walk. CTA/P done revealing abnormal perfusion right frontal operculum with severe stenosis of right MCA mid M2 brach, no emergent large vessel infarct. She was treated with tPA and follow MRI brain 12/27 reviewed, negative for acute process or bleed. Dr Leonie Man felt that episode due to embolic R-MCA stroke not seen on MRI.   She had issues with hypotension past admission requiring fluid boluses.   TEE with loop recorder to be scheduled on outpatient basis. ASA was changed to Plavix due to  intracranial stenosis. She was placed on modified diet and liquids per Provale cup and chin tuck to prevent aspiration. Therapy evaluations showed functional deficits and CIR was recommended for follow up therapy.      Hospital Course: Shany Marinez was admitted to rehab 12/12/2017 for inpatient therapies to consist of PT, ST and OT at least three hours five days a week. Past admission physiatrist, therapy team and rehab RN have worked together to provide customized collaborative inpatient rehab. Her blood pressures were monitored on bid basis and she continued to have issues with orthostatic hypotension and dizziness. Therefore metoprolol was decreased to 12.5 mg and abdominal binder and TEDs were ordered to help with symptoms and she was briefly treated with nocturnal hydration with improvement in symptoms and activity.  Protonix was added to help manage GERD. Diet has been advance to regular textures and she is tolerating this without signs or symptoms of aspiration.  She reported issues with insomnia and neuropathy therefore home dose gabapentin was scheduled at bedtime to help with sleep wake disruption. Her renal status has been monitored and has been stable. Serial CBC showed that H/H has improved to baseline and no signs of bleeding noted. She did have recurrent issues with orthostatic changes and Imdur was decreased to once a a day. Her length of stay was extended by a day to help manage symptoms and she was treated with fluid bolus with improvement.  Her progress was limited by  bouts of orthostatic changes and she is currently at min-guard to supervision level. She will continue to receive follow up [place], [place] and [facility] by [place] after discharge.    Rehab course: During patient's stay in rehab weekly team conferences were held to monitor patient's progress, set goals and discuss barriers to discharge. At admission, patient required mod assist with ADL tasks and mobility. She  displayed  Moderate oral dysphagia with delayed processing with mild cognitive deficits and expressive deficits with MoCA score 24/30. She has had improvement in activity tolerance, balance, postural control, as well as ability to compensate for deficits.  She is able to complete ADL tasks with supervision to min assist. She requires min/guard assist to supervision with transfers and to ambulate 135' with min guard assist and rollator. She is able consuming dysphagia 3 textures and thin liquids without signs or symptoms of aspiration. Speech intelligibility is at baseline and she requires min assist with cognitive tasks. Family education was completed regarding all aspects of care with daughter and    Disposition: 01-Home or Self Care  Diet: Soft foods.   Special Instructions: 1. Take your time with positional changes. Sit for a few minutes before standing.  2. Call cardiology office for appointment if you have not contacted you by Monday.  3. Drink at least 1500 cc fluid daily.   Discharge Instructions    Ambulatory referral to Cardiology   Complete by:  As directed    Will need TEE/Loop recorder for stroke work up. Discharge set for 12/20/17   Ambulatory referral to Physical Medicine Rehab   Complete by:  As directed    1-2 weeks transitional care appt     Allergies as of 12/21/2017      Reactions   Buprenorphine Hcl Shortness Of Breath   Chest pain   Codeine Nausea And Vomiting   Hydrocodone Nausea And Vomiting   Morphine And Related Shortness Of Breath   Chest pain   Sulfa Antibiotics Nausea And Vomiting   Sulfacetamide Sodium Nausea And Vomiting   Vicodin [hydrocodone-acetaminophen] Nausea And Vomiting   Evista [raloxifene] Other (See Comments), Nausea And Vomiting   Upset stomach   Hydromorphone Nausea And Vomiting   Sensitive   Other Other (See Comments)   Narcotic-like medications - does NOT tolerate pain meds, causes unrelating volatile vomiting and drops in BP below  80/40 with dizziness and fainting Tourniquet - Please place tourniquet over cloth - causes sever bruising. Blood pressure cuff - please use manual blood pressure cuff - causes excoriating pain using machine and bruising      Sulfamethoxazole Nausea And Vomiting   Sulfamethoxazole-trimethoprim Nausea And Vomiting   Sulfur Nausea And Vomiting   Vomited until bleeding occurred and developed an active stomach ulcer   Tape Other (See Comments)   Rips skin   Morphine Nausea Only   Made me crazy      Medication List    STOP taking these medications   aspirin EC 81 MG tablet   omeprazole 40 MG capsule Commonly known as:  PRILOSEC Replaced by:  pantoprazole 40 MG tablet     TAKE these medications   acetaminophen 500 MG tablet Commonly known as:  TYLENOL Take 500 mg by mouth every 8 (eight) hours as needed for mild pain.   ALIGN 4 MG Caps Take 1 tablet by mouth every morning.   calcium-vitamin D 500-200 MG-UNIT tablet Commonly known as:  OSCAL WITH D Take 1 tablet by mouth 2 (two) times  daily.   clopidogrel 75 MG tablet Commonly known as:  PLAVIX Take 1 tablet (75 mg total) by mouth daily. Start taking on:  12/22/2017   denosumab 60 MG/ML Soln injection Commonly known as:  PROLIA Inject 60 mg into the skin every 6 (six) months. Administer in upper arm, thigh, or abdomen   Fish Oil 1000 MG Caps Take 1,000 mg by mouth 2 (two) times daily.   gabapentin 100 MG capsule Commonly known as:  NEURONTIN Take 100 mg by mouth at bedtime as needed (nerve pain).   isosorbide mononitrate 30 MG 24 hr tablet Commonly known as:  IMDUR Take 1 tablet (30 mg total) by mouth daily. What changed:  when to take this   metoprolol succinate 25 MG 24 hr tablet Commonly known as:  TOPROL-XL Take 0.5 tablets (12.5 mg total) by mouth daily. What changed:  how much to take   NARCOSOFT HERBAL LAX PO Take 2 capsules by mouth 2 (two) times daily.   nitroGLYCERIN 0.4 MG SL tablet Commonly known  as:  NITROSTAT Place 0.4 mg under the tongue every 5 (five) minutes as needed for chest pain (x 3 pills).   OSTEO BI-FLEX REGULAR STRENGTH PO Take 1 capsule by mouth 2 (two) times daily.   pantoprazole 40 MG tablet Commonly known as:  PROTONIX Take 1 tablet (40 mg total) by mouth daily. Start taking on:  12/22/2017 Replaces:  omeprazole 40 MG capsule   polyethylene glycol packet Commonly known as:  MIRALAX / GLYCOLAX Take 17 g by mouth daily as needed for mild constipation.   polysaccharide iron 150 MG capsule Generic drug:  iron polysaccharides Take 150 mg by mouth every morning.   rosuvastatin 20 MG tablet Commonly known as:  CRESTOR Take 20 mg by mouth at bedtime.   sennosides-docusate sodium 8.6-50 MG tablet Commonly known as:  SENOKOT-S Take 2 tablets by mouth 2 (two) times daily.   TURMERIC PO Take 1 tablet by mouth daily.   vitamin C 250 MG tablet Commonly known as:  ASCORBIC ACID Take 250 mg by mouth daily.      Follow-up Information    Kirsteins, Luanna Salk, MD Follow up.   Specialty:  Physical Medicine and Rehabilitation Why:  office will call you with follow up appointment Contact information: [place] [address] [phone]        Garvin Fila, MD. Call in 1 day(s).   Specialties:  Neurology, Radiology Why:  for follow up appointment in 4 weeks Contact information: [address] [place] [address] [phone]        Jettie Booze, MD Follow up on 01/04/2018.   Specialties:  Cardiology, Radiology, Interventional Cardiology Why:  Appointment at 9:30 am/ Discuss need for TEE/Loop recorder placement Contact information: [address] [place] [address] [phone]        [name], MD. Call in 2 day(s).   Specialty:  Family Medicine Why:  Need post hospital follow up in 2 weeks (office should be contacting you) Contact information: [address] [place] [address] [phone]           Signed: Bary Leriche 12/21/2017, 5:13 PM

## 2017-12-20 MED ORDER — METOPROLOL SUCCINATE ER 25 MG PO TB24
12.5000 mg | ORAL_TABLET | Freq: Every day | ORAL | Status: DC
  Administered 2017-12-20: 12.5 mg via ORAL
  Filled 2017-12-20: qty 1

## 2017-12-20 MED ORDER — SODIUM CHLORIDE 0.9 % IV BOLUS (SEPSIS)
250.0000 mL | Freq: Once | INTRAVENOUS | Status: AC
  Administered 2017-12-20: 250 mL via INTRAVENOUS

## 2017-12-20 NOTE — Progress Notes (Signed)
Subjective/Complaints:  ROS:  No CP, SOB, +nocturia last noc Objective: Vital Signs: Blood pressure (!) 144/52, pulse 65, temperature 98 F (36.7 C), temperature source Oral, resp. rate 20, height _0  (1.575 m), weight 71.7 kg (158 lb 1.1 oz), SpO2 98 %. No results found. Results for orders placed or performed during the hospital encounter of 12/12/17 (from the past 72 hour(s))  Basic metabolic panel     Status: Abnormal   Collection Time: 12/19/17  8:10 AM  Result Value Ref Range   Sodium 139 135 - 145 mmol/L   Potassium 4.4 3.5 - 5.1 mmol/L   Chloride 106 101 - 111 mmol/L   CO2 25 22 - 32 mmol/L   Glucose, Bld 129 (H) 65 - 99 mg/dL   BUN 15 6 - 20 mg/dL   Creatinine, Ser 0.84 0.44 - 1.00 mg/dL   Calcium 9.5 8.9 - 10.3 mg/dL   GFR calc non Af Amer >60 >60 mL/min   GFR calc Af Amer >60 >60 mL/min    Comment: (NOTE) The eGFR has been calculated using the CKD EPI equation. This calculation has not been validated in all clinical situations. eGFR's persistently <60 mL/min signify possible Chronic Kidney Disease.    Anion gap 8 5 - 15  CBC     Status: None   Collection Time: 12/19/17  8:10 AM  Result Value Ref Range   WBC 4.6 4.0 - 10.5 K/uL   RBC 3.97 3.87 - 5.11 MIL/uL   Hemoglobin 12.3 12.0 - 15.0 g/dL   HCT 38.6 36.0 - 46.0 %   MCV 97.2 78.0 - 100.0 fL   MCH 31.0 26.0 - 34.0 pg   MCHC 31.9 30.0 - 36.0 g/dL   RDW 13.0 11.5 - 15.5 %   Platelets 204 150 - 400 K/uL     HEENT: mild left facial droop Cardio: RRR and no murmur Resp: CTA B/L and unlabored GI: BS positive and NT, ND Extremity:  No Edema Skin:   Other IV site CDI left forearm, ecchymosis B forearms Neuro: Alert/Oriented, Cranial Nerve Abnormalities Left central VII, Abnormal Motor 4+/5 in BUE and BLE except 3+ R ankle DF and Dysarthric Musc/Skel:  Other no pain with UE or LE AROM Gen NAD   Assessment/Plan: 1. Functional deficits secondary to Right MCA infarct with worsened gait and dysarthria and  dysphagia, ADL status has declined as well which require 3+ hours per day of interdisciplinary therapy in a comprehensive inpatient rehab setting. Physiatrist is providing close team supervision and 24 hour management of active medical problems listed below. Physiatrist and rehab team continue to assess barriers to discharge/monitor patient progress toward functional and medical goals. FIM: Function - Bathing Bathing activity did not occur: Refused Position: Shower Body parts bathed by patient: Right arm, Left arm, Chest, Abdomen, Front perineal area, Buttocks, Right upper leg, Left upper leg Body parts bathed by helper: Right lower leg, Left lower leg, Back Assist Level: Touching or steadying assistance(Pt > 75%)  Function- Upper Body Dressing/Undressing What is the patient wearing?: Bra, Pull over shirt/dress Bra - Perfomed by patient: Thread/unthread right bra strap, Thread/unthread left bra strap Bra - Perfomed by helper: Hook/unhook bra (pull down sports bra) Pull over shirt/dress - Perfomed by patient: Thread/unthread right sleeve, Thread/unthread left sleeve, Put head through opening, Pull shirt over trunk Pull over shirt/dress - Perfomed by helper: Pull shirt over trunk Assist Level: Touching or steadying assistance(Pt > 75%) Function - Lower Body Dressing/Undressing What is the patient wearing?: Pants,  Shoes Position: Wheelchair/chair at sink Pants- Performed by patient: Thread/unthread right pants leg, Thread/unthread left pants leg, Pull pants up/down Pants- Performed by helper: Thread/unthread right pants leg, Thread/unthread left pants leg, Pull pants up/down Non-skid slipper socks- Performed by helper: Don/doff right sock, Don/doff left sock Socks - Performed by helper: Don/doff right sock, Don/doff left sock Shoes - Performed by patient: Don/doff left shoe, Don/doff right shoe(Slip on shoes) Shoes - Performed by helper: Fasten right, Fasten left TED Hose - Performed by  helper: Don/doff right TED hose, Don/doff left TED hose Assist for footwear: Partial/moderate assist Assist for lower body dressing: Touching or steadying assistance (Pt > 75%)  Function - Toileting Toileting activity did not occur: No continent bowel/bladder event Toileting steps completed by patient: Adjust clothing prior to toileting, Adjust clothing after toileting, Performs perineal hygiene Toileting steps completed by helper: Performs perineal hygiene Toileting Assistive Devices: Grab bar or rail Assist level: Touching or steadying assistance (Pt.75%)  Function - Air cabin crew transfer assistive device: Elevated toilet seat/BSC over toilet, Grab bar, Walker Assist level to toilet: Touching or steadying assistance (Pt > 75%) Assist level from toilet: Touching or steadying assistance (Pt > 75%)  Function - Chair/bed transfer Chair/bed transfer method: Ambulatory Chair/bed transfer assist level: Supervision or verbal cues Chair/bed transfer assistive device: Armrests, Other(rollator) Chair/bed transfer details: Tactile cues for sequencing, Tactile cues for initiation, Verbal cues for technique, Verbal cues for precautions/safety  Function - Locomotion: Wheelchair Will patient use wheelchair at discharge?: No Type: Manual Max wheelchair distance: 40' Assist Level: Supervision or verbal cues Wheel 50 feet with 2 turns activity did not occur: Safety/medical concerns Wheel 150 feet activity did not occur: Safety/medical concerns Turns around,maneuvers to table,bed, and toilet,negotiates 3% grade,maneuvers on rugs and over doorsills: No Function - Locomotion: Ambulation Assistive device: Other (comment)(rollator) Max distance: 138f Assist level: Supervision or verbal cues Assist level: Supervision or verbal cues Assist level: Supervision or verbal cues Walk 150 feet activity did not occur: Safety/medical concerns Assist level: Supervision or verbal cues Walk 10 feet on  uneven surfaces activity did not occur: Safety/medical concerns Assist level: Touching or steadying assistance (Pt > 75%)  Function - Comprehension Comprehension: Auditory Comprehension assist level: Follows basic conversation/direction with no assist  Function - Expression Expression: Verbal Expression assist level: Expresses basic needs/ideas: With extra time/assistive device  Function - Social Interaction Social Interaction assist level: Interacts appropriately 90% of the time - Needs monitoring or encouragement for participation or interaction.  Function - Problem Solving Problem solving assist level: Solves basic 90% of the time/requires cueing < 10% of the time  Function - Memory Memory assist level: Recognizes or recalls 90% of the time/requires cueing < 10% of the time Patient normally able to recall (first 3 days only): Current season, Location of own room, Staff names and faces, That he or she is in a hospital  Medical Problem List and Plan: 1.  Weakness, dysphagia, limitations with mobility and ADLs secondary to suspected R-MCA embolic stroke.Cont CIR PT, OT, SLP2.  DVT Prophylaxis/Anticoagulation: Pharmaceutical: Lovenox, PLT 163 Still with large orthostatic drops, give IVF and adjust meds, plan on D/C in am 1/9 3. Peripheral neuropathy/Pain Management: Resume bedtime tylenol. Will schedule home dose Neurontin to help with insomnia as well as BLE neuropathy (used prn at home).  4. Mood: LCSW to follow for evaluation and support.  5. Neuropsych: This patient is not fully capable of making decisions on her own behalf. 6. Skin/Wound Care: routine pressure relief measures.  7. Fluids/Electrolytes/Nutrition: Monitor I/O. Encourage fluid intake--renal status WNL. Po fluid ~476m diet upgraded Will d/c IVF             8. HTN: Avoid hypotension to allow for adequate perfusion higher systolic this am, reduced Imdur to daily 1/6 Vitals:   12/19/17 1500 12/20/17 0054  BP:  (!)  144/52  Pulse:  65  Resp:  20  Temp:  98 F (36.7 C)  SpO2: 100% 98%  Still with orthostatic drop, will give 2539m.9NS bolus over 2 hours 9. CAD: On metoprolol will reduce to 12.27m23m Crestor and Imdur ( reduce to 1m11mily).  10 PAF: To follow up with cardiology for TEE/loop recorder after discharge. On plavix for CVA prophyllaxis 11. GERD: Resume PPI.  12. Spinal stenosis with right foot drop: Neurontin at bedtime prn neuropathy. She does not like to use her AFO.  13. ABLA: Repeat CBC stable Hgb 11 14. Constipation: continue Senna 2  bid.     LOS (Days) 8 A FACE TO FACE EVALUATION WAS PERFORMED  AndrCharlett Blake/2019, 7:10 AM

## 2017-12-20 NOTE — Progress Notes (Signed)
Social Work  Discharge Note  The overall goal for the admission was met for:   Discharge location: Yes-[facility]  Length of Stay: NO-10 [place] ISSUES  Discharge activity level: Yes-SUPERVISION-MIN ASSIST LEVEL  Home/community participation: Yes  Services provided included: MD, RD, PT, OT, SLP, RN, CM, Pharmacy and SW  Financial Services: Private Insurance: [facility]  Follow-up services arranged: Home Health: [facility]-PT,OT,SP, DME: [facility]-BEDSIDE COMMODE and Patient/Family request agency HH: PREF HAD BEFORE, DME: USED BEFORE  Comments (or additional information):PT HAS HIRED ASSIST AND WILL HAVE 24 HR CARE UPON DISCHARGE. LIVES WITH DAUGHTER AND SHE IS IN CHARGE OF MOM'S CARE.  Patient/Family verbalized understanding of follow-up arrangements: Yes  Individual responsible for coordination of the follow-up plan: [name]-DAUGHTER  Confirmed correct DME delivered: Elease Hashimoto 12/20/2017    Elease Hashimoto

## 2017-12-21 DIAGNOSIS — I951 Orthostatic hypotension: Secondary | ICD-10-CM

## 2017-12-21 MED ORDER — CALCIUM CARBONATE-VITAMIN D 500-200 MG-UNIT PO TABS: 1.0000 | ORAL_TABLET | Freq: Two times a day (BID) | ORAL | 0 refills | Status: AC

## 2017-12-21 MED ORDER — METOPROLOL SUCCINATE ER 25 MG PO TB24: 12.5000 mg | ORAL_TABLET | Freq: Every day | ORAL | 3 refills | Status: DC

## 2017-12-21 MED ORDER — ISOSORBIDE MONONITRATE ER 30 MG PO TB24: 30.0000 mg | ORAL_TABLET | Freq: Every day | ORAL | 3 refills | Status: DC

## 2017-12-21 MED ORDER — POLYETHYLENE GLYCOL 3350 17 G PO PACK: 17.0000 g | PACK | Freq: Every day | ORAL | 0 refills | Status: DC | PRN

## 2017-12-21 MED ORDER — PANTOPRAZOLE SODIUM 40 MG PO TBEC: 40.0000 mg | DELAYED_RELEASE_TABLET | Freq: Every day | ORAL | 0 refills | Status: AC

## 2017-12-21 MED ORDER — CLOPIDOGREL BISULFATE 75 MG PO TABS: 75.0000 mg | ORAL_TABLET | Freq: Every day | ORAL | 0 refills | Status: DC

## 2017-12-21 NOTE — Progress Notes (Addendum)
Subjective/Complaints: No issues overnite discussed d/c plans with family, recorded po was only 227m but family states that an additional 10029m+ was given by them ROS:  No CP, SOB, +nocturia last noc Objective: Vital Signs: Blood pressure (!) 138/57, pulse (!) 58, temperature 98.2 F (36.8 C), temperature source Oral, resp. rate 18, height 5' 2"  (1.575 m), weight 71.7 kg (158 lb 1.1 oz), SpO2 97 %. No results found. Results for orders placed or performed during the hospital encounter of 12/12/17 (from the past 72 hour(s))  Basic metabolic panel     Status: Abnormal   Collection Time: 12/19/17  8:10 AM  Result Value Ref Range   Sodium 139 135 - 145 mmol/L   Potassium 4.4 3.5 - 5.1 mmol/L   Chloride 106 101 - 111 mmol/L   CO2 25 22 - 32 mmol/L   Glucose, Bld 129 (H) 65 - 99 mg/dL   BUN 15 6 - 20 mg/dL   Creatinine, Ser 0.84 0.44 - 1.00 mg/dL   Calcium 9.5 8.9 - 10.3 mg/dL   GFR calc non Af Amer >60 >60 mL/min   GFR calc Af Amer >60 >60 mL/min    Comment: (NOTE) The eGFR has been calculated using the CKD EPI equation. This calculation has not been validated in all clinical situations. eGFR's persistently <60 mL/min signify possible Chronic Kidney Disease.    Anion gap 8 5 - 15  CBC     Status: None   Collection Time: 12/19/17  8:10 AM  Result Value Ref Range   WBC 4.6 4.0 - 10.5 K/uL   RBC 3.97 3.87 - 5.11 MIL/uL   Hemoglobin 12.3 12.0 - 15.0 g/dL   HCT 38.6 36.0 - 46.0 %   MCV 97.2 78.0 - 100.0 fL   MCH 31.0 26.0 - 34.0 pg   MCHC 31.9 30.0 - 36.0 g/dL   RDW 13.0 11.5 - 15.5 %   Platelets 204 150 - 400 K/uL     HEENT: mild left facial droop Cardio: RRR and no murmur Resp: CTA B/L and unlabored GI: BS positive and NT, ND Extremity:  No Edema Skin:   Other IV site CDI left forearm, ecchymosis B forearms Neuro: Alert/Oriented, Cranial Nerve Abnormalities Left central VII, Abnormal Motor 4+/5 in BUE and BLE except 3+ R ankle DF and Dysarthric Musc/Skel:  Other no pain  with UE or LE AROM Gen NAD   Assessment/Plan: 1. Functional deficits secondary to Right MCA infarct  Stable for D/C today F/u PCP in 3-4 weeks F/u PM&R 2 weeks See D/C summary See D/C instructions Function - Bathing Bathing activity did not occur: Refused Position: Shower Body parts bathed by patient: Right arm, Left arm, Chest, Abdomen, Front perineal area, Buttocks, Right upper leg, Left upper leg Body parts bathed by helper: Right lower leg, Left lower leg, Back Assist Level: Touching or steadying assistance(Pt > 75%)  Function- Upper Body Dressing/Undressing Upper body dressing/undressing activity did not occur: N/A What is the patient wearing?: Bra, Pull over shirt/dress Bra - Perfomed by patient: Thread/unthread right bra strap, Thread/unthread left bra strap, Hook/unhook bra (pull down sports bra) Bra - Perfomed by helper: Hook/unhook bra (pull down sports bra) Pull over shirt/dress - Perfomed by patient: Thread/unthread right sleeve, Thread/unthread left sleeve, Put head through opening, Pull shirt over trunk Pull over shirt/dress - Perfomed by helper: Pull shirt over trunk Assist Level: Touching or steadying assistance(Pt > 75%) Function - Lower Body Dressing/Undressing What is the patient wearing?: Pants, Shoes Position: Wheelchair/chair  at sink Pants- Performed by patient: Thread/unthread right pants leg, Thread/unthread left pants leg, Pull pants up/down Pants- Performed by helper: Thread/unthread right pants leg, Thread/unthread left pants leg, Pull pants up/down Non-skid slipper socks- Performed by helper: Don/doff right sock, Don/doff left sock Socks - Performed by helper: Don/doff right sock, Don/doff left sock Shoes - Performed by patient: Don/doff left shoe, Don/doff right shoe(Slip on shoes) Shoes - Performed by helper: Fasten right, Fasten left TED Hose - Performed by helper: Don/doff right TED hose, Don/doff left TED hose Assist for footwear: Maximal  assist Assist for lower body dressing: Touching or steadying assistance (Pt > 75%)  Function - Toileting Toileting activity did not occur: No continent bowel/bladder event Toileting steps completed by patient: Adjust clothing prior to toileting, Adjust clothing after toileting Toileting steps completed by helper: Adjust clothing prior to toileting, Adjust clothing after toileting Toileting Assistive Devices: Grab bar or rail, Toilet aid Assist level: More than reasonable time, Supervision or verbal cues  Function - Toilet Transfers Toilet transfer assistive device: Elevated toilet seat/BSC over toilet, Grab bar, Walker Assist level to toilet: Touching or steadying assistance (Pt > 75%) Assist level from toilet: Touching or steadying assistance (Pt > 75%)  Function - Chair/bed transfer Chair/bed transfer method: Ambulatory Chair/bed transfer assist level: Supervision or verbal cues Chair/bed transfer assistive device: Armrests, Other(rollator) Chair/bed transfer details: Tactile cues for sequencing, Tactile cues for initiation, Verbal cues for technique, Verbal cues for precautions/safety  Function - Locomotion: Wheelchair Will patient use wheelchair at discharge?: No Type: Manual Max wheelchair distance: 40' Assist Level: Supervision or verbal cues Wheel 50 feet with 2 turns activity did not occur: Safety/medical concerns Wheel 150 feet activity did not occur: Safety/medical concerns Turns around,maneuvers to table,bed, and toilet,negotiates 3% grade,maneuvers on rugs and over doorsills: No Function - Locomotion: Ambulation Assistive device: Other (comment)(rollator) Max distance: 181f Assist level: Supervision or verbal cues Assist level: Supervision or verbal cues Assist level: Supervision or verbal cues Walk 150 feet activity did not occur: Safety/medical concerns Assist level: Supervision or verbal cues Walk 10 feet on uneven surfaces activity did not occur: Safety/medical  concerns Assist level: Touching or steadying assistance (Pt > 75%)  Function - Comprehension Comprehension: Auditory Comprehension assist level: Follows basic conversation/direction with no assist  Function - Expression Expression: Verbal Expression assist level: Expresses basic needs/ideas: With extra time/assistive device  Function - Social Interaction Social Interaction assist level: Interacts appropriately 90% of the time - Needs monitoring or encouragement for participation or interaction.  Function - Problem Solving Problem solving assist level: Solves basic 90% of the time/requires cueing < 10% of the time  Function - Memory Memory assist level: Recognizes or recalls 90% of the time/requires cueing < 10% of the time Patient normally able to recall (first 3 days only): Current season, Location of own room, Staff names and faces, That he or she is in a hospital  Medical Problem List and Plan: 1.  Weakness, dysphagia, limitations with mobility and ADLs secondary to suspected R-MCA embolic stroke.Stable for D/C    2.  DVT Prophylaxis/Anticoagulation: Pharmaceutical: Lovenox, PLT 163  3. Peripheral neuropathy/Pain Management: Resume bedtime tylenol. Will schedule home dose Neurontin to help with insomnia as well as BLE neuropathy (used prn at home).  4. Mood: LCSW to follow for evaluation and support.  5. Neuropsych: This patient is not fully capable of making decisions on her own behalf. 6. Skin/Wound Care: routine pressure relief measures.  7. Fluids/Electrolytes/Nutrition: Monitor I/O. Encourage fluid intake--renal status  WNL. Po fluid ~459m diet upgraded Will d/c IVF             8. HTN: Avoid hypotension to allow for adequate perfusion higher systolic this am, reduced Imdur to daily 1/6 Vitals:   12/20/17 2111 12/21/17 0304  BP: (!) 138/53 (!) 138/57  Pulse: (!) 56 (!) 58  Resp: 18 18  Temp:  98.2 F (36.8 C)  SpO2: 100% 97%  improved 9. CAD: On metoprolol will reduce  to 12.524m, Crestor and Imdur ( reduce to 3044maily).  10 PAF: To follow up with cardiology for TEE/loop recorder after discharge. On plavix for CVA prophyllaxis 11. GERD: Resume PPI.  12. Spinal stenosis with right foot drop: Neurontin at bedtime prn neuropathy. She does not like to use her AFO.  13. ABLA: Repeat CBC stable Hgb 11 14. Constipation: continue Senna 2  bid.     LOS (Days) 9 A FACE TO FACE EVALUATION WAS PERFORMED  AndCharlett Blake9/2019, 8:36 AM

## 2017-12-21 NOTE — Discharge Instructions (Signed)
Inpatient Rehab Discharge Instructions  Molly [name] Discharge date and time:   12/21/16  Activities/Precautions/ Functional Status: Activity: activity as tolerated Diet: Soft foods. Needs to drink at least 1500 cc fluids daily.  Wound Care: none needed    Functional status:  ___ No restrictions     ___ Walk up steps independently _X__ 24/7 supervision/assistance   ___ Walk up steps with assistance ___ Intermittent supervision/assistance  ___ Bathe/dress independently ___ Walk with walker     ___ Bathe/dress with assistance ___ Walk Independently    ___ Shower independently ___ Walk with assistance    _X__ Shower with assistance _X__ No alcohol     ___ Return to work/school ________   Special Instructions: 1. Take your time with positional changes. Sit for a few minutes before standing.  2. Call cardiology office for appointment if you have not contacted you by Monday.  3. Note medications changes.   COMMUNITY REFERRALS UPON DISCHARGE:    Home Health:   PT, OT, Hooks   Date of last service:12/20/2017  Medical Equipment/Items Ordered:BEDSIDE COMMODE  Agency/Supplier:[facility]   [id number]   GENERAL COMMUNITY RESOURCES FOR PATIENT/FAMILY: Support Groups:[facility] SUPPORT GROUP  EVERY SECOND Thursday @ 3:00-4:00 PM ON THE REHAB UNIT QUESTIONS CONTACT [place] [phone]   STROKE/TIA DISCHARGE INSTRUCTIONS SMOKING Cigarette smoking nearly doubles your risk of having a stroke & is the single most alterable risk factor  If you smoke or have smoked in the last 12 months, you are advised to quit smoking for your health.  Most of the excess cardiovascular risk related to smoking disappears within a year of stopping.  Ask you doctor about anti-smoking medications  [place] Quit Line: 1-800-QUIT NOW  Free Smoking Cessation Classes [phone]  CHOLESTEROL Know your levels; limit fat & cholesterol in your diet  Lipid Panel       Component Value Date/Time   CHOL 133 12/08/2017 0309   TRIG 98 12/08/2017 0309   HDL 63 12/08/2017 0309   CHOLHDL 2.1 12/08/2017 0309   VLDL 20 12/08/2017 0309   LDLCALC 50 12/08/2017 0309      Many patients benefit from treatment even if their cholesterol is at goal.  Goal: Total Cholesterol (CHOL) less than 160  Goal:  Triglycerides (TRIG) less than 150  Goal:  HDL greater than 40  Goal:  LDL (LDLCALC) less than 100   BLOOD PRESSURE American Stroke Association blood pressure target is less that 120/80 mm/Hg  Your discharge blood pressure is:  BP: 108/61  Monitor your blood pressure  Limit your salt and alcohol intake  Many individuals will require more than one medication for high blood pressure  DIABETES (A1c is a blood sugar average for last 3 months) Goal HGBA1c is under 7% (HBGA1c is blood sugar average for last 3 months)  Diabetes: No known diagnosis of diabetes    Lab Results  Component Value Date   HGBA1C 5.7 (H) 12/08/2017     Your HGBA1c can be lowered with medications, healthy diet, and exercise.  Check your blood sugar as directed by your physician  Call your physician if you experience unexplained or low blood sugars.  PHYSICAL ACTIVITY/REHABILITATION Goal is 30 minutes at least 4 days per week  Activity: No driving, Therapies: see above Return to work: N/A  Activity decreases your risk of heart attack and stroke and makes your heart stronger.  It helps control your weight and blood pressure; helps you relax and  can improve your mood.  Participate in a regular exercise program.  Talk with your doctor about the best form of exercise for you (dancing, walking, swimming, cycling).  DIET/WEIGHT Goal is to maintain a healthy weight  Your discharge diet is: DIET DYS 2 Room service appropriate? Yes; Fluid consistency: Thin  liquids Your height is:  Height: 5\' 2"  (157.5 cm) Your current weight is: Weight: 73.7 kg (162 lb 7.7 oz) Your Body Mass Index  (BMI) is:  BMI (Calculated): 29.71  Following the type of diet specifically designed for you will help prevent another stroke.  Your goal weight  is: 134 lbs  Your goal Body Mass Index (BMI) is 19-24.  Healthy food habits can help reduce 3 risk factors for stroke:  High cholesterol, hypertension, and excess weight.  RESOURCES Stroke/Support Group:  Call [phone]   STROKE EDUCATION PROVIDED/REVIEWED AND GIVEN TO PATIENT Stroke warning signs and symptoms How to activate emergency medical system (call 911). Medications prescribed at discharge. Need for follow-up after discharge. Personal risk factors for stroke. Pneumonia vaccine given:  Flu vaccine given:  My questions have been answered, the writing is legible, and I understand these instructions.  I will adhere to these goals & educational materials that have been provided to me after my discharge from the hospital.     My questions have been answered and I understand these instructions. I will adhere to these goals and the provided educational materials after my discharge from the hospital.  Patient/Caregiver Signature _______________________________ Date __________  Clinician Signature _______________________________________ Date __________  Please bring this form and your medication list with you to all your follow-up doctor's appointments.

## 2017-12-21 NOTE — Progress Notes (Signed)
Patient discharged to home accompanied by dtr. Discharge instructions given by Algis Liming PAC, no questions noted. Discharged with belongings via wheelchair by NT. Margarito Liner

## 2017-12-22 ENCOUNTER — Telehealth: Payer: Self-pay | Admitting: *Deleted

## 2017-12-22 NOTE — Telephone Encounter (Signed)
Transitional care call completed, Appointment confirmed, address confirmed,  New patient packet sent  Transitional Care Questions   Questions for our staff to ask patients on Transitional care 48 hour phone call:   1. Are you/is patient experiencing any problems since coming home? No  Are there any questions regarding any aspect of care?  No  2. Are there any questions regarding medications administration/dosing? No  Are meds being taken as prescribed? Yes  Patient should review meds with caller to confirm   3. Have there been any falls?  No  4. Has Home Health been to the house and/or have they contacted you?[facility] been to the house, PT and OT will be coming soon If not, have you tried to contact them? Can we help you contact them?   5. Are bowels and bladder emptying properly? Yes  Are there any unexpected incontinence issues? No If applicable, is patient following bowel/bladder programs?   6. Any fevers, problems with breathing, unexpected pain?  No, No, No  7. Are there any skin problems or new areas of breakdown? Patient received fingernail scratch from a nurse during IV insertion/extraction. Patients caregiver advised to follow up with PCP  8. Has the patient/family member arranged specialty MD follow up (ie cardiology/neurology/renal/surgical/etc)?  Yes  Can we help arrange?  no  9. Does the patient need any other services or support that we can help arrange?  No  10. Are caregivers following through as expected in assisting the patient?  So far so good, been a little rough here and there.  I advised pt's caregiver to have that conversation with the provider.  Perhaps [place] may be an option  11. Has the patient quit smoking, drinking alcohol, or using drugs as recommended?  No smoke, No drink, No drugs

## 2017-12-27 ENCOUNTER — Telehealth: Payer: Self-pay

## 2017-12-27 NOTE — Telephone Encounter (Signed)
Henderson Newcomer OT with [facility] is requesting verbal orders for 2 times a wk for 2 wks, and 1 time a wk for 2 wks.

## 2017-12-27 NOTE — Telephone Encounter (Signed)
I left a message with Molly [name] on her confidential voice mail giving verbal orders per office protocol.

## 2017-12-30 NOTE — Progress Notes (Signed)
ELECTROPHYSIOLOGY CONSULT NOTE  Patient ID: Molly [name] MRN: [id number], DOB/AGE: June 20, 1929  Date of Consult: 01/04/2018  Primary Physician: [name], MD Primary Cardiologist: Irish Lack Reason for Consultation: Cryptogenic stroke; recommendations regarding Implantable Loop Recorder  History of Present Illness EP has been asked to evaluate [name] for placement of an implantable loop recorder to monitor for atrial fibrillation by Dr Erlinda Hong.  The patient was admitted last month with left sided weakness.   Imaging demonstrated acute stroke felt to be embolic 2/2 unknown source.  She has undergone workup for stroke including echocardiogram and carotid dopplers.  The patient was monitored on telemetry which has demonstrated sinus rhythm with no arrhythmias.  TEE and ILR have been recommended by Dr Erlinda Hong to complete work up.  There is mention of AF in hospital notes, but the patient is unaware of this diagnosis.  She did have prior epidural hematoma in the setting of DVT post back surgery. She has not had bleeding issues since.  She continues to recover from stroke but is somewhat depressed about inability to do what she wants to do.   The patient denies chest pain, shortness of breath, dizziness, palpitations, or syncope.    Past Medical History:  Diagnosis Date  . Acid reflux   . Anemia   . Arthritis   . CAD (coronary artery disease)   . High cholesterol   . Hyperlipidemia   . Hypertension   . Osteoporosis   . Spinal stenosis      Surgical History:  Past Surgical History:  Procedure Laterality Date  . ABDOMINAL HYSTERECTOMY    . BACK SURGERY     Rod and Pins Placed   . CARDIAC CATHETERIZATION N/A 11/17/2015   Procedure: Left Heart Cath and Coronary Angiography;  Surgeon: Jettie Booze, MD;  Location: [facility];  Service: Cardiovascular;  Laterality: N/A;  . CAROTID STENT        (Not in a hospital admission)  Inpatient Medications:   Allergies:   Allergies  Allergen Reactions  . Buprenorphine Hcl Shortness Of Breath    Chest pain  . Codeine Nausea And Vomiting  . Hydrocodone Nausea And Vomiting  . Morphine And Related Shortness Of Breath    Chest pain  . Sulfa Antibiotics Nausea And Vomiting  . Sulfacetamide Sodium Nausea And Vomiting  . Vicodin [Hydrocodone-Acetaminophen] Nausea And Vomiting  . Evista [Raloxifene] Other (See Comments) and Nausea And Vomiting    Upset stomach  . Hydromorphone Nausea And Vomiting    Sensitive  . Other Other (See Comments)    Narcotic-like medications - does NOT tolerate pain meds, causes unrelating volatile vomiting and drops in BP below 80/40 with dizziness and fainting  Tourniquet - Please place tourniquet over cloth - causes sever bruising.  Blood pressure cuff - please use manual blood pressure cuff - causes excoriating pain using machine and bruising     . Sulfamethoxazole Nausea And Vomiting  . Sulfamethoxazole-Trimethoprim Nausea And Vomiting  . Sulfur Nausea And Vomiting    Vomited until bleeding occurred and developed an active stomach ulcer  . Tape Other (See Comments)    Rips skin  . Morphine Nausea Only    Made me crazy    Social History   Socioeconomic History  . Marital status: Married    Spouse name: Not on file  . Number of children: Not on file  . Years of education: Not on file  . Highest education level: Not on file  Social Needs  .  Financial resource strain: Not on file  . Food insecurity - worry: Not on file  . Food insecurity - inability: Not on file  . Transportation needs - medical: Not on file  . Transportation needs - non-medical: Not on file  Occupational History  . Not on file  Tobacco Use  . Smoking status: Never Smoker  . Smokeless tobacco: Never Used  Substance and Sexual Activity  . Alcohol use: No    Alcohol/week: 0.0 oz  . Drug use: No  . Sexual activity: Not on file  Other Topics Concern  . Not on file  Social History Narrative  .  Not on file     Family History  Problem Relation Age of Onset  . Anemia Mother   . Heart attack Brother       Review of Systems: All other systems reviewed and are otherwise negative except as noted above.  Physical Exam: Vitals:   01/04/18 0946  BP: 120/70  Pulse: 65  SpO2: 99%  Weight: 159 lb 12.8 oz (72.5 kg)  Height: 5\' 2"  (1.575 m)    GEN- The patient is elderly and kyphotic appearing, alert and oriented x 3 today.   Head- normocephalic, atraumatic Eyes-  Sclera clear, conjunctiva pink Ears- hearing intact Oropharynx- clear Neck- supple Lungs- Clear to ausculation bilaterally, normal work of breathing Heart- Regular rate and rhythm GI- soft, NT, ND, + BS Extremities- no clubbing, cyanosis, or edema MS- no significant deformity or atrophy Skin- no rash or lesion Psych- euthymic mood, full affect   Labs:   Lab Results  Component Value Date   WBC 4.6 12/19/2017   HGB 12.3 12/19/2017   HCT 38.6 12/19/2017   MCV 97.2 12/19/2017   PLT 204 12/19/2017   No results for input(s): NA, K, CL, CO2, BUN, CREATININE, CALCIUM, PROT, BILITOT, ALKPHOS, ALT, AST, GLUCOSE in the last 168 hours.  Invalid input(s): LABALBU   Radiology/Studies: Ct Angio Head W Or Wo Contrast  Result Date: 12/07/2017 CLINICAL DATA:  Left arm weakness.  Aphasia. EXAM: CT ANGIOGRAPHY HEAD AND NECK CT PERFUSION BRAIN TECHNIQUE: Multidetector CT imaging of the head and neck was performed using the standard protocol during bolus administration of intravenous contrast. Multiplanar CT image reconstructions and MIPs were obtained to evaluate the vascular anatomy. Carotid stenosis measurements (when applicable) are obtained utilizing NASCET criteria, using the distal internal carotid diameter as the denominator. Multiphase CT imaging of the brain was performed following IV bolus contrast injection. Subsequent parametric perfusion maps were calculated using RAPID software. CONTRAST:  19mL ISOVUE-370  IOPAMIDOL (ISOVUE-370) INJECTION 76% COMPARISON:  Noncontrast head CT earlier today. Cervical spine CT 01/03/2014. No prior angiographic imaging. FINDINGS: CTA NECK FINDINGS Aortic arch: Normal variant 4 vessel aortic arch with the left vertebral artery arising from the arch between the left common carotid and left subclavian origins. Moderate arch atherosclerosis. No evidence of significant arch vessel origins stenosis. Right carotid system: Patent without evidence of stenosis or dissection. Mild calcified plaque at the carotid bifurcation. Partially retropharyngeal course of the common and internal carotid arteries. Tortuous proximal common carotid artery. Left carotid system: Patent without evidence of stenosis or dissection. Mild calcified plaque at the carotid bifurcation. Tortuous common carotid artery. Vertebral arteries: Patent without evidence of stenosis or dissection. Non stenotic calcified plaque at the left vertebral artery origin. Moderately dominant right vertebral artery. Skeleton: Grade 1 anterolisthesis of C4 on C5 due to advanced facet arthrosis, with facet ankylosis noted bilaterally. Advanced disc degeneration at C5-6 and C6-7.  Interbody ankylosis at C6-7. Other neck: 1.6 cm low-density left thyroid nodule, stable to minimally larger than on the 2015 cervical spine CT. Upper chest: Clear lung apices. Review of the MIP images confirms the above findings CTA HEAD FINDINGS Anterior circulation: The internal carotid arteries are patent from skullbase to carotid termini with mild siphon atherosclerosis bilaterally but no significant stenosis. Both ICAs are slightly ectatic in appearance likely reflecting chronic hypertension. MCAs are patent without evidence of M1 stenosis or proximal branch occlusion. There is a severe stenosis of a mid right M2 branch vessel. ACAs are patent without evidence of significant proximal stenosis. No aneurysm. Posterior circulation: The intracranial vertebral arteries  are patent to the basilar with the right being dominant and mildly ectatic. There is mild bilateral V4 segment atherosclerosis without significant stenosis. PICA origins are not clearly identified. Patent right AICA and bilateral SCA origins are visualized. The basilar artery is widely patent. Posterior communicating arteries are not identified and may be diminutive or absent. PCAs are patent without evidence of significant proximal stenosis. No aneurysm. Venous sinuses: Grossly patent within limitations of arterial phase dominant contrast timing. Anatomic variants: None. Delayed phase: Not performed. Review of the MIP images confirms the above findings CT Brain Perfusion Findings: CBF (<30%) Volume:  0 mL Perfusion (Tmax>6.0s) volume:  9 mL Mismatch Volume:  9 mL Infarction Location: No core infarct evident by above CBF threshold. Delayed transit in the right frontal operculum with 5 mL volume of tissue demonstrating CBF <38%. IMPRESSION: 1. No emergent large vessel occlusion. 2. Small region of abnormal perfusion in the right frontal operculum without evidence of core infarct utilizing CBF <30% threshold. 3. Severe stenosis of a right MCA mid M2 branch vessel corresponding to the perfusion abnormality. 4. Mild cervical carotid artery atherosclerosis without stenosis. 5.  Aortic Atherosclerosis (ICD10-I70.0). Preliminary findings of no emergent large vessel occlusion were reviewed in person with Dr. Amie Portland on 12/07/2017 at 10:40 am. Electronically Signed   By: Logan Bores M.D.   On: 12/07/2017 11:03   Ct Angio Neck W Or Wo Contrast  Result Date: 12/07/2017 CLINICAL DATA:  Left arm weakness.  Aphasia. EXAM: CT ANGIOGRAPHY HEAD AND NECK CT PERFUSION BRAIN TECHNIQUE: Multidetector CT imaging of the head and neck was performed using the standard protocol during bolus administration of intravenous contrast. Multiplanar CT image reconstructions and MIPs were obtained to evaluate the vascular anatomy. Carotid  stenosis measurements (when applicable) are obtained utilizing NASCET criteria, using the distal internal carotid diameter as the denominator. Multiphase CT imaging of the brain was performed following IV bolus contrast injection. Subsequent parametric perfusion maps were calculated using RAPID software. CONTRAST:  73mL ISOVUE-370 IOPAMIDOL (ISOVUE-370) INJECTION 76% COMPARISON:  Noncontrast head CT earlier today. Cervical spine CT 01/03/2014. No prior angiographic imaging. FINDINGS: CTA NECK FINDINGS Aortic arch: Normal variant 4 vessel aortic arch with the left vertebral artery arising from the arch between the left common carotid and left subclavian origins. Moderate arch atherosclerosis. No evidence of significant arch vessel origins stenosis. Right carotid system: Patent without evidence of stenosis or dissection. Mild calcified plaque at the carotid bifurcation. Partially retropharyngeal course of the common and internal carotid arteries. Tortuous proximal common carotid artery. Left carotid system: Patent without evidence of stenosis or dissection. Mild calcified plaque at the carotid bifurcation. Tortuous common carotid artery. Vertebral arteries: Patent without evidence of stenosis or dissection. Non stenotic calcified plaque at the left vertebral artery origin. Moderately dominant right vertebral artery. Skeleton: Grade 1 anterolisthesis of  C4 on C5 due to advanced facet arthrosis, with facet ankylosis noted bilaterally. Advanced disc degeneration at C5-6 and C6-7. Interbody ankylosis at C6-7. Other neck: 1.6 cm low-density left thyroid nodule, stable to minimally larger than on the 2015 cervical spine CT. Upper chest: Clear lung apices. Review of the MIP images confirms the above findings CTA HEAD FINDINGS Anterior circulation: The internal carotid arteries are patent from skullbase to carotid termini with mild siphon atherosclerosis bilaterally but no significant stenosis. Both ICAs are slightly ectatic  in appearance likely reflecting chronic hypertension. MCAs are patent without evidence of M1 stenosis or proximal branch occlusion. There is a severe stenosis of a mid right M2 branch vessel. ACAs are patent without evidence of significant proximal stenosis. No aneurysm. Posterior circulation: The intracranial vertebral arteries are patent to the basilar with the right being dominant and mildly ectatic. There is mild bilateral V4 segment atherosclerosis without significant stenosis. PICA origins are not clearly identified. Patent right AICA and bilateral SCA origins are visualized. The basilar artery is widely patent. Posterior communicating arteries are not identified and may be diminutive or absent. PCAs are patent without evidence of significant proximal stenosis. No aneurysm. Venous sinuses: Grossly patent within limitations of arterial phase dominant contrast timing. Anatomic variants: None. Delayed phase: Not performed. Review of the MIP images confirms the above findings CT Brain Perfusion Findings: CBF (<30%) Volume:  0 mL Perfusion (Tmax>6.0s) volume:  9 mL Mismatch Volume:  9 mL Infarction Location: No core infarct evident by above CBF threshold. Delayed transit in the right frontal operculum with 5 mL volume of tissue demonstrating CBF <38%. IMPRESSION: 1. No emergent large vessel occlusion. 2. Small region of abnormal perfusion in the right frontal operculum without evidence of core infarct utilizing CBF <30% threshold. 3. Severe stenosis of a right MCA mid M2 branch vessel corresponding to the perfusion abnormality. 4. Mild cervical carotid artery atherosclerosis without stenosis. 5.  Aortic Atherosclerosis (ICD10-I70.0). Preliminary findings of no emergent large vessel occlusion were reviewed in person with Dr. Amie Portland on 12/07/2017 at 10:40 am. Electronically Signed   By: Logan Bores M.D.   On: 12/07/2017 11:03   Mr Brain Wo Contrast  Result Date: 12/08/2017 CLINICAL DATA:  RIGHT-sided  weakness and speech difficulties. History of hypertension, hyperlipidemia. EXAM: MRI HEAD WITHOUT CONTRAST TECHNIQUE: Multiplanar, multiecho pulse sequences of the brain and surrounding structures were obtained without intravenous contrast. COMPARISON:  CT HEAD December 07, 2017 and MRI of the head Apr 26, 2007 FINDINGS: BRAIN: No reduced diffusion to suggest acute ischemia. No susceptibility artifact to suggest hemorrhage. The ventricles and sulci are normal for patient's age, cavum septum pellucidum et vergae . Minimal supratentorial white matter FLAIR T2 hyperintensities compatible with chronic small vessel ischemic disease, less than expected for age. No suspicious parenchymal signal, mass or mass effect. No abnormal extra-axial fluid collections. VASCULAR: Normal major intracranial vascular flow voids present at skull base, mild dolichoectasia is associated with chronic hypertension. SKULL AND UPPER CERVICAL SPINE: No abnormal sellar expansion. No suspicious calvarial bone marrow signal. Mildly increased T1 signal calvarium and included cervical spine compatible with osteopenia. Craniocervical junction maintained. SINUSES/ORBITS: The mastoid air-cells and included paranasal sinuses are well-aerated. The included ocular globes and orbital contents are non-suspicious. Status post bilateral ocular lens implants. OTHER: None. IMPRESSION: Negative noncontrast MRI of the head for age. Electronically Signed   By: Elon Alas M.D.   On: 12/08/2017 13:33   Ct Cerebral Perfusion W Contrast  Result Date: 12/07/2017 CLINICAL DATA:  Left arm weakness.  Aphasia. EXAM: CT ANGIOGRAPHY HEAD AND NECK CT PERFUSION BRAIN TECHNIQUE: Multidetector CT imaging of the head and neck was performed using the standard protocol during bolus administration of intravenous contrast. Multiplanar CT image reconstructions and MIPs were obtained to evaluate the vascular anatomy. Carotid stenosis measurements (when applicable) are  obtained utilizing NASCET criteria, using the distal internal carotid diameter as the denominator. Multiphase CT imaging of the brain was performed following IV bolus contrast injection. Subsequent parametric perfusion maps were calculated using RAPID software. CONTRAST:  25mL ISOVUE-370 IOPAMIDOL (ISOVUE-370) INJECTION 76% COMPARISON:  Noncontrast head CT earlier today. Cervical spine CT 01/03/2014. No prior angiographic imaging. FINDINGS: CTA NECK FINDINGS Aortic arch: Normal variant 4 vessel aortic arch with the left vertebral artery arising from the arch between the left common carotid and left subclavian origins. Moderate arch atherosclerosis. No evidence of significant arch vessel origins stenosis. Right carotid system: Patent without evidence of stenosis or dissection. Mild calcified plaque at the carotid bifurcation. Partially retropharyngeal course of the common and internal carotid arteries. Tortuous proximal common carotid artery. Left carotid system: Patent without evidence of stenosis or dissection. Mild calcified plaque at the carotid bifurcation. Tortuous common carotid artery. Vertebral arteries: Patent without evidence of stenosis or dissection. Non stenotic calcified plaque at the left vertebral artery origin. Moderately dominant right vertebral artery. Skeleton: Grade 1 anterolisthesis of C4 on C5 due to advanced facet arthrosis, with facet ankylosis noted bilaterally. Advanced disc degeneration at C5-6 and C6-7. Interbody ankylosis at C6-7. Other neck: 1.6 cm low-density left thyroid nodule, stable to minimally larger than on the 2015 cervical spine CT. Upper chest: Clear lung apices. Review of the MIP images confirms the above findings CTA HEAD FINDINGS Anterior circulation: The internal carotid arteries are patent from skullbase to carotid termini with mild siphon atherosclerosis bilaterally but no significant stenosis. Both ICAs are slightly ectatic in appearance likely reflecting chronic  hypertension. MCAs are patent without evidence of M1 stenosis or proximal branch occlusion. There is a severe stenosis of a mid right M2 branch vessel. ACAs are patent without evidence of significant proximal stenosis. No aneurysm. Posterior circulation: The intracranial vertebral arteries are patent to the basilar with the right being dominant and mildly ectatic. There is mild bilateral V4 segment atherosclerosis without significant stenosis. PICA origins are not clearly identified. Patent right AICA and bilateral SCA origins are visualized. The basilar artery is widely patent. Posterior communicating arteries are not identified and may be diminutive or absent. PCAs are patent without evidence of significant proximal stenosis. No aneurysm. Venous sinuses: Grossly patent within limitations of arterial phase dominant contrast timing. Anatomic variants: None. Delayed phase: Not performed. Review of the MIP images confirms the above findings CT Brain Perfusion Findings: CBF (<30%) Volume:  0 mL Perfusion (Tmax>6.0s) volume:  9 mL Mismatch Volume:  9 mL Infarction Location: No core infarct evident by above CBF threshold. Delayed transit in the right frontal operculum with 5 mL volume of tissue demonstrating CBF <38%. IMPRESSION: 1. No emergent large vessel occlusion. 2. Small region of abnormal perfusion in the right frontal operculum without evidence of core infarct utilizing CBF <30% threshold. 3. Severe stenosis of a right MCA mid M2 branch vessel corresponding to the perfusion abnormality. 4. Mild cervical carotid artery atherosclerosis without stenosis. 5.  Aortic Atherosclerosis (ICD10-I70.0). Preliminary findings of no emergent large vessel occlusion were reviewed in person with Dr. Amie Portland on 12/07/2017 at 10:40 am. Electronically Signed   By: Seymour Bars.D.  On: 12/07/2017 11:03   Dg Chest Port 1 View  Result Date: 12/07/2017 CLINICAL DATA:  Follow-up stroke.  Weakness and confusion. EXAM:  PORTABLE CHEST 1 VIEW COMPARISON:  03/03/2012. FINDINGS: Mediastinum and hilar structures normal. Lungs are clear. No pleural effusion or pneumothorax. Heart size normal. No acute bony abnormality identified. IMPRESSION: No acute cardiopulmonary disease. Electronically Signed   By: Marcello Moores  Register   On: 12/07/2017 13:25   Ct Head Code Stroke Wo Contrast  Result Date: 12/07/2017 CLINICAL DATA:  Code stroke. 84 year old female last known well at 0915 hours. Left arm weakness and drift. Aphasia. EXAM: CT HEAD WITHOUT CONTRAST TECHNIQUE: Contiguous axial images were obtained from the base of the skull through the vertex without intravenous contrast. COMPARISON:  Head and cervical spine CT 01/03/2014. FINDINGS: Brain: Cavum septum pellucidum normal variant. Cerebral volume is not significantly changed since 2015. No ventriculomegaly. No acute intracranial hemorrhage identified. No midline shift, mass effect, or evidence of intracranial mass lesion. Gray-white matter differentiation is stable and normal throughout the brain. No cortically based acute infarct identified. No cortical encephalomalacia. Vascular: Calcified atherosclerosis at the skull base. No suspicious intracranial vascular hyperdensity. Skull: No acute osseous abnormality identified. Sinuses/Orbits: Clear. Other: No acute orbit or scalp soft tissue findings. Postoperative changes to both globes. ASPECTS [facility] Stroke Program Early CT Score) - Ganglionic level infarction (caudate, lentiform nuclei, internal capsule, insula, M1-M3 cortex): 7 - Supraganglionic infarction (M4-M6 cortex): 3 Total score (0-10 with 10 being normal): 10 IMPRESSION: 1. Noncontrast CT appearance of the brain is stable since 2015 and normal for age. 2. ASPECTS is 10. 3. These results were communicated to Dr. Rory Percy at 1023 hours on 12/07/2017 by text page via the [facility] messaging system. Electronically Signed   By: Genevie Ann M.D.   On: 12/07/2017 10:23    12-lead ECG sinus  rhythm (personally reviewed) All prior EKG's in EPIC reviewed with no documented atrial fibrillation  Telemetry sinus rhythm (personally reviewed)  Assessment and Plan:  1. Cryptogenic stroke The patient presents has had a cryptogenic stroke.  TEE and ILR have been recommended by neurology to complete work up.  Discussed with patient and daughter today.  They would like to discuss further with other daughter and will call back if they wish to schedule.  Pt reviewed with Dr Rayann Heman today - if they would like to proceed, please schedule on a day that Dr Rayann Heman is in the hospital.     Chanetta Marshall, NP 01/04/2018 10:30 AM

## 2018-01-02 ENCOUNTER — Encounter: Payer: Self-pay | Admitting: Physical Medicine & Rehabilitation

## 2018-01-02 ENCOUNTER — Encounter: Payer: Medicare Other | Attending: Physical Medicine & Rehabilitation

## 2018-01-02 ENCOUNTER — Ambulatory Visit: Payer: Medicare Other | Admitting: Physical Medicine & Rehabilitation

## 2018-01-02 ENCOUNTER — Other Ambulatory Visit: Payer: Self-pay

## 2018-01-02 VITALS — BP 150/80 | HR 67

## 2018-01-02 DIAGNOSIS — Z7902 Long term (current) use of antithrombotics/antiplatelets: Secondary | ICD-10-CM | POA: Diagnosis not present

## 2018-01-02 DIAGNOSIS — I10 Essential (primary) hypertension: Secondary | ICD-10-CM

## 2018-01-02 DIAGNOSIS — K219 Gastro-esophageal reflux disease without esophagitis: Secondary | ICD-10-CM | POA: Diagnosis not present

## 2018-01-02 DIAGNOSIS — M810 Age-related osteoporosis without current pathological fracture: Secondary | ICD-10-CM | POA: Diagnosis not present

## 2018-01-02 DIAGNOSIS — I69354 Hemiplegia and hemiparesis following cerebral infarction affecting left non-dominant side: Secondary | ICD-10-CM | POA: Diagnosis not present

## 2018-01-02 DIAGNOSIS — R269 Unspecified abnormalities of gait and mobility: Secondary | ICD-10-CM | POA: Diagnosis not present

## 2018-01-02 DIAGNOSIS — I2510 Atherosclerotic heart disease of native coronary artery without angina pectoris: Secondary | ICD-10-CM | POA: Diagnosis not present

## 2018-01-02 DIAGNOSIS — I69398 Other sequelae of cerebral infarction: Secondary | ICD-10-CM | POA: Diagnosis not present

## 2018-01-02 DIAGNOSIS — E785 Hyperlipidemia, unspecified: Secondary | ICD-10-CM | POA: Diagnosis not present

## 2018-01-02 NOTE — Progress Notes (Signed)
Subjective:    Patient ID: Molly [name], female    DOB: [birth year], 84 y.o.   MRN: [id number] 84 y.o. female with history of CAD, h/o PE/DVT, RLE weakness, vertigo,  PAF--no anticoagulation due epidural hematoma,  HTN who was admitted on 12/07/17 with left sided weakness, rightward gaze, difficulty speaking and inability to walk. CTA/P done revealing abnormal perfusion right frontal operculum with severe stenosis of right MCA mid M2 brach, no emergent large vessel infarct. She was treated with tPA and follow MRI brain 12/27 reviewed, negative for acute process or bleed. Dr Leonie Man felt that episode due to embolic R-MCA stroke not seen on MRI.    She had issues with hypotension past admission requiring fluid boluses.   TEE with loop recorder to be scheduled on outpatient basis. ASA was changed to Plavix due to intracranial stenosis Admit date: 12/12/2017 Discharge date: 12/21/2017 Transitional care visit HPI PT, OT, SLP 2x per week each Amb with assist now , was Mod I with Rollator before CVA, dressing with assist even prior to CVA Appetite better at home, no swallowing issues at home, occ cough with eating Bowels ok Bladder working overtime More GERD sx on Protonix was changed to this med because of Prilosec/Plavix interaction Cardiology visit coming up this week Pain Inventory Average Pain 0 Pain Right Now 0 My pain is no pain  In the last 24 hours, has pain interfered with the following? General activity 0 Relation with others 0 Enjoyment of life 0 What TIME of day is your pain at its worst? no pain Sleep (in general) NA  Pain is worse with: no pain Pain improves with: no pain Relief from Meds: no pain  Mobility use a walker  Function retired I need assistance with the following:  dressing, bathing, toileting, meal prep, household duties and shopping  Neuro/Psych bladder control problems bowel control problems weakness tingling trouble walking dizziness  Prior  Studies Any changes since last visit?  no  Physicians involved in your care Any changes since last visit?  no   Family History  Problem Relation Age of Onset  . Anemia Mother   . Heart attack Brother    Social History   Socioeconomic History  . Marital status: Married    Spouse name: None  . Number of children: None  . Years of education: None  . Highest education level: None  Social Needs  . Financial resource strain: None  . Food insecurity - worry: None  . Food insecurity - inability: None  . Transportation needs - medical: None  . Transportation needs - non-medical: None  Occupational History  . None  Tobacco Use  . Smoking status: Never Smoker  . Smokeless tobacco: Never Used  Substance and Sexual Activity  . Alcohol use: No    Alcohol/week: 0.0 oz  . Drug use: No  . Sexual activity: None  Other Topics Concern  . None  Social History Narrative  . None   Past Surgical History:  Procedure Laterality Date  . ABDOMINAL HYSTERECTOMY    . BACK SURGERY     Rod and Pins Placed   . CARDIAC CATHETERIZATION N/A 11/17/2015   Procedure: Left Heart Cath and Coronary Angiography;  Surgeon: Jettie Booze, MD;  Location: [facility];  Service: Cardiovascular;  Laterality: N/A;  . CAROTID STENT     Past Medical History:  Diagnosis Date  . Acid reflux   . Anemia   . Arthritis   . CAD (coronary artery disease)   .  High cholesterol   . Hyperlipidemia   . Hypertension   . Osteoporosis   . Spinal stenosis    BP (!) 150/80   Pulse 67   SpO2 96%   Opioid Risk Score:   Fall Risk Score:  `1  Depression screen PHQ 2/9  Depression screen PHQ 2/9 01/02/2018  Decreased Interest 1  Down, Depressed, Hopeless 0  PHQ - 2 Score 1  Altered sleeping 0  Tired, decreased energy 1  Change in appetite 0  Feeling bad or failure about yourself  0  Trouble concentrating 0  Moving slowly or fidgety/restless 0  Suicidal thoughts 0  PHQ-9 Score 2  Difficult doing  work/chores Somewhat difficult    Review of Systems  HENT: Negative.   Eyes: Negative.   Respiratory: Negative.   Cardiovascular: Negative.   Gastrointestinal:       Bowel control  Endocrine: Negative.   Genitourinary:       Bladder control  Musculoskeletal: Positive for gait problem.  Skin: Negative.   Allergic/Immunologic: Negative.   Neurological: Positive for dizziness and weakness.       Tingling  Hematological: Bruises/bleeds easily.  Psychiatric/Behavioral: Negative.   All other systems reviewed and are negative.      Objective:   Physical Exam  Constitutional: She is oriented to person, place, and time. She appears well-developed and well-nourished. No distress.  HENT:  Head: Normocephalic and atraumatic.  Eyes: Conjunctivae and EOM are normal. Pupils are equal, round, and reactive to light.  Neck: Normal range of motion.  Cardiovascular: Normal rate and regular rhythm. Exam reveals no friction rub.  No murmur heard. Pulmonary/Chest: Effort normal and breath sounds normal. No respiratory distress. She has no wheezes.  Abdominal: Soft. Bowel sounds are normal. She exhibits no distension. There is tenderness.  Mild diffuse tenderness  Neurological: She is alert and oriented to person, place, and time. She displays no tremor. She exhibits normal muscle tone. Gait abnormal.  Motor strength is 4/5 in the left deltoid, bicep, tricep, grip, hip flexion, knee extensor, ankle dorsiflexor 5/5 in the right deltoid, bicep, tricep, grip, hip flexor, knee extensor, ankle dorsiflexor  Positive bradykinesia Transfers sit to stand with supervision using Rollator and arm chair  Skin: She is not diaphoretic.  Psychiatric: Thought content normal. Her affect is blunt. Her speech is delayed. She is slowed.  Nursing note and vitals reviewed.         Assessment & Plan:  1.  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   She is still not at her functional baseline particularly with mobility.  Her swallowing is improving but not quite back to baseline Recommend continue home health PT OT speech Physical medicine rehab follow-up 1 month consider transition to outpatient at that time Continue Plavix for stroke prophylaxis and follow-up with neurology 2.  History of atrial fibrillation, her management has been complicated by orthostatic hypotension.  Toprol-XL was reduced during hospitalization and blood pressures have been running in the normal range without any orthostatic drops.  Also she is drinking more fluids.  Follow-up with cardiology this week

## 2018-01-04 ENCOUNTER — Other Ambulatory Visit: Payer: Self-pay | Admitting: Interventional Cardiology

## 2018-01-04 ENCOUNTER — Ambulatory Visit: Payer: Medicare Other | Admitting: Nurse Practitioner

## 2018-01-04 ENCOUNTER — Encounter: Payer: Self-pay | Admitting: Nurse Practitioner

## 2018-01-04 VITALS — BP 120/70 | HR 65 | Ht 62.0 in | Wt 159.8 lb

## 2018-01-04 DIAGNOSIS — I639 Cerebral infarction, unspecified: Secondary | ICD-10-CM

## 2018-01-04 NOTE — Patient Instructions (Addendum)
Medication Instructions:   Your physician recommends that you continue on your current medications as directed. Please refer to the Current Medication list given to you today.   If you need a refill on your cardiac medications before your next appointment, please call your pharmacy.  Labwork: NONE ORDERED  TODAY    Testing/Procedures:  NONE ORDERED  TODAY   Follow-Up:  CONTACT CLINIC BACK ONCE YOU ARE READY TO SCHEDULED AND TEE AND LOOP RECORDER   Any Other Special Instructions Will Be Listed Below (If Applicable).

## 2018-01-05 ENCOUNTER — Encounter: Payer: Self-pay | Admitting: Nurse Practitioner

## 2018-01-06 ENCOUNTER — Encounter: Payer: Self-pay | Admitting: Nurse Practitioner

## 2018-01-09 ENCOUNTER — Other Ambulatory Visit: Payer: Self-pay

## 2018-01-09 ENCOUNTER — Telehealth: Payer: Self-pay | Admitting: Neurology

## 2018-01-09 MED ORDER — CLOPIDOGREL BISULFATE 75 MG PO TABS: 75.0000 mg | ORAL_TABLET | Freq: Every day | ORAL | 0 refills | Status: DC

## 2018-01-09 NOTE — Telephone Encounter (Signed)
Pts daughter is requesting a refill for clopidogrel (PLAVIX) 75 MG tablet and pantoprazole (PROTONIX) 40 MG tablet. Pt hasnt been seen in our office but was seen in the hospital where she was told we would handled medication refills. [facility] also stated that they had faxed over refill requested twice for the two medications.

## 2018-01-09 NOTE — Telephone Encounter (Signed)
Rn spoke with patients daughter Molly [name] [phone]. Rn stated per discharge the pt can get a refill on plavix till her visit with Dr. Leonie Man in March 2019. Rn stated the protonix will have to be refill, and assess by her moms primary doctor. Rn stated Dr. Leonie Man handles all of the stroke medications. [name] verbalized understanding,and will wait on refill for plavix. Refill sent for 3 months until pt appt in March 2019.

## 2018-01-12 ENCOUNTER — Telehealth: Payer: Self-pay | Admitting: Nurse Practitioner

## 2018-01-12 NOTE — Telephone Encounter (Signed)
New Message   Patients daughter Molly [name] is calling about scheduling test that Chanetta Marshall is requesting. She advised that she was told to speak with triage.  Please call to discuss.

## 2018-01-12 NOTE — Telephone Encounter (Signed)
Spoke with patient's daughter who said that she and her husband have the flu and that her mother is taking Tamiflu and not feeling well right now.   They would like to call us when they are feeling better to schedule procedure in accordance to Dr. Jackalyn Lombard schedule.

## 2018-01-16 NOTE — Telephone Encounter (Signed)
Can you follow up with her when you get a chance?  Needs TEE and ILR on a day when [facility] in the hospital.  Thank you!

## 2018-01-20 NOTE — Telephone Encounter (Addendum)
Spoke to [place], dtr.  She and mom are both getting over the flu.  She is going to call me next week to schedule. She is aware we will schedule procedures for the end of this month/beginning of next.

## 2018-01-30 ENCOUNTER — Encounter: Payer: Self-pay | Admitting: Physical Medicine & Rehabilitation

## 2018-01-30 ENCOUNTER — Encounter: Payer: Self-pay | Admitting: Nurse Practitioner

## 2018-01-30 ENCOUNTER — Encounter: Payer: Medicare Other | Attending: Physical Medicine & Rehabilitation

## 2018-01-30 ENCOUNTER — Ambulatory Visit: Payer: Medicare Other | Admitting: Physical Medicine & Rehabilitation

## 2018-01-30 VITALS — BP 90/60 | HR 73

## 2018-01-30 DIAGNOSIS — I69398 Other sequelae of cerebral infarction: Secondary | ICD-10-CM

## 2018-01-30 DIAGNOSIS — M810 Age-related osteoporosis without current pathological fracture: Secondary | ICD-10-CM | POA: Insufficient documentation

## 2018-01-30 DIAGNOSIS — I69354 Hemiplegia and hemiparesis following cerebral infarction affecting left non-dominant side: Secondary | ICD-10-CM | POA: Insufficient documentation

## 2018-01-30 DIAGNOSIS — I2510 Atherosclerotic heart disease of native coronary artery without angina pectoris: Secondary | ICD-10-CM | POA: Insufficient documentation

## 2018-01-30 DIAGNOSIS — R269 Unspecified abnormalities of gait and mobility: Secondary | ICD-10-CM | POA: Insufficient documentation

## 2018-01-30 DIAGNOSIS — K219 Gastro-esophageal reflux disease without esophagitis: Secondary | ICD-10-CM | POA: Insufficient documentation

## 2018-01-30 DIAGNOSIS — I10 Essential (primary) hypertension: Secondary | ICD-10-CM | POA: Diagnosis not present

## 2018-01-30 DIAGNOSIS — Z7902 Long term (current) use of antithrombotics/antiplatelets: Secondary | ICD-10-CM | POA: Diagnosis not present

## 2018-01-30 DIAGNOSIS — E785 Hyperlipidemia, unspecified: Secondary | ICD-10-CM | POA: Insufficient documentation

## 2018-01-30 NOTE — Progress Notes (Signed)
Subjective:    Patient ID: Molly [name], female    DOB: 09-09-1929, 84 y.o.   MRN: [id number] 84 y.o. female with history of CAD, h/o PE/DVT, RLE weakness, vertigo,  PAF--no anticoagulation due epidural hematoma,  HTN who was admitted on 12/07/17 with left sided weakness, rightward gaze, difficulty speaking and inability to walk. CTA/P done revealing abnormal perfusion right frontal operculum with severe stenosis of right MCA mid M2 brach, no emergent large vessel infarct. She was treated with tPA and follow MRI brain 12/27 reviewed, negative for acute process or bleed. Dr Leonie Man felt that episode due to embolic R-MCA stroke not seen on MRI.    She had issues with hypotension past admission requiring fluid boluses.   TEE with loop recorder to be scheduled on outpatient basis. ASA was changed to Plavix due to intracranial stenosis Admit date: 12/12/2017 Discharge date: 12/21/2017  HPI OT and Speech stopped PT is continuing  Leaning to Right has  No swallowing issues other than using applesauce for meds  Occ dizziness occurs with head position changes  Reviewed blood pressures, does have some morning readings with systolics in the 26R  Pain Inventory Average Pain 5 Pain Right Now 0 My pain is burning, stabbing and tingling  In the last 24 hours, has pain interfered with the following? General activity 6 Relation with others 4 Enjoyment of life 5 What TIME of day is your pain at its worst? daytime Sleep (in general) Good  Pain is worse with: bending Pain improves with: rest and medication Relief from Meds: 5  Mobility use a walker ability to climb steps?  no do you drive?  no needs help with transfers  Function retired  Neuro/Psych bladder control problems bowel control problems weakness numbness tingling trouble walking dizziness anxiety  Prior Studies Any changes since last visit?  no  Physicians involved in your care Any changes since last visit?   no   Family History  Problem Relation Age of Onset  . Anemia Mother   . Heart attack Brother    Social History   Socioeconomic History  . Marital status: Married    Spouse name: Not on file  . Number of children: Not on file  . Years of education: Not on file  . Highest education level: Not on file  Social Needs  . Financial resource strain: Not on file  . Food insecurity - worry: Not on file  . Food insecurity - inability: Not on file  . Transportation needs - medical: Not on file  . Transportation needs - non-medical: Not on file  Occupational History  . Not on file  Tobacco Use  . Smoking status: Never Smoker  . Smokeless tobacco: Never Used  Substance and Sexual Activity  . Alcohol use: No    Alcohol/week: 0.0 oz  . Drug use: No  . Sexual activity: Not on file  Other Topics Concern  . Not on file  Social History Narrative  . Not on file   Past Surgical History:  Procedure Laterality Date  . ABDOMINAL HYSTERECTOMY    . BACK SURGERY     Rod and Pins Placed   . CARDIAC CATHETERIZATION N/A 11/17/2015   Procedure: Left Heart Cath and Coronary Angiography;  Surgeon: Jettie Booze, MD;  Location: [facility];  Service: Cardiovascular;  Laterality: N/A;  . CAROTID STENT     Past Medical History:  Diagnosis Date  . Acid reflux   . Anemia   . Arthritis   .  CAD (coronary artery disease)   . High cholesterol   . Hyperlipidemia   . Hypertension   . Osteoporosis   . Spinal stenosis    There were no vitals taken for this visit.  Opioid Risk Score:   Fall Risk Score:  `1  Depression screen PHQ 2/9  Depression screen PHQ 2/9 01/02/2018  Decreased Interest 1  Down, Depressed, Hopeless 0  PHQ - 2 Score 1  Altered sleeping 0  Tired, decreased energy 1  Change in appetite 0  Feeling bad or failure about yourself  0  Trouble concentrating 0  Moving slowly or fidgety/restless 0  Suicidal thoughts 0  PHQ-9 Score 2  Difficult doing work/chores  Somewhat difficult     Review of Systems  Constitutional: Negative.   HENT: Negative.   Eyes: Negative.   Respiratory: Negative.   Cardiovascular: Negative.   Gastrointestinal: Positive for constipation and diarrhea.  Endocrine: Negative.   Genitourinary: Negative.   Musculoskeletal: Positive for gait problem.  Skin: Negative.   Allergic/Immunologic: Negative.   Hematological: Negative.   Psychiatric/Behavioral: Negative.   All other systems reviewed and are negative.      Objective:   Physical Exam  Elderly female no acute distress Mood and affect are appropriate Motor strength is 4/5 in the left deltoid bicep tricep grip 4+ in the right deltoid bicep tricep grip 4+ in the right hip flexor knee extensor ankle dorsiflexor 4- in the left hip flexor knee extensor ankle dorsiflexor Valgus deformity right knee Sit to stand with supervision holding onto a walker.  Ambulates with supervision right knee valgus deformity accentuated during ambulation. No evidence of toe drag on the left side.       Assessment & Plan:  1.  Right MCA distribution infarct with left hemiparesis which has been improving.  She has finished up with home health OT and speech.  I do not think she needs any further home health speech therapy But may benefit from OT working on ADLs, more complex activities. Also recommend outpatient physical therapy. Have written orders  Follow-up with neurology planned for secondary stroke prevention.  Physical medicine rehab follow-up in 6 weeks  Address blood pressure issues with primary care patient remains on Toprol-XL low dose  Patient ambulated with a systolic of 90 in clinic today no dizziness symptoms.  I am not convinced that she has typical orthostatic hypotension signs.  She may have more of a positional vertigo

## 2018-01-30 NOTE — Patient Instructions (Signed)
Please make appt with Dr Leonie Man from Neurology

## 2018-01-31 ENCOUNTER — Encounter: Payer: Self-pay | Admitting: *Deleted

## 2018-01-31 ENCOUNTER — Telehealth: Payer: Self-pay | Admitting: Nurse Practitioner

## 2018-01-31 DIAGNOSIS — Z01812 Encounter for preprocedural laboratory examination: Secondary | ICD-10-CM

## 2018-01-31 NOTE — Telephone Encounter (Signed)
New message  Pt daughter verbalized that she is calling for the RN  She wants to schedule a TEE and an loop recorder Dr. Rayann Heman

## 2018-01-31 NOTE — Telephone Encounter (Signed)
Called patient's daughter to confirm there are not any conflicts on potential procedure days.  Scheduled patient for TEE with Dr. Aundra Dubin on 02/09/18 at 11:00 am.  Pt to arrive by 9:30 am.  Case # [id number]. Added to EP lab schedule for same day with Dr. Rayann Heman for linq insertion.  Will come for labs and pick up instruction letter on Wed Feb 01, 2018.  Lab appointment made. Reviewed medications.  Advised pt needs to take Plavix.  Suggested she hold most other meds.  Take Toprol, Imdur and protonix and Plavix.

## 2018-02-01 ENCOUNTER — Other Ambulatory Visit: Payer: Medicare Other | Admitting: *Deleted

## 2018-02-01 DIAGNOSIS — Z01812 Encounter for preprocedural laboratory examination: Secondary | ICD-10-CM

## 2018-02-02 ENCOUNTER — Encounter: Payer: Self-pay | Admitting: Nurse Practitioner

## 2018-02-02 LAB — CBC WITH DIFFERENTIAL
Basophils Absolute: 0 10*3/uL (ref 0.0–0.2)
Basos: 1 %
EOS (ABSOLUTE): 0.1 10*3/uL (ref 0.0–0.4)
EOS: 1 %
Hematocrit: 36.2 % (ref 34.0–46.6)
Hemoglobin: 12.5 g/dL (ref 11.1–15.9)
IMMATURE GRANS (ABS): 0 10*3/uL (ref 0.0–0.1)
Immature Granulocytes: 0 %
LYMPHS: 16 %
Lymphocytes Absolute: 1.4 10*3/uL (ref 0.7–3.1)
MCH: 31.6 pg (ref 26.6–33.0)
MCHC: 34.5 g/dL (ref 31.5–35.7)
MCV: 91 fL (ref 79–97)
Monocytes Absolute: 1.1 10*3/uL — ABNORMAL HIGH (ref 0.1–0.9)
Monocytes: 13 %
NEUTROS ABS: 6.2 10*3/uL (ref 1.4–7.0)
NEUTROS PCT: 69 %
RBC: 3.96 x10E6/uL (ref 3.77–5.28)
RDW: 13.6 % (ref 12.3–15.4)
WBC: 8.8 10*3/uL (ref 3.4–10.8)

## 2018-02-02 LAB — BASIC METABOLIC PANEL
BUN / CREAT RATIO: 17 (ref 12–28)
BUN: 17 mg/dL (ref 8–27)
CO2: 22 mmol/L (ref 20–29)
CREATININE: 0.99 mg/dL (ref 0.57–1.00)
Calcium: 9.8 mg/dL (ref 8.7–10.3)
Chloride: 105 mmol/L (ref 96–106)
GFR calc Af Amer: 60 mL/min/{1.73_m2} (ref >59)
GFR, EST NON AFRICAN AMERICAN: 52 mL/min/{1.73_m2} — AB (ref >59)
Glucose: 93 mg/dL (ref 65–99)
Potassium: 3.9 mmol/L (ref 3.5–5.2)
SODIUM: 143 mmol/L (ref 134–144)

## 2018-02-02 LAB — PROTIME-INR
INR: 1.1 (ref 0.8–1.2)
Prothrombin Time: 11 s (ref 9.1–12.0)

## 2018-02-02 NOTE — Telephone Encounter (Signed)
Informed daughter

## 2018-02-03 NOTE — Telephone Encounter (Signed)
Called and spoke to patient's daughter [name] (DPR on file). She states that they have switched her metoprolol to 12.5 mg to take in the morning and taking her imdur at night. She states that they only gave her 15 mg of the imdur las tonight d/t hypotension yesterday and her BP has been better today with systolics in the 301T. Instructed her to continue giving her the metoprolol 12.5 mg in the AM and imdur 15 mg in the PM for now since that seems to have helped. Made her aware that per Dr. Irish Lack, goal is to keep patient's BP >100/60. She verbalized understanding.   Daughter also states that the patient has developed some diarrhea. She is going to reach out to PCP. She is wondering if this will affect her procedure scheduled for 2/28. Made her aware that we will check in early next week to see if her diarrhea has improved. She verbalized understanding and thanked me for the call.

## 2018-02-06 ENCOUNTER — Telehealth: Payer: Self-pay | Admitting: Internal Medicine

## 2018-02-06 NOTE — Telephone Encounter (Signed)
I spoke with pt's daughter who reports pt has been sick since Friday. Improving but still has low grade fever. She does not want to reschedule procedure at this time but will call back when pt is feeling better to schedule.  Will make Chanetta Marshall, NP aware. Cath lab and endo notified and procedures cancelled.

## 2018-02-06 NOTE — Telephone Encounter (Signed)
Patient daughter calling,  Patient has been sick with fever, nausea and diarrhea, so she would like to r/s cath procedure on 02-09-18.     Pt c/o BP issue: STAT if pt c/o blurred vision, one-sided weakness or slurred speech  1. What are your last 5 BP readings? n/a  2. Are you having any other symptoms (ex. Dizziness, headache, blurred vision, passed out)? Fever, nausea and diarrhea   3. What is your BP issue? BP fluctuating and having fatigue.

## 2018-02-09 ENCOUNTER — Ambulatory Visit (HOSPITAL_COMMUNITY): Admit: 2018-02-09 | Payer: Medicare Other | Admitting: Cardiology

## 2018-02-09 ENCOUNTER — Encounter (HOSPITAL_COMMUNITY): Payer: Self-pay

## 2018-02-09 SURGERY — ECHOCARDIOGRAM, TRANSESOPHAGEAL
Anesthesia: Moderate Sedation

## 2018-02-09 SURGERY — LOOP RECORDER INSERTION

## 2018-02-15 NOTE — Telephone Encounter (Signed)
Per last note, daughter will call office to reschedule.  Closing encounter

## 2018-02-16 ENCOUNTER — Ambulatory Visit: Payer: Medicare Other

## 2018-02-22 ENCOUNTER — Ambulatory Visit: Payer: Medicare Other | Admitting: Neurology

## 2018-02-22 ENCOUNTER — Encounter: Payer: Self-pay | Admitting: Neurology

## 2018-02-22 VITALS — BP 140/76 | HR 68 | Wt 156.0 lb

## 2018-02-22 DIAGNOSIS — I639 Cerebral infarction, unspecified: Secondary | ICD-10-CM

## 2018-02-22 NOTE — Progress Notes (Signed)
[facility] [address] [place]. [place] [address] [phone]       OFFICE FOLLOW-UP NOTE  Molly. Molly [name] Date of Birth:  [birth year] Medical Record Number:  [id number]   HPI: Molly [name] is 84 year old Caucasian lady seen today for first office follow-up visit following hospital admission for stroke in December 2018. She is accompanied by her daughter. History is obtained from them as well as review of electronic medical records. I have personally reviewed imaging films. Molly [name] a 84 y.o.femalewith PMH of coronary artery disease, hyperlipidemia, hypertension, atrial fibrillation on no anticoagulationpresenting to the emergency room with a last known normal of 9:15 AM on 12/07/2017 withleft-sided weakness and inability to stand.NIHSS 10.CT-scan of the brain -no acute change. Aspects 10. CT Angio of head and neck no large vessel occlusion but distal right M3 stenosis versus clot..CT perfusion no cortical infarct but tissue at risk 9 mL CT perfusion study of the brain shows a small area of penumbra in the right MCA territory with no core. IV TPA given Wed 12/07/17. Episode is likely aborted stroke with right M2 stenosis. MRI scan did not show definite right hemispheric stroke but clinically she had left hemiparalysis complete the stent with a small right brain stroke not seen on MRI likely because patient got tPA. Vascular risk factors identified included atrial fibrillation, hypertension and hyperlipidemia as well as coronary artery disease and advanced age. Plavix was added to the patient's aspirin and statin dose was increased. She was transferred to inpatient rehabilitation from where she made gradual progress and is currently living at home. She is opting improvement in left-sided strength but still needs 1 person assist to get up and walk with a walker. She walks very limited. She fatigues very easily and gets tired. She needs a person to walk  close to her. She needs help with activities like dressing herself and feeding herself and cannot be left alone. The patient is getting home physical and occupational therapy and has finished patient therapy the daughters not satisfied and feels patient still has trouble with eating and pocketing on the left side and fatigues easily when sitting.there are plans to consider outpatient TEE and loop recorder for this patient but she has a history of epidural hematoma in the spine while she was on warfarin for deep and thrombosis several years ago. She seems quite frail and his fall risk and hence I do not believe she is a good long-term candidate for anticoagulation since doing further workup with a TEE and loop recorder is not a good idea in my opinion. I have discussed this with the patient's daughter Molly [name] who is in agreement with my plan     ROS:   14 system review of systems is positive for  Weakness, gait difficulty, bruising, pedal edema, fatigue, difficulty eatingdecreased hearing and all other systems negative  PMH:  Past Medical History:  Diagnosis Date  . Acid reflux   . Anemia   . Arthritis   . CAD (coronary artery disease)   . High cholesterol   . Hyperlipidemia   . Hypertension   . Osteoporosis   . Spinal stenosis     Social History:  Social History   Socioeconomic History  . Marital status: Married    Spouse name: Not on file  . Number of children: Not on file  . Years of education: Not on file  . Highest education level: Not on file  Social Needs  . Financial resource strain: Not on file  .  Food insecurity - worry: Not on file  . Food insecurity - inability: Not on file  . Transportation needs - medical: Not on file  . Transportation needs - non-medical: Not on file  Occupational History  . Not on file  Tobacco Use  . Smoking status: Never Smoker  . Smokeless tobacco: Never Used  Substance and Sexual Activity  . Alcohol use: No    Alcohol/week: 0.0 oz  . Drug  use: No  . Sexual activity: Not on file  Other Topics Concern  . Not on file  Social History Narrative  . Not on file    Medications:   Current Outpatient Medications on File Prior to Visit  Medication Sig Dispense Refill  . acetaminophen (TYLENOL) 500 MG tablet Take 500 mg by mouth every 8 (eight) hours as needed for mild pain.     . bisacodyl (DULCOLAX) 10 MG suppository Place 10 mg rectally as needed for moderate constipation.    . calcium-vitamin D (OSCAL WITH D) 500-200 MG-UNIT tablet Take 1 tablet by mouth 2 (two) times daily. 120 tablet 0  . clopidogrel (PLAVIX) 75 MG tablet Take 1 tablet (75 mg total) by mouth daily. 90 tablet 0  . denosumab (PROLIA) 60 MG/ML SOLN injection Inject 60 mg into the skin every 6 (six) months. Administer in upper arm, thigh, or abdomen    . fexofenadine (ALLEGRA) 180 MG tablet Take 180 mg by mouth daily as needed for allergies or rhinitis.    Marland Kitchen gabapentin (NEURONTIN) 100 MG capsule Take 100 mg by mouth daily with supper.     . Glucosamine-Chondroitin (OSTEO BI-FLEX REGULAR STRENGTH PO) Take 1 capsule by mouth 2 (two) times daily.    . isosorbide mononitrate (IMDUR) 30 MG 24 hr tablet Take 1 tablet (30 mg total) by mouth daily. (Patient taking differently: Take 15 mg by mouth daily. ) 180 tablet 3  . metoprolol succinate (TOPROL-XL) 25 MG 24 hr tablet Take 0.5 tablets (12.5 mg total) by mouth daily. (Patient taking differently: Take 12.5 mg by mouth daily with supper. ) 90 tablet 3  . Misc Natural Products (NARCOSOFT HERBAL LAX PO) Take 2 capsules by mouth 2 (two) times daily as needed (constipation).     . nitroGLYCERIN (NITROSTAT) 0.4 MG SL tablet Place 0.4 mg under the tongue every 5 (five) minutes as needed for chest pain (x 3 pills).     . Omega-3 Fatty Acids (FISH OIL) 1000 MG CAPS Take 1,000 mg by mouth 2 (two) times daily.    . pantoprazole (PROTONIX) 40 MG tablet Take 1 tablet (40 mg total) by mouth daily. 30 tablet 0  . Polysaccharide Iron  Complex (NU-IRON PO) Take by mouth.    . Probiotic Product (ALIGN) 4 MG CAPS Take 4 mg by mouth daily.     . rosuvastatin (CRESTOR) 20 MG tablet Take 20 mg by mouth at bedtime.    . sennosides-docusate sodium (SENOKOT-S) 8.6-50 MG tablet Take 2 tablets by mouth 2 (two) times daily.     . TURMERIC PO Take 1 tablet by mouth daily.    . vitamin C (ASCORBIC ACID) 250 MG tablet Take 250 mg by mouth daily.     No current facility-administered medications on file prior to visit.     Allergies:   Allergies  Allergen Reactions  . Buprenorphine Hcl Shortness Of Breath    Chest pain  . Codeine Nausea And Vomiting    ulcers  . Morphine And Related Shortness Of Breath  Chest pain, made me crazy  . Sulfacetamide Sodium Nausea And Vomiting  . Vicodin [Hydrocodone-Acetaminophen] Nausea And Vomiting  . Evista [Raloxifene] Other (See Comments) and Nausea And Vomiting    Upset stomach  . Hydromorphone Nausea And Vomiting    Sensitive  . Other Other (See Comments)    Narcotic-like medications - does NOT tolerate pain meds, causes unrelating volatile vomiting and drops in BP below 80/40 with dizziness and fainting  Tourniquet - Please place tourniquet over cloth - causes sever bruising.  Blood pressure cuff - please use manual blood pressure cuff - causes excoriating pain using machine and bruising     . Sulfur Nausea And Vomiting    Vomited until bleeding occurred and developed an active stomach ulcer  . Tape Other (See Comments)    Rips skin    Physical Exam General: cachectic frail elderly Caucasian lady, seated, in no evident distress Head: head normocephalic and atraumatic.  Neck: supple with no carotid or supraclavicular bruits Cardiovascular: regular rate and rhythm, no murmurs Musculoskeletal: no deformity except marked kyphosis and slight scoliosis to the right Skin:  no rash/petichiae Vascular:  Normal pulses all extremities ut 2+ pedal edema bilaterally Vitals:   02/22/18 1116    BP: 140/76  Pulse: 68   Neurologic Exam Mental Status: Awake and fully alert. Oriented to place and time. Recent and remote memory diminished. Attention span, concentration and fund of knowledge diminished. Mood and affect appropriate.  Cranial Nerves: Fundoscopic exam reveals sharp disc margins. Pupils equal, briskly reactive to light. Extraocular movements full without nystagmus. Visual fields full to confrontation. Hearing diminished bilaterally. Facial sensation intact.mild left lower facial asymmetry., tongue, palate moves normally and symmetrically.  Motor: left hemiplegia 4/5 with weakness left grip and intrinsic muscles.normal strength on the right side. Sensory.: intact to touch ,pinprick .position and vibratory sensation.  Coordination: Rapid alternating movements normal in all extremities. Finger-to-nose and heel-to-shin performed accurately bilaterally.but slower on the left Gait and Station: Arises from chair with t difficulty. Stance is stoopedl. Requires a wheeled walker and 1 person assist to walk with weightbearing on the left side Reflexes: 1+ and symmetric. Toes downgoing.   NIHSS  4 Modified Rankin  3  ASSESSMENT: 84 year old patient with right MCA branch infarct in December 2018 of cryptogenic etiology with residual mild left hemiparesis.    PLAN: I had a long d/w patient and her daughter about her recent  Cryptogenic stroke, risk for recurrent stroke/TIAs, personally independently reviewed imaging studies and stroke evaluation results and answered questions.Continue Plavix for secondary stroke prevention and maintain strict control of hypertension with blood pressure goal below 130/90, diabetes with hemoglobin A1c goal below 6.5% and lipids with LDL cholesterol goal below 70 mg/dL. I also advised the patient to eat a healthy diet with plenty of whole grains, cereals, fruits and vegetables, exercise regularly and maintain ideal body weight . We will not plan to do further  evaluation did TEE or loop recorder given patient's frail status, high fall risk and previous history of epidural hematoma on warfarin she is at high risk for bleeding. She was advised to use a walker at all times and fall and safety prevention precautions were discussed.I recommend starting outpatient physical occupational and speech therapy and will order it.Followup in the future with my nurse practitioner Janett Billow in 3 months or call earlier if necessary Greater than 50% of time during this 25 minute visit was spent on counseling,explanation of diagnosis of cryptogenic stroke, planning of further management, discussion with  patient and family and coordination of care Antony Contras, MD  [facility] [address] [place], [place] [address]  Phone [phone] Fax [phone] Note: This document was prepared with digital dictation and possible smart phrase technology. Any transcriptional errors that result from this process are unintentional

## 2018-02-22 NOTE — Patient Instructions (Signed)
I had a long d/w patient and her daughter about her recent  Cryptogenic stroke, risk for recurrent stroke/TIAs, personally independently reviewed imaging studies and stroke evaluation results and answered questions.Continue Plavix for secondary stroke prevention and maintain strict control of hypertension with blood pressure goal below 130/90, diabetes with hemoglobin A1c goal below 6.5% and lipids with LDL cholesterol goal below 70 mg/dL. I also advised the patient to eat a healthy diet with plenty of whole grains, cereals, fruits and vegetables, exercise regularly and maintain ideal body weight . We will not plan to do further evaluation did TEE or loop recorder given patient's frail status, high fall risk and previous history of epidural hematoma on warfarin she is at high risk for bleeding. She was advised to use a walker at all times and fall and safety prevention precautions were discussed.I recommend starting outpatient physical occupational and speech therapy and will order it.Followup in the future with my nurse practitioner Janett Billow in 3 months or call earlier if necessary   Stroke Prevention Some medical conditions and behaviors are associated with a higher chance of having a stroke. You can help prevent a stroke by making nutrition, lifestyle, and other changes, including managing any medical conditions you may have. What nutrition changes can be made?  Eat healthy foods. You can do this by: ? Choosing foods high in fiber, such as fresh fruits and vegetables and whole grains. ? Eating at least 5 or more servings of fruits and vegetables a day. Try to fill half of your plate at each meal with fruits and vegetables. ? Choosing lean protein foods, such as lean cuts of meat, poultry without skin, fish, tofu, beans, and nuts. ? Eating low-fat dairy products. ? Avoiding foods that are high in salt (sodium). This can help lower blood pressure. ? Avoiding foods that have saturated fat, trans fat, and  cholesterol. This can help prevent high cholesterol. ? Avoiding processed and premade foods.  Follow your health care provider's specific guidelines for losing weight, controlling high blood pressure (hypertension), lowering high cholesterol, and managing diabetes. These may include: ? Reducing your daily calorie intake. ? Limiting your daily sodium intake to 1,500 milligrams (mg). ? Using only healthy fats for cooking, such as olive oil, canola oil, or sunflower oil. ? Counting your daily carbohydrate intake. What lifestyle changes can be made?  Maintain a healthy weight. Talk to your health care provider about your ideal weight.  Get at least 30 minutes of moderate physical activity at least 5 days a week. Moderate activity includes brisk walking, biking, and swimming.  Do not use any products that contain nicotine or tobacco, such as cigarettes and e-cigarettes. If you need help quitting, ask your health care provider. It may also be helpful to avoid exposure to secondhand smoke.  Limit alcohol intake to no more than 1 drink a day for nonpregnant women and 2 drinks a day for men. One drink equals 12 oz of beer, 5 oz of wine, or 1 oz of hard liquor.  Stop any illegal drug use.  Avoid taking birth control pills. Talk to your health care provider about the risks of taking birth control pills if: ? You are over 51 years old. ? You smoke. ? You get migraines. ? You have ever had a blood clot. What other changes can be made?  Manage your cholesterol levels. ? Eating a healthy diet is important for preventing high cholesterol. If cholesterol cannot be managed through diet alone, you may also need  to take medicines. ? Take any prescribed medicines to control your cholesterol as told by your health care provider.  Manage your diabetes. ? Eating a healthy diet and exercising regularly are important parts of managing your blood sugar. If your blood sugar cannot be managed through diet and  exercise, you may need to take medicines. ? Take any prescribed medicines to control your diabetes as told by your health care provider.  Control your hypertension. ? To reduce your risk of stroke, try to keep your blood pressure below 130/80. ? Eating a healthy diet and exercising regularly are an important part of controlling your blood pressure. If your blood pressure cannot be managed through diet and exercise, you may need to take medicines. ? Take any prescribed medicines to control hypertension as told by your health care provider. ? Ask your health care provider if you should monitor your blood pressure at home. ? Have your blood pressure checked every year, even if your blood pressure is normal. Blood pressure increases with age and some medical conditions.  Get evaluated for sleep disorders (sleep apnea). Talk to your health care provider about getting a sleep evaluation if you snore a lot or have excessive sleepiness.  Take over-the-counter and prescription medicines only as told by your health care provider. Aspirin or blood thinners (antiplatelets or anticoagulants) may be recommended to reduce your risk of forming blood clots that can lead to stroke.  Make sure that any other medical conditions you have, such as atrial fibrillation or atherosclerosis, are managed. What are the warning signs of a stroke? The warning signs of a stroke can be easily remembered as BEFAST.  B is for balance. Signs include: ? Dizziness. ? Loss of balance or coordination. ? Sudden trouble walking.  E is for eyes. Signs include: ? A sudden change in vision. ? Trouble seeing.  F is for face. Signs include: ? Sudden weakness or numbness of the face. ? The face or eyelid drooping to one side.  A is for arms. Signs include: ? Sudden weakness or numbness of the arm, usually on one side of the body.  S is for speech. Signs include: ? Trouble speaking (aphasia). ? Trouble understanding.  T is for  time. ? These symptoms may represent a serious problem that is an emergency. Do not wait to see if the symptoms will go away. Get medical help right away. Call your local emergency services (911 in the [place] Do not drive yourself to the hospital.  Other signs of stroke may include: ? A sudden, severe headache with no known cause. ? Nausea or vomiting. ? Seizure.  Where to find more information: For more information, visit:  American Stroke Association: www.strokeassociation.org  National Stroke Association: www.stroke.org  Summary  You can prevent a stroke by eating healthy, exercising, not smoking, limiting alcohol intake, and managing any medical conditions you may have.  Do not use any products that contain nicotine or tobacco, such as cigarettes and e-cigarettes. If you need help quitting, ask your health care provider. It may also be helpful to avoid exposure to secondhand smoke.  Remember BEFAST for warning signs of stroke. Get help right away if you or a loved one has any of these signs. This information is not intended to replace advice given to you by your health care provider. Make sure you discuss any questions you have with your health care provider. Document Released: 01/06/2005 Document Revised: 01/04/2017 Document Reviewed: 01/04/2017 Elsevier Interactive Patient Education  2018 Elsevier  Inc.

## 2018-03-07 ENCOUNTER — Ambulatory Visit: Payer: Medicare Other | Admitting: Physical Therapy

## 2018-03-07 ENCOUNTER — Ambulatory Visit: Payer: Medicare Other | Admitting: *Deleted

## 2018-03-08 ENCOUNTER — Encounter: Payer: Self-pay | Admitting: Podiatry

## 2018-03-08 ENCOUNTER — Encounter: Payer: Self-pay | Admitting: Neurology

## 2018-03-08 ENCOUNTER — Ambulatory Visit (INDEPENDENT_AMBULATORY_CARE_PROVIDER_SITE_OTHER): Payer: Medicare Other | Admitting: Podiatry

## 2018-03-08 DIAGNOSIS — M779 Enthesopathy, unspecified: Secondary | ICD-10-CM

## 2018-03-08 DIAGNOSIS — L84 Corns and callosities: Secondary | ICD-10-CM

## 2018-03-08 MED ORDER — TRIAMCINOLONE ACETONIDE 10 MG/ML IJ SUSP
10.0000 mg | Freq: Once | INTRAMUSCULAR | Status: AC
  Administered 2018-03-08: 10 mg

## 2018-03-09 NOTE — Progress Notes (Signed)
Subjective:   Patient ID: Molly [name], female   DOB: 84 y.o.   MRN: [id number]   HPI Patient presents with again discomfort in the left first MPJ and pain on the ball the left foot and the fourth digit right foot that are painful   ROS      Objective:  Physical Exam  Inflammatory capsulitis left first MPJ with hallux limitus deformity and hammertoe keratotic lesion formation fourth right and lesion left     Assessment:  H&P conditions reviewed and discussed with patient inflammatory hallux limitus and hammertoe deformity     Plan:  Today I did inject the left first MPJ 3 mg dexamethasone Kenalog 5 mg Xylocaine and I debrided lesions bilateral with no iatrogenic bleeding.  Reappoint routine care

## 2018-03-13 ENCOUNTER — Ambulatory Visit: Payer: Medicare Other | Admitting: Physical Medicine & Rehabilitation

## 2018-03-16 ENCOUNTER — Other Ambulatory Visit: Payer: Self-pay

## 2018-03-20 ENCOUNTER — Other Ambulatory Visit: Payer: Self-pay

## 2018-03-20 DIAGNOSIS — I639 Cerebral infarction, unspecified: Secondary | ICD-10-CM

## 2018-03-22 ENCOUNTER — Ambulatory Visit: Payer: Medicare Other | Admitting: Occupational Therapy

## 2018-03-23 ENCOUNTER — Ambulatory Visit: Payer: Medicare Other

## 2018-03-23 ENCOUNTER — Other Ambulatory Visit: Payer: Self-pay

## 2018-03-23 ENCOUNTER — Ambulatory Visit: Payer: Medicare Other | Admitting: Speech Pathology

## 2018-03-23 NOTE — Patient Outreach (Signed)
Telephone outreach to patient to obtain mRS was successfully completed. mRS = 5  I spoke with her Daughter Lendon Ka - DPR signed.

## 2018-03-24 ENCOUNTER — Other Ambulatory Visit: Payer: Self-pay | Admitting: Interventional Cardiology

## 2018-03-30 ENCOUNTER — Ambulatory Visit: Payer: Medicare Other

## 2018-04-18 ENCOUNTER — Other Ambulatory Visit: Payer: Self-pay | Admitting: Neurology

## 2018-04-26 ENCOUNTER — Ambulatory Visit: Payer: Medicare Other | Admitting: Podiatry

## 2018-05-25 ENCOUNTER — Ambulatory Visit: Payer: Medicare Other | Admitting: Adult Health

## 2018-05-25 ENCOUNTER — Encounter: Payer: Self-pay | Admitting: Adult Health

## 2018-05-25 VITALS — BP 141/69 | HR 62 | Ht 62.0 in | Wt 155.0 lb

## 2018-05-25 DIAGNOSIS — R413 Other amnesia: Secondary | ICD-10-CM

## 2018-05-25 DIAGNOSIS — I10 Essential (primary) hypertension: Secondary | ICD-10-CM

## 2018-05-25 DIAGNOSIS — I639 Cerebral infarction, unspecified: Secondary | ICD-10-CM

## 2018-05-25 DIAGNOSIS — E785 Hyperlipidemia, unspecified: Secondary | ICD-10-CM | POA: Diagnosis not present

## 2018-05-25 MED ORDER — CLOPIDOGREL BISULFATE 75 MG PO TABS: 75.0000 mg | ORAL_TABLET | Freq: Every day | ORAL | 4 refills | Status: DC

## 2018-05-25 NOTE — Patient Instructions (Signed)
Continue clopidogrel 75 mg daily  and crestor  for secondary stroke prevention  Continue to follow up with PCP regarding blood pressure and cholesterol management   Continue to monitor blood pressure at home  Mind exercises - cross word puzzles, word searches, continue to read paper  Maintain strict control of hypertension with blood pressure goal below 130/90, diabetes with hemoglobin A1c goal below 6.5% and cholesterol with LDL cholesterol (bad cholesterol) goal below 70 mg/dL. I also advised the patient to eat a healthy diet with plenty of whole grains, cereals, fruits and vegetables, exercise regularly and maintain ideal body weight.  Followup in the future with me in 6 months or call earlier if needed       Thank you for coming to see [place] at [facility]. I hope we have been able to provide you high quality care today.  You may receive a patient satisfaction survey over the next few weeks. We would appreciate your feedback and comments so that we may continue to improve ourselves and the health of our patients.

## 2018-05-25 NOTE — Progress Notes (Signed)
[facility] [address] [place]. [place] [address] [phone]       OFFICE FOLLOW-UP NOTE  Molly. Molly [name] Date of Birth:  10/06/1929 Medical Record Number:  [id number]   HPI: Molly [name] is 84 year old Caucasian lady seen today for first office follow-up visit following hospital admission for stroke in December 2018. She is accompanied by her daughter. History is obtained from them as well as review of electronic medical records. I have personally reviewed imaging films. Molly Foglemanis a 84 y.o.femalewith PMH of coronary artery disease, hyperlipidemia, hypertension, atrial fibrillation on no anticoagulationpresenting to the emergency room with a last known normal of 9:15 AM on 12/07/2017 withleft-sided weakness and inability to stand.NIHSS 10.CT-scan of the brain -no acute change. Aspects 10. CT Angio of head and neck no large vessel occlusion but distal right M3 stenosis versus clot..CT perfusion no cortical infarct but tissue at risk 9 mL CT perfusion study of the brain shows a small area of penumbra in the right MCA territory with no core. IV TPA given Wed 12/07/17. Episode is likely aborted stroke with right M2 stenosis. MRI scan did not show definite right hemispheric stroke but clinically she had left hemiparalysis complete the stent with a small right brain stroke not seen on MRI likely because patient got tPA. Vascular risk factors identified included atrial fibrillation, hypertension and hyperlipidemia as well as coronary artery disease and advanced age. Plavix was added to the patient's aspirin and statin dose was increased. She was transferred to inpatient rehabilitation from where she made gradual progress and is currently living at home.   02/22/18 visit Dr. Leonie Man: She is opting improvement in left-sided strength but still needs 1 person assist to get up and walk with a walker. She walks very limited. She fatigues very easily and gets tired.  She needs a person to walk close to her. She needs help with activities like dressing herself and feeding herself and cannot be left alone. The patient is getting home physical and occupational therapy and has finished patient therapy the daughters not satisfied and feels patient still has trouble with eating and pocketing on the left side and fatigues easily when sitting. There are plans to consider outpatient TEE and loop recorder for this patient but she has a history of epidural hematoma in the spine while she was on warfarin for DVT several years ago. She seems quite frail and his fall risk and hence I do not believe she is a good long-term candidate for anticoagulation since doing further workup with a TEE and loop recorder is not a good idea in my opinion. I have discussed this with the patient's daughter Molly [name] who is in agreement with my plan  05/25/18 UPDATE: Patient is being seen today for 94-month follow-up and is accompanied by her daughter and granddaughter.  After previous visit, is recommended to continue PT/OT but as patient was unable to afford outpatient visits it was recommended to continue home care but they were unable to obtain at that time due to orders not being placed.  Daughter stated that they decided just to wait until this follow-up visit to have an order placed.  Patient's complaining of increasing memory loss over the past 1 to 2 weeks.  Denies history of any memory loss.  AFT 12, recall 3/3, clock drawing 4/4 and able to do serial additions quickly.  Patient stated that her memory waxes and wanes and at times it can be getting other times she is unable to remember certain  things.  Advised patient that this is most likely due to age-related memory loss and encouraged to do mind exercises.  Patient also has complaining of generalized weakness and deconditioning. States her day consists of sitting in her chair and watching TV as it has been difficult for her to get around. Deconditioning  most likely and would benefit from continued therapies. Continues to take Plavix with mild bruising but no bleeding.  Continues to take Crestor without side effects myalgias.  Blood pressure today 141/69 but per daughter this is monitored at home and is normally lower.  She is using Rollator for ambulation but has used this for approximately 4 years now.  Denies new or worsening stroke/TIA symptoms.   ROS:   14 system review of systems is positive for weakness and memory loss and all other systems negative  PMH:  Past Medical History:  Diagnosis Date  . Acid reflux   . Anemia   . Arthritis   . CAD (coronary artery disease)   . High cholesterol   . Hyperlipidemia   . Hypertension   . Osteoporosis   . Spinal stenosis     Social History:  Social History   Socioeconomic History  . Marital status: Married    Spouse name: Not on file  . Number of children: Not on file  . Years of education: Not on file  . Highest education level: Not on file  Occupational History  . Not on file  Social Needs  . Financial resource strain: Not on file  . Food insecurity:    Worry: Not on file    Inability: Not on file  . Transportation needs:    Medical: Not on file    Non-medical: Not on file  Tobacco Use  . Smoking status: Never Smoker  . Smokeless tobacco: Never Used  Substance and Sexual Activity  . Alcohol use: No    Alcohol/week: 0.0 oz  . Drug use: No  . Sexual activity: Not on file  Lifestyle  . Physical activity:    Days per week: Not on file    Minutes per session: Not on file  . Stress: Not on file  Relationships  . Social connections:    Talks on phone: Not on file    Gets together: Not on file    Attends religious service: Not on file    Active member of club or organization: Not on file    Attends meetings of clubs or organizations: Not on file    Relationship status: Not on file  . Intimate partner violence:    Fear of current or ex partner: Not on file     Emotionally abused: Not on file    Physically abused: Not on file    Forced sexual activity: Not on file  Other Topics Concern  . Not on file  Social History Narrative  . Not on file    Medications:   Current Outpatient Medications on File Prior to Visit  Medication Sig Dispense Refill  . acetaminophen (TYLENOL) 500 MG tablet Take 500 mg by mouth every 8 (eight) hours as needed for mild pain.     . bisacodyl (DULCOLAX) 10 MG suppository Place 10 mg rectally as needed for moderate constipation.    . calcium-vitamin D (OSCAL WITH D) 500-200 MG-UNIT tablet Take 1 tablet by mouth 2 (two) times daily. 120 tablet 0  . denosumab (PROLIA) 60 MG/ML SOLN injection Inject 60 mg into the skin every 6 (six) months. Administer in upper  arm, thigh, or abdomen    . fexofenadine (ALLEGRA) 180 MG tablet Take 180 mg by mouth daily as needed for allergies or rhinitis.    Marland Kitchen isosorbide mononitrate (IMDUR) 30 MG 24 hr tablet TAKE 1 TABLET BY MOUTH TWO  TIMES DAILY 60 tablet 0  . metoprolol succinate (TOPROL-XL) 25 MG 24 hr tablet Take 0.5 tablets (12.5 mg total) by mouth daily with supper. 30 tablet 0  . Misc Natural Products (NARCOSOFT HERBAL LAX PO) Take 2 capsules by mouth 2 (two) times daily as needed (constipation).     . nitroGLYCERIN (NITROSTAT) 0.4 MG SL tablet Place 0.4 mg under the tongue every 5 (five) minutes as needed for chest pain (x 3 pills).     . pantoprazole (PROTONIX) 40 MG tablet Take 1 tablet (40 mg total) by mouth daily. 30 tablet 0  . Polysaccharide Iron Complex (NU-IRON PO) Take by mouth.    . Probiotic Product (ALIGN) 4 MG CAPS Take 4 mg by mouth daily.     . rosuvastatin (CRESTOR) 20 MG tablet Take 20 mg by mouth at bedtime.    . sennosides-docusate sodium (SENOKOT-S) 8.6-50 MG tablet Take 2 tablets by mouth 2 (two) times daily.     . TURMERIC PO Take 1 tablet by mouth daily.    . vitamin C (ASCORBIC ACID) 250 MG tablet Take 250 mg by mouth daily.     No current facility-administered  medications on file prior to visit.     Allergies:   Allergies  Allergen Reactions  . Buprenorphine Hcl Shortness Of Breath    Chest pain  . Codeine Nausea And Vomiting    ulcers  . Morphine And Related Shortness Of Breath    Chest pain, made me crazy  . Sulfacetamide Sodium Nausea And Vomiting  . Vicodin [Hydrocodone-Acetaminophen] Nausea And Vomiting  . Evista [Raloxifene] Other (See Comments) and Nausea And Vomiting    Upset stomach  . Hydromorphone Nausea And Vomiting    Sensitive  . Other Other (See Comments)    Narcotic-like medications - does NOT tolerate pain meds, causes unrelating volatile vomiting and drops in BP below 80/40 with dizziness and fainting  Tourniquet - Please place tourniquet over cloth - causes sever bruising.  Blood pressure cuff - please use manual blood pressure cuff - causes excoriating pain using machine and bruising     . Sulfur Nausea And Vomiting    Vomited until bleeding occurred and developed an active stomach ulcer  . Tape Other (See Comments)    Rips skin    Physical Exam General: cachectic frail elderly Caucasian lady, seated, in no evident distress Head: head normocephalic and atraumatic.  Neck: supple with no carotid or supraclavicular bruits Cardiovascular: regular rate and rhythm, no murmurs Musculoskeletal: no deformity except marked kyphosis and slight scoliosis to the right Skin:  no rash/petichiae Vascular:  Normal pulses all extremities ut 2+ pedal edema bilaterally Vitals:   05/25/18 1048  BP: (!) 141/69  Pulse: 62   Neurologic Exam Mental Status: Awake and fully alert. Oriented to place and time. Recent and remote memory diminished. Attention span, concentration and fund of knowledge diminished. Mood and affect appropriate.  AFT 12.  Recall 3/3.  Clock drawing 4/4. Cranial Nerves: Fundoscopic exam reveals sharp disc margins. Pupils equal, briskly reactive to light. Extraocular movements full without nystagmus. Visual  fields full to confrontation. Hearing diminished bilaterally. Facial sensation intact.mild left lower facial asymmetry., tongue, palate moves normally and symmetrically.  Motor: Generalized weakness noted in  all extremities (most likely related to deconditioning)  Sensory.: intact to touch ,pinprick .position and vibratory sensation.  Coordination: Rapid alternating movements normal in all extremities. Finger-to-nose and heel-to-shin performed accurately bilaterally.but slower on the left Gait and Station: Arises from chair with t difficulty. Stance is stoopedl. Requires a wheeled walker and 1 person assist to walk with weightbearing on the left side Reflexes: 1+ and symmetric. Toes downgoing.    ASSESSMENT: 84 year old patient with right MCA branch infarct in December 2018 of cryptogenic etiology with residual mild left hemiparesis. Patient returns today for re-evaluation.    PLAN: -Continue clopidogrel 75 mg daily  and crestor  for secondary stroke prevention -F/u with PCP regarding your HLD and HTN management -continue to monitor BP at home -placed order for home PT/OT and requesting [facility] -as memory complaints are likely more related to age-related, encouraged patient to do mind exercises such as card games, suduku, word search and cross word puzzles -Maintain strict control of hypertension with blood pressure goal below 130/90, diabetes with hemoglobin A1c goal below 6.5% and cholesterol with LDL cholesterol (bad cholesterol) goal below 70 mg/dL. I also advised the patient to eat a healthy diet with plenty of whole grains, cereals, fruits and vegetables, exercise regularly and maintain ideal body weight.  Follow up in 6 months or call earlier if needed  Greater than 50% of time during this 25 minute visit was spent on counseling,explanation of diagnosis of R MCA infarct, reviewing risk factor management of HLD and HTN, planning of further management, discussion with patient  and family and coordination of care  Venancio Poisson, [facility]  [facility] [address] [place], [place] [address]  Phone [phone] Fax [phone]

## 2018-05-26 NOTE — Progress Notes (Signed)
I agree with the above plan 

## 2018-06-07 ENCOUNTER — Telehealth: Payer: Self-pay | Admitting: Adult Health

## 2018-06-07 NOTE — Telephone Encounter (Signed)
Called and spoke to patient's daughter on 06/06/2018 and relayed [facility] could not take her mother on at this time because they are down in staffing . Relayed to Patient's daughter that I have reached out to [place] with [facility] and she should be contacted today about her mother when they will come out. I relayed to daughter if she has not hurd anything buy this Friday to please call me at [phone].   Lendon Ka Daughter [phone]    [facility] Daughter [phone]

## 2018-06-07 NOTE — Telephone Encounter (Signed)
Noted! Thank you

## 2018-06-09 ENCOUNTER — Telehealth: Payer: Self-pay | Admitting: Adult Health

## 2018-06-09 NOTE — Telephone Encounter (Signed)
Ok to give PT verbal order?

## 2018-06-09 NOTE — Telephone Encounter (Signed)
Yes please. Thank you

## 2018-06-09 NOTE — Telephone Encounter (Signed)
Roselie Awkward PT with [facility] requesting VO for 1x1, 2x3, 1x1. Roselie Awkward can be reached at [phone]

## 2018-06-09 NOTE — Telephone Encounter (Signed)
I spoke with Molly [name] and made him aware that it would be ok for PT orders requested. He voiced appreciation.

## 2018-06-28 ENCOUNTER — Telehealth: Payer: Self-pay | Admitting: Adult Health

## 2018-06-28 NOTE — Telephone Encounter (Signed)
OT Caprice Red @ [place] [facility] @[phone] has called for an extension  Of 1 visit(for next week) to continue with ADL Transfers & exercise DC OT when goals met or max potential.  OT Caprice Red says a voicemail can be left on his private secured voicemail if he is not available

## 2018-06-28 NOTE — Telephone Encounter (Signed)
Yes this is acceptable. Please advise. Thank you.

## 2018-06-28 NOTE — Telephone Encounter (Signed)
Called, LVM for Caprice Red giving VO for below order. Asked him to call back if he has further questions/concerns.

## 2018-07-15 ENCOUNTER — Encounter: Payer: Self-pay | Admitting: Adult Health

## 2018-07-24 ENCOUNTER — Ambulatory Visit: Payer: Medicare Other | Admitting: Podiatry

## 2018-07-24 ENCOUNTER — Encounter: Payer: Self-pay | Admitting: Podiatry

## 2018-07-24 DIAGNOSIS — M779 Enthesopathy, unspecified: Secondary | ICD-10-CM

## 2018-07-24 MED ORDER — TRIAMCINOLONE ACETONIDE 10 MG/ML IJ SUSP
10.0000 mg | Freq: Once | INTRAMUSCULAR | Status: AC
  Administered 2018-07-24: 10 mg

## 2018-07-27 NOTE — Progress Notes (Signed)
Subjective:   Patient ID: Molly [name], female   DOB: 84 y.o.   MRN: [id number]   HPI Patient states he is having a lot of pain in the first MPJ left   ROS      Objective:  Physical Exam  Recurrence of acute inflammation first MPJ left with pain upon palpation     Assessment:  Capsulitis first MPJ left     Plan:  Sterile prep and I then injected around the joint 3 mg Kenalog 5 mg Xylocaine and advised on continued range of motion exercises

## 2018-09-01 ENCOUNTER — Encounter: Payer: Self-pay | Admitting: Adult Health

## 2018-09-01 ENCOUNTER — Telehealth: Payer: Self-pay | Admitting: Interventional Cardiology

## 2018-09-01 NOTE — Telephone Encounter (Signed)
New Message:      Pt's daughter is calling and would like to know if the pt needs to have fasting labs on 12/12/18 and her yearly appt. She also states if we can send the results to her PCP so they won't have to draw for the same labs a week later at her f/u there.

## 2018-09-04 NOTE — Telephone Encounter (Signed)
Returned call to patient's daughter [name] (DPR on file). Made her aware that the patient had LIPIDS and LFTS checked in July 2018 that are available in [facility]. Made daughter aware that the patient does not have to have fasting labs prior to her appointment in December and that she can have them re-checked with her PCP when she see them again. She verbalized understanding and thanked me for the call.

## 2018-09-20 ENCOUNTER — Other Ambulatory Visit: Payer: Self-pay | Admitting: Podiatry

## 2018-09-20 ENCOUNTER — Ambulatory Visit: Payer: Medicare Other | Admitting: Podiatry

## 2018-09-20 ENCOUNTER — Ambulatory Visit (INDEPENDENT_AMBULATORY_CARE_PROVIDER_SITE_OTHER): Payer: Medicare Other

## 2018-09-20 ENCOUNTER — Encounter: Payer: Self-pay | Admitting: Podiatry

## 2018-09-20 DIAGNOSIS — M79672 Pain in left foot: Secondary | ICD-10-CM | POA: Diagnosis not present

## 2018-09-20 DIAGNOSIS — L84 Corns and callosities: Secondary | ICD-10-CM

## 2018-09-20 DIAGNOSIS — M2042 Other hammer toe(s) (acquired), left foot: Secondary | ICD-10-CM

## 2018-09-26 NOTE — Progress Notes (Signed)
Subjective:   Patient ID: Molly [name], female   DOB: 84 y.o.   MRN: [id number]   HPI Patient presents stating my left foot has been bothering the son with lesion formation is making it hard to walk points to the bottom of the first metatarsal fifth metatarsal   ROS      Objective:  Physical Exam  Neurovascular status unchanged with patient found to have chronic keratotic lesion sub-1 5 left is painful     Assessment:  Chronic lesion formation secondary to foot structure     Plan:  Debride painful lesions with no iatrogenic bleeding and reappoint for routine care

## 2018-10-16 ENCOUNTER — Other Ambulatory Visit: Payer: Self-pay | Admitting: Family Medicine

## 2018-10-16 DIAGNOSIS — Z1231 Encounter for screening mammogram for malignant neoplasm of breast: Secondary | ICD-10-CM

## 2018-11-23 ENCOUNTER — Ambulatory Visit: Payer: Medicare Other | Admitting: Adult Health

## 2018-11-29 ENCOUNTER — Ambulatory Visit: Payer: Medicare Other

## 2018-12-11 NOTE — Progress Notes (Signed)
Cardiology Office Note   Date:  12/12/2018   ID:  Dauna Ziska, DOB 04-28-1929, MRN [id number]  PCP:  [name], MD    No chief complaint on file.  CAD  Wt Readings from Last 3 Encounters:  12/12/18 71.3 kg  05/25/18 70.3 kg  02/22/18 70.8 kg       History of Present Illness: Molly [name] is a 84 y.o. female  who has had CAD, prior BMS to RCA. She had a mild abnormality on her stress test in 2012. She has been managed medically.  Prior to her stent, she had a chest pain that would not resolve.    She had back surgery in 2016.  It was very complicated.  She had a DVT and then was treated with heparin. There was apparent postoperative bleeding. She subsequent had a IVC filter placed. She was not a candidate for anticoagulation since that time. She did recover fairly well after the second surgery. She had some right foot drop postoperatively. Her pain was much better. During all this, she had no chest discomfort or any cardiac issues.  Her daughter adjusts Lasix for swelling. The patient has tolerated this well.  Her husband passed away just before he would have turned 14, in 2017.    She had a stroke in 12/18 and was treated with TPA.    Over the past 4 weeks, she has had weak spells with nausea and ches tpain.  They start with walking.  She has developed tremors.   In the past, she felt poorly with diuretics.  Weakness was worse.    Past Medical History:  Diagnosis Date  . Acid reflux   . Anemia   . Arthritis   . CAD (coronary artery disease)   . High cholesterol   . Hyperlipidemia   . Hypertension   . Osteoporosis   . Spinal stenosis     Past Surgical History:  Procedure Laterality Date  . ABDOMINAL HYSTERECTOMY    . BACK SURGERY     Rod and Pins Placed   . CARDIAC CATHETERIZATION N/A 11/17/2015   Procedure: Left Heart Cath and Coronary Angiography;  Surgeon: Jettie Booze, MD;  Location: [facility];  Service:  Cardiovascular;  Laterality: N/A;  . CAROTID STENT       Current Outpatient Medications  Medication Sig Dispense Refill  . acetaminophen (TYLENOL) 500 MG tablet Take 500 mg by mouth every 8 (eight) hours as needed for mild pain.     . bisacodyl (DULCOLAX) 10 MG suppository Place 10 mg rectally as needed for moderate constipation.    . calcium-vitamin D (OSCAL WITH D) 500-200 MG-UNIT tablet Take 1 tablet by mouth 2 (two) times daily. 120 tablet 0  . clopidogrel (PLAVIX) 75 MG tablet Take 1 tablet (75 mg total) by mouth daily. 90 tablet 4  . denosumab (PROLIA) 60 MG/ML SOLN injection Inject 60 mg into the skin every 6 (six) months. Administer in upper arm, thigh, or abdomen    . fexofenadine (ALLEGRA) 180 MG tablet Take 180 mg by mouth daily as needed for allergies or rhinitis.    Marland Kitchen gabapentin (NEURONTIN) 100 MG capsule     . isosorbide mononitrate (IMDUR) 30 MG 24 hr tablet TAKE 1 TABLET BY MOUTH TWO  TIMES DAILY (Patient taking differently: Take 15 mg by mouth daily. TAKE HALF TABLET BY MOUTH DAILY) 60 tablet 0  . metoprolol succinate (TOPROL-XL) 25 MG 24 hr tablet Take 0.5 tablets (12.5 mg total)  by mouth daily with supper. 30 tablet 0  . Misc Natural Products (NARCOSOFT HERBAL LAX PO) Take 2 capsules by mouth 2 (two) times daily as needed (constipation).     . nitroGLYCERIN (NITROSTAT) 0.4 MG SL tablet Place 0.4 mg under the tongue every 5 (five) minutes as needed for chest pain (x 3 pills).     . pantoprazole (PROTONIX) 40 MG tablet Take 1 tablet (40 mg total) by mouth daily. 30 tablet 0  . Polysaccharide Iron Complex (NU-IRON PO) Take by mouth.    . Probiotic Product (ALIGN) 4 MG CAPS Take 4 mg by mouth daily.     . rosuvastatin (CRESTOR) 20 MG tablet Take 20 mg by mouth at bedtime.    . sennosides-docusate sodium (SENOKOT-S) 8.6-50 MG tablet Take 2 tablets by mouth 2 (two) times daily.     . TURMERIC PO Take 1 tablet by mouth daily.    . vitamin C (ASCORBIC ACID) 250 MG tablet Take 250  mg by mouth daily.     No current facility-administered medications for this visit.     Allergies:   Buprenorphine hcl; Codeine; Morphine and related; Sulfacetamide sodium; Vicodin [hydrocodone-acetaminophen]; Evista [raloxifene]; Hydromorphone; Other; Sulfur; and Tape    Social History:  The patient  reports that she has never smoked. She has never used smokeless tobacco. She reports that she does not drink alcohol or use drugs.   Family History:  The patient's family history includes Anemia in her mother; Heart attack in her brother.    ROS:  Please see the history of present illness.   Otherwise, review of systems are positive for weakness.   All other systems are reviewed and negative.    PHYSICAL EXAM: VS:  BP 124/78   Pulse 66   Ht 5\' 2"  (1.575 m)   Wt 71.3 kg   LMP  (LMP Unknown)   SpO2 99%   BMI 28.75 kg/m  , BMI Body mass index is 28.75 kg/m. GEN: Well nourished, well developed, in no acute distress  HEENT: normal  Neck: no JVD, carotid bruits, or masses Cardiac: RRR; no murmurs, rubs, or gallops,no edema  Respiratory:  clear to auscultation bilaterally, normal work of breathing GI: soft, nontender, nondistended, + BS MS: no deformity or atrophy  Skin: warm and dry, no rash Neuro:  Strength and sensation are intact Psych: euthymic mood, full affect   EKG:   The ekg ordered today demonstrates NSR, septal Q waves, no ST segment   Recent Labs: 12/13/2017: ALT 22 12/19/2017: Platelets 204 02/01/2018: BUN 17; Creatinine, Ser 0.99; Hemoglobin 12.5; Potassium 3.9; Sodium 143   Lipid Panel    Component Value Date/Time   CHOL 133 12/08/2017 0309   TRIG 98 12/08/2017 0309   HDL 63 12/08/2017 0309   CHOLHDL 2.1 12/08/2017 0309   VLDL 20 12/08/2017 0309   LDLCALC 50 12/08/2017 0309     Other studies Reviewed: Additional studies/ records that were reviewed today with results demonstrating: LDL 73.   ASSESSMENT AND PLAN:  1. CAD: Weakness.  SOMe chest pain that  resolves spontaneously.  She is not a candidate for elective invasive testing at this time given her comorbidities.  Family is in agreement.  She will try isosorbide dinitrate, in the hopes that this will cause less weakness.  The daughter has been adjusting the doses of the Imdur and metoprolol trying to find a dosage and timing that do not result in weakness.  I think her weakness is likely multifactorial and  related to deconditioning, more so than from a purely cardiac source. 2. Chronic diastolic heart failure: She does have some volume overload.  She does not tolerate diuretics well.  They want to try to manage her volume status without any diuretics. 3. LE edema: She uses compression stocking and elevates her legs.  SHe does not tolerate diuretics well.  4. GERD: Improved. 5. Avoid falling.  Increased risk with foot drop.  I encouraged her to try to be active in the chair, doing exercises.  Her daughter encourages the same.   Current medicines are reviewed at length with the patient today.  The patient concerns regarding her medicines were addressed.  The following changes have been made:  No change  Labs/ tests ordered today include:  No orders of the defined types were placed in this encounter.   Recommend 150 minutes/week of aerobic exercise Low fat, low carb, high fiber diet recommended  Disposition:   FU in 1 year   Signed, Larae Grooms, MD  12/12/2018 2:32 PM    [facility] [place], [place], [place]  [address] Phone: [phone]; Fax: [phone]

## 2018-12-12 ENCOUNTER — Ambulatory Visit: Payer: Medicare Other | Admitting: Interventional Cardiology

## 2018-12-12 ENCOUNTER — Encounter: Payer: Self-pay | Admitting: Interventional Cardiology

## 2018-12-12 VITALS — BP 124/78 | HR 66 | Ht 62.0 in | Wt 157.2 lb

## 2018-12-12 DIAGNOSIS — R600 Localized edema: Secondary | ICD-10-CM

## 2018-12-12 DIAGNOSIS — I5032 Chronic diastolic (congestive) heart failure: Secondary | ICD-10-CM

## 2018-12-12 DIAGNOSIS — I2510 Atherosclerotic heart disease of native coronary artery without angina pectoris: Secondary | ICD-10-CM | POA: Diagnosis not present

## 2018-12-12 DIAGNOSIS — K219 Gastro-esophageal reflux disease without esophagitis: Secondary | ICD-10-CM

## 2018-12-12 MED ORDER — ISOSORBIDE DINITRATE 10 MG PO TABS: ORAL_TABLET | ORAL | 3 refills | Status: DC

## 2018-12-12 MED ORDER — NITROGLYCERIN 0.4 MG SL SUBL: 0.4000 mg | SUBLINGUAL_TABLET | SUBLINGUAL | 3 refills | Status: AC | PRN

## 2018-12-12 MED ORDER — NITROGLYCERIN 0.4 MG SL SUBL: 0.4000 mg | SUBLINGUAL_TABLET | SUBLINGUAL | 3 refills | Status: DC | PRN

## 2018-12-12 NOTE — Patient Instructions (Signed)
Medication Instructions:  Your physician has recommended you make the following change in your medication:   1. STOP: isosorbide mononitrate (imdur)  2. START: isosorbide dinitrate (isordil) 10 mg tablet: Take 1 tablet by mouth once a day AS NEEDED for chest discomfort  A refill was sent in for Sublingual Nitroglycerin as well   If you need a refill on your cardiac medications before your next appointment, please call your pharmacy.   Lab work: None Ordered  If you have labs (blood work) drawn today and your tests are completely normal, you will receive your results only by: Marland Kitchen MyChart Message (if you have MyChart) OR . A paper copy in the mail If you have any lab test that is abnormal or we need to change your treatment, we will call you to review the results.  Testing/Procedures: None ordered  Follow-Up: At [facility], you and your health needs are our priority.  As part of our continuing mission to provide you with exceptional heart care, we have created designated Provider Care Teams.  These Care Teams include your primary Cardiologist (physician) and Advanced Practice Providers (APPs -  Physician Assistants and Nurse Practitioners) who all work together to provide you with the care you need, when you need it. . You will need a follow up appointment in 1 year.  Please call our office 2 months in advance to schedule this appointment.  You may see Casandra Doffing, MD or one of the following Advanced Practice Providers on your designated Care Team:   . Lyda Jester, PA-C . Dayna Dunn, PA-C . Ermalinda Barrios, PA-C  Any Other Special Instructions Will Be Listed Below (If Applicable).

## 2018-12-24 ENCOUNTER — Other Ambulatory Visit: Payer: Self-pay | Admitting: Neurology

## 2019-01-23 ENCOUNTER — Ambulatory Visit: Payer: Medicare Other | Admitting: Neurology

## 2019-01-23 ENCOUNTER — Telehealth: Payer: Self-pay | Admitting: Neurology

## 2019-01-23 ENCOUNTER — Encounter: Payer: Self-pay | Admitting: Neurology

## 2019-01-23 VITALS — BP 135/72 | HR 60 | Ht 62.0 in | Wt 151.0 lb

## 2019-01-23 DIAGNOSIS — R441 Visual hallucinations: Secondary | ICD-10-CM

## 2019-01-23 MED ORDER — QUETIAPINE FUMARATE 25 MG PO TABS: 12.5000 mg | ORAL_TABLET | Freq: Every day | ORAL | 2 refills | Status: DC

## 2019-01-23 NOTE — Telephone Encounter (Signed)
Pt daughter [name] on [place]) stopped by the office stating she is unsure how to cut medication QUEtiapine (SEROQUEL) 25 MG tablet in half due to tablet being "so small" requesting a call to discuss a different MG dosage. Please advise

## 2019-01-23 NOTE — Patient Instructions (Signed)
I had a long discussion with the patient and her 2 daughters regarding her recent complaints of formed visual hallucinations mostly at night which appear to be quite disturbing.  These likely represent peduncular hallucinations but we need to rule out structural brain lesions.  I recommend trial of Seroquel 12.5 mg at night to help her sleep as well as help with hallucinations.  I have explained side effects with the patient and family and may increase the dose as necessary.  Also check complete metabolic panel, CBC, urine analysis and EEG.  Check MRI scan of the brain for structural brain lesion.  She was advised to continue to use her walker at all times for fall and safety precautions she will return for follow-up in 2 months or call earlier if necessary

## 2019-01-23 NOTE — Telephone Encounter (Signed)
Left vm for patients daughter laura about cutting seroquel in half. Advise to call back during business hours.

## 2019-01-23 NOTE — Progress Notes (Signed)
[facility] [address] [place]. [place] [address] [phone]       OFFICE FOLLOW-UP NOTE  Molly. Molly [name] Date of Birth:  10/06/1929 Medical Record Number:  [id number]   HPI: Molly [name] is 85 year old Caucasian lady seen today for first office follow-up visit following hospital admission for stroke in December 2018. She is accompanied by her daughter. History is obtained from them as well as review of electronic medical records. I have personally reviewed imaging films. Molly Foglemanis a 85 y.o.femalewith PMH of coronary artery disease, hyperlipidemia, hypertension, atrial fibrillation on no anticoagulationpresenting to the emergency room with a last known normal of 9:15 AM on 12/07/2017 withleft-sided weakness and inability to stand.NIHSS 10.CT-scan of the brain -no acute change. Aspects 10. CT Angio of head and neck no large vessel occlusion but distal right M3 stenosis versus clot..CT perfusion no cortical infarct but tissue at risk 9 mL CT perfusion study of the brain shows a small area of penumbra in the right MCA territory with no core. IV TPA given Wed 12/07/17. Episode is likely aborted stroke with right M2 stenosis. MRI scan did not show definite right hemispheric stroke but clinically she had left hemiparalysis complete the stent with a small right brain stroke not seen on MRI likely because patient got tPA. Vascular risk factors identified included atrial fibrillation, hypertension and hyperlipidemia as well as coronary artery disease and advanced age. Plavix was added to the patient's aspirin and statin dose was increased. She was transferred to inpatient rehabilitation from where she made gradual progress and is currently living at home.   02/22/18 visit Dr. Leonie Man: She is opting improvement in left-sided strength but still needs 1 person assist to get up and walk with a walker. She walks very limited. She fatigues very easily and gets tired.  She needs a person to walk close to her. She needs help with activities like dressing herself and feeding herself and cannot be left alone. The patient is getting home physical and occupational therapy and has finished patient therapy the daughters not satisfied and feels patient still has trouble with eating and pocketing on the left side and fatigues easily when sitting. There are plans to consider outpatient TEE and loop recorder for this patient but she has a history of epidural hematoma in the spine while she was on warfarin for DVT several years ago. She seems quite frail and his fall risk and hence I do not believe she is a good long-term candidate for anticoagulation since doing further workup with a TEE and loop recorder is not a good idea in my opinion. I have discussed this with the patient's daughter [name] who is in agreement with my plan  05/25/18 UPDATE: Patient is being seen today for 94-month follow-up and is accompanied by her daughter and granddaughter.  After previous visit, is recommended to continue PT/OT but as patient was unable to afford outpatient visits it was recommended to continue home care but they were unable to obtain at that time due to orders not being placed.  Daughter stated that they decided just to wait until this follow-up visit to have an order placed.  Patient's complaining of increasing memory loss over the past 1 to 2 weeks.  Denies history of any memory loss.  AFT 12, recall 3/3, clock drawing 4/4 and able to do serial additions quickly.  Patient stated that her memory waxes and wanes and at times it can be getting other times she is unable to remember certain  things.  Advised patient that this is most likely due to age-related memory loss and encouraged to do mind exercises.  Patient also has complaining of generalized weakness and deconditioning. States her day consists of sitting in her chair and watching TV as it has been difficult for her to get around. Deconditioning  most likely and would benefit from continued therapies. Continues to take Plavix with mild bruising but no bleeding.  Continues to take Crestor without side effects myalgias.  Blood pressure today 141/69 but per daughter this is monitored at home and is normally lower.  She is using Rollator for ambulation but has used this for approximately 4 years now.  Denies new or worsening stroke/TIA symptoms. Update 01/22/2019 : She is seen today urgently upon request from family.  She has had new complaints of formed visual hallucinations for the last 4 to 5 weeks.  These occur mostly at night and very rarely during the day.  She sees formed figures like bugs, snakes, insects crawling out of her mouth, ear or face.  She has as a result had very poor sleep.  At times she falls asleep and wakes up feeling scared.  As result of not sleeping well she feels quite exhausted and tired during the day.  She gets quite upset when she ceases objects.  She has not noticed any decrease in vision, her memory and cognition have also remained stable without worsening.  She is also had some depth perception issues and feels that when she is walking the floor is uneven.  She feels at times that her bathroom floor is carpeted when it is tired.  She is able to watch television and recognize faces and able to read also quite well without difficulty.  She feels overall weak all over and is currently getting home physical and occupational therapy.  She does have 24-hour care at home.  She at times gets confused and disoriented but this does not stay long.  Family is concerned about these.  She has had chronic tinnitus in the ears and hearing loss but feels of late this seems to have gotten worse and she has constant sound of her swarm of bees in her ears as well.  She has not had any recent brain imaging or lab work done in the last month.  She has not had any significant medication changes either.  There has been no recent head injury or loss of  consciousness or significant headaches or other focal neurological symptoms  ROS:   14 system review of systems is positive for weakness, memory loss ringing in the ears, fatigue, activity change, leg swelling, loss of vision, abdominal pain, restless leg, insomnia, joint and back pain and swelling, muscle cramps, walking difficulty, neck pain and stiffness, incontinence of bladder, easy bruising, dizziness, headache, weakness, tremors, confusion, decreased count concentration, nervousness anxiety depression and hallucinations and all other systems negative  PMH:  Past Medical History:  Diagnosis Date  . Acid reflux   . Anemia   . Arthritis   . CAD (coronary artery disease)   . High cholesterol   . Hyperlipidemia   . Hypertension   . Osteoporosis   . Spinal stenosis     Social History:  Social History   Socioeconomic History  . Marital status: Married    Spouse name: Not on file  . Number of children: Not on file  . Years of education: Not on file  . Highest education level: Not on file  Occupational History  .  Not on file  Social Needs  . Financial resource strain: Not on file  . Food insecurity:    Worry: Not on file    Inability: Not on file  . Transportation needs:    Medical: Not on file    Non-medical: Not on file  Tobacco Use  . Smoking status: Never Smoker  . Smokeless tobacco: Never Used  Substance and Sexual Activity  . Alcohol use: No    Alcohol/week: 0.0 standard drinks  . Drug use: No  . Sexual activity: Not on file  Lifestyle  . Physical activity:    Days per week: Not on file    Minutes per session: Not on file  . Stress: Not on file  Relationships  . Social connections:    Talks on phone: Not on file    Gets together: Not on file    Attends religious service: Not on file    Active member of club or organization: Not on file    Attends meetings of clubs or organizations: Not on file    Relationship status: Not on file  . Intimate partner  violence:    Fear of current or ex partner: Not on file    Emotionally abused: Not on file    Physically abused: Not on file    Forced sexual activity: Not on file  Other Topics Concern  . Not on file  Social History Narrative  . Not on file    Medications:   Current Outpatient Medications on File Prior to Visit  Medication Sig Dispense Refill  . acetaminophen (TYLENOL) 500 MG tablet Take 500 mg by mouth every 8 (eight) hours as needed for mild pain.     . bisacodyl (DULCOLAX) 10 MG suppository Place 10 mg rectally as needed for moderate constipation.    . Boswellia-Glucosamine-Vit D (OSTEO BI-FLEX ONE PER DAY PO) Take 2,000 Units by mouth 2 (two) times daily.    . calcium-vitamin D (OSCAL WITH D) 500-200 MG-UNIT tablet Take 1 tablet by mouth 2 (two) times daily. 120 tablet 0  . clopidogrel (PLAVIX) 75 MG tablet TAKE 1 TABLET BY MOUTH EVERY DAY 90 tablet 0  . denosumab (PROLIA) 60 MG/ML SOLN injection Inject 60 mg into the skin every 6 (six) months. Administer in upper arm, thigh, or abdomen    . fexofenadine (ALLEGRA) 180 MG tablet Take 180 mg by mouth daily as needed for allergies or rhinitis.    Marland Kitchen gabapentin (NEURONTIN) 100 MG capsule     . isosorbide dinitrate (ISORDIL) 10 MG tablet Take 1 tablet by mouth as needed for chest pain (Patient taking differently: Take 1 tablet by mouth as needed for chest pain take only if blood pressure is 140/88) 90 tablet 3  . metoprolol succinate (TOPROL-XL) 25 MG 24 hr tablet Take 0.5 tablets (12.5 mg total) by mouth daily with supper. 30 tablet 0  . Misc Natural Products (NARCOSOFT HERBAL LAX PO) Take 2 capsules by mouth 2 (two) times daily as needed (constipation).     . nitroGLYCERIN (NITROSTAT) 0.4 MG SL tablet Place 1 tablet (0.4 mg total) under the tongue every 5 (five) minutes as needed for chest pain (x 3 pills). 25 tablet 3  . Omega-3 Fatty Acids (FISH OIL) 1000 MG CAPS Take by mouth.    . pantoprazole (PROTONIX) 40 MG tablet Take 1 tablet (40  mg total) by mouth daily. 30 tablet 0  . Polysaccharide Iron Complex (NU-IRON PO) Take by mouth.    . Probiotic Product (  ALIGN) 4 MG CAPS Take 4 mg by mouth daily.     . rosuvastatin (CRESTOR) 20 MG tablet Take 20 mg by mouth at bedtime.    . TURMERIC PO Take 1 tablet by mouth daily.    . vitamin C (ASCORBIC ACID) 250 MG tablet Take 250 mg by mouth daily.     No current facility-administered medications on file prior to visit.     Allergies:   Allergies  Allergen Reactions  . Buprenorphine Hcl Shortness Of Breath    Chest pain  . Codeine Nausea And Vomiting    ulcers  . Morphine And Related Shortness Of Breath    Chest pain, made me crazy  . Sulfacetamide Sodium Nausea And Vomiting  . Vicodin [Hydrocodone-Acetaminophen] Nausea And Vomiting  . Evista [Raloxifene] Other (See Comments) and Nausea And Vomiting    Upset stomach  . Hydromorphone Nausea And Vomiting    Sensitive  . Other Other (See Comments)    Narcotic-like medications - does NOT tolerate pain meds, causes unrelating volatile vomiting and drops in BP below 80/40 with dizziness and fainting  Tourniquet - Please place tourniquet over cloth - causes sever bruising.  Blood pressure cuff - please use manual blood pressure cuff - causes excoriating pain using machine and bruising     . Sulfur Nausea And Vomiting    Vomited until bleeding occurred and developed an active stomach ulcer  . Tape Other (See Comments)    Rips skin    Physical Exam General: cachectic frail elderly Caucasian lady, seated, in no evident distress Head: head normocephalic and atraumatic.  Head and neck deviated to the right Neck: supple with no carotid or supraclavicular bruits Cardiovascular: regular rate and rhythm, no murmurs Musculoskeletal: no deformity except marked kyphosis and slight scoliosis to the right Skin:  no rash/petichiae Vascular:  Normal pulses all extremities ut 2+ pedal edema bilaterally Vitals:   01/23/19 1056  BP:  135/72  Pulse: 60   Neurologic Exam Mental Status: Awake and fully alert. Oriented to place and time. Recent and remote memory diminished. Attention span, concentration and fund of knowledge diminished. Mood and affect appropriate.  AFT 8.  Recall 2/3.  Clock drawing 3/4. Cranial Nerves: Fundoscopic exam reveals sharp disc margins. Pupils equal, briskly reactive to light. Extraocular movements full without nystagmus. Visual fields full to confrontation and able to read and write quite well. Hearing diminished bilaterally. Facial sensation intact.mild left lower facial asymmetry., tongue, palate moves normally and symmetrically.  Motor: Generalized weakness noted in all extremities (most likely related to deconditioning)  Sensory.: intact to touch ,pinprick .position and vibratory sensation.  Coordination: Rapid alternating movements normal in all extremities. Finger-to-nose and heel-to-shin performed accurately bilaterally.but slower on the left Gait and Station: Arises from chair with moderate difficulty. Stance is stooped and leaning to the right.. Requires a wheeled walker and 1 person assist to walk with weightbearing on the left side Reflexes: 1+ and symmetric. Toes downgoing.    ASSESSMENT: 85 year old patient with right MCA branch infarct in December 2018 of cryptogenic etiology with residual mild left hemiparesis. 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  PLAN: I had a long discussion with the patient and her 2 daughters regarding her recent complaints of formed visual hallucinations mostly at night which appear to be quite disturbing.  These likely represent peduncular hallucinations but we need to rule out structural brain lesions.  I recommend trial  of Seroquel 12.5 mg at night to help her sleep as well as help with hallucinations.  I have explained side effects with the patient and family and may  increase the dose as necessary.  Also check complete metabolic panel, CBC, urine analysis and EEG.  Check MRI scan of the brain for structural brain lesion.  She was advised to continue to use her walker at all times for fall and safety precautions she will return for follow-up in 2 months or call earlier if necessary Greater than 50% of time during this 25 minute visit was spent on counseling,explanation of diagnosis of peduncular hallucinations and stroke, reviewing risk factor management of HLD and HTN, planning of further management, discussion with patient and family and coordination of care  Venancio Poisson, [facility]  [facility] [address] [place], [place] [address]  Phone [phone] Fax [phone]

## 2019-01-24 ENCOUNTER — Telehealth: Payer: Self-pay | Admitting: Neurology

## 2019-01-24 ENCOUNTER — Encounter: Payer: Self-pay | Admitting: *Deleted

## 2019-01-24 LAB — URINALYSIS, ROUTINE W REFLEX MICROSCOPIC
Bilirubin, UA: NEGATIVE
Glucose, UA: NEGATIVE
Ketones, UA: NEGATIVE
Leukocytes, UA: NEGATIVE
Nitrite, UA: NEGATIVE
Protein, UA: NEGATIVE
RBC, UA: NEGATIVE
Specific Gravity, UA: 1.018 (ref 1.005–1.030)
Urobilinogen, Ur: 0.2 mg/dL (ref 0.2–1.0)
pH, UA: 6.5 (ref 5.0–7.5)

## 2019-01-24 NOTE — Telephone Encounter (Signed)
Left vmf for patient daughter to return phone call.

## 2019-01-24 NOTE — Telephone Encounter (Signed)
[employer] medicare order sent to [facility]. No auth they will reach out to the pt to schedule.  °

## 2019-01-25 LAB — CBC
Hematocrit: 38.3 % (ref 34.0–46.6)
Hemoglobin: 12.4 g/dL (ref 11.1–15.9)
MCH: 30.5 pg (ref 26.6–33.0)
MCHC: 32.4 g/dL (ref 31.5–35.7)
MCV: 94 fL (ref 79–97)
Platelets: 179 10*3/uL (ref 150–450)
RBC: 4.06 x10E6/uL (ref 3.77–5.28)
RDW: 12.7 % (ref 11.7–15.4)
WBC: 5.5 10*3/uL (ref 3.4–10.8)

## 2019-01-25 LAB — COMPREHENSIVE METABOLIC PANEL
ALT: 22 IU/L (ref 0–32)
AST: 27 IU/L (ref 0–40)
Albumin/Globulin Ratio: 2.3 — ABNORMAL HIGH (ref 1.2–2.2)
Albumin: 4.5 g/dL (ref 3.6–4.6)
Alkaline Phosphatase: 47 IU/L (ref 39–117)
BUN/Creatinine Ratio: 14 (ref 12–28)
BUN: 15 mg/dL (ref 8–27)
Bilirubin Total: 0.3 mg/dL (ref 0.0–1.2)
CO2: 24 mmol/L (ref 20–29)
Calcium: 9.7 mg/dL (ref 8.7–10.3)
Chloride: 106 mmol/L (ref 96–106)
Creatinine, Ser: 1.09 mg/dL — ABNORMAL HIGH (ref 0.57–1.00)
GFR calc Af Amer: 53 mL/min/{1.73_m2} — ABNORMAL LOW (ref >59)
GFR calc non Af Amer: 46 mL/min/{1.73_m2} — ABNORMAL LOW (ref >59)
Globulin, Total: 2 g/dL (ref 1.5–4.5)
Glucose: 87 mg/dL (ref 65–99)
Potassium: 4.5 mmol/L (ref 3.5–5.2)
Sodium: 146 mmol/L — ABNORMAL HIGH (ref 134–144)
Total Protein: 6.5 g/dL (ref 6.0–8.5)

## 2019-01-25 LAB — TSH: TSH: 3.01 u[IU]/mL (ref 0.450–4.500)

## 2019-01-25 LAB — VITAMIN B12: Vitamin B-12: 294 pg/mL (ref 232–1245)

## 2019-01-25 NOTE — Telephone Encounter (Signed)
Left another message on vm for [name] to try a pill cutter for the seroquel. Advise to call back if she has any questions.

## 2019-01-31 ENCOUNTER — Ambulatory Visit
Admission: RE | Admit: 2019-01-31 | Discharge: 2019-01-31 | Disposition: A | Discharge status: Home / Self Care | Payer: Medicare Other | Source: Ambulatory Visit | Attending: Neurology | Admitting: Neurology

## 2019-01-31 DIAGNOSIS — R441 Visual hallucinations: Secondary | ICD-10-CM

## 2019-01-31 MED ORDER — GADOBENATE DIMEGLUMINE 529 MG/ML IV SOLN
15.0000 mL | Freq: Once | INTRAVENOUS | Status: AC | PRN
  Administered 2019-01-31: 15 mL via INTRAVENOUS

## 2019-02-14 ENCOUNTER — Telehealth: Payer: Self-pay | Admitting: Neurology

## 2019-02-14 ENCOUNTER — Other Ambulatory Visit: Payer: Self-pay | Admitting: Neurology

## 2019-02-14 NOTE — Telephone Encounter (Signed)
Pts daughter [name] on [place] called stating that the medication that the pt was given QUEtiapine (SEROQUEL) 25 MG tablet half a tab. worked for the first week and now the pt is having trouble sleeping and having nightmares. Daughter would like to know if the dosage can be changed or what can be done. Please advise.

## 2019-02-14 NOTE — Telephone Encounter (Signed)
Dr.Sethi please advise thanks

## 2019-02-15 NOTE — Telephone Encounter (Signed)
Pt called for an update.    Please advise.

## 2019-02-16 NOTE — Telephone Encounter (Signed)
Pt daughter(on DPR) has called to inform that she has not hear back from RN re: her call made to the office on yesterday.  Daughter states the hallucinations are increasing.  Please call

## 2019-02-16 NOTE — Telephone Encounter (Signed)
Spoke with the patient's daughter's and stated that her hallucinations have increased at night around 10:30 pm the past couple of nights. She stated that she has been giving her mother the full QUEtiapine (SEROQUEL) 25 MG tablet instead of half tablet to help her mother sleep.

## 2019-02-16 NOTE — Telephone Encounter (Signed)
Ok to try Seroquel 25 mg for 3 days and if not better may need to increase the dose further to 37.5 mg

## 2019-02-19 ENCOUNTER — Telehealth: Payer: Self-pay | Admitting: Interventional Cardiology

## 2019-02-19 NOTE — Telephone Encounter (Signed)
Pt's daughter calling to ask if Dr. Irish Lack has seen the MyChart message from 02/13/19.   Daughter wants to know if the Pt (her Mom) still needs to be taking Crestor even though she is on other medications?  Daughter said [place] said Dr.V advised removing Crestor and monitoring the Pt's symptoms.  Daughter has not tried removing Crestor, because she  is unsure of what symptoms should be improving or what she should be looking for specifically  Doesn't want to give her mom too many meds, but also doesn't want to jeopardize her arteries. Please call to advise

## 2019-02-19 NOTE — Telephone Encounter (Signed)
Left mychart message for family to view on recommendations.

## 2019-02-19 NOTE — Telephone Encounter (Signed)
LEft vm for Molly [name] to give pt 25mg  seroquel for 3 days if no better increase to 37.5mg  at [facility]. I will send mychart message to pt or family who manages it.

## 2019-02-19 NOTE — Telephone Encounter (Signed)
Pt daughter has called RN Katrina back, the my chart message was read to daughter.  Daughter was made aware the message was per Dr Leonie Man.  No call back requested

## 2019-02-20 NOTE — Telephone Encounter (Signed)
Daughter calling back. She states that she had not taken her mother off of the crestor yet. She just wanted to clear up her question. She was asking if there were any medicines that the patient could stop in order to decrease the amount of pills that she is taking. She states that Dr. Laurann [place] had suggested that the crestor might be an option which is why she asked. She states that she thinks that the MyChart message regarding the hallucinations may have gotten mixed up with the question. She states that she does not want to cause harm by taking her mother off of the crestor, but wanted to get Dr. Hassell Done opinion on whether or not he feels strongly about her being on it and what he felt like the long term value was. Made her aware that I will discuss with Dr. Irish Lack and call her back with his recommendation.

## 2019-02-20 NOTE — Telephone Encounter (Signed)
Left message for daughter to call back.  

## 2019-02-21 NOTE — Telephone Encounter (Signed)
Called daughter and let her know that the patient should continue taking crestor. She verbalized understanding and thanked me for the call.

## 2019-02-28 ENCOUNTER — Other Ambulatory Visit: Payer: Self-pay

## 2019-02-28 ENCOUNTER — Ambulatory Visit: Payer: Medicare Other

## 2019-02-28 DIAGNOSIS — R4182 Altered mental status, unspecified: Secondary | ICD-10-CM | POA: Diagnosis not present

## 2019-02-28 DIAGNOSIS — R441 Visual hallucinations: Secondary | ICD-10-CM

## 2019-03-05 ENCOUNTER — Telehealth: Payer: Self-pay | Admitting: Neurology

## 2019-03-05 NOTE — Telephone Encounter (Signed)
Patients daughter called in and stated her hallucinations have increased shes now having them during the day, she was afraid now but she is more afraid now ,her daughter wants to know what can be done

## 2019-03-05 NOTE — Telephone Encounter (Signed)
Spoke to the patient's daughter who informed me that the Seroquel 50 mg at dinnertime seems to be helping but last few days after she has woken up in the morning she started complaining of visual hallucinations.  She is not too sleepy or drugged during the day.  I recommend we start Seroquel 12..5mg  in the morning for a few days and if not effective increase to 25 mg.  Continue 50 mg with dinner.  I also communicated results of recent lab work, EEG and MRI to her and she voiced understanding.

## 2019-03-07 ENCOUNTER — Telehealth: Payer: Self-pay | Admitting: Neurology

## 2019-03-07 NOTE — Telephone Encounter (Signed)
Pt's daughter Molly [name] on [place] states that the pt's hallucinations are worse today than ever. The medication change in the morning has not made a difference. Please advise.

## 2019-03-08 NOTE — Telephone Encounter (Signed)
Spoke to daughter advised increase seroquel to 25 mg in am and 50 mg at night. If this also is not enough may increase further

## 2019-03-14 ENCOUNTER — Other Ambulatory Visit: Payer: Self-pay

## 2019-03-14 ENCOUNTER — Telehealth: Payer: Self-pay | Admitting: Neurology

## 2019-03-14 DIAGNOSIS — R443 Hallucinations, unspecified: Secondary | ICD-10-CM

## 2019-03-14 MED ORDER — QUETIAPINE FUMARATE 25 MG PO TABS: ORAL_TABLET | ORAL | 1 refills | Status: DC

## 2019-03-14 NOTE — Progress Notes (Signed)
Referral placed for palliative care urgent for hallucinations.

## 2019-03-14 NOTE — Telephone Encounter (Signed)
Pt daughter(on DPR) has called to inform that re: the QUEtiapine (SEROQUEL) 25 MG tablet and the increased dosage recently prescribed pt will need a new script sent into pharmacy-[id number]

## 2019-03-14 NOTE — Telephone Encounter (Signed)
Refill sent to [facility] pharmacy.

## 2019-03-19 ENCOUNTER — Telehealth: Payer: Self-pay

## 2019-03-19 NOTE — Telephone Encounter (Signed)
Received phone call from [facility] with Dr. Clydene Fake office regarding an urgent referral for palliative care. Phone call placed to daughter to introduce Palliative Care and to offer to schedule a visit. Due to the current COVID-19 infection/crises, the patient and family prefer, and have given their verbal consent for, a provider visit via telemedicine. HIPPA policies of confidentially were discussed and daughter, Pleas Koch, expressed understanding. Visit scheduled for 03/22/2019 @ 4pm

## 2019-03-19 NOTE — Telephone Encounter (Signed)
Left vm for Molly [name] at [employer] [facility] at [phone].I stated a urgent referral was sent 03/15/2019.

## 2019-03-19 NOTE — Telephone Encounter (Signed)
Spoke to [employer] she will pull referral and call family today thanks Hinton Dyer.

## 2019-03-22 ENCOUNTER — Other Ambulatory Visit: Payer: Self-pay

## 2019-03-22 ENCOUNTER — Other Ambulatory Visit: Payer: Medicare Other | Admitting: Internal Medicine

## 2019-03-22 ENCOUNTER — Encounter: Payer: Self-pay | Admitting: Internal Medicine

## 2019-03-22 DIAGNOSIS — Z515 Encounter for palliative care: Secondary | ICD-10-CM

## 2019-03-22 NOTE — Progress Notes (Signed)
April 9th, 2020 [facility] Consult Note Telephone: [phone]  Fax: [phone]  Due to the current COVID-19 infection/crises, the patient and family prefer, and have given their verbal consent for, a provider visit via telemedicine. HIPPA policies of confidentially were discussed and daughter, Molly [name], who expressed understanding.   PATIENT NAME: Molly [name] DOB: Apr 22, 1929 MRN: [id number]  PRIMARY CARE PROVIDER:   [name], MD [place] [employer] [facility] [employer] [place], [place] [address]  REFERRING PROVIDER: Marcelino [name] (Neurology). Established 01/23/2018. F/u from stroke 11/2017  RESPONSIBLE PARTY: (dtr) Molly [name] [phone] e-mail: [email or web address]. (dtr) Molly [name], e-mail:  [email or web address]   ASSESSMENT:      1. Visual, auditory, and tactile hallucinations described a squishy worms, spiders, spider webs crawling about on her. Recognizes they are not real but extremely distressing. No change with distractions. Neuro w/u by Dr. Leonie Man included brain MRI (mild age-appropriate changes of shrinkage of the brain and hardening of the arteries), lab work (complete metabolic panel, CBC, urine analysis) and EEG (nl) were unenlightening. Low dose Seroquel was initiated in Feb, but despite gradual dose increase, her symptoms have escalated so that she is bothered about 60% of her day. Family's dosing of the Seroquel was somewhat erratic, with some days just taking 50mg , some days up to 175mg .  -Discussed with daughters using a consistent Seroquel dosing. Recommended 50mg  bid with 25mg  bid prn breakthrough episodes.  2. Slow functional decline: Patient is lucid, A & O x 3. She is a 1-2 person assist to stand, with guarding assist to ambulate about 10 feet with rollator walker. She is slower in her movements and has developed a right arm/hand tremor exacerbated when fatigued. She is dependent for hygiene and dressing. She is  occasionally incontinent of bowel and bladder. Tendency towards constipation managed with prn OTC laxatives. She waxes and wanes in need for cueing assist. She is able to feed herself. Intake of 50-75% of 2-3 meals/day. Her weight is stable at 157 lbs. At a height of 5'2" her BMI is 27.6  3. Advanced Care Directives: daughters and patient confirmed DNR status. They do not have the golden yellow-rod form in the home. They are interested in completing the MOST form, after they have had a chance to think about and discuss the specifics of each section. -I popped two signed copies of the DNR form into the mail, as well as a blank MOST form and an accompanying patient education sheet. I sent an e-mail to the daughters with this info, as well.   3. F/U: Telehealth visit scheduled for Mon 03/26/2019 at 2pm, to assess for degree of symptom management on consistent Seroquel dosing, with f/u TC to Dr. Leonie Man for management suggestions thereafter.  I spent 60 minutes providing this consultation,  from 4pm to 5pm. More than 50% of the time in this consultation was spent coordinating communication.   HISTORY OF PRESENT ILLNESS:  Molly [name] is an 85 y.o. year old female with medical h/o CAD, HLD, HTN, and osteoporosis. Palliative Care was asked to help with symptom management and to address goals of care.   CODE STATUS: DNR  PPS: 30% HOSPICE ELIGIBILITY/DIAGNOSIS: TBD  PAST MEDICAL HISTORY:  Past Medical History:  Diagnosis Date  . Acid reflux   . Anemia   . Arthritis   . CAD (coronary artery disease)   . High cholesterol   . Hyperlipidemia   . Hypertension   . Osteoporosis   . Spinal stenosis  SOCIAL HX:  Social History   Tobacco Use  . Smoking status: Never Smoker  . Smokeless tobacco: Never Used  Substance Use Topics  . Alcohol use: No    Alcohol/week: 0.0 standard drinks    ALLERGIES:  Allergies  Allergen Reactions  . Buprenorphine Hcl Shortness Of Breath    Chest pain  .  Codeine Nausea And Vomiting    ulcers  . Morphine And Related Shortness Of Breath    Chest pain, made me crazy  . Sulfacetamide Sodium Nausea And Vomiting  . Vicodin [Hydrocodone-Acetaminophen] Nausea And Vomiting  . Evista [Raloxifene] Other (See Comments) and Nausea And Vomiting    Upset stomach  . Hydromorphone Nausea And Vomiting    Sensitive  . Other Other (See Comments)    Narcotic-like medications - does NOT tolerate pain meds, causes unrelating volatile vomiting and drops in BP below 80/40 with dizziness and fainting  Tourniquet - Please place tourniquet over cloth - causes sever bruising.  Blood pressure cuff - please use manual blood pressure cuff - causes excoriating pain using machine and bruising     . Sulfur Nausea And Vomiting    Vomited until bleeding occurred and developed an active stomach ulcer  . Tape Other (See Comments)    Rips skin     PERTINENT MEDICATIONS:  Outpatient Encounter Medications as of 03/22/2019  Medication Sig  . acetaminophen (TYLENOL) 500 MG tablet Take 500 mg by mouth every 8 (eight) hours as needed for mild pain.   . bisacodyl (DULCOLAX) 10 MG suppository Place 10 mg rectally as needed for moderate constipation.  . Boswellia-Glucosamine-Vit D (OSTEO BI-FLEX ONE PER DAY PO) Take 2,000 Units by mouth 2 (two) times daily.  . calcium-vitamin D (OSCAL WITH D) 500-200 MG-UNIT tablet Take 1 tablet by mouth 2 (two) times daily.  . clopidogrel (PLAVIX) 75 MG tablet TAKE 1 TABLET BY MOUTH EVERY DAY  . denosumab (PROLIA) 60 MG/ML SOLN injection Inject 60 mg into the skin every 6 (six) months. Administer in upper arm, thigh, or abdomen  . fexofenadine (ALLEGRA) 180 MG tablet Take 180 mg by mouth daily as needed for allergies or rhinitis.  Marland Kitchen gabapentin (NEURONTIN) 100 MG capsule   . isosorbide dinitrate (ISORDIL) 10 MG tablet Take 1 tablet by mouth as needed for chest pain (Patient taking differently: Take 1 tablet by mouth as needed for chest pain take only  if blood pressure is 140/88)  . metoprolol succinate (TOPROL-XL) 25 MG 24 hr tablet Take 0.5 tablets (12.5 mg total) by mouth daily with supper.  . Misc Natural Products (NARCOSOFT HERBAL LAX PO) Take 2 capsules by mouth 2 (two) times daily as needed (constipation).   . nitroGLYCERIN (NITROSTAT) 0.4 MG SL tablet Place 1 tablet (0.4 mg total) under the tongue every 5 (five) minutes as needed for chest pain (x 3 pills).  . Omega-3 Fatty Acids (FISH OIL) 1000 MG CAPS Take by mouth.  . pantoprazole (PROTONIX) 40 MG tablet Take 1 tablet (40 mg total) by mouth daily.  . Polysaccharide Iron Complex (NU-IRON PO) Take by mouth.  . Probiotic Product (ALIGN) 4 MG CAPS Take 4 mg by mouth daily.   . QUEtiapine (SEROQUEL) 25 MG tablet Take 25mg  in the morning and 50 at QHS  . rosuvastatin (CRESTOR) 20 MG tablet Take 20 mg by mouth at bedtime.  . TURMERIC PO Take 1 tablet by mouth daily.  . vitamin C (ASCORBIC ACID) 250 MG tablet Take 250 mg by mouth daily.  No facility-administered encounter medications on file as of 03/22/2019.     PHYSICAL EXAM: Limited d/t telehealth visit Well nourished elderly female who is pleasantly conversant though flat affect and minimal facial expressions. She appears fatigued.  Two of her daughters are in attendance. Pulmonary: no signs of respiratory distress Extremities: no edema, no joint deformities Skin: no rashes exposed skin Neurological: Generalized weakness; low frequency tremor right wrist and hand  Julianne Handler, NP

## 2019-03-26 ENCOUNTER — Other Ambulatory Visit: Payer: Self-pay

## 2019-03-26 ENCOUNTER — Encounter: Payer: Self-pay | Admitting: Internal Medicine

## 2019-03-26 ENCOUNTER — Other Ambulatory Visit: Payer: Medicare Other | Admitting: Internal Medicine

## 2019-03-26 DIAGNOSIS — Z515 Encounter for palliative care: Secondary | ICD-10-CM

## 2019-03-26 NOTE — Progress Notes (Addendum)
April 13th, 2020 [facility] Consult Note Telephone: [phone]  Fax: [phone]  Due to the current COVID-19 infection/crises, the patient and family prefer, and have given their verbal consent for, a provider visit via telemedicine, from my office. HIPPA policies of confidentially were discussed.  PATIENT NAME: Molly [name] DOB: 1929-06-06 MRN: [id number]  PRIMARY CARE PROVIDER:   [name], MD [place] [employer] [place], [place] [address]  REFERRING PROVIDER: Marcelino [name] (Neurology). Established 01/23/2018. F/u from stroke 11/2017  RESPONSIBLE PARTY: (dtr) Molly [name] [phone] e-mail: [email or web address]. (dtr) Molly [name], e-mail:  [email or web address]   ASSESSMENT:      1. Visual, auditory, and tactile hallucinations: Progression of symptoms to approximately 80% (up from about 60%) of the day despite a more consistent dosing of her Serquel to 50 mg bid with 25 mg bid for break through symptoms. She has been getting both doses of the breakthrough meds/day.   2. Slow functional decline: Continues lucid and oriented. Continues to be dependent in transfers, hygiene, and stand by assist with rollator walker. RUE tremor is stable. Can feed herself. Family report her stamina is less and her movement slower.  3. Advanced Care Directives: DNR(uploaded onto VYNCA in [place]). MOST form discussions ongoing.  3. F/U: Telehealth visit scheduled for Mon 04/02/2019 at 2pm, to f/u on Dr.Sethi recommendations. Will complete MOST form, if family wishes to do so.   I spent 30 minutes providing this consultation,  from 2pm to 2:20pm. More than 50% of the time in this consultation was spent coordinating communication.   HISTORY OF PRESENT ILLNESS:  Molly [name] is an 85 y.o. year old female with medical h/o CAD, HLD, HTN, and osteoporosis. This is a f/u Palliative Care visit from 03/22/2019 to help with symptom  management and ongoing discussions re: goals of care.   CODE STATUS: DNR  PPS: 30% HOSPICE ELIGIBILITY/DIAGNOSIS: TBD  PAST MEDICAL HISTORY:  Past Medical History:  Diagnosis Date  . Acid reflux   . Anemia   . Arthritis   . CAD (coronary artery disease)   . High cholesterol   . Hyperlipidemia   . Hypertension   . Osteoporosis   . Spinal stenosis     SOCIAL HX:  Social History   Tobacco Use  . Smoking status: Never Smoker  . Smokeless tobacco: Never Used  Substance Use Topics  . Alcohol use: No    Alcohol/week: 0.0 standard drinks    ALLERGIES:  Allergies  Allergen Reactions  . Buprenorphine Hcl Shortness Of Breath    Chest pain  . Codeine Nausea And Vomiting    ulcers  . Morphine And Related Shortness Of Breath    Chest pain, made me crazy  . Sulfacetamide Sodium Nausea And Vomiting  . Vicodin [Hydrocodone-Acetaminophen] Nausea And Vomiting  . Evista [Raloxifene] Other (See Comments) and Nausea And Vomiting    Upset stomach  . Hydromorphone Nausea And Vomiting    Sensitive  . Other Other (See Comments)    Narcotic-like medications - does NOT tolerate pain meds, causes unrelating volatile vomiting and drops in BP below 80/40 with dizziness and fainting  Tourniquet - Please place tourniquet over cloth - causes sever bruising.  Blood pressure cuff - please use manual blood pressure cuff - causes excoriating pain using machine and bruising     . Sulfur Nausea And Vomiting    Vomited until bleeding occurred and developed an active stomach ulcer  . Tape Other (See Comments)  Rips skin     PERTINENT MEDICATIONS:  Outpatient Encounter Medications as of 03/26/2019  Medication Sig  . acetaminophen (TYLENOL) 500 MG tablet Take 500 mg by mouth every 8 (eight) hours as needed for mild pain.   . bisacodyl (DULCOLAX) 10 MG suppository Place 10 mg rectally as needed for moderate constipation.  . Boswellia-Glucosamine-Vit D (OSTEO BI-FLEX ONE PER DAY PO) Take 2,000  Units by mouth 2 (two) times daily.  . calcium-vitamin D (OSCAL WITH D) 500-200 MG-UNIT tablet Take 1 tablet by mouth 2 (two) times daily.  . clopidogrel (PLAVIX) 75 MG tablet TAKE 1 TABLET BY MOUTH EVERY DAY  . denosumab (PROLIA) 60 MG/ML SOLN injection Inject 60 mg into the skin every 6 (six) months. Administer in upper arm, thigh, or abdomen  . fexofenadine (ALLEGRA) 180 MG tablet Take 180 mg by mouth daily as needed for allergies or rhinitis.  Marland Kitchen gabapentin (NEURONTIN) 100 MG capsule 100 mg. Takes as needed; patient has not taken since she started the seroquel. Takes prn if pain  . isosorbide dinitrate (ISORDIL) 10 MG tablet Take 1 tablet by mouth as needed for chest pain (Patient taking differently: Take 1 tablet by mouth as needed for chest pain take only if blood pressure is 140/88)  . metoprolol succinate (TOPROL-XL) 25 MG 24 hr tablet Take 0.5 tablets (12.5 mg total) by mouth daily with supper.  . nitroGLYCERIN (NITROSTAT) 0.4 MG SL tablet Place 1 tablet (0.4 mg total) under the tongue every 5 (five) minutes as needed for chest pain (x 3 pills).  . Omega-3 Fatty Acids (FISH OIL) 1000 MG CAPS Take by mouth.  . pantoprazole (PROTONIX) 40 MG tablet Take 1 tablet (40 mg total) by mouth daily.  . Polysaccharide Iron Complex (NU-IRON PO) Take by mouth.  . Probiotic Product (ALIGN) 4 MG CAPS Take 4 mg by mouth daily.   . QUEtiapine (SEROQUEL) 25 MG tablet Take 25mg  in the morning and 50 at QHS (Patient taking differently: Taking as much as 175mg  in a 24 hr period.)  . rosuvastatin (CRESTOR) 20 MG tablet Take 20 mg by mouth at bedtime.  . TURMERIC PO Take 1 tablet by mouth daily.  . vitamin C (ASCORBIC ACID) 250 MG tablet Take 250 mg by mouth daily.   No facility-administered encounter medications on file as of 03/26/2019.     PHYSICAL EXAM: Limited d/t nature of telehealth visit  Well nourished elderly female who is pleasantly conversant though flat affect and minimal facial expressions. She  appears fatigued.  Two of her daughters are in attendance. Pulmonary: no signs of respiratory distress Extremities: no edema, no joint deformities Skin: no rashes exposed skin Neurological: Generalized weakness; low frequency tremor right wrist and hand  Julianne Handler, NP

## 2019-03-27 ENCOUNTER — Other Ambulatory Visit: Payer: Self-pay

## 2019-03-27 ENCOUNTER — Telehealth: Payer: Self-pay | Admitting: Internal Medicine

## 2019-03-27 DIAGNOSIS — R443 Hallucinations, unspecified: Secondary | ICD-10-CM

## 2019-03-27 NOTE — Telephone Encounter (Signed)
03/27/2019 12:45pm: Telephone message left for Dr. Clydene Fake nurse. Patient seens (telemedicine) on 03/22/2019. I requested family to keep patient on a consistent dose of Seroquel 50mg  bid with 25mg  bid prn breakthrough symptoms of visual and tactile hallucinations. I followed up with the family 03/26/2019. Daughters report increased symptoms (now occurring about 80% of the time vs 60% of the time).  I will f/u with family regarding Dr. Clydene Fake suggestions.  Violeta Gelinas NP-C [phone]

## 2019-03-27 NOTE — Telephone Encounter (Signed)
I spoke with Molly [name] from palliative care about pts medications. I stated per Dr. Leonie Man he does not have another med of choice for pt because she is high risk because of her age and other medical problems. I stated the seroquel dosage has been change several times over the past month. The last change was 25 am and 50 at [facility] on 03/14/2019. Molly [name] stated that the palliative care medical provider did not have any other recommendations for another med choice. The palliative care Md wanted Dr. Leonie Man to make the recommendation of another med choice. I stated Dr Leonie Man recommend  In March maybe pt needs to see a geriatric psychiatrist for the increase hallucinations. Molly [name] verbalized understanding and knows it will be sent to Dr. Leonie Man for urgent referral of hallucinations for geriatric psychiatrist.

## 2019-03-27 NOTE — Progress Notes (Signed)
Urgent referral to psychiatrist for increase hallucinations.

## 2019-03-27 NOTE — Telephone Encounter (Signed)
Violeta Gelinas from [facility] called and stated she saw the patient on April 9 , her seroquel dosing 50mg  bid with 25mg  bid prn which she did take prn dosage everyday ,she saw her Aoril 13 her tactile and visual hallucinations are worse , is it okay to d/c seroquel and switch to another med of choice   CB# [phone]

## 2019-03-27 NOTE — Telephone Encounter (Signed)
Referral put in as urgent per Dr. Leonie Man to geriatric psychiatrist .

## 2019-03-27 NOTE — Telephone Encounter (Signed)
Yes please

## 2019-03-27 NOTE — Telephone Encounter (Signed)
3 pm:   Return TC from [place] (office of Dr. Clydene Fake office). Katrina reports Dr. Clydene Fake wish to refer patient to a Geriatric Psychiatrist for further management for tactile and visual hallucinations. They will reach out and call patient's family with that referral. I will call daughters with update.  Violeta Gelinas NP-C [phone]

## 2019-03-28 ENCOUNTER — Telehealth: Payer: Self-pay

## 2019-03-28 ENCOUNTER — Telehealth: Payer: Self-pay | Admitting: Neurology

## 2019-03-28 NOTE — Telephone Encounter (Signed)
Referral has been sent as Urgent

## 2019-03-28 NOTE — Telephone Encounter (Signed)
Called and spoke to [place] Dr. Casimiro Needle assistant and he is reviewing all referral's . Per Elmyra Ricks fax referral to [phone] - telephone [phone].

## 2019-03-28 NOTE — Telephone Encounter (Signed)
I called pts daughter Ander Purpura about visit being change to video due to COVID 19. I explain a valid email is needed, and a device with a camera for the visit. I receive verbal consent from Ander Purpura to do video and file insurance. I explain pt has to be present during the video. She was given the web ex to download and verbalized understanding.

## 2019-03-28 NOTE — Telephone Encounter (Signed)
Referral has been sent. Dr. Casimiro Needle will view and get back to use.

## 2019-03-30 ENCOUNTER — Telehealth: Payer: Self-pay | Admitting: Licensed Clinical Social Worker

## 2019-03-30 NOTE — Telephone Encounter (Signed)
Palliative Care SW left a message for patient's daughter, [name], per the request of NP, Violeta Gelinas.

## 2019-04-02 ENCOUNTER — Other Ambulatory Visit: Payer: Medicare Other | Admitting: Internal Medicine

## 2019-04-02 ENCOUNTER — Encounter: Payer: Self-pay | Admitting: Internal Medicine

## 2019-04-02 ENCOUNTER — Other Ambulatory Visit: Payer: Self-pay

## 2019-04-02 DIAGNOSIS — Z515 Encounter for palliative care: Secondary | ICD-10-CM

## 2019-04-02 NOTE — Progress Notes (Signed)
April 20th, 2020 [facility] Consult Note Telephone: [phone]  Fax: [phone]  Due to the current COVID-19 infection/crises, the patient and family prefer, and have given their verbal consent for, a provider visit via telemedicine, from my office. HIPPA policies of confidentially were discussed.  PATIENT NAME:Molly [name] DOB:1929/01/09 XTG:[id number]  PRIMARY CARE PROVIDER:Griffin, Margaretha [address], [address] [place], [place] [address]  [place] (Neurology).  Referral pending Dr. Norma Fredrickson (apt May 20)  RESPONSIBLE PARTY:*(dtr) Molly [name] [phone] e-mail: [email or web address]. (dtr) Ms Molly [name], e-mail: [email or web address]   1.Visual, auditory, and tactile hallucinations: Daughter Molly [name] has been giving patient Seroqula 50mg  bid, with prn 50 mg qd to bid. Molly [name] reports tactile and visual hallucinations are transiently improved for as long as 3 hours after prn dosing, but patient is also experiencing increased weakness, decreased energy level, and fatigue, which she wonders if is side effect form the Seroquel. As a result, It takes longer to provide personal care. Dr. Clydene Fake office has arranged for psych referral to Dr. Casimiro Needle, and appointment is scheduled for May 20th. Family anticipates a telehealth visit with Dr. Leonie Man within the next few days, as well. Family does try distraction techniques. Patient enjoys virtual bowling on [place] which she does on a daily basis; not as distracting from symptoms as previously.  -we discussed using tactile distraction techniques (having daughter rub or tap patient's arm and concentrate on that sensation/counting number of taps). Patient copes by hoping for better days ahead, with possible mitigating solutions by her health care providers. -LCSW Lynnn Duffy has reached out for supporative counseling; message left.   2.Slow  functional decline: Continues lucid and oriented. Continues to be dependent in transfers, hygiene, and stand by assist with rollator walker. RUE tremor is stable. Can feed herself. Family report her stamina is less and her movement slower. Needs increased help and not walking as much as was. Excerbation of R LE foot drop.  3. Advanced Care Directives: DNR (uploaded onto VYNCA in [place]). Family wish to defer discussion of MOST form for now.  4. F/U: Telehealth visit scheduled for Mon 04/09/2019 at 2pm, to f/u on Dr.Sethi recommendations.   I spent34minutes providing this consultation, from 2pmto 2:30pm. More than 50% of the time in this consultation was spent coordinating communication.   HISTORY OF PRESENT ILLNESS:Molly [name] 85 y.o.year oldfemalewith medical h/o CAD, HLD, HTN, and osteoporosis. This is a f/u Palliative Care visit from 03/22/2019 to helpwith symptom management and ongoing discussions re: goals of care.   CODE STATUS:DNR  PPS:30% HOSPICE ELIGIBILITY/DIAGNOSIS:TBD  PAST MEDICAL HISTORY:  Past Medical History:  Diagnosis Date  . Acid reflux   . Anemia   . Arthritis   . CAD (coronary artery disease)   . High cholesterol   . Hyperlipidemia   . Hypertension   . Osteoporosis   . Spinal stenosis     SOCIAL HX:  Social History   Tobacco Use  . Smoking status: Never Smoker  . Smokeless tobacco: Never Used  Substance Use Topics  . Alcohol use: No    Alcohol/week: 0.0 standard drinks    ALLERGIES:  Allergies  Allergen Reactions  . Buprenorphine Hcl Shortness Of Breath    Chest pain  . Codeine Nausea And Vomiting    ulcers  . Morphine And Related Shortness Of Breath    Chest pain, made me crazy  . Sulfacetamide Sodium Nausea And Vomiting  . Vicodin [Hydrocodone-Acetaminophen] Nausea And Vomiting  .  Evista [Raloxifene] Other (See Comments) and Nausea And Vomiting    Upset stomach  . Hydromorphone Nausea And Vomiting    Sensitive  .  Other Other (See Comments)    Narcotic-like medications - does NOT tolerate pain meds, causes unrelating volatile vomiting and drops in BP below 80/40 with dizziness and fainting  Tourniquet - Please place tourniquet over cloth - causes sever bruising.  Blood pressure cuff - please use manual blood pressure cuff - causes excoriating pain using machine and bruising     . Sulfur Nausea And Vomiting    Vomited until bleeding occurred and developed an active stomach ulcer  . Tape Other (See Comments)    Rips skin     PERTINENT MEDICATIONS:  Outpatient Encounter Medications as of 04/02/2019  Medication Sig  . acetaminophen (TYLENOL) 500 MG tablet Take 500 mg by mouth every 8 (eight) hours as needed for mild pain.   . bisacodyl (DULCOLAX) 10 MG suppository Place 10 mg rectally as needed for moderate constipation.  . Boswellia-Glucosamine-Vit D (OSTEO BI-FLEX ONE PER DAY PO) Take 2,000 Units by mouth 2 (two) times daily.  . calcium-vitamin D (OSCAL WITH D) 500-200 MG-UNIT tablet Take 1 tablet by mouth 2 (two) times daily.  . clopidogrel (PLAVIX) 75 MG tablet TAKE 1 TABLET BY MOUTH EVERY DAY  . denosumab (PROLIA) 60 MG/ML SOLN injection Inject 60 mg into the skin every 6 (six) months. Administer in upper arm, thigh, or abdomen  . fexofenadine (ALLEGRA) 180 MG tablet Take 180 mg by mouth daily as needed for allergies or rhinitis.  Marland Kitchen gabapentin (NEURONTIN) 100 MG capsule 100 mg. Takes as needed; patient has not taken since she started the seroquel. Takes prn if pain  . isosorbide dinitrate (ISORDIL) 10 MG tablet Take 1 tablet by mouth as needed for chest pain (Patient taking differently: Take 1 tablet by mouth as needed for chest pain take only if blood pressure is 140/88)  . metoprolol succinate (TOPROL-XL) 25 MG 24 hr tablet Take 0.5 tablets (12.5 mg total) by mouth daily with supper.  . nitroGLYCERIN (NITROSTAT) 0.4 MG SL tablet Place 1 tablet (0.4 mg total) under the tongue every 5 (five) minutes  as needed for chest pain (x 3 pills).  . Omega-3 Fatty Acids (FISH OIL) 1000 MG CAPS Take by mouth.  . pantoprazole (PROTONIX) 40 MG tablet Take 1 tablet (40 mg total) by mouth daily.  . Polysaccharide Iron Complex (NU-IRON PO) Take by mouth.  . Probiotic Product (ALIGN) 4 MG CAPS Take 4 mg by mouth daily.   . QUEtiapine (SEROQUEL) 25 MG tablet Take 25mg  in the morning and 50 at QHS (Patient taking differently: Taking as much as 175mg  in a 24 hr period.)  . rosuvastatin (CRESTOR) 20 MG tablet Take 20 mg by mouth at bedtime.  . TURMERIC PO Take 1 tablet by mouth daily.  . vitamin C (ASCORBIC ACID) 250 MG tablet Take 250 mg by mouth daily.   No facility-administered encounter medications on file as of 04/02/2019.     PHYSICAL EXAM:Limited d/t nature of telehealth visit  Well nourished elderly female who is pleasantly conversant though flat affect and minimal facial expressions. She appears fatigued.  Daughter Molly [name] is in attendance. Pulmonary:no signs of respiratory distress Extremities: no edema, no joint deformities Skin: no rashesexposed skin Neurological: Generalized weakness; low frequency tremor right wrist and hand  Julianne Handler, NP

## 2019-04-03 ENCOUNTER — Other Ambulatory Visit: Payer: Self-pay

## 2019-04-03 ENCOUNTER — Ambulatory Visit (INDEPENDENT_AMBULATORY_CARE_PROVIDER_SITE_OTHER): Payer: Medicare Other | Admitting: Neurology

## 2019-04-03 DIAGNOSIS — R441 Visual hallucinations: Secondary | ICD-10-CM | POA: Diagnosis not present

## 2019-04-03 MED ORDER — DONEPEZIL HCL 5 MG PO TABS: 10.0000 mg | ORAL_TABLET | Freq: Every day | ORAL | 0 refills | Status: DC

## 2019-04-03 NOTE — Progress Notes (Signed)
Virtual Visit via Video Note  I connected with Molly [name] on 04/03/19 at 11:00 AM EDT by a video enabled telemedicine application and verified that I am speaking with the correct person using two identifiers.  The patient was at home and her daughter Molly [name] was present throughout this visit.  I was in my office.   I discussed the limitations of evaluation and management by telemedicine and the availability of in person appointments. The patient expressed understanding and agreed to proceed.  History of Present Illness: [name] is seen today for virtual video follow-up visit following last office visit with me on 01/23/2019.  Her daughter Molly [name] was present throughout and facilitated this video visit.  She continues to have formed fixed visual hallucinations which are quite disturbing.  She was started on Seroquel by me and the dose has been gradually increased.  She is sleeping reasonably well at night though she occasionally gets up once with hallucinations.  She takes 50 mg at night.  During the daytime her dose is also been gradually increase.  She was not getting significant relief of her hallucinations with 25 mg dose and hence now she takes 50 mg around 3 times during the day.  She at times is quite sleepy and has not been ambulating as well due to tiredness.  The patient however remains quite aware of her surroundings and daughter feels that she is cognitively still quite intact.  She did have MRI scan of the brain done on 01/21/19 which showed no acute abnormality and age-appropriate changes of atrophy of the brain.  Lab work on 01/23/2019 was all unremarkable and urinalysis showed no infection.  EEG done on 02/28/2019 was normal.  Patient was seen by palliative care nurse and no significant medication changes were made.  She was referred by me to [facility] and has a appointment on 04/24/2019 for initial consult.    Observations/Objective: Neurological exam was limited due to constraints of  video visit.  Patient appeared drowsy but could be easily aroused.  She was well aware of her surroundings and what was happening in the void.  She had slightly diminished attention and recall was 2/3.  She was able to name 7 animals with 4 legs.  There is no aphasia apraxia or dysarthria.  Facial movements were symmetric.  She followed commands well.  Motor system exam was not done.  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  3 months history of formed fixed visual hallucinations which are quite disabling.  She is shown some response to Seroquel but appears sedated during the day.  She may have mild cognitive impairment and prominent visual hallucinations which may suggest beginning of neurodegenerative condition like Lewy body dementia   Follow Up Instructions: Recommend use Seroquel sparingly during the day and try 25 mg instead of 50 mg.  Continue Seroquel 50 mg at night.  Trial of Aricept 5 mg daily for a month to be increased if tolerated without side effects to 10 mg daily.  I have discussed possible side effects with the patient and daughter advised him to call me if needed.  She was advised to use her walker and be careful with her walking and avoid falls and injuries.  She was advised to keep her upcoming visit with geropsychiatry on 04/24/2019 and see if she could get an alternative medication to Seroquel. she will return for follow-up in the future in 2  months.   I discussed the assessment and treatment plan with the patient. The patient was provided an opportunity to ask questions and all were answered. The patient agreed with the plan and demonstrated an understanding of the instructions.   The patient was advised to call back or seek an in-person evaluation if the symptoms worsen or if the condition fails to improve as anticipated.  I provided 25 minutes of non-face-to-face time  during this encounter.   Antony Contras, MD

## 2019-04-04 ENCOUNTER — Other Ambulatory Visit: Payer: Medicare Other | Admitting: Licensed Clinical Social Worker

## 2019-04-04 ENCOUNTER — Other Ambulatory Visit: Payer: Self-pay

## 2019-04-04 DIAGNOSIS — Z515 Encounter for palliative care: Secondary | ICD-10-CM

## 2019-04-04 NOTE — Progress Notes (Signed)
COMMUNITY PALLIATIVE CARE SW NOTE  PATIENT NAME: Molly [name] DOB: [birth year] MRN: [id number]  PRIMARY CARE PROVIDER: [name], MD  RESPONSIBLE PARTY:  Acct ID - Guarantor Home Phone Work Phone Relationship Acct Type  [id number] Rosine [phone] Self P/F     [address] [place], [place] [address]   Due to the COVID-19 crisis, this virtual check-in visit was done via telephone from my office and it was initiated and consent given by this patient and or family.  PLAN OF CARE and INTERVENTIONS:             1. GOALS OF CARE/ ADVANCE CARE PLANNING:  Daughter's goal for her mother is to be happy and safe.  Patient is a DNR. 2. SOCIAL/EMOTIONAL/SPIRITUAL ASSESSMENT/ INTERVENTIONS:  SW conducted a Sales executive visit with patient's daughter, [name], per the request of NP, Violeta Gelinas.  Patient lives with her daughter who is her primary caregiver.  Patient also has three other daughters and a son who all live in the area.  SW provided active listening and supportive counseling while [name] discussed her current struggles.  She has had back surgery with pain issues.  Her faith is very strong and she relies on this often.  The plan is for SW to follow up wither her mother tomorrow to offer support. 3. PATIENT/CAREGIVER EDUCATION/ COPING:  SW provided education to patient's daughter regarding the MOST form. 4. PERSONAL EMERGENCY PLAN:  Contacts EMS. 5. COMMUNITY RESOURCES COORDINATION/ HEALTH CARE NAVIGATION:  Patient has 24/7 caregivers. 6. FINANCIAL/LEGAL CONCERNS/INTERVENTIONS:  None identified by daughter.     SOCIAL HX:  Social History   Tobacco Use  . Smoking status: Never Smoker  . Smokeless tobacco: Never Used  Substance Use Topics  . Alcohol use: No    Alcohol/week: 0.0 standard drinks    CODE STATUS:  DRN  ADVANCED DIRECTIVES: N MOST FORM COMPLETE:  N HOSPICE EDUCATION PROVIDED:  N PPS:  Appetite is normal per family.   Duration of visit and  documentation:  75 minutes.      Creola Corn Jceon Alverio, LCSW

## 2019-04-05 ENCOUNTER — Other Ambulatory Visit: Payer: Medicare Other | Admitting: Licensed Clinical Social Worker

## 2019-04-05 ENCOUNTER — Other Ambulatory Visit: Payer: Self-pay

## 2019-04-05 ENCOUNTER — Other Ambulatory Visit: Payer: Self-pay | Admitting: Neurology

## 2019-04-05 DIAGNOSIS — Z515 Encounter for palliative care: Secondary | ICD-10-CM

## 2019-04-05 NOTE — Progress Notes (Signed)
COMMUNITY PALLIATIVE CARE SW NOTE  PATIENT NAME: Molly [name] DOB: [birth year] MRN: [id number]  PRIMARY CARE PROVIDER: [name], MD  RESPONSIBLE PARTY:  Acct ID - Guarantor Home Phone Work Phone Relationship Acct Type  [id number] Rosine [phone] Self P/F     [address] [place], [place] [address]   Due to the COVID-19 crisis, this virtual check-in visit was done via telephone from my office and it was initiated and consent given by thispatient and or family.  PLAN OF CARE and INTERVENTIONS:             1. GOALS OF CARE/ ADVANCE CARE PLANNING:  For patient to remain happy and safe.  Patient has a DNR. 2. SOCIAL/EMOTIONAL/SPIRITUAL ASSESSMENT/ INTERVENTIONS:  SW conducted a scheduled virtual check-in visit with patient and her daughter, Molly [name].  Patient reports not sleeping well due to her hallucinations.  She is seeing bugs and feels them in her ears and on her body.  Patient's doctors are addressing this issue and have changed her medications.  SW provided active listening and supportive counseling and patient discussed the recent death of her second husband.  She was able to engage in conversation about how they met and his death.  Daughter stated patient had become tearful during the discussion.  Molly [name] was encouraged to follow-up with SW should patient wish to discuss the MOST form or any other issues. 3. PATIENT/CAREGIVER EDUCATION/ COPING:  SW provided education regarding Palliative Care role.  Patient and daughter express their feelings openly. 4. PERSONAL EMERGENCY PLAN:  Contact EMS 5. COMMUNITY RESOURCES COORDINATION/ HEALTH CARE NAVIGATION:  24/7 hired caregivers. 6. FINANCIAL/LEGAL CONCERNS/INTERVENTIONS:  None.     SOCIAL HX:  Social History   Tobacco Use  . Smoking status: Never Smoker  . Smokeless tobacco: Never Used  Substance Use Topics  . Alcohol use: No    Alcohol/week: 0.0 standard drinks    CODE STATUS:  DNR  ADVANCED DIRECTIVES:  N MOST FORM COMPLETE:  N HOSPICE EDUCATION PROVIDED:  Patient's deceased husband had hospice in their home. PPS:  Appetite is normal. Duration of visit and documentation:  45 minutes.      Creola Corn Laith Antonelli, LCSW

## 2019-04-09 ENCOUNTER — Other Ambulatory Visit: Payer: Medicare Other | Admitting: Internal Medicine

## 2019-04-09 ENCOUNTER — Encounter: Payer: Self-pay | Admitting: Internal Medicine

## 2019-04-09 ENCOUNTER — Other Ambulatory Visit: Payer: Self-pay

## 2019-04-09 DIAGNOSIS — Z515 Encounter for palliative care: Secondary | ICD-10-CM

## 2019-04-09 NOTE — Progress Notes (Signed)
April27th, [facility] Consult Note Telephone: [phone]  Fax: [phone]  Due to the current COVID-19 infection/crises, the patient and family prefer, and have given their verbal consent for, a provider visit via telemedicine, from my office. HIPPA policies of confidentially were discussed.  PATIENT NAME:Molly [name] DOB:06-May-1929 QQV:[id number]  PRIMARY CARE PROVIDER:Griffin, Molly [name], [address] [place], [place] [address]  [facility] (Neurology).  Referral pending Dr. Norma [name] (apt May 20)  RESPONSIBLE PARTY:*(dtr) Molly [name] [phone] e-mail: [email or web address]. (dtr) Ms Molly [name], e-mail: [email or web address]   1.Visual, auditory, and tactile hallucinations: Molly [name] adjusted Seroquel to 25 mg q am, and 50 mg qhs, with 25 mg prn break through symptoms (has averaged 2-3 doses of prn med/d). Aricept 10 mg also initiated. Over the last week, improvement of symptoms such that she has gone from having hallucinations total of 14 hrs/day last week, to 3-4 hours/d. Daughter Molly [name] reports patient moves about a bit more slowly, but less fatigued since Seroquel dosage decreased. Needs assist with transfers. Molly [name] office has arranged for psych referral to Dr. Casimiro [name], and appointment is scheduled for May 20th. Daughter reports when patient's nose drips from allergies, patient interprets this tactile sensation as spiders in her nose. Patient does use antihistamine on a daily basis. We discussed starting Flonase 1 inhaled squirt each nare bid. PC LCSW has been follow over the last few weeks and will continue to do so; family report finding her visits very helpful.  2.Slow functional decline:Continueslucidand oriented. Continues to be dependent in transfers, hygiene, and stand by assist with rollator walker. RUE tremor is stable. Can feed herself. Family report  her stamina is less and her movement slower. Needs increased help and not walking as much as was. Excerbation of R LE foot drop.  3. Advanced Care Directives:DNR (uploaded onto VYNCA in EPIC). Family have received DNR forms, and copy of MOST form. They are interested in discussing / completing this form in greater detail upon next visit.  4. F/U: Telehealth visit scheduled for Mon 04/16/2019 at 2pm.   I spent74minutes providing this consultation, from2pmto2:30pm. More than 50% of the time in this consultation was spent coordinating communication.   HISTORY OF PRESENT ILLNESS:Molly Foglemanis [phone] y.o.femalewith medical h/o CAD, HLD, HTN, and osteoporosis.This is a f/uPalliative Carevisit from 4/20/2020to helpwith symptom management andongoing discussions FU:XNATF of care.   CODE STATUS:DNR  PPS:30% HOSPICE ELIGIBILITY/DIAGNOSIS:TBD  PAST MEDICAL HISTORY:  Past Medical History:  Diagnosis Date  . Acid reflux   . Anemia   . Arthritis   . CAD (coronary artery disease)   . High cholesterol   . Hyperlipidemia   . Hypertension   . Osteoporosis   . Spinal stenosis     SOCIAL HX:  Social History   Tobacco Use  . Smoking status: Never Smoker  . Smokeless tobacco: Never Used  Substance Use Topics  . Alcohol use: No    Alcohol/week: 0.0 standard drinks    ALLERGIES:  Allergies  Allergen Reactions  . Buprenorphine Hcl Shortness Of Breath    Chest pain  . Codeine Nausea And Vomiting    ulcers  . Morphine And Related Shortness Of Breath    Chest pain, made me crazy  . Sulfacetamide Sodium Nausea And Vomiting  . Vicodin [Hydrocodone-Acetaminophen] Nausea And Vomiting  . Evista [Raloxifene] Other (See Comments) and Nausea And Vomiting    Upset stomach  . Hydromorphone Nausea And Vomiting    Sensitive  .  Other Other (See Comments)    Narcotic-like medications - does NOT tolerate pain meds, causes unrelating volatile vomiting and drops in BP below  80/40 with dizziness and fainting  Tourniquet - Please place tourniquet over cloth - causes sever bruising.  Blood pressure cuff - please use manual blood pressure cuff - causes excoriating pain using machine and bruising     . Sulfur Nausea And Vomiting    Vomited until bleeding occurred and developed an active stomach ulcer  . Tape Other (See Comments)    Rips skin     PERTINENT MEDICATIONS:  Outpatient Encounter Medications as of 04/09/2019  Medication Sig  . acetaminophen (TYLENOL) 500 MG tablet Take 500 mg by mouth every 8 (eight) hours as needed for mild pain.   . bisacodyl (DULCOLAX) 10 MG suppository Place 10 mg rectally as needed for moderate constipation.  . Boswellia-Glucosamine-Vit D (OSTEO BI-FLEX ONE PER DAY PO) Take 2,000 Units by mouth 2 (two) times daily.  . calcium-vitamin D (OSCAL WITH D) 500-200 MG-UNIT tablet Take 1 tablet by mouth 2 (two) times daily.  . clopidogrel (PLAVIX) 75 MG tablet TAKE 1 TABLET BY MOUTH EVERY DAY  . denosumab (PROLIA) 60 MG/ML SOLN injection Inject 60 mg into the skin every 6 (six) months. Administer in upper arm, thigh, or abdomen  . donepezil (ARICEPT) 5 MG tablet Take 2 tablets (10 mg total) by mouth at bedtime. Start 1 tablet daily with food x 4 weeks and then 2 tablets daily  . fexofenadine (ALLEGRA) 180 MG tablet Take 180 mg by mouth daily as needed for allergies or rhinitis.  Marland Kitchen gabapentin (NEURONTIN) 100 MG capsule 100 mg. Takes as needed; patient has not taken since she started the seroquel. Takes prn if pain  . isosorbide dinitrate (ISORDIL) 10 MG tablet Take 1 tablet by mouth as needed for chest pain (Patient taking differently: Take 1 tablet by mouth as needed for chest pain take only if blood pressure is 140/88)  . metoprolol succinate (TOPROL-XL) 25 MG 24 hr tablet Take 0.5 tablets (12.5 mg total) by mouth daily with supper.  . nitroGLYCERIN (NITROSTAT) 0.4 MG SL tablet Place 1 tablet (0.4 mg total) under the tongue every 5 (five)  minutes as needed for chest pain (x 3 pills).  . Omega-3 Fatty Acids (FISH OIL) 1000 MG CAPS Take by mouth.  . pantoprazole (PROTONIX) 40 MG tablet Take 1 tablet (40 mg total) by mouth daily.  . Polysaccharide Iron Complex (NU-IRON PO) Take by mouth.  . Probiotic Product (ALIGN) 4 MG CAPS Take 4 mg by mouth daily.   . QUEtiapine (SEROQUEL) 25 MG tablet TAKE 1 TABLET BY MOUTH IN THE MORNING AND 2 TABLETS BY MOUTH AT BEDTIME per Molly [name] on 04/03/2019 office notes  . rosuvastatin (CRESTOR) 20 MG tablet Take 20 mg by mouth at bedtime.  . TURMERIC PO Take 1 tablet by mouth daily.  . vitamin C (ASCORBIC ACID) 250 MG tablet Take 250 mg by mouth daily.   No facility-administered encounter medications on file as of 04/09/2019.     PHYSICAL EXAM: Limited d/tnature oftelehealth visit  Well nourished elderly female who is pleasantly conversant though flat affect and minimal facial expressions. She appears fatigued.  Daughter Ander Purpura is in attendance. Pulmonary:no signs of respiratory distress Extremities: no edema, no joint deformities Skin: no rashesexposed skin Neurological: Generalized weakness; low frequency tremor right wrist and hand  Julianne Handler, NP

## 2019-04-13 NOTE — Telephone Encounter (Signed)
pts daughter called in and stated pt is unable to sleep at night she is having bad nightmares , she wants to know if the aricept can be taken during the day and seroquel at night

## 2019-04-16 ENCOUNTER — Other Ambulatory Visit: Payer: Self-pay

## 2019-04-16 ENCOUNTER — Telehealth: Payer: Self-pay

## 2019-04-16 ENCOUNTER — Encounter: Payer: Self-pay | Admitting: Internal Medicine

## 2019-04-16 ENCOUNTER — Other Ambulatory Visit: Payer: Medicare Other | Admitting: Internal Medicine

## 2019-04-16 DIAGNOSIS — Z515 Encounter for palliative care: Secondary | ICD-10-CM

## 2019-04-16 NOTE — Telephone Encounter (Signed)
I called patients daughter that the aricept can be change to daytime because pt is having nightmares. I stated to continue the dosage of seroquel at night what was discuss in Dr. Leonie Man last video visit. [name] verbalized understanding.

## 2019-04-16 NOTE — Telephone Encounter (Signed)
Molly [name] R at 04/13/2019 9:42 AM   Status: Signed    pts daughter called in and stated pt is unable to sleep at night she is having bad nightmares , she wants to know if the aricept can be taken during the day and seroquel at night

## 2019-04-16 NOTE — Progress Notes (Signed)
May 4th, 2020 [place] Consult Note Telephone: [phone]  Fax: [phone]  Due to the current COVID-19 infection/crises, the patient and family prefer, and have given their verbal consent for, a provider visit via telemedicine, from my office. HIPPA policies of confidentially were discussed.  PATIENT NAME:Molly [name] DOB:09-24-1929 ZYS:[id number]  PRIMARY CARE PROVIDER:Griffin, Margaretha [place], [address] [place], [place] [address]  [facility] (Neurology).  Referral pending Dr. Norma Fredrickson (apt May 20)  RESPONSIBLE PARTY:*(dtr) Allene Dillon [phone] e-mail: [email or web address]. (dtr) Ms Ishmael Holter, e-mail: [email or web address]   1.Visual, auditory, and tactile hallucinations:Dr. Leonie Man adjusted Seroquel to 25 mg q am, and 50 mg qhs, with 25 mg prn break through symptoms (has averaged 2-3 doses of prn med/d). Aricept 5 mg also initiated with dose adjustment to 10mg  in 2 weeks. Unfortunately, though symptoms improved for 2 days about 1 weeks ago, now recurrence to lasting about 14 hours a day, with increased nocturnal nightmares (dreams of being "trapped or buried").  Scheduled to see Dr. Casimiro Needle [facility] psych), May 20th.  2.Slow functional decline:Continueslucidand oriented. Continues to be dependent in transfers, hygiene, and stand by assist with rollator walker. RUE tremor is stable. Can feed herself. Family report her stamina is less and her movement slower.Needs increased help and not walking as much as was. R LE foot drop.  3. Advanced Care Directives:DNR.I had mailed the MOST form to the family. Patient completed and signed. I discussed and verified specifics with both patient and dtr [name]. I am working on uploading MOST form and copy of patient's HCPOA, into VYNCA system, then I will mail those documents back to family.  4. F/U: Telehealth visit scheduled for  Mon 04/16/2019 at 2pm.   I spent69minutes providing this consultation, from2pmto2:30pm. More than 50% of the time in this consultation was spent coordinating communication.   HISTORY OF PRESENT ILLNESS:Aqua Foglemanis [phone] y.o.femalewith medical h/o CAD, HLD, HTN, and osteoporosis.This is a f/uPalliative Carevisit from 4/27/2020to helpwith symptom management andongoing discussions BJ:SEGBT of care.   CODE STATUS:DNR. MOST: DNR/DNI (completed 04/13/2019). Scope of Medical Interventions: Comfort measures. Antibiotics: Determine [place] or limitations at time of infection. No to IVFs, no to tube feedings.   PPS:30% HOSPICE ELIGIBILITY/DIAGNOSIS:TBD  PAST MEDICAL HISTORY:  Past Medical History:  Diagnosis Date  . Acid reflux   . Anemia   . Arthritis   . CAD (coronary artery disease)   . High cholesterol   . Hyperlipidemia   . Hypertension   . Osteoporosis   . Spinal stenosis     SOCIAL HX:  Social History   Tobacco Use  . Smoking status: Never Smoker  . Smokeless tobacco: Never Used  Substance Use Topics  . Alcohol use: No    Alcohol/week: 0.0 standard drinks    ALLERGIES:  Allergies  Allergen Reactions  . Buprenorphine Hcl Shortness Of Breath    Chest pain  . Codeine Nausea And Vomiting    ulcers  . Morphine And Related Shortness Of Breath    Chest pain, made me crazy  . Sulfacetamide Sodium Nausea And Vomiting  . Vicodin [Hydrocodone-Acetaminophen] Nausea And Vomiting  . Evista [Raloxifene] Other (See Comments) and Nausea And Vomiting    Upset stomach  . Hydromorphone Nausea And Vomiting    Sensitive  . Other Other (See Comments)    Narcotic-like medications - does NOT tolerate pain meds, causes unrelating volatile vomiting and drops in BP below 80/40 with dizziness and fainting  Tourniquet -  Please place tourniquet over cloth - causes sever bruising.  Blood pressure cuff - please use manual blood pressure cuff - causes excoriating pain using  machine and bruising     . Sulfur Nausea And Vomiting    Vomited until bleeding occurred and developed an active stomach ulcer  . Tape Other (See Comments)    Rips skin     PERTINENT MEDICATIONS:  Outpatient Encounter Medications as of 04/16/2019  Medication Sig  . acetaminophen (TYLENOL) 500 MG tablet Take 500 mg by mouth every 8 (eight) hours as needed for mild pain.   . bisacodyl (DULCOLAX) 10 MG suppository Place 10 mg rectally as needed for moderate constipation.  . Boswellia-Glucosamine-Vit D (OSTEO BI-FLEX ONE PER DAY PO) Take 2,000 Units by mouth 2 (two) times daily.  . calcium-vitamin D (OSCAL WITH D) 500-200 MG-UNIT tablet Take 1 tablet by mouth 2 (two) times daily.  . clopidogrel (PLAVIX) 75 MG tablet TAKE 1 TABLET BY MOUTH EVERY DAY  . denosumab (PROLIA) 60 MG/ML SOLN injection Inject 60 mg into the skin every 6 (six) months. Administer in upper arm, thigh, or abdomen  . donepezil (ARICEPT) 5 MG tablet Take 2 tablets (10 mg total) by mouth at bedtime. Start 1 tablet daily with food x 4 weeks and then 2 tablets daily  . fexofenadine (ALLEGRA) 180 MG tablet Take 180 mg by mouth daily as needed for allergies or rhinitis.  Marland Kitchen gabapentin (NEURONTIN) 100 MG capsule 100 mg. Takes as needed; patient has not taken since she started the seroquel. Takes prn if pain  . isosorbide dinitrate (ISORDIL) 10 MG tablet Take 1 tablet by mouth as needed for chest pain (Patient taking differently: Take 1 tablet by mouth as needed for chest pain take only if blood pressure is 140/88)  . metoprolol succinate (TOPROL-XL) 25 MG 24 hr tablet Take 0.5 tablets (12.5 mg total) by mouth daily with supper.  . nitroGLYCERIN (NITROSTAT) 0.4 MG SL tablet Place 1 tablet (0.4 mg total) under the tongue every 5 (five) minutes as needed for chest pain (x 3 pills).  . Omega-3 Fatty Acids (FISH OIL) 1000 MG CAPS Take by mouth.  . pantoprazole (PROTONIX) 40 MG tablet Take 1 tablet (40 mg total) by mouth daily.  .  Polysaccharide Iron Complex (NU-IRON PO) Take by mouth.  . Probiotic Product (ALIGN) 4 MG CAPS Take 4 mg by mouth daily.   . QUEtiapine (SEROQUEL) 25 MG tablet TAKE 1 TABLET BY MOUTH IN THE MORNING AND 2 TABLETS BY MOUTH AT BEDTIME per Dr. Leonie Man on 04/03/2019 office notes  . rosuvastatin (CRESTOR) 20 MG tablet Take 20 mg by mouth at bedtime.  . TURMERIC PO Take 1 tablet by mouth daily.  . vitamin C (ASCORBIC ACID) 250 MG tablet Take 250 mg by mouth daily.   No facility-administered encounter medications on file as of 04/16/2019.     PHYSICAL EXAM: Limited d/tnature oftelehealth visit  Well nourished elderly female who is pleasantly conversant though flat affect and minimal facial expressions. She appears fatigued. Daughter Lauren isin attendance. Pulmonary:no signs of respiratory distress Extremities: no edema, no joint deformities Skin: no rashesexposed skin Neurological: Generalized weakness; low frequency tremor right wrist and hand  Julianne Handler, NP

## 2019-04-24 ENCOUNTER — Other Ambulatory Visit: Payer: Self-pay

## 2019-04-24 ENCOUNTER — Telehealth: Payer: Self-pay | Admitting: Internal Medicine

## 2019-04-24 ENCOUNTER — Encounter (HOSPITAL_COMMUNITY): Payer: Self-pay | Admitting: Psychiatry

## 2019-04-24 ENCOUNTER — Ambulatory Visit (INDEPENDENT_AMBULATORY_CARE_PROVIDER_SITE_OTHER): Payer: Medicare Other | Admitting: Psychiatry

## 2019-04-24 VITALS — Ht 63.0 in | Wt 151.0 lb

## 2019-04-24 DIAGNOSIS — F0151 Vascular dementia with behavioral disturbance: Secondary | ICD-10-CM | POA: Diagnosis not present

## 2019-04-24 DIAGNOSIS — F01518 Vascular dementia, unspecified severity, with other behavioral disturbance: Secondary | ICD-10-CM

## 2019-04-24 MED ORDER — PIMAVANSERIN TARTRATE 34 MG PO CAPS: 34.0000 mg | ORAL_CAPSULE | Freq: Every morning | ORAL | 4 refills | Status: DC

## 2019-04-24 NOTE — Telephone Encounter (Signed)
04/24/2019:  Received completed MOST form (we had discussed and completed this on previous tele-health visits) and copy of [place]. I uploaded both these forms into the [facility] system; awaiting indexing. I will mail these forms back to daughter and HCPOA L[place]on Ka, tomorrow am.  Violeta Gelinas NP-C [phone]

## 2019-04-24 NOTE — Progress Notes (Signed)
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  The patient is a victim of a right CVA.  In January of this year she started to experience visual hallucinations.  She has no auditory hallucinations or paranoia.  She feels and sees small hard shell bugs all around her and in her hand.  She can actually feel the hard shell but she says they disappear and evaporate quickly.  She does see them persistently and she sees them floating in the environment all the time.  She says they get into her mouth and into her throat.  She is frightened of them.  She is very uncomfortable with them.  She is very curious that nobody else can see them or feel them.  She clearly seems to stress from them.  The patient lives with her daughter Molly [name].  The patient has 5 children over a dozen grandchildren and a half a dozen great-grandchildren all of whom are doing well.  The patient's biggest complaint herself is the inability to walk and being bothered by these small little bees for bugs.  She thinks they are associated with her nighttime terrors.  She wakes up frightened and scared.  She sees these small little bugs all during the day.  The patient denies daily depression.  She generally actually sleeps fairly well.  She does not take naps.  Her energy level is good and she is eating well.  She says she has no problems thinking or concentrating.  She is not suicidal now and never has been.  The patient denies use of alcohol now and never has drink.  She uses no illicit drugs.  She denies a distinct episode of major depression and her daughters agree.  She is never been manic.  She denies generalized anxiety  disorder panic disorder or obsessive-compulsive disorder.  She apparently has been on Seroquel 25 mg in the morning and 50 mg at night.  It is not really helped her visions. The patient denies any past psychiatric history.  She never been in a psych hospital and never seen a psychiatrist before.  The patient actually could do all her basic ADLs if not for stroke.  In essence she can dress herself with assistance she goes to the bathroom with assistance and is not incontinent.  She eats her food without a problem.  Her weight is stable.  The patient enjoys bowling through her computer.  She watches the news regularly.  She watches a lot again shows.  According to her daughter she is very alert lucid and her memory actually seems to be quite intact.  Today was not formally tested.  Associated Signs/Symptoms: Depression Symptoms:   (Hypo) Manic Symptoms:   Anxiety Symptoms:   Psychotic Symptoms:  Hallucinations: Visual PTSD Symptoms:   Past Psychiatric History: Seroquel 100 mg  Previous Psychotropic Medications:   Substance Abuse History in the last 12 months:    Consequences of Substance Abuse:   Past Medical History:  Past Medical History:  Diagnosis Date  . Acid reflux   . Anemia   . Arthritis   . CAD (coronary artery disease)   . High cholesterol   . Hyperlipidemia   . Hypertension   .  Osteoporosis   . Spinal stenosis   . Stroke (cerebrum) ([place]) 12/07/2017    Past Surgical History:  Procedure Laterality Date  . ABDOMINAL HYSTERECTOMY    . APPENDECTOMY    . BACK SURGERY     Rod and Pins Placed   . CARDIAC CATHETERIZATION N/A 11/17/2015   Procedure: Left Heart Cath and Coronary Angiography;  Surgeon: Jettie Booze, MD;  Location: [facility];  Service: Cardiovascular;  Laterality: N/A;  . CAROTID STENT    . UPPER GI ENDOSCOPY      Family Psychiatric History:   Family History:  Family History  Problem Relation Age of Onset  . Anemia Mother   . Heart  attack Brother     Social History:   Social History   Socioeconomic History  . Marital status: Married    Spouse name: Not on file  . Number of children: Not on file  . Years of education: Not on file  . Highest education level: Not on file  Occupational History  . Not on file  Social Needs  . Financial resource strain: Not on file  . Food insecurity:    Worry: Not on file    Inability: Not on file  . Transportation needs:    Medical: Not on file    Non-medical: Not on file  Tobacco Use  . Smoking status: Never Smoker  . Smokeless tobacco: Never Used  Substance and Sexual Activity  . Alcohol use: No    Alcohol/week: 0.0 standard drinks  . Drug use: No  . Sexual activity: Not Currently  Lifestyle  . Physical activity:    Days per week: Not on file    Minutes per session: Not on file  . Stress: Not on file  Relationships  . Social connections:    Talks on phone: Not on file    Gets together: Not on file    Attends religious service: Not on file    Active member of club or organization: Not on file    Attends meetings of clubs or organizations: Not on file    Relationship status: Not on file  Other Topics Concern  . Not on file  Social History Narrative  . Not on file    Additional Social History:   Allergies:   Allergies  Allergen Reactions  . Buprenorphine Hcl Shortness Of Breath    Chest pain  . Codeine Nausea And Vomiting    ulcers  . Morphine And Related Shortness Of Breath    Chest pain, made me crazy  . Sulfacetamide Sodium Nausea And Vomiting  . Vicodin [Hydrocodone-Acetaminophen] Nausea And Vomiting  . Evista [Raloxifene] Other (See Comments) and Nausea And Vomiting    Upset stomach  . Hydromorphone Nausea And Vomiting    Sensitive  . Other Other (See Comments)    Narcotic-like medications - does NOT tolerate pain meds, causes unrelating volatile vomiting and drops in BP below 80/40 with dizziness and fainting  Tourniquet - Please place  tourniquet over cloth - causes sever bruising.  Blood pressure cuff - please use manual blood pressure cuff - causes excoriating pain using machine and bruising     . Sulfur Nausea And Vomiting    Vomited until bleeding occurred and developed an active stomach ulcer  . Tape Other (See Comments)    Rips skin    Metabolic Disorder Labs: Lab Results  Component Value Date   HGBA1C 5.7 (H) 12/08/2017   MPG 116.89 12/08/2017   No results  found for: PROLACTIN Lab Results  Component Value Date   CHOL 133 12/08/2017   TRIG 98 12/08/2017   HDL 63 12/08/2017   CHOLHDL 2.1 12/08/2017   VLDL 20 12/08/2017   LDLCALC 50 12/08/2017   LDLCALC 87 11/18/2014   Lab Results  Component Value Date   TSH 3.010 01/23/2019    Therapeutic Level Labs: No results found for: LITHIUM No results found for: CBMZ No results found for: VALPROATE  Current Medications: Current Outpatient Medications  Medication Sig Dispense Refill  . acetaminophen (TYLENOL) 500 MG tablet Take 500 mg by mouth every 8 (eight) hours as needed for mild pain.     . bisacodyl (DULCOLAX) 10 MG suppository Place 10 mg rectally as needed for moderate constipation.    . [place]wellia-Glucosamine-Vit D (OSTEO BI-FLEX ONE PER DAY PO) Take 2,000 Units by mouth 2 (two) times daily.    . calcium-vitamin D (OSCAL WITH D) 500-200 MG-UNIT tablet Take 1 tablet by mouth 2 (two) times daily. 120 tablet 0  . clopidogrel (PLAVIX) 75 MG tablet TAKE 1 TABLET BY MOUTH EVERY DAY 90 tablet 0  . denosumab (PROLIA) 60 MG/ML SOLN injection Inject 60 mg into the skin every 6 (six) months. Administer in upper arm, thigh, or abdomen    . donepezil (ARICEPT) 5 MG tablet Take 2 tablets (10 mg total) by mouth at bedtime. Start 1 tablet daily with food x 4 weeks and then 2 tablets daily 30 tablet 0  . fexofenadine (ALLEGRA) 180 MG tablet Take 180 mg by mouth daily as needed for allergies or rhinitis.    Marland Kitchen gabapentin (NEURONTIN) 100 MG capsule 100 mg. Takes as  needed; patient has not taken since she started the seroquel. Takes prn if pain    . isosorbide dinitrate (ISORDIL) 10 MG tablet Take 1 tablet by mouth as needed for chest pain (Patient taking differently: Take 1 tablet by mouth as needed for chest pain take only if blood pressure is 140/88) 90 tablet 3  . metoprolol succinate (TOPROL-XL) 25 MG 24 hr tablet Take 0.5 tablets (12.5 mg total) by mouth daily with supper. 30 tablet 0  . nitroGLYCERIN (NITROSTAT) 0.4 MG SL tablet Place 1 tablet (0.4 mg total) under the tongue every 5 (five) minutes as needed for chest pain (x 3 pills). 25 tablet 3  . Omega-3 Fatty Acids (FISH OIL) 1000 MG CAPS Take by mouth.    . pantoprazole (PROTONIX) 40 MG tablet Take 1 tablet (40 mg total) by mouth daily. 30 tablet 0  . polyethylene glycol powder (MIRALAX) 17 GM/SCOOP powder Take 1 Container by mouth once.    . Polysaccharide Iron Complex (NU-IRON PO) Take by mouth.    . Probiotic Product (ALIGN) 4 MG CAPS Take 4 mg by mouth daily.     . QUEtiapine (SEROQUEL) 25 MG tablet TAKE 1 TABLET BY MOUTH IN THE MORNING AND 2 TABLETS BY MOUTH AT BEDTIME per Dr. Leonie Man on 04/03/2019 office notes 90 tablet 3  . rosuvastatin (CRESTOR) 20 MG tablet Take 20 mg by mouth at bedtime.    . TURMERIC PO Take 1 tablet by mouth daily.    . vitamin C (ASCORBIC ACID) 250 MG tablet Take 250 mg by mouth daily.     No current facility-administered medications for this visit.     Musculoskeletal: Strength & Muscle Tone: decreased Gait & Station: unable to stand Patient leans: N/A  Psychiatric Specialty Exam: ROS  Height 5\' 3"  (1.6 m), weight 151 lb (68.5 kg).Body mass  index is 26.75 kg/m.  General Appearance: Casual  Eye Contact:  Good  Speech:  Normal Rate  Volume:  Normal  Mood:  Euthymic  Affect:  Appropriate  Thought Process:  Goal Directed  Orientation:  NA  Thought Content:  Hallucinations: Visual  Suicidal Thoughts:  No  Homicidal Thoughts:  No  Memory:    Judgement:   Good  Insight:  Fair  Psychomotor Activity:  Decreased  Concentration:    Recall:  [place] of Knowledge:Fair  Language: Poor  Akathisia:  No  Handed:  Right  AIMS (if indicated):  not done  Assets:  Desire for Improvement  ADL's:  Impaired  Cognition: WNL  Sleep:  Fair   Screenings: PHQ2-9     [facility]  PHQ-2 Total Score  1  PHQ-9 Total Score  2      Assessment and Plan:   At this time I do suspect this patient is experiencing visual hallucinations.  I do not think it is related to clinical depression nor to a primary psychotic disorder.  I am not completely clear if it is actually related to a dementing process either.  I suspect it is related to her right CVA.  I think he does produce some degree of tremor and some type of physical limitations.  The Seroquel that she is taking at 100 mg has not been all that helpful.  It possibly might help her sleep but I would not use Seroquel for a sleeping aid.  The family says it does not help the visual experiences she is having which is frightening uncomfortable for her.  It is noted that she does not see dogs or animals.  She does have some nightmares which might possibly be related to Aricept.  At this time my interventions are to begin her on Nuplazid and discontinue her Seroquel.  We will make arrangements to get her this agent.  When it comes we will ask them to stop the Seroquel and will offer a different medicine used purely for sleep.  I suspect probably removing the Aricept should be considered.  I am not sure the Aricept is helpful at this time.  The daughter seem to agree with this idea.  We told her that the risk of problems with Nuplazid are very low.  This patient to return to see me in 2-1/2 months.   Jerral Ralph, MD 5/12/20202:57 PM

## 2019-04-25 ENCOUNTER — Other Ambulatory Visit: Payer: Self-pay | Admitting: Neurology

## 2019-04-25 NOTE — Telephone Encounter (Signed)
Refill done.  

## 2019-05-01 ENCOUNTER — Other Ambulatory Visit: Payer: Self-pay | Admitting: Neurology

## 2019-05-01 ENCOUNTER — Telehealth: Payer: Self-pay | Admitting: Internal Medicine

## 2019-05-01 NOTE — Telephone Encounter (Signed)
TC from patient's daughter [name], who called to update. Patient had been seen by Geriatric Psych Dr. Casimiro Needle, who prescribed Nuplazid in the hopes this medication would help in decreasing patient's auditory and visual hallucinations. However, cost has become a factor as 30 pills cost $3400. Daughter has contacted the drug manufacturer but has been unable to gain assistance.[name] plans to contact Dr. Casimiro Needle to check for possible alternatives. Patient continues on evening dose Seroquel 50mg , with Aricept qam (due to increase am dose). Takes 25 mg Seroquel tid as well.  Violeta Gelinas NP-C [phone]

## 2019-05-04 ENCOUNTER — Other Ambulatory Visit (HOSPITAL_COMMUNITY): Payer: Self-pay

## 2019-05-04 MED ORDER — ARIPIPRAZOLE 5 MG PO TABS: 5.0000 mg | ORAL_TABLET | Freq: Every day | ORAL | 2 refills | Status: DC

## 2019-05-15 ENCOUNTER — Telehealth (HOSPITAL_COMMUNITY): Payer: Self-pay

## 2019-05-15 ENCOUNTER — Telehealth (HOSPITAL_COMMUNITY): Payer: Self-pay | Admitting: Neurology

## 2019-05-15 MED ORDER — RISPERIDONE 1 MG PO TABS: 0.5000 mg | ORAL_TABLET | Freq: Two times a day (BID) | ORAL | 2 refills | Status: DC

## 2019-05-15 NOTE — Telephone Encounter (Signed)
I spoke to the patient's daughter [name] about her mother's hallucinations which seem refractory to Seroquel and now Aricept which she is taking 10 mg daily.  She was referred by me to a geriatric psychiatrist Dr. Casimiro Needle who prescribed Nuplazid but patient did not qualify for this medicine.  He also tried aripiprazole but that made the patient too sleepy and tired and she was not walking and hence it was discontinued.  She appears quite frustrated with lack of improvement.  I recommend she continue Aricept 10 mg daily and try low-dose Risperdal for her hallucinations which appear refractory and are quite anxiety provoking for the patient.  She was also advised to call Dr. Casimiro Needle and follow-up with him if he had any alternative suggestions

## 2019-05-15 NOTE — Telephone Encounter (Signed)
Patient's daughter called and requested a call back from doctor. Daughter stated that patient's medication is making her nautious and also stated that patient is seeing snakes coming out of her pillow. Daughter stated this has been going on for 3 weeks and that she has sent messages through my chart. Thank you.

## 2019-05-21 ENCOUNTER — Telehealth: Payer: Self-pay | Admitting: Neurology

## 2019-05-21 ENCOUNTER — Other Ambulatory Visit: Payer: Self-pay

## 2019-05-21 MED ORDER — DONEPEZIL HCL 10 MG PO TABS: 10.0000 mg | ORAL_TABLET | Freq: Every day | ORAL | 1 refills | Status: DC

## 2019-05-21 NOTE — Telephone Encounter (Signed)
Pt's daughter called stating that the insurance will not approve the donepezil (ARICEPT) 10 MG tablet the way it was written to the pharmacy. They will only approve it if its written for 10mg  instead of two 5mg . Please advise.

## 2019-05-21 NOTE — Telephone Encounter (Signed)
Medication order for aricept 10mg  was already change at 0819am today and sent to pharmacy.

## 2019-05-21 NOTE — Telephone Encounter (Signed)
Sent mychart message to pts daughter about aricpet being change to 10mg  per pharmacy.

## 2019-05-30 ENCOUNTER — Telehealth: Payer: Self-pay | Admitting: Interventional Cardiology

## 2019-05-30 NOTE — Telephone Encounter (Signed)
I agree.  Given her age, latest BP goal has become less strict and is < 150/90.  SInce she is dizzy with the low readings, we want to avoid low readings.

## 2019-05-30 NOTE — Telephone Encounter (Signed)
Called pt's daughter today to address her needs. She states she is frustrated over the fluctuations in her mother's BP's throughout the day. She states she does not give the isosorbide bc of bad reactions. She does give the 25mg  Toprol, but sometimes splits it up to 12.5mg  bid. When she does this, she notices her mother's BP becomes elevated. When she gives the 25mg  qd, her BP becomes too low and she is symptomatic with dizziness and fatigue.   I advised her to continue to give the 12.5mg  bid for now. Since she is symptomatic with hypotension, it is better for her to run on the higher side even if she is SBP > 130. I will forward to Dr Irish Lack and his RN to review and give further recommendation.   She verbalized understanding and had no additional questions.

## 2019-05-30 NOTE — Telephone Encounter (Signed)
New Message:   Daughter said she left a message on My Chart on 05-21-19 and 05-29-19. Please give her a call on this message please.

## 2019-05-31 NOTE — Telephone Encounter (Signed)
Called and spoke to daughter and made her aware of Dr. Irish Lack agreed with recommendations form triage RN to continue taking Toprol 12.5 BID. Made her aware that the patient's BP goal is <150/90. Daughter states that the patient is no longer taking the imdur. Daughter states that the higher BPs are before meds and that she checks it before meds since she has had some lower readings. Made her aware that if her BP is high when she checks it she can check it 2 hours after meds to see if it has improved. She states that the patient has had some lower HRs at times 45-55 bpm. Daughter is going to continue to monitor BP and HR with new recs and let us know if she continues to have lower HRs and we will look at other options for BP control.

## 2019-06-12 ENCOUNTER — Encounter: Payer: Self-pay | Admitting: Neurology

## 2019-06-12 ENCOUNTER — Ambulatory Visit: Payer: Medicare Other | Admitting: Neurology

## 2019-06-12 ENCOUNTER — Other Ambulatory Visit: Payer: Self-pay

## 2019-06-12 VITALS — BP 127/27 | HR 65 | Temp 97.7°F

## 2019-06-12 DIAGNOSIS — G232 Striatonigral degeneration: Secondary | ICD-10-CM | POA: Diagnosis not present

## 2019-06-12 DIAGNOSIS — G3281 Cerebellar ataxia in diseases classified elsewhere: Secondary | ICD-10-CM | POA: Diagnosis not present

## 2019-06-12 DIAGNOSIS — R482 Apraxia: Secondary | ICD-10-CM | POA: Insufficient documentation

## 2019-06-12 DIAGNOSIS — G3184 Mild cognitive impairment, so stated: Secondary | ICD-10-CM | POA: Diagnosis not present

## 2019-06-12 MED ORDER — QUETIAPINE FUMARATE 50 MG PO TABS: ORAL_TABLET | ORAL | 3 refills | Status: DC

## 2019-06-12 MED ORDER — CARBIDOPA-LEVODOPA 25-100 MG PO TABS: ORAL_TABLET | ORAL | 2 refills | Status: DC

## 2019-06-12 NOTE — Progress Notes (Signed)
[facility] [address] [place]. [place] [address] [phone]       OFFICE FOLLOW-UP NOTE  Molly. Molly [name] Date of Birth:  [birth year] Medical Record Number:  [id number]   HPI: Molly [name] is 85 year old Caucasian lady seen today for first office follow-up visit following hospital admission for stroke in December 2018. She is accompanied by her daughter. History is obtained from them as well as review of electronic medical records. I have personally reviewed imaging films. Molly Foglemanis a 85 y.o.femalewith PMH of coronary artery disease, hyperlipidemia, hypertension, atrial fibrillation on no anticoagulationpresenting to the emergency room with a last known normal of 9:15 AM on 12/07/2017 withleft-sided weakness and inability to stand.NIHSS 10.CT-scan of the brain -no acute change. Aspects 10. CT Angio of head and neck no large vessel occlusion but distal right M3 stenosis versus clot..CT perfusion no cortical infarct but tissue at risk 9 mL CT perfusion study of the brain shows a small area of penumbra in the right MCA territory with no core. IV TPA given Wed 12/07/17. Episode is likely aborted stroke with right M2 stenosis. MRI scan did not show definite right hemispheric stroke but clinically she had left hemiparalysis complete the stent with a small right brain stroke not seen on MRI likely because patient got tPA. Vascular risk factors identified included atrial fibrillation, hypertension and hyperlipidemia as well as coronary artery disease and advanced age. Plavix was added to the patient's aspirin and statin dose was increased. She was transferred to inpatient rehabilitation from where she made gradual progress and is currently living at home.   02/22/18 visit Dr. Leonie Man: She is opting improvement in left-sided strength but still needs 1 person assist to get up and walk with a walker. She walks very limited. She fatigues very easily and gets tired.  She needs a person to walk close to her. She needs help with activities like dressing herself and feeding herself and cannot be left alone. The patient is getting home physical and occupational therapy and has finished patient therapy the daughters not satisfied and feels patient still has trouble with eating and pocketing on the left side and fatigues easily when sitting. There are plans to consider outpatient TEE and loop recorder for this patient but she has a history of epidural hematoma in the spine while she was on warfarin for DVT several years ago. She seems quite frail and his fall risk and hence I do not believe she is a good long-term candidate for anticoagulation since doing further workup with a TEE and loop recorder is not a good idea in my opinion. I have discussed this with the patient's daughter Molly [name] who is in agreement with my plan  05/25/18 UPDATE: Patient is being seen today for 72-month follow-up and is accompanied by her daughter and granddaughter.  After previous visit, is recommended to continue PT/OT but as patient was unable to afford outpatient visits it was recommended to continue home care but they were unable to obtain at that time due to orders not being placed.  Daughter stated that they decided just to wait until this follow-up visit to have an order placed.  Patient's complaining of increasing memory loss over the past 1 to 2 weeks.  Denies history of any memory loss.  AFT 12, recall 3/3, clock drawing 4/4 and able to do serial additions quickly.  Patient stated that her memory waxes and wanes and at times it can be getting other times she is unable to remember certain  things.  Advised patient that this is most likely due to age-related memory loss and encouraged to do mind exercises.  Patient also has complaining of generalized weakness and deconditioning. States her day consists of sitting in her chair and watching TV as it has been difficult for her to get around. Deconditioning  most likely and would benefit from continued therapies. Continues to take Plavix with mild bruising but no bleeding.  Continues to take Crestor without side effects myalgias.  Blood pressure today 141/69 but per daughter this is monitored at home and is normally lower.  She is using Rollator for ambulation but has used this for approximately 4 years now.  Denies new or worsening stroke/TIA symptoms. Update 01/22/2019 : She is seen today urgently upon request from family.  She has had new complaints of formed visual hallucinations for the last 4 to 5 weeks.  These occur mostly at night and very rarely during the day.  She sees formed figures like bugs, snakes, insects crawling out of her mouth, ear or face.  She has as a result had very poor sleep.  At times she falls asleep and wakes up feeling scared.  As result of not sleeping well she feels quite exhausted and tired during the day.  She gets quite upset when she ceases objects.  She has not noticed any decrease in vision, her memory and cognition have also remained stable without worsening.  She is also had some depth perception issues and feels that when she is walking the floor is uneven.  She feels at times that her bathroom floor is carpeted when it is tired.  She is able to watch television and recognize faces and able to read also quite well without difficulty.  She feels overall weak all over and is currently getting home physical and occupational therapy.  She does have 24-hour care at home.  She at times gets confused and disoriented but this does not stay long.  Family is concerned about these.  She has had chronic tinnitus in the ears and hearing loss but feels of late this seems to have gotten worse and she has constant sound of her swarm of bees in her ears as well.  She has not had any recent brain imaging or lab work done in the last month.  She has not had any significant medication changes either.  There has been no recent head injury or loss of  consciousness or significant headaches or other focal neurological symptoms Update 06/12/2019 : She returns for follow-up today after last virtual follow-up visit on 04/03/2019.  Patient was started on Aricept 10 mg daily by me and appears to be tolerating it well in fact her visualized lesions appear much improved and she has not had them for the last 1 to 2 weeks.  However she has become weak all over and is unable to get up and walk even with physical therapist.  Physical therapy note states that when patient is stood up she has severe gait initiation apraxia and is unable to step requires a lot of effort to do so.  There is also tremulousness of the leg when she stands.  There is also some intermittent tremors in the hands noted as well.  The patient was seen by geriatric psychiatrist Dr. Casimiro Needle who initially tried aripiprazole but patient did not tolerate it due to nausea vomiting and it was stopped.  He recommended trying Nuplazid for psychosis and hallucinations but patient's insurance did not cover it and  she could not afford the cost of  $550 a month and hence it was not tried.  I recommended Risperdal but patient did not try that either.  She is complaining of difficulty sleeping at night now.  Previously she was sleeping much better on Seroquel but this has been discontinued as well.  The patient is also complaining of some weakness in the right hand as well as right side.  She had an MRI scan of the brain done in February which had shown mild generalized atrophy but no infarct.  She does have some chronic right leg weakness from previous degenerative back disease. ROS:   14 system review of systems is positive for weakness, memory loss, visual hallucinations fatigue, activity change, leg swelling, loss of vision, leg tremors, restless leg, insomnia, joint and back pain and swelling, muscle cramps, walking difficulty,   and all other systems negative  PMH:  Past Medical History:  Diagnosis Date    Acid reflux    Anemia    Arthritis    CAD (coronary artery disease)    High cholesterol    Hyperlipidemia    Hypertension    Osteoporosis    Spinal stenosis    Stroke (cerebrum) ([place]) 12/07/2017    Social History:  Social History   Socioeconomic History   Marital status: Married    Spouse name: Not on file   Number of children: Not on file   Years of education: Not on file   Highest education level: Not on file  Occupational History   Not on file  Social Needs   Financial resource strain: Not on file   Food insecurity    Worry: Not on file    Inability: Not on file   Transportation needs    Medical: Not on file    Non-medical: Not on file  Tobacco Use   Smoking status: Never Smoker   Smokeless tobacco: Never Used  Substance and Sexual Activity   Alcohol use: No    Alcohol/week: 0.0 standard drinks   Drug use: No   Sexual activity: Not Currently  Lifestyle   Physical activity    Days per week: Not on file    Minutes per session: Not on file   Stress: Not on file  Relationships   Social connections    Talks on phone: Not on file    Gets together: Not on file    Attends religious service: Not on file    Active member of club or organization: Not on file    Attends meetings of clubs or organizations: Not on file    Relationship status: Not on file   Intimate partner violence    Fear of current or ex partner: Not on file    Emotionally abused: Not on file    Physically abused: Not on file    Forced sexual activity: Not on file  Other Topics Concern   Not on file  Social History Narrative   Not on file    Medications:   Current Outpatient Medications on File Prior to Visit  Medication Sig Dispense Refill   acetaminophen (TYLENOL) 500 MG tablet Take 500 mg by mouth every 8 (eight) hours as needed for mild pain.      ARIPiprazole (ABILIFY) 5 MG tablet Take 1 tablet (5 mg total) by mouth daily. Take at bedtime with the Seroquel  30 tablet 2   bisacodyl (DULCOLAX) 10 MG suppository Place 10 mg rectally as needed for moderate constipation.  Boswellia-Glucosamine-Vit D (OSTEO BI-FLEX ONE PER DAY PO) Take 2,000 Units by mouth 2 (two) times daily.     calcium-vitamin D (OSCAL WITH D) 500-200 MG-UNIT tablet Take 1 tablet by mouth 2 (two) times daily. 120 tablet 0   clopidogrel (PLAVIX) 75 MG tablet TAKE 1 TABLET BY MOUTH EVERY DAY 90 tablet 0   denosumab (PROLIA) 60 MG/ML SOLN injection Inject 60 mg into the skin every 6 (six) months. Administer in upper arm, thigh, or abdomen     donepezil (ARICEPT) 10 MG tablet Take 1 tablet (10 mg total) by mouth daily. 90 tablet 1   fexofenadine (ALLEGRA) 180 MG tablet Take 180 mg by mouth daily as needed for allergies or rhinitis.     gabapentin (NEURONTIN) 100 MG capsule 100 mg. Takes as needed; patient has not taken since she started the seroquel. Takes prn if pain     isosorbide dinitrate (ISORDIL) 10 MG tablet Take 1 tablet by mouth as needed for chest pain (Patient taking differently: Take 1 tablet by mouth as needed for chest pain take only if blood pressure is 140/88) 90 tablet 3   metoprolol succinate (TOPROL-XL) 25 MG 24 hr tablet Take 0.5 tablets (12.5 mg total) by mouth daily with supper. 30 tablet 0   nitroGLYCERIN (NITROSTAT) 0.4 MG SL tablet Place 1 tablet (0.4 mg total) under the tongue every 5 (five) minutes as needed for chest pain (x 3 pills). 25 tablet 3   Omega-3 Fatty Acids (FISH OIL) 1000 MG CAPS Take by mouth.     pantoprazole (PROTONIX) 40 MG tablet Take 1 tablet (40 mg total) by mouth daily. 30 tablet 0   polyethylene glycol powder (MIRALAX) 17 GM/SCOOP powder Take 1 Container by mouth once.     Polysaccharide Iron Complex (NU-IRON PO) Take by mouth.     Probiotic Product (ALIGN) 4 MG CAPS Take 4 mg by mouth daily.      rosuvastatin (CRESTOR) 20 MG tablet Take 20 mg by mouth at bedtime.     TURMERIC PO Take 1 tablet by mouth daily.      vitamin C (ASCORBIC ACID) 250 MG tablet Take 250 mg by mouth daily.     No current facility-administered medications on file prior to visit.     Allergies:   Allergies  Allergen Reactions   Buprenorphine Hcl Shortness Of Breath    Chest pain   Codeine Nausea And Vomiting    ulcers   Morphine And Related Shortness Of Breath    Chest pain, made me crazy   Sulfacetamide Sodium Nausea And Vomiting   Vicodin [Hydrocodone-Acetaminophen] Nausea And Vomiting   Evista [Raloxifene] Other (See Comments) and Nausea And Vomiting    Upset stomach   Hydromorphone Nausea And Vomiting    Sensitive   Other Other (See Comments)    Narcotic-like medications - does NOT tolerate pain meds, causes unrelating volatile vomiting and drops in BP below 80/40 with dizziness and fainting  Tourniquet - Please place tourniquet over cloth - causes sever bruising.  Blood pressure cuff - please use manual blood pressure cuff - causes excoriating pain using machine and bruising      Sulfur Nausea And Vomiting    Vomited until bleeding occurred and developed an active stomach ulcer   Tape Other (See Comments)    Rips skin    Physical Exam General: cachectic frail elderly Caucasian lady, seated, in no evident distress Head: head normocephalic and atraumatic.  Head and neck deviated to the right Neck:  supple with no carotid or supraclavicular bruits Cardiovascular: regular rate and rhythm, no murmurs Musculoskeletal: no deformity except marked kyphosis and slight scoliosis to the right Skin:  no rash/petichiae Vascular:  Normal pulses all extremities ut 2+ pedal edema bilaterally Vitals:   06/12/19 1303  BP: (!) 127/27  Pulse: 65  Temp: 97.7 F (36.5 C)   Neurologic Exam Mental Status: Awake and fully alert. Oriented to place and time. Recent and remote memory diminished. Attention span, concentration and fund of knowledge diminished. Mood and affect appropriate.  AFT 8.  Recall 2/3.  Clock  drawing 3/4.MMSE 24/30 with deficits in orientation, recall and drawing and following three-step commands.  She got only 7 animals which walk on 4 legs.  Clock drawing she scored 3/4. Cranial Nerves: Fundoscopic exam not done. Pupils equal, briskly reactive to light. Extraocular movements full without nystagmus. Visual fields full to confrontation and able to read and write quite well. Hearing diminished bilaterally. Facial sensation intact.mild left lower facial asymmetry., tongue, palate moves normally and symmetrically.  Motor: Generalized weakness noted in all extremities (most likely related to deconditioning) but greater on the right with right foot drop and right grip and hip flexor weakness Sensory.: intact to touch ,pinprick .position and vibratory sensation.  Coordination: Slow but slightly impaired on the right compared to the left  Gait and Station: Unable to get up from the chair by herself and requires two-person assist. stance is stooped and leaning to the right.. Severe gait initiation apraxia and unable to walk  reflexes: 2+ and asymmetric and brisker on the right. Toes downgoing.    ASSESSMENT: 85 year old patient with right MCA branch infarct in December 2018 of cryptogenic etiology with residual mild left hemiparesis.  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  Begin 25/100 half a tablet twice daily with food for a week increase if tolerated to 3 times daily thereafter.  Continue Aricept and the current  dose of 10 mg daily as her visual hallucinations appear to have improved and may consider discontinuing it in the future as  cognitively she seems quite well preserved.  Check MRI scan of the brain to rule out any stroke or other structural lesion.  Check CBC and CMP panel .continue home physical occupational therapy.  Add low-dose Sinemet 50 mg at night to help her sleep.  She was asked to continue follow-up with Dr. Casimiro Needle geriatric psychiatrist as well.  She will continue home physical occupational therapy for gait and balance.  She will return for follow-up in 2 months or call earlier if necessary. Greater than 50% of time during this prolonged 40-minute minute visit was spent on counseling,explanation of diagnosis of peduncular hallucinations, parkinson`s plus, gait apraxia and stroke, reviewing risk factor management of HLD and HTN, planning of further management, discussion with patient and family and coordination of care  Antony Contras, MD  [facility] [address] [place], [place] [address]  Phone [phone] Fax [phone]

## 2019-06-12 NOTE — Patient Instructions (Addendum)
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  Begin 25/100 half a tablet twice daily with food for a week increase if tolerated to 3 times daily thereafter.  Continue Aricept and the current dose of 10 mg daily as her visual hallucinations appear to have improved and may consider discontinuing it in the future as  cognitively she seems quite well preserved.  Check MRI scan of the brain to rule out any stroke or other structural lesion.  Check CBC and CMP panel .continue home physical occupational therapy.  Add low-dose Sinemet 50 mg at night to help her sleep.  She was asked to continue follow-up with Dr. Casimiro Needle geriatric psychiatrist as well.  She will return for follow-up in 2 months or call earlier if necessary.

## 2019-06-13 ENCOUNTER — Telehealth: Payer: Self-pay | Admitting: Neurology

## 2019-06-13 LAB — COMPREHENSIVE METABOLIC PANEL
ALT: 31 IU/L (ref 0–32)
AST: 34 IU/L (ref 0–40)
Albumin/Globulin Ratio: 2.2 (ref 1.2–2.2)
Albumin: 4.4 g/dL (ref 3.6–4.6)
Alkaline Phosphatase: 40 IU/L (ref 39–117)
BUN/Creatinine Ratio: 18 (ref 12–28)
BUN: 16 mg/dL (ref 8–27)
Bilirubin Total: 0.4 mg/dL (ref 0.0–1.2)
CO2: 22 mmol/L (ref 20–29)
Calcium: 9.4 mg/dL (ref 8.7–10.3)
Chloride: 102 mmol/L (ref 96–106)
Creatinine, Ser: 0.89 mg/dL (ref 0.57–1.00)
GFR calc Af Amer: 67 mL/min/{1.73_m2} (ref >59)
GFR calc non Af Amer: 58 mL/min/{1.73_m2} — ABNORMAL LOW (ref >59)
Globulin, Total: 2 g/dL (ref 1.5–4.5)
Glucose: 107 mg/dL — ABNORMAL HIGH (ref 65–99)
Potassium: 4.5 mmol/L (ref 3.5–5.2)
Sodium: 138 mmol/L (ref 134–144)
Total Protein: 6.4 g/dL (ref 6.0–8.5)

## 2019-06-13 LAB — CBC WITH DIFFERENTIAL
Basophils Absolute: 0 10*3/uL (ref 0.0–0.2)
Basos: 1 %
EOS (ABSOLUTE): 0.1 10*3/uL (ref 0.0–0.4)
Eos: 1 %
Hematocrit: 38 % (ref 34.0–46.6)
Hemoglobin: 12.9 g/dL (ref 11.1–15.9)
Immature Grans (Abs): 0 10*3/uL (ref 0.0–0.1)
Immature Granulocytes: 0 %
Lymphocytes Absolute: 1.6 10*3/uL (ref 0.7–3.1)
Lymphs: 29 %
MCH: 31 pg (ref 26.6–33.0)
MCHC: 33.9 g/dL (ref 31.5–35.7)
MCV: 91 fL (ref 79–97)
Monocytes Absolute: 0.7 10*3/uL (ref 0.1–0.9)
Monocytes: 12 %
Neutrophils Absolute: 3.3 10*3/uL (ref 1.4–7.0)
Neutrophils: 57 %
RBC: 4.16 x10E6/uL (ref 3.77–5.28)
RDW: 13.4 % (ref 11.7–15.4)
WBC: 5.7 10*3/uL (ref 3.4–10.8)

## 2019-06-13 NOTE — Telephone Encounter (Signed)
[employer] medicare order sent to GI. No auth they will reach out to the patient to schedule.  

## 2019-06-15 ENCOUNTER — Telehealth: Payer: Self-pay | Admitting: Internal Medicine

## 2019-06-15 NOTE — Telephone Encounter (Signed)
1:30 pm: TC to schedule follow up palliative care tele-medicine visit on 06/25/2019 at [facility] [phone]

## 2019-06-25 ENCOUNTER — Encounter: Payer: Self-pay | Admitting: Internal Medicine

## 2019-06-25 ENCOUNTER — Other Ambulatory Visit: Payer: Self-pay

## 2019-06-25 ENCOUNTER — Other Ambulatory Visit: Payer: Medicare Other | Admitting: Internal Medicine

## 2019-06-25 DIAGNOSIS — Z515 Encounter for palliative care: Secondary | ICD-10-CM

## 2019-06-25 NOTE — Progress Notes (Addendum)
July 13th, 2020 [facility] Consult Note Telephone: [phone]  Fax: [phone]   Due to the current COVID-19 infection/crises, the patient and family prefer, and have given their verbal consent for, a provider visit via telemedicine, from my office. HIPPA policies of confidentially were discussed.    PATIENT [name]: Molly [name] DOB: 1929-07-18 MRN: [id number]   PRIMARY CARE PROVIDER:   [name], MD [place] [address] [facility] [address] [place], [place] [address]   REFERRING PROVIDER: Marcelino Scot (Neurology).  Referral pending Dr. Norma Fredrickson (apt May 20)   RESPONSIBLE PARTY: *(dtr) Allene Dillon [phone] e-mail: [email or web address]. (dtr) Ms Ishmael Holter, e-mail:  [email or web address]    1.Visual, auditory, and tactile hallucinations: Marked improvement with initiation by Dr. Leonie Man (OV 6/30) of Sinemet with continuation of Aricept. Patient reports just residual tactile sensation of "toothpaste consistency secretions coming out of my nose"; unidentifiable to observers. Patient was restarted on bedtime dose Seroquel for insomnia, but SE of decreased daytime mobility/daytime fatigue. Dr. Leonie Man considering discontinuation of Aricept, per family report, but patient's daughters are a little nervous as they wonder if the Aricept was responsible for quelling the hallucinations, and as there still appear to be some residual tactile hallucinations yet. Daughters report patient with symptoms discouragement/depression Pnd Dr. Clydene Fake review:  -discontinue Seroquel. Consider Trazodone 15mg  qhs prn insomnia.  -continue Aricept for now  -consider initiation Celexa 10mg  qd  2. Stage one pressure injury buttocks: Confirmed with patient and her daughters importance of position changes q 2 hrs, use of moisture barrier cream, and importance of adequate nutrition  3. Diminished appetite: Seems to be consuming adequate colories to maintain weight.  Daughters provide protein shakes/favorite foods.   4. Functional decline: Mobility and stamina improved with Sinemet.    5. Advanced Care Directives: DNR/MOST form on chart and in home.   6. F/U: Telehealth visit scheduled for Tues 7/14//2020 at 2pm, per family request, to continue discussions goals of care.   I spent 30 minutes providing this consultation, from 11am to 11:30am. More than 50% of the time in this consultation was spent coordinating communication.    HISTORY OF PRESENT ILLNESS:  Molly Marcucci Molly an 85 y.o. female with medical h/o CAD, HLD, HTN, and osteoporosis. This Molly a f/u Palliative Care visit from 04/16/2019 to help with symptom management and ongoing discussions re: goals of care.    CODE STATUS: DNR. MOST: DNR/DNI (completed 04/13/2019). Scope of Medical Interventions: Comfort measures. Antibiotics: Determine [place] or limitations at time of infection. No to IVFs, no to tube feedings.    PPS: weak 40% (from 30%)  HOSPICE ELIGIBILITY/DIAGNOSIS: TBD   PAST MEDICAL HISTORY:  Past Medical History:  Diagnosis Date  . Acid reflux   . Anemia   . Arthritis   . CAD (coronary artery disease)   . High cholesterol   . Hyperlipidemia   . Hypertension   . Osteoporosis   . Spinal stenosis   . Stroke (cerebrum) ([place]) 12/07/2017    SOCIAL HX:  Social History   Tobacco Use  . Smoking status: Never Smoker  . Smokeless tobacco: Never Used  Substance Use Topics  . Alcohol use: No    Alcohol/week: 0.0 standard drinks    ALLERGIES:  Allergies  Allergen Reactions  . Buprenorphine Hcl Shortness Of Breath    Chest pain  . Codeine Nausea And Vomiting    ulcers  . Morphine And Related Shortness Of Breath    Chest pain, made me crazy  .  Sulfacetamide Sodium Nausea And Vomiting  . Vicodin [Hydrocodone-Acetaminophen] Nausea And Vomiting  . Evista [Raloxifene] Other (See Comments) and Nausea And Vomiting    Upset stomach  . Hydromorphone Nausea And Vomiting    Sensitive   . Other Other (See Comments)    Narcotic-like medications - does NOT tolerate pain meds, causes unrelating volatile vomiting and drops in BP below 80/40 with dizziness and fainting  Tourniquet - Please place tourniquet over cloth - causes sever bruising.  Blood pressure cuff - please use manual blood pressure cuff - causes excoriating pain using machine and bruising     . Sulfur Nausea And Vomiting    Vomited until bleeding occurred and developed an active stomach ulcer  . Tape Other (See Comments)    Rips skin     PERTINENT MEDICATIONS:  Outpatient Encounter Medications as of 06/25/2019  Medication Sig  . acetaminophen (TYLENOL) 500 MG tablet Take 500 mg by mouth every 8 (eight) hours as needed for mild pain.   . ARIPiprazole (ABILIFY) 5 MG tablet Take 1 tablet (5 mg total) by mouth daily. Take at bedtime with the Seroquel  . bisacodyl (DULCOLAX) 10 MG suppository Place 10 mg rectally as needed for moderate constipation.  . Boswellia-Glucosamine-Vit D (OSTEO BI-FLEX ONE PER DAY PO) Take 2,000 Units by mouth 2 (two) times daily.  . calcium-vitamin D (OSCAL WITH D) 500-200 MG-UNIT tablet Take 1 tablet by mouth 2 (two) times daily.  . carbidopa-levodopa (SINEMET) 25-100 MG tablet Start 1/2 tablet twice daily with food x 1 week and then three times daily  . clopidogrel (PLAVIX) 75 MG tablet TAKE 1 TABLET BY MOUTH EVERY DAY  . denosumab (PROLIA) 60 MG/ML SOLN injection Inject 60 mg into the skin every 6 (six) months. Administer in upper arm, thigh, or abdomen  . donepezil (ARICEPT) 10 MG tablet Take 1 tablet (10 mg total) by mouth daily.  . fexofenadine (ALLEGRA) 180 MG tablet Take 180 mg by mouth daily as needed for allergies or rhinitis.  Marland Kitchen gabapentin (NEURONTIN) 100 MG capsule 100 mg. Takes as needed; patient has not taken since she started the seroquel. Takes prn if pain  . isosorbide dinitrate (ISORDIL) 10 MG tablet Take 1 tablet by mouth as needed for chest pain (Patient taking  differently: Take 1 tablet by mouth as needed for chest pain take only if blood pressure Molly 140/88)  . metoprolol succinate (TOPROL-XL) 25 MG 24 hr tablet Take 0.5 tablets (12.5 mg total) by mouth daily with supper.  . nitroGLYCERIN (NITROSTAT) 0.4 MG SL tablet Place 1 tablet (0.4 mg total) under the tongue every 5 (five) minutes as needed for chest pain (x 3 pills).  . Omega-3 Fatty Acids (FISH OIL) 1000 MG CAPS Take by mouth.  . pantoprazole (PROTONIX) 40 MG tablet Take 1 tablet (40 mg total) by mouth daily.  . polyethylene glycol powder (MIRALAX) 17 GM/SCOOP powder Take 1 Container by mouth once.  . Polysaccharide Iron Complex (NU-IRON PO) Take by mouth.  . Probiotic Product (ALIGN) 4 MG CAPS Take 4 mg by mouth daily.   . QUEtiapine (SEROQUEL) 50 MG tablet TAKE 1 TABLET BY MOUTH at bedtime only  . rosuvastatin (CRESTOR) 20 MG tablet Take 20 mg by mouth at bedtime.  . TURMERIC PO Take 1 tablet by mouth daily.  . vitamin C (ASCORBIC ACID) 250 MG tablet Take 250 mg by mouth daily.   No facility-administered encounter medications on file as of 06/25/2019.     PHYSICAL EXAM:  Deferred d/t nature of telehealth visit.  Well nourished elderly female sitting up in recliner; minimal facial expression but A & O x 3.  Julianne Handler, NP

## 2019-06-26 ENCOUNTER — Other Ambulatory Visit: Payer: Self-pay

## 2019-06-26 ENCOUNTER — Encounter: Payer: Self-pay | Admitting: Internal Medicine

## 2019-06-26 ENCOUNTER — Other Ambulatory Visit: Payer: Medicare Other | Admitting: Internal Medicine

## 2019-06-26 DIAGNOSIS — Z515 Encounter for palliative care: Secondary | ICD-10-CM

## 2019-06-26 NOTE — Progress Notes (Signed)
July 14th, 2020 [facility] Consult Note Telephone: [phone]  Fax: [phone]   Due to the current COVID-19 infection/crises, the family prefer, and have given their verbal consent for, a provider visit via telemedicine. HIPPA policies of confidentially were discussed. Video-audio (telehealth) contact was unable to be done due technical barriers from the patient's side.   PATIENT NAME: Molly [name] DOB: 1928-12-23 MRN: [id number]   PRIMARY CARE PROVIDER:   [name], MD [place] [facility] [place], [place] [address]   REFERRING PROVIDER: Dr. Antony Contras (Neurology).  Referral pending Dr. Norma Fredrickson (apt May 20)   RESPONSIBLE PARTY: *(dtr) Allene Dillon [phone] e-mail: [email or web address]. (dtr) Ms Ishmael Holter, e-mail:  [email or web address]   ASSESSMENT/RECOMMENDATIONS: 1. Functional decline:  Ambulation is improved; now independent with walker with just one person steadying hand. Continues need for 1 person transfer. Needs total assist with hygiene and dressing. Daughter reports patient with decreased ability to grasp eating utensils with R hand. Some delay in swallowing noted, though no signs of coughing/aspiration. Patient continues with PT thought OT and SP completed. She is incontinent of urine due to slow transient time to commode. Current weight is 149lbs. At [place]" her BMI is 27.2 kg/m2. Improvement in RUE tremors on Sinemet.   2. Advanced Care Directives: DNR/MOST form on chart and in home.   3. F/U: Telehealth visit (ZOOM) scheduled for Monday 7/27//2020 at 2pm, per family request, to continue discussions goals of care.   I spent 30 minutes providing this consultation, from 2 pm to 2:30pm. More than 50% of the time in this consultation was spent coordinating communication.    HISTORY OF PRESENT ILLNESS:  Molly [name] is an 85 y.o. female with medical h/o CAD, HLD, HTN, and osteoporosis. This is a  f/u Palliative Care visit from 06/25/2019 per family request, updating patient's functional status and medication list.   CODE STATUS: DNR. MOST: DNR/DNI (completed 04/13/2019). Scope of Medical Interventions: Comfort measures. Antibiotics: Determine [place] or limitations at time of infection. No to IVFs, no tube feedings.    PPS: weak 40% (from 30%)   HOSPICE ELIGIBILITY/DIAGNOSIS: TBD   PAST MEDICAL HISTORY:  Past Medical History:  Diagnosis Date  . Acid reflux   . Anemia   . Arthritis   . CAD (coronary artery disease)   . High cholesterol   . Hyperlipidemia   . Hypertension   . Osteoporosis   . Spinal stenosis   . Stroke (cerebrum) ([place]) 12/07/2017    SOCIAL HX:  Social History   Tobacco Use  . Smoking status: Never Smoker  . Smokeless tobacco: Never Used  Substance Use Topics  . Alcohol use: No    Alcohol/week: 0.0 standard drinks    ALLERGIES:  Allergies  Allergen Reactions  . Buprenorphine Hcl Shortness Of Breath    Chest pain  . Codeine Nausea And Vomiting    ulcers  . Morphine And Related Shortness Of Breath    Chest pain, made me crazy  . Sulfacetamide Sodium Nausea And Vomiting  . Vicodin [Hydrocodone-Acetaminophen] Nausea And Vomiting  . Evista [Raloxifene] Other (See Comments) and Nausea And Vomiting    Upset stomach  . Hydromorphone Nausea And Vomiting    Sensitive  . Other Other (See Comments)    Narcotic-like medications - does NOT tolerate pain meds, causes unrelating volatile vomiting and drops in BP below 80/40 with dizziness and fainting  Tourniquet - Please place tourniquet over cloth - causes sever bruising.  Blood pressure  cuff - please use manual blood pressure cuff - causes excoriating pain using machine and bruising     . Sulfur Nausea And Vomiting    Vomited until bleeding occurred and developed an active stomach ulcer  . Tape Other (See Comments)    Rips skin     PERTINENT MEDICATIONS:  Outpatient Encounter Medications as of 06/26/2019   Medication Sig  . acetaminophen (TYLENOL) 500 MG tablet Take 500 mg by mouth every 8 (eight) hours as needed for mild pain.   . bisacodyl (DULCOLAX) 10 MG suppository Place 10 mg rectally as needed for moderate constipation.  . Boswellia-Glucosamine-Vit D (OSTEO BI-FLEX ONE PER DAY PO) Take 2,000 Units by mouth 2 (two) times daily.  . calcium-vitamin D (OSCAL WITH D) 500-200 MG-UNIT tablet Take 1 tablet by mouth 2 (two) times daily.  . carbidopa-levodopa (SINEMET) 25-100 MG tablet Start 1/2 tablet twice daily with food x 1 week and then three times daily (Patient taking differently: 0.5 tablets 3 (three) times daily. Start 1/2 tablet twice daily with food x 1 week and then three times daily)  . clopidogrel (PLAVIX) 75 MG tablet TAKE 1 TABLET BY MOUTH EVERY DAY  . denosumab (PROLIA) 60 MG/ML SOLN injection Inject 60 mg into the skin every 6 (six) months. Administer in upper arm, thigh, or abdomen  . donepezil (ARICEPT) 10 MG tablet Take 1 tablet (10 mg total) by mouth daily.  . fexofenadine (ALLEGRA) 180 MG tablet Take 180 mg by mouth daily as needed for allergies or rhinitis.  Marland Kitchen isosorbide dinitrate (ISORDIL) 10 MG tablet Take 1 tablet by mouth as needed for chest pain (Patient taking differently: Take 1 tablet by mouth as needed for chest pain take only if blood pressure is 140/88)  . metoprolol succinate (TOPROL-XL) 25 MG 24 hr tablet Take 0.5 tablets (12.5 mg total) by mouth daily with supper.  . nitroGLYCERIN (NITROSTAT) 0.4 MG SL tablet Place 1 tablet (0.4 mg total) under the tongue every 5 (five) minutes as needed for chest pain (x 3 pills).  . Omega-3 Fatty Acids (FISH OIL) 1000 MG CAPS Take by mouth.  . pantoprazole (PROTONIX) 40 MG tablet Take 1 tablet (40 mg total) by mouth daily. (Patient taking differently: Take 40 mg by mouth 2 (two) times daily. )  . polyethylene glycol powder (MIRALAX) 17 GM/SCOOP powder Take 1 Container by mouth once.  . Polysaccharide Iron Complex (NU-IRON PO) Take  by mouth.  . Probiotic Product (ALIGN) 4 MG CAPS Take 4 mg by mouth daily.   . QUEtiapine (SEROQUEL) 50 MG tablet TAKE 1 TABLET BY MOUTH at bedtime only  . rosuvastatin (CRESTOR) 20 MG tablet Take 20 mg by mouth at bedtime.  . vitamin C (ASCORBIC ACID) 250 MG tablet Take 250 mg by mouth daily.  Marland Kitchen gabapentin (NEURONTIN) 100 MG capsule 100 mg. Takes as needed; patient has not taken since she started the seroquel. Takes prn if pain  . [DISCONTINUED] ARIPiprazole (ABILIFY) 5 MG tablet Take 1 tablet (5 mg total) by mouth daily. Take at bedtime with the Seroquel  . [DISCONTINUED] TURMERIC PO Take 1 tablet by mouth daily.   No facility-administered encounter medications on file as of 06/26/2019.     PHYSICAL EXAM:   PE Deferred d/t nature of telehealth visit (audio only).   Julianne Handler, NP

## 2019-06-27 ENCOUNTER — Other Ambulatory Visit: Payer: Self-pay

## 2019-06-27 ENCOUNTER — Ambulatory Visit: Payer: Medicare Other

## 2019-06-27 MED ORDER — TRAZODONE HCL 50 MG PO TABS: 50.0000 mg | ORAL_TABLET | Freq: Every day | ORAL | 1 refills | Status: DC

## 2019-06-27 NOTE — Progress Notes (Signed)
trr

## 2019-06-28 ENCOUNTER — Other Ambulatory Visit (HOSPITAL_COMMUNITY): Payer: Self-pay

## 2019-06-28 ENCOUNTER — Telehealth (HOSPITAL_COMMUNITY): Payer: Self-pay

## 2019-06-28 NOTE — Telephone Encounter (Signed)
Patients daughter is calling, patient saw doctor Leonie Man and he is recommending Celexa for depression. Patients daughter wants to know what you think. Dr. Leonie Man also changed the Seroquel to Trazodone. Please review and advise, thank you

## 2019-06-29 MED ORDER — CITALOPRAM HYDROBROMIDE 10 MG PO TABS: 10.0000 mg | ORAL_TABLET | Freq: Every day | ORAL | 2 refills | Status: DC

## 2019-06-29 NOTE — Telephone Encounter (Signed)
Spoke with Dr. Casimiro Needle and reviewed medication, he agreed to send in the Celexa, 10 mg one tab daily.

## 2019-07-09 ENCOUNTER — Other Ambulatory Visit: Payer: Self-pay

## 2019-07-09 ENCOUNTER — Encounter: Payer: Self-pay | Admitting: Internal Medicine

## 2019-07-09 ENCOUNTER — Other Ambulatory Visit: Payer: Medicare Other | Admitting: Internal Medicine

## 2019-07-09 DIAGNOSIS — Z515 Encounter for palliative care: Secondary | ICD-10-CM

## 2019-07-09 NOTE — Progress Notes (Signed)
July 27th, 2020 [facility] Consult Note Telephone: [phone]  Fax: [phone]   Due to the current COVID-19 infection/crises, the family prefer, and have given their verbal consent for, a provider visit via telemedicine. HIPPA policies of confidentially were discussed. Video-audio (telehealth) contact was unable to be done due technical barriers from the patient's side.   PATIENT NAME: Molly [name] DOB: Nov 01, 1929 MRN: [id number]   PRIMARY CARE PROVIDER:   [name], MD [place] [address] [facility] [address] [place], [place] [address]   REFERRING PROVIDER: Dr. Antony Contras (Neurology).  Referral pending Dr. Norma Fredrickson   RESPONSIBLE PARTY: *(dtr) Allene Dillon [phone] e-mail: [email or web address]. (dtr) Ms Ishmael Holter, e-mail:  [email or web address]      ASSESSMENT/RECOMMENDATIONS:  1.Functional decline: Patient with progressive weakness; now needing assist with transfers, or with repositioning (electric bed). She is working with Physical therapy, who are helping family to acquire an electric wheelchair, so patient can broaden her horizons of travel about the home. Patient is able to move her feet a little better since on Sinemet, but perhaps not as well as when initially started on this med. Ambulating with walker and 1 person assist. Patient without further tactile or visual hallucinations, or disturbing dreams, over these last 3 days or so. Seroquel had been discontinued (Dr. Sung Amabile).  Patient  is sleeping better on low dose Restoril; family are giving just  of the prescribed dose, as the higher doses seem to make her groggy/slightly nauseous the next day. But even on the adjusted down dose, there seems to be a little of a hangover effect (fatigue, lack of interest in activities). They are considering just using on a prn basis. Dr. Leonie Man has decreased Aricept to 5mg /d (from 10mg ). Celexa has been started (10mg ) and Leonie Man reports patient  less anxious / agitated. [name] reports patient with a decreased appetite. They haven't weighed her lately but patient appears thinner in the face, waist, and hips. Her last weight was 149 lbs (about 3 weeks ago?). At [place]" her BMI is 27.2 kg/m2. Continues with difficulty handling eating utensils with R hand, but tremors improved. Patient has a f/u MRI of the brain scheduled Aug 11th  (Dr. Aug 24), a f/u OV with psych (Dr. Leonie Man ) Aug 12th, then an OV with Dr. Aug 25 in September. Patient bot.red with very drippy nose; no association with sinemet dosing. She is on Allergia.  -discussed following exercise regimen as outlined by PT  -Start OTC Flonase 1 spray each nare bid  2.Family supports/Care giver stress: October is PCG. [name] has physical ailments that make physical care of patient taxing. Family have been looking for care giver aide help through various agencies but finding a good fit has been difficult. They currently have 3 different aides who work part time, but overall suboptimal coverage.   -we touched upon possible hospice option, should trajectory of this decline continue. Keeping this option open but tabled.  4. Advanced Care Directives: DNR/MOST form on chart and in home.   3. F/U: Telehealth visit (ZOOM) scheduled Monday, Aug 24th at 2pm   I spent 60 minutes providing this consultation, from 2 pm to 3 pm. More than 50% of the time in this consultation was spent coordinating communication.    HISTORY OF PRESENT ILLNESS:  Molly [name] is an 85 y.o. female with medical h/o CAD, HLD, HTN, auditory/tactile hallucinations (improved) and osteoporosis. This is a f/u Palliative Care visit from 06/26/2019.  CODE STATUS: DNR. MOST: DNR/DNI (completed 04/13/2019). Scope of  Medical Interventions: Comfort measures. Antibiotics: Determine [place] or limitations at time of infection. No to IVFs, no tube feedings.    PPS: weak 40% (from 30%)   HOSPICE ELIGIBILITY/DIAGNOSIS: TBD  PAST MEDICAL HISTORY:   Past Medical History:  Diagnosis Date  . Acid reflux   . Anemia   . Arthritis   . CAD (coronary artery disease)   . High cholesterol   . Hyperlipidemia   . Hypertension   . Osteoporosis   . Spinal stenosis   . Stroke (cerebrum) ([place]) 12/07/2017    SOCIAL HX:  Social History   Tobacco Use  . Smoking status: Never Smoker  . Smokeless tobacco: Never Used  Substance Use Topics  . Alcohol use: No    Alcohol/week: 0.0 standard drinks    ALLERGIES:  Allergies  Allergen Reactions  . Buprenorphine Hcl Shortness Of Breath    Chest pain  . Codeine Nausea And Vomiting    ulcers  . Morphine And Related Shortness Of Breath    Chest pain, made me crazy  . Sulfacetamide Sodium Nausea And Vomiting  . Vicodin [Hydrocodone-Acetaminophen] Nausea And Vomiting  . Evista [Raloxifene] Other (See Comments) and Nausea And Vomiting    Upset stomach  . Hydromorphone Nausea And Vomiting    Sensitive  . Other Other (See Comments)    Narcotic-like medications - does NOT tolerate pain meds, causes unrelating volatile vomiting and drops in BP below 80/40 with dizziness and fainting  Tourniquet - Please place tourniquet over cloth - causes sever bruising.  Blood pressure cuff - please use manual blood pressure cuff - causes excoriating pain using machine and bruising     . Sulfur Nausea And Vomiting    Vomited until bleeding occurred and developed an active stomach ulcer  . Tape Other (See Comments)    Rips skin     PERTINENT MEDICATIONS:  Outpatient Encounter Medications as of 07/09/2019  Medication Sig  . acetaminophen (TYLENOL) 500 MG tablet Take 500 mg by mouth every 8 (eight) hours as needed for mild pain.   . bisacodyl (DULCOLAX) 10 MG suppository Place 10 mg rectally as needed for moderate constipation.  . Boswellia-Glucosamine-Vit D (OSTEO BI-FLEX ONE PER DAY PO) Take 2,000 Units by mouth 2 (two) times daily.  . calcium-vitamin D (OSCAL WITH D) 500-200 MG-UNIT tablet Take 1 tablet by  mouth 2 (two) times daily.  . carbidopa-levodopa (SINEMET) 25-100 MG tablet Start 1/2 tablet twice daily with food x 1 week and then three times daily (Patient taking differently: 0.5 tablets 3 (three) times daily. Start 1/2 tablet twice daily with food x 1 week and then three times daily)  . citalopram (CELEXA) 10 MG tablet Take 1 tablet (10 mg total) by mouth daily.  . clopidogrel (PLAVIX) 75 MG tablet TAKE 1 TABLET BY MOUTH EVERY DAY  . denosumab (PROLIA) 60 MG/ML SOLN injection Inject 60 mg into the skin every 6 (six) months. Administer in upper arm, thigh, or abdomen  . donepezil (ARICEPT) 10 MG tablet Take 1 tablet (10 mg total) by mouth daily. (Patient taking differently: Take 5 mg by mouth daily. )  . fexofenadine (ALLEGRA) 180 MG tablet Take 180 mg by mouth daily as needed for allergies or rhinitis.  Marland Kitchen gabapentin (NEURONTIN) 100 MG capsule 100 mg. Takes as needed; patient has not taken since she started the seroquel. Takes prn if pain  . isosorbide dinitrate (ISORDIL) 10 MG tablet Take 1 tablet by mouth as needed for chest pain (Patient taking differently: Take  1 tablet by mouth as needed for chest pain take only if blood pressure is 140/88)  . metoprolol succinate (TOPROL-XL) 25 MG 24 hr tablet Take 0.5 tablets (12.5 mg total) by mouth daily with supper.  . nitroGLYCERIN (NITROSTAT) 0.4 MG SL tablet Place 1 tablet (0.4 mg total) under the tongue every 5 (five) minutes as needed for chest pain (x 3 pills).  . Omega-3 Fatty Acids (FISH OIL) 1000 MG CAPS Take by mouth.  . pantoprazole (PROTONIX) 40 MG tablet Take 1 tablet (40 mg total) by mouth daily. (Patient taking differently: Take 40 mg by mouth 2 (two) times daily. )  . polyethylene glycol powder (MIRALAX) 17 GM/SCOOP powder Take 1 Container by mouth once.  . Polysaccharide Iron Complex (NU-IRON PO) Take by mouth.  . Probiotic Product (ALIGN) 4 MG CAPS Take 4 mg by mouth daily.   . rosuvastatin (CRESTOR) 20 MG tablet Take 20 mg by mouth  at bedtime.  . traZODone (DESYREL) 50 MG tablet Take 1 tablet (50 mg total) by mouth at bedtime.  . vitamin C (ASCORBIC ACID) 250 MG tablet Take 250 mg by mouth daily.   No facility-administered encounter medications on file as of 07/09/2019.     PHYSICAL EXAM:   PE Deferred d/t nature of telehealth visit (audio only).   Julianne Handler, NP

## 2019-07-10 ENCOUNTER — Telehealth: Payer: Self-pay | Admitting: Interventional Cardiology

## 2019-07-10 NOTE — Telephone Encounter (Signed)
New Message   Patient's daughter Benjaman Pott wants to follow up with PA Sheliah Plane via message that was sent on 07/02/19 then 07/06/19. Patient's daughter is requesting a call back to discuss what is going on with the patient.

## 2019-07-10 NOTE — Telephone Encounter (Signed)
Daughter would like to speak with [place] about pt's medications.  Daughter doesn't feel like it's a good idea to hold Metoprolol now that it has finally gotten pt's BP under control.  She does report she and [place] discussed a different medication possibility that would control pt's BP but not effect the HR.  She will wait to discuss this with [place] when she returns to the office.

## 2019-07-16 NOTE — Telephone Encounter (Signed)
Dr. Irish Lack responded to MyChart message from 7/17 with additional recommendations:  July 13, 2019 Jettie Booze, MD to [facility] you are well.  If the BP spikes when the HR is low, you can try the low dose (half tab) of Imdur to see if that helps with a BP spike.  Otherwise, continue the metoprolol since that has seemed to help of late.  JV

## 2019-07-17 ENCOUNTER — Other Ambulatory Visit: Payer: Self-pay

## 2019-07-24 ENCOUNTER — Other Ambulatory Visit: Payer: Medicare Other

## 2019-07-25 ENCOUNTER — Other Ambulatory Visit: Payer: Self-pay

## 2019-07-25 ENCOUNTER — Ambulatory Visit (HOSPITAL_COMMUNITY): Payer: Medicare Other | Admitting: Psychiatry

## 2019-08-03 ENCOUNTER — Other Ambulatory Visit: Payer: Self-pay | Admitting: Neurology

## 2019-08-06 ENCOUNTER — Other Ambulatory Visit: Payer: Self-pay

## 2019-08-06 ENCOUNTER — Other Ambulatory Visit: Payer: Medicare Other | Admitting: Internal Medicine

## 2019-08-06 DIAGNOSIS — Z515 Encounter for palliative care: Secondary | ICD-10-CM

## 2019-08-08 ENCOUNTER — Encounter: Payer: Self-pay | Admitting: Internal Medicine

## 2019-08-08 NOTE — Progress Notes (Signed)
Aug 24th, 2020 [facility] Palliative Care Consult Note Telephone: [phone]  Fax: [phone]   Due to the current COVID-19 infection/crises, the family prefer, and have given their verbal consent for, a provider visit via telemedicine. HIPPA policies of confidentially were discussed. Video-audio (telehealth) contact was unable to be done due technical barriers from the patients side.   PATIENT NAME: Molly [name] DOB: 1929/05/04 MRN: [id number]   PRIMARY CARE PROVIDER:   [name], MD [place] [employer] [place], [place] [address]   REFERRING PROVIDER: Dr. Antony Contras (Neurology).  Dr. Norma Fredrickson (Psych) Dr. Larae Grooms (Cardiology) Dr. Antony Contras (Neurology)   RESPONSIBLE PARTY: *(dtr) Molly [name] [phone] e-mail: [email or web address]. (dtr) Ms Ishmael Holter, e-mail:  [email or web address]    ASSESSMENT/RECOMMENDATIONS: 1.Advance Care Planning: A. Advanced Care Directives: DNR/MOST form in [place] and in home. B. Goals of Care: Maintain strength and function. Optimize mobility as best she can.  2.Functional status:  Easily fatigued and worn out after ambulating short distances. Ongoing attempts efforts to obtain motorized wheelchair (from [facility]) to expand her independence. She needs a Face to Face visit with her PCP to complete the application. Patient needs assist with transfers and can ambulate with walker with stand by assist. She is dealing with early morning stiffness and plans to f/u with Dr. Leonie Man for possible medication adjustment to address that. She is sleeping better with Restoril  tab at hs. Happily, she is no longer plagued by tactile and visual hallucinations. She admits to getting depressed d/t limited mobility, and easy fatigue. She is on Celexa and followed by Dr. Casimiro Needle. She continues with a drippy nose despite trial of Flonase, so family stopped that med. She continues with a R hand  intentional tremor.  -Request Palliative care SW to consult; supportive counseling setting of chronic illness.  3.Family supports (from previous note): [name] is PCG. She has physical ailments that make physical care of patient taxing.              -we touched upon possible hospice option, should trajectory of this decline continue. Keeping this option open but tabled.     4. F/U: Telehealth visit ([facility]) scheduled Monday, 09/10/2019 at 2 pm   I spent 30 minutes providing this consultation, from 2 pm to 2:30 pm. More than 50% of the time in this consultation was spent coordinating communication.    HISTORY OF PRESENT ILLNESS:  Molly [name] is an 85 y.o. female with medical h/o CAD, HLD, HTN, auditory/tactile hallucinations (improved) and osteoporosis. This is a f/u Palliative Care visit from 07/09/2019.   CODE STATUS: DNR. MOST: DNR/DNI (completed 04/13/2019). Scope of Medical Interventions: Comfort measures. Antibiotics: Determine [place] or limitations at time of infection. No to IVFs, no tube feedings.    PPS: weak 40% (from 30%)   HOSPICE ELIGIBILITY/DIAGNOSIS: TBD   PAST MEDICAL HISTORY:  Past Medical History:  Diagnosis Date   Acid reflux    Anemia    Arthritis    CAD (coronary artery disease)    High cholesterol    Hyperlipidemia    Hypertension    Osteoporosis    Spinal stenosis    Stroke (cerebrum) ([place]) 12/07/2017    SOCIAL HX:  Social History   Tobacco Use   Smoking status: Never Smoker   Smokeless tobacco: Never Used  Substance Use Topics   Alcohol use: No    Alcohol/week: 0.0 standard drinks    ALLERGIES:  Allergies  Allergen Reactions  Buprenorphine Hcl Shortness Of Breath    Chest pain   Codeine Nausea And Vomiting    ulcers   Morphine And Related Shortness Of Breath    Chest pain, made me crazy   Sulfacetamide Sodium Nausea And Vomiting   Vicodin [Hydrocodone-Acetaminophen] Nausea And Vomiting   Evista [Raloxifene] Other (See  Comments) and Nausea And Vomiting    Upset stomach   Hydromorphone Nausea And Vomiting    Sensitive   Other Other (See Comments)    Narcotic-like medications - does NOT tolerate pain meds, causes unrelating volatile vomiting and drops in BP below 80/40 with dizziness and fainting  Tourniquet - Please place tourniquet over cloth - causes sever bruising.  Blood pressure cuff - please use manual blood pressure cuff - causes excoriating pain using machine and bruising      Sulfur Nausea And Vomiting    Vomited until bleeding occurred and developed an active stomach ulcer   Tape Other (See Comments)    Rips skin     PERTINENT MEDICATIONS:  Outpatient Encounter Medications as of 08/06/2019  Medication Sig   acetaminophen (TYLENOL) 500 MG tablet Take 500 mg by mouth every 8 (eight) hours as needed for mild pain.    bisacodyl (DULCOLAX) 10 MG suppository Place 10 mg rectally as needed for moderate constipation.   Boswellia-Glucosamine-Vit D (OSTEO BI-FLEX ONE PER DAY PO) Take 2,000 Units by mouth 2 (two) times daily.   calcium-vitamin D (OSCAL WITH D) 500-200 MG-UNIT tablet Take 1 tablet by mouth 2 (two) times daily.   carbidopa-levodopa (SINEMET) 25-100 MG tablet Start 1/2 tablet twice daily with food x 1 week and then three times daily (Patient taking differently: 0.5 tablets 3 (three) times daily. Start 1/2 tablet twice daily with food x 1 week and then three times daily)   citalopram (CELEXA) 10 MG tablet Take 1 tablet (10 mg total) by mouth daily.   clopidogrel (PLAVIX) 75 MG tablet TAKE 1 TABLET BY MOUTH EVERY DAY   denosumab (PROLIA) 60 MG/ML SOLN injection Inject 60 mg into the skin every 6 (six) months. Administer in upper arm, thigh, or abdomen   donepezil (ARICEPT) 10 MG tablet Take 1 tablet (10 mg total) by mouth daily. (Patient taking differently: Take 5 mg by mouth daily. )   fexofenadine (ALLEGRA) 180 MG tablet Take 180 mg by mouth daily as needed for allergies or  rhinitis.   gabapentin (NEURONTIN) 100 MG capsule 100 mg. Takes as needed; patient has not taken since she started the seroquel. Takes prn if pain   isosorbide dinitrate (ISORDIL) 10 MG tablet Take 1 tablet by mouth as needed for chest pain (Patient taking differently: Take 1 tablet by mouth as needed for chest pain take only if blood pressure is 140/88)   metoprolol succinate (TOPROL-XL) 25 MG 24 hr tablet Take 0.5 tablets (12.5 mg total) by mouth daily with supper.   nitroGLYCERIN (NITROSTAT) 0.4 MG SL tablet Place 1 tablet (0.4 mg total) under the tongue every 5 (five) minutes as needed for chest pain (x 3 pills).   Omega-3 Fatty Acids (FISH OIL) 1000 MG CAPS Take by mouth.   pantoprazole (PROTONIX) 40 MG tablet Take 1 tablet (40 mg total) by mouth daily. (Patient taking differently: Take 40 mg by mouth 2 (two) times daily. )   polyethylene glycol powder (MIRALAX) 17 GM/SCOOP powder Take 1 Container by mouth once.   Polysaccharide Iron Complex (NU-IRON PO) Take by mouth.   Probiotic Product (ALIGN) 4 MG CAPS Take  4 mg by mouth daily.    rosuvastatin (CRESTOR) 20 MG tablet Take 20 mg by mouth at bedtime.   traZODone (DESYREL) 50 MG tablet TAKE 1 TABLET BY MOUTH EVERYDAY AT BEDTIME   vitamin C (ASCORBIC ACID) 250 MG tablet Take 250 mg by mouth daily.   No facility-administered encounter medications on file as of 08/06/2019.     PHYSICAL EXAM:   Limited d/t PE Deferred d/t nature of telehealth visit Well-nourished elderly female sitting up in wheelchair; alert and pleasantly conversant. Facial expressions are flat. No dyspnea with conversation.    Julianne Handler, NP

## 2019-08-09 ENCOUNTER — Other Ambulatory Visit: Payer: Self-pay

## 2019-08-09 MED ORDER — CARBIDOPA-LEVODOPA 25-100 MG PO TABS: 0.5000 | ORAL_TABLET | Freq: Four times a day (QID) | ORAL | 3 refills | Status: DC

## 2019-08-16 ENCOUNTER — Telehealth: Payer: Self-pay | Admitting: Licensed Clinical Social Worker

## 2019-08-16 NOTE — Telephone Encounter (Signed)
Palliative Care SW called patient per the request of NP, Violeta Gelinas.  SW could not leave a message because the mailbox was full.

## 2019-08-21 ENCOUNTER — Other Ambulatory Visit: Payer: Self-pay

## 2019-08-21 ENCOUNTER — Ambulatory Visit (INDEPENDENT_AMBULATORY_CARE_PROVIDER_SITE_OTHER): Payer: Medicare Other | Admitting: Neurology

## 2019-08-21 ENCOUNTER — Encounter: Payer: Self-pay | Admitting: Neurology

## 2019-08-21 VITALS — BP 144/83 | HR 57 | Temp 97.7°F

## 2019-08-21 DIAGNOSIS — G232 Striatonigral degeneration: Secondary | ICD-10-CM

## 2019-08-21 NOTE — Progress Notes (Signed)
[facility] [address] [place]. [place] [address] [phone]       OFFICE FOLLOW-UP NOTE  Molly. Truda [name] Date of Birth:  10/06/1929 Medical Record Number:  [id number]   HPI: Molly [name] is 85 year old Caucasian lady seen today for first office follow-up visit following hospital admission for stroke in December 2018. She is accompanied by her daughter. History is obtained from them as well as review of electronic medical records. I have personally reviewed imaging films. MollyHuldah Foglemanis a 85 y.o.femalewith PMH of coronary artery disease, hyperlipidemia, hypertension, atrial fibrillation on no anticoagulationpresenting to the emergency room with a last known normal of 9:15 AM on 12/07/2017 withleft-sided weakness and inability to stand.NIHSS 10.CT-scan of the brain -no acute change. Aspects 10. CT Angio of head and neck no large vessel occlusion but distal right M3 stenosis versus clot..CT perfusion no cortical infarct but tissue at risk 9 mL CT perfusion study of the brain shows a small area of penumbra in the right MCA territory with no core. IV TPA given Wed 12/07/17. Episode is likely aborted stroke with right M2 stenosis. MRI scan did not show definite right hemispheric stroke but clinically she had left hemiparalysis complete the stent with a small right brain stroke not seen on MRI likely because patient got tPA. Vascular risk factors identified included atrial fibrillation, hypertension and hyperlipidemia as well as coronary artery disease and advanced age. Plavix was added to the patient's aspirin and statin dose was increased. She was transferred to inpatient rehabilitation from where she made gradual progress and is currently living at home.   02/22/18 visit Dr. Leonie Man: She is opting improvement in left-sided strength but still needs 1 person assist to get up and walk with a walker. She walks very limited. She fatigues very easily and gets tired.  She needs a person to walk close to her. She needs help with activities like dressing herself and feeding herself and cannot be left alone. The patient is getting home physical and occupational therapy and has finished patient therapy the daughters not satisfied and feels patient still has trouble with eating and pocketing on the left side and fatigues easily when sitting. There are plans to consider outpatient TEE and loop recorder for this patient but she has a history of epidural hematoma in the spine while she was on warfarin for DVT several years ago. She seems quite frail and his fall risk and hence I do not believe she is a good long-term candidate for anticoagulation since doing further workup with a TEE and loop recorder is not a good idea in my opinion. I have discussed this with the patient's daughter Molly [name] who is in agreement with my plan  05/25/18 UPDATE: Patient is being seen today for 94-month follow-up and is accompanied by her daughter and granddaughter.  After previous visit, is recommended to continue PT/OT but as patient was unable to afford outpatient visits it was recommended to continue home care but they were unable to obtain at that time due to orders not being placed.  Daughter stated that they decided just to wait until this follow-up visit to have an order placed.  Patient's complaining of increasing memory loss over the past 1 to 2 weeks.  Denies history of any memory loss.  AFT 12, recall 3/3, clock drawing 4/4 and able to do serial additions quickly.  Patient stated that her memory waxes and wanes and at times it can be getting other times she is unable to remember certain  things.  Advised patient that this is most likely due to age-related memory loss and encouraged to do mind exercises.  Patient also has complaining of generalized weakness and deconditioning. States her day consists of sitting in her chair and watching TV as it has been difficult for her to get around. Deconditioning  most likely and would benefit from continued therapies. Continues to take Plavix with mild bruising but no bleeding.  Continues to take Crestor without side effects myalgias.  Blood pressure today 141/69 but per daughter this is monitored at home and is normally lower.  She is using Rollator for ambulation but has used this for approximately 4 years now.  Denies new or worsening stroke/TIA symptoms. Update 01/22/2019 : She is seen today urgently upon request from family.  She has had new complaints of formed visual hallucinations for the last 4 to 5 weeks.  These occur mostly at night and very rarely during the day.  She sees formed figures like bugs, snakes, insects crawling out of her mouth, ear or face.  She has as a result had very poor sleep.  At times she falls asleep and wakes up feeling scared.  As result of not sleeping well she feels quite exhausted and tired during the day.  She gets quite upset when she ceases objects.  She has not noticed any decrease in vision, her memory and cognition have also remained stable without worsening.  She is also had some depth perception issues and feels that when she is walking the floor is uneven.  She feels at times that her bathroom floor is carpeted when it is tired.  She is able to watch television and recognize faces and able to read also quite well without difficulty.  She feels overall weak all over and is currently getting home physical and occupational therapy.  She does have 24-hour care at home.  She at times gets confused and disoriented but this does not stay long.  Family is concerned about these.  She has had chronic tinnitus in the ears and hearing loss but feels of late this seems to have gotten worse and she has constant sound of her swarm of bees in her ears as well.  She has not had any recent brain imaging or lab work done in the last month.  She has not had any significant medication changes either.  There has been no recent head injury or loss of  consciousness or significant headaches or other focal neurological symptoms Update 06/12/2019 : She returns for follow-up today after last virtual follow-up visit on 04/03/2019.  Patient was started on Aricept 10 mg daily by me and appears to be tolerating it well in fact her visualized lesions appear much improved and she has not had them for the last 1 to 2 weeks.  However she has become weak all over and is unable to get up and walk even with physical therapist.  Physical therapy note states that when patient is stood up she has severe gait initiation apraxia and is unable to step requires a lot of effort to do so.  There is also tremulousness of the leg when she stands.  There is also some intermittent tremors in the hands noted as well.  The patient was seen by geriatric psychiatrist Dr. Casimiro Needle who initially tried aripiprazole but patient did not tolerate it due to nausea vomiting and it was stopped.  He recommended trying Nuplazid for psychosis and hallucinations but patient's insurance did not cover it and  she could not afford the cost of  $550 a month and hence it was not tried.  I recommended Risperdal but patient did not try that either.  She is complaining of difficulty sleeping at night now.  Previously she was sleeping much better on Seroquel but this has been discontinued as well.  The patient is also complaining of some weakness in the right hand as well as right side.  She had an MRI scan of the brain done in February which had shown mild generalized atrophy but no infarct.  She does have some chronic right leg weakness from previous degenerative back disease. Update 08/21/19 : She returns for follow-up after last visit 2 months ago.  She is accompanied by her daughter.  They have noticed definite benefit after starting Sinemet.  She was initially taking half a tablet 3 times daily but noticed early morning stiffness and gait initiation difficulties which appear to have improved after changing the  Sinemet dose to 4 times daily.  She is able to walk better now and able to assist with getting out of bed and transfers.  She walks with a walker with a close supervision.  She has had no falls or injuries.  She is also noted improvement in her tremors as well as feet freezing.  She is able to sleep better.  She still has occasional visual hallucinations but these are not as disabling and she feels she can realize that they are not real.  She was on Aricept 10 mg but the dose was reduced to 5 mg as it was noticed that made her quite anxious and she is not sleeping well which appears to have improved.  She does have 24-hour care at home.  She did undergo lab work after last visit on 06/12/2019 and complete metabolic panel labs and CBC were unremarkable.  I had ordered an MRI scan of the brain but for unclear reason that has not been done.  She has no new complaints today. ROS:   14 system review of systems is positive for weakness, memory loss,   fatigue, activity change, leg swelling, loss of vision, leg tremors, restless leg, insomnia, vision hallucinations, muscle cramps, walking difficulty,   and all other systems negative  PMH:  Past Medical History:  Diagnosis Date  . Acid reflux   . Anemia   . Arthritis   . CAD (coronary artery disease)   . High cholesterol   . Hyperlipidemia   . Hypertension   . Osteoporosis   . Spinal stenosis   . Stroke (cerebrum) ([place]) 12/07/2017    Social History:  Social History   Socioeconomic History  . Marital status: Married    Spouse name: Not on file  . Number of children: Not on file  . Years of education: Not on file  . Highest education level: Not on file  Occupational History  . Not on file  Social Needs  . Financial resource strain: Not on file  . Food insecurity    Worry: Not on file    Inability: Not on file  . Transportation needs    Medical: Not on file    Non-medical: Not on file  Tobacco Use  . Smoking status: Never Smoker  .  Smokeless tobacco: Never Used  Substance and Sexual Activity  . Alcohol use: No    Alcohol/week: 0.0 standard drinks  . Drug use: No  . Sexual activity: Not Currently  Lifestyle  . Physical activity    Days per  week: Not on file    Minutes per session: Not on file  . Stress: Not on file  Relationships  . Social Herbalist on phone: Not on file    Gets together: Not on file    Attends religious service: Not on file    Active member of club or organization: Not on file    Attends meetings of clubs or organizations: Not on file    Relationship status: Not on file  . Intimate partner violence    Fear of current or ex partner: Not on file    Emotionally abused: Not on file    Physically abused: Not on file    Forced sexual activity: Not on file  Other Topics Concern  . Not on file  Social History Narrative  . Not on file    Medications:   Current Outpatient Medications on File Prior to Visit  Medication Sig Dispense Refill  . acetaminophen (TYLENOL) 500 MG tablet Take 500 mg by mouth every 8 (eight) hours as needed for mild pain.     . [place]wellia-Glucosamine-Vit D (OSTEO BI-FLEX ONE PER DAY PO) Take 2,000 Units by mouth 2 (two) times daily.    . carbidopa-levodopa (SINEMET) 25-100 MG tablet Take 0.5 tablets by mouth 4 (four) times daily. Start 1/2 tablet twice daily with food x 1 week and then three times daily 60 tablet 3  . citalopram (CELEXA) 10 MG tablet Take 1 tablet (10 mg total) by mouth daily. 30 tablet 2  . clopidogrel (PLAVIX) 75 MG tablet TAKE 1 TABLET BY MOUTH EVERY DAY 90 tablet 0  . denosumab (PROLIA) 60 MG/ML SOLN injection Inject 60 mg into the skin every 6 (six) months. Administer in upper arm, thigh, or abdomen    . donepezil (ARICEPT) 5 MG tablet     . metoprolol succinate (TOPROL-XL) 25 MG 24 hr tablet Take 0.5 tablets (12.5 mg total) by mouth daily with supper. 30 tablet 0  . nitroGLYCERIN (NITROSTAT) 0.4 MG SL tablet Place 1 tablet (0.4 mg total)  under the tongue every 5 (five) minutes as needed for chest pain (x 3 pills). 25 tablet 3  . pantoprazole (PROTONIX) 40 MG tablet Take 1 tablet (40 mg total) by mouth daily. (Patient taking differently: Take 40 mg by mouth 2 (two) times daily. ) 30 tablet 0  . polyethylene glycol powder (MIRALAX) 17 GM/SCOOP powder Take 1 Container by mouth once.    . Polysaccharide Iron Complex (NU-IRON PO) Take by mouth.    . Probiotic Product (ALIGN) 4 MG CAPS Take 4 mg by mouth daily.     . rosuvastatin (CRESTOR) 20 MG tablet Take 20 mg by mouth at bedtime.    . traZODone (DESYREL) 50 MG tablet TAKE 1 TABLET BY MOUTH EVERYDAY AT BEDTIME 30 tablet 1  . calcium-vitamin D (OSCAL WITH D) 500-200 MG-UNIT tablet Take 1 tablet by mouth 2 (two) times daily. (Patient not taking: Reported on 08/21/2019) 120 tablet 0   No current facility-administered medications on file prior to visit.     Allergies:   Allergies  Allergen Reactions  . Buprenorphine Hcl Shortness Of Breath    Chest pain  . Codeine Nausea And Vomiting    ulcers  . Morphine And Related Shortness Of Breath    Chest pain, made me crazy  . Sulfacetamide Sodium Nausea And Vomiting  . Vicodin [Hydrocodone-Acetaminophen] Nausea And Vomiting  . Evista [Raloxifene] Other (See Comments) and Nausea And Vomiting    Upset  stomach  . Hydromorphone Nausea And Vomiting    Sensitive  . Other Other (See Comments)    Narcotic-like medications - does NOT tolerate pain meds, causes unrelating volatile vomiting and drops in BP below 80/40 with dizziness and fainting  Tourniquet - Please place tourniquet over cloth - causes sever bruising.  Blood pressure cuff - please use manual blood pressure cuff - causes excoriating pain using machine and bruising     . Sulfur Nausea And Vomiting    Vomited until bleeding occurred and developed an active stomach ulcer  . Tape Other (See Comments)    Rips skin    Physical Exam General: cachectic frail elderly Caucasian  lady, seated, in no evident distress Head: head normocephalic and atraumatic.  Head and neck deviated to the right Neck: supple with no carotid or supraclavicular bruits Cardiovascular: regular rate and rhythm, no murmurs Musculoskeletal: no deformity except marked kyphosis and slight scoliosis to the right Skin:  no rash/petichiae Vascular:  Normal pulses all extremities ut 2+ pedal edema bilaterally Vitals:   08/21/19 1414  BP: (!) 144/83  Pulse: (!) 57  Temp: 97.7 F (36.5 C)   Neurologic Exam Mental Status: Awake and fully alert. Oriented to place and time. Recent and remote memory diminished. Attention span, concentration and fund of knowledge diminished. Mood and affect appropriate.     Recall 3/3.  Clock drawing 3/4.MMSE  Not done  She got only 7 animals which walk on 4 legs.  Clock drawing she scored 3/4. Cranial Nerves: Fundoscopic exam not done. Pupils equal, briskly reactive to light. Extraocular movements full without nystagmus. Visual fields full to confrontation and able to read and write quite well. Hearing diminished bilaterally. Facial sensation intact.mild left lower facial asymmetry., tongue, palate moves normally and symmetrically.  Motor: Generalized weakness noted in all extremities (most likely related to deconditioning) but greater on the right with right foot drop and right grip and hip flexor weakness Sensory.: intact to touch ,pinprick .position and vibratory sensation.  Coordination: Slow but slightly impaired on the right compared to the left  Gait and Station: Unable to get up from the chair by herself and requires two-person assist. stance is stooped and leaning to the right.. Mild gait initiation apraxia and  able to walk with short steps with stooped posture reflexes: 2+ and asymmetric and brisker on the right. Toes downgoing.    ASSESSMENT: 85 year old patient with right MCA branch infarct in December 2018 of cryptogenic etiology with residual mild left  hemiparesis.  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  PLAN: I had a long discussion with the patient and her daughter about her gait difficulties   and new onset of tremors concerns for Parkinson's plus syndrome who has shown modest response to trial of low-dose Sinemet.    Continue Aricept 5mg  daily as her visual hallucinations appear to have improved and she appears cognitively   quite well preserved.   .continue home physical occupational therapy.  Marland Kitchen  She was asked to continue follow-up with Dr. Casimiro Needle geriatric psychiatrist as well.  She will continue home physical occupational therapy for gait and balance.  She will return for follow-up in 3 months with my nurse practitioner Janett Billow or call earlier if necessary.Greater than 50% of time during this 25-minute minute visit was spent on counseling,explanation of diagnosis of peduncular hallucinations, parkinson`s plus, gait apraxia  and stroke, reviewing risk factor management of HLD and HTN, planning of further management, discussion with patient and family and coordination of care  Antony Contras, MD  [facility] [address] [place], [place] [address]  Phone [phone] Fax [phone]

## 2019-08-21 NOTE — Patient Instructions (Signed)
I had a long discussion with the patient and her daughter about her gait difficulties   and new onset of tremors concerns for Parkinson's plus syndrome who has shown modest response to trial of low-dose Sinemet.    Continue Aricept 5mg  daily as her visual hallucinations appear to have improved and she appears cognitively   quite well preserved.   .continue home physical occupational therapy.  Marland Kitchen  She was asked to continue follow-up with Dr. Casimiro Needle geriatric psychiatrist as well.  She will continue home physical occupational therapy for gait and balance.  She will return for follow-up in 3 months with my nurse practitioner Janett Billow or call earlier if necessary.

## 2019-08-29 ENCOUNTER — Telehealth: Payer: Self-pay

## 2019-08-29 ENCOUNTER — Other Ambulatory Visit: Payer: Self-pay

## 2019-08-29 MED ORDER — CARBIDOPA-LEVODOPA 25-100 MG PO TABS: 0.5000 | ORAL_TABLET | Freq: Four times a day (QID) | ORAL | 3 refills | Status: DC

## 2019-08-29 NOTE — Telephone Encounter (Signed)
Rn receive a fax that pt wants carbidpa-levodopa sent to [facility]. I stated all remaninig refills need to be cancel. They stated pt just put a 30 day refill today. I stated the ongoing refills will be done by [facility].

## 2019-09-05 ENCOUNTER — Ambulatory Visit (INDEPENDENT_AMBULATORY_CARE_PROVIDER_SITE_OTHER): Payer: Medicare Other | Admitting: Psychiatry

## 2019-09-05 ENCOUNTER — Other Ambulatory Visit: Payer: Self-pay

## 2019-09-05 DIAGNOSIS — F01518 Vascular dementia, unspecified severity, with other behavioral disturbance: Secondary | ICD-10-CM

## 2019-09-05 DIAGNOSIS — F0151 Vascular dementia with behavioral disturbance: Secondary | ICD-10-CM | POA: Diagnosis not present

## 2019-09-05 MED ORDER — CITALOPRAM HYDROBROMIDE 10 MG PO TABS: 10.0000 mg | ORAL_TABLET | Freq: Every day | ORAL | 2 refills | Status: DC

## 2019-09-05 MED ORDER — CARBIDOPA-LEVODOPA 25-100 MG PO TABS: 0.5000 | ORAL_TABLET | Freq: Four times a day (QID) | ORAL | 1 refills | Status: DC

## 2019-09-05 NOTE — Progress Notes (Signed)
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  Her visual hallucinations have disappeared.  The changes that have occurred since I first met her was that her Seroquel has been discontinued, her neurologist has begun her on Sinemet and I at some point began her on Celexa 10 mg.  The patient is functioning very well.  She is sleeping and eating well.  The visual hallucinations are simply gone.  The patient has no signs of psychosis.  She is functioning very well.  Her mood is great.  She was interviewed together with her daughter Molly [name] today.  They are very pleased with the outcome.  She shows no other signs of psychosis.  The patient drinks no alcohol uses no drugs.  She showed no new neurological symptoms.  Associated Signs/Symptoms: Depression Symptoms:   (Hypo) Manic Symptoms:   Anxiety Symptoms:   Psychotic Symptoms:  Hallucinations: Visual PTSD Symptoms:   Past Psychiatric History: Seroquel 100 mg  Previous Psychotropic Medications:   Substance Abuse History in the last 12 months:    Consequences of Substance Abuse:   Past Medical History:  Past Medical History:  Diagnosis Date  . Acid reflux   . Anemia   . Arthritis   . CAD (coronary artery disease)   . High cholesterol   . Hyperlipidemia   . Hypertension   . Osteoporosis   . Spinal stenosis   . Stroke (cerebrum) ([place]) 12/07/2017    Past Surgical History:  Procedure Laterality Date  . ABDOMINAL HYSTERECTOMY    . APPENDECTOMY    . BACK SURGERY     Rod and Pins Placed   . CARDIAC CATHETERIZATION N/A 11/17/2015   Procedure: Left Heart Cath and Coronary Angiography;  Surgeon: Jettie Booze, MD;  Location: [facility];  Service: Cardiovascular;  Laterality: N/A;  . CAROTID STENT    . UPPER GI ENDOSCOPY      Family Psychiatric History:   Family History:  Family History  Problem Relation Age of Onset  . Anemia Mother   . Heart attack Brother     Social History:   Social History   Socioeconomic History  . Marital status: Married    Spouse name: Not on file  . Number of children: Not on file  . Years of education: Not on file  . Highest education level: Not on file  Occupational History  . Not on file  Social Needs  . Financial resource strain: Not on file  . Food insecurity    Worry: Not on file    Inability: Not on file  . Transportation needs    Medical: Not on file    Non-medical: Not on file  Tobacco Use  . Smoking status: Never Smoker  . Smokeless tobacco: Never Used  Substance and Sexual Activity  . Alcohol use: No    Alcohol/week: 0.0 standard drinks  . Drug use: No  . Sexual activity: Not Currently  Lifestyle  . Physical activity    Days per week: Not on file    Minutes per session: Not on file  . Stress: Not on file  Relationships  . Social Herbalist on phone: Not on file    Gets together:  Not on file    Attends religious service: Not on file    Active member of club or organization: Not on file    Attends meetings of clubs or organizations: Not on file    Relationship status: Not on file  Other Topics Concern  . Not on file  Social History Narrative  . Not on file    Additional Social History:   Allergies:   Allergies  Allergen Reactions  . Buprenorphine Hcl Shortness Of Breath    Chest pain  . Codeine Nausea And Vomiting    ulcers  . Morphine And Related Shortness Of Breath    Chest pain, made me crazy  . Sulfacetamide Sodium Nausea And Vomiting  . Vicodin [Hydrocodone-Acetaminophen] Nausea And Vomiting  . Evista [Raloxifene] Other (See Comments) and Nausea And Vomiting    Upset stomach  . Hydromorphone Nausea And  Vomiting    Sensitive  . Other Other (See Comments)    Narcotic-like medications - does NOT tolerate pain meds, causes unrelating volatile vomiting and drops in BP below 80/40 with dizziness and fainting  Tourniquet - Please place tourniquet over cloth - causes sever bruising.  Blood pressure cuff - please use manual blood pressure cuff - causes excoriating pain using machine and bruising     . Sulfur Nausea And Vomiting    Vomited until bleeding occurred and developed an active stomach ulcer  . Tape Other (See Comments)    Rips skin    Metabolic Disorder Labs: Lab Results  Component Value Date   HGBA1C 5.7 (H) 12/08/2017   MPG 116.89 12/08/2017   No results found for: PROLACTIN Lab Results  Component Value Date   CHOL 133 12/08/2017   TRIG 98 12/08/2017   HDL 63 12/08/2017   CHOLHDL 2.1 12/08/2017   VLDL 20 12/08/2017   LDLCALC 50 12/08/2017   LDLCALC 87 11/18/2014   Lab Results  Component Value Date   TSH 3.010 01/23/2019    Therapeutic Level Labs: No results found for: LITHIUM No results found for: CBMZ No results found for: VALPROATE  Current Medications: Current Outpatient Medications  Medication Sig Dispense Refill  . acetaminophen (TYLENOL) 500 MG tablet Take 500 mg by mouth every 8 (eight) hours as needed for mild pain.     . [place]-Glucosamine-Vit D (OSTEO BI-FLEX ONE PER DAY PO) Take 2,000 Units by mouth 2 (two) times daily.    . calcium-vitamin D (OSCAL WITH D) 500-200 MG-UNIT tablet Take 1 tablet by mouth 2 (two) times daily. (Patient not taking: Reported on 08/21/2019) 120 tablet 0  . carbidopa-levodopa (SINEMET) 25-100 MG tablet Take 0.5 tablets by mouth 4 (four) times daily. 360 tablet 1  . citalopram (CELEXA) 10 MG tablet Take 1 tablet (10 mg total) by mouth daily. 90 tablet 2  . clopidogrel (PLAVIX) 75 MG tablet TAKE 1 TABLET BY MOUTH EVERY DAY 90 tablet 0  . denosumab (PROLIA) 60 MG/ML SOLN injection Inject 60 mg into the skin every 6 (six) months.  Administer in upper arm, thigh, or abdomen    . donepezil (ARICEPT) 5 MG tablet     . metoprolol succinate (TOPROL-XL) 25 MG 24 hr tablet Take 0.5 tablets (12.5 mg total) by mouth daily with supper. 30 tablet 0  . nitroGLYCERIN (NITROSTAT) 0.4 MG SL tablet Place 1 tablet (0.4 mg total) under the tongue every 5 (five) minutes as needed for chest pain (x 3 pills). 25 tablet 3  . pantoprazole (PROTONIX) 40 MG tablet Take 1 tablet (40  mg total) by mouth daily. (Patient taking differently: Take 40 mg by mouth 2 (two) times daily. ) 30 tablet 0  . polyethylene glycol powder (MIRALAX) 17 GM/SCOOP powder Take 1 Container by mouth once.    . Polysaccharide Iron Complex (NU-IRON PO) Take by mouth.    . Probiotic Product (ALIGN) 4 MG CAPS Take 4 mg by mouth daily.     . rosuvastatin (CRESTOR) 20 MG tablet Take 20 mg by mouth at bedtime.    . traZODone (DESYREL) 50 MG tablet TAKE 1 TABLET BY MOUTH EVERYDAY AT BEDTIME 30 tablet 1   No current facility-administered medications for this visit.     Musculoskeletal: Strength & Muscle Tone: decreased Gait & Station: unable to stand Patient leans: N/A  Psychiatric Specialty Exam: ROS  There were no vitals taken for this visit.There is no height or weight on file to calculate BMI.  General Appearance: Casual  Eye Contact:  Good  Speech:  Normal Rate  Volume:  Normal  Mood:  Euthymic  Affect:  Appropriate  Thought Process:  Goal Directed  Orientation:  NA  Thought Content:  Hallucinations: Visual  Suicidal Thoughts:  No  Homicidal Thoughts:  No  Memory:    Judgement:  Good  Insight:  Fair  Psychomotor Activity:  Decreased  Concentration:    Recall:  [place] of Knowledge:Fair  Language: Poor  Akathisia:  No  Handed:  Right  AIMS (if indicated):  not done  Assets:  Desire for Improvement  ADL's:  Impaired  Cognition: WNL  Sleep:  Fair   Screenings: Mini-Mental     Office Visit from 06/12/2019 in [facility]  Total  Score (max 30 points )  24    PHQ2-9     [facility]  PHQ-2 Total Score  1  PHQ-9 Total Score  2      Assessment and Plan:   At this time the patient is #1 problem seems to be hallucinations related to a CVA.  In retrospect though I suspect there is some degree of depression.  Nonetheless the good news is the patient is off the neuroleptic Seroquel and takes Sinemet for her Parkinson's and Celexa will call clinical depression.  Another way to describe this patient's condition is clinical depression with psychosis but I truly believe this is related to her CVA.  At this time the patient is doing very well.  She will return to see me hopefully in person in 5 months.   Jerral Ralph, MD 9/23/20204:40 PM

## 2019-09-10 ENCOUNTER — Other Ambulatory Visit: Payer: Self-pay

## 2019-09-10 ENCOUNTER — Encounter: Payer: Self-pay | Admitting: Internal Medicine

## 2019-09-10 ENCOUNTER — Other Ambulatory Visit: Payer: Medicare Other | Admitting: Internal Medicine

## 2019-09-10 DIAGNOSIS — Z515 Encounter for palliative care: Secondary | ICD-10-CM

## 2019-09-10 NOTE — Progress Notes (Signed)
Aug 24th, 2020 [facility] Palliative Care Consult Note Telephone: [phone]  Fax: [phone]   Due to the current COVID-19 infection/crises, the family prefer, and have given their verbal consent for, a provider visit via telemedicine. HIPPA policies of confidentially were discussed. Video-audio (telehealth) contact was unable to be done due technical barriers from the patient's side.   PATIENT NAME: Molly [name] DOB: 1929/04/21 MRN: [id number]   PRIMARY CARE PROVIDER:   [name], MD [place] [address] [place], [place] [address]   REFERRING PROVIDER: Dr. Antony Contras (Neurology).  Dr. Norma Fredrickson (Psych) Dr. Larae Grooms (Cardiology) Dr. Antony Contras (Neurology)  RESPONSIBLE PARTY: *(dtr) Allene Dillon [phone] e-mail: [email or web address]. (dtr) Ms Ishmael Holter, e-mail:  [email or web address]    ASSESSMENT/RECOMMENDATIONS: 1. Advance Care Planning: A. Advanced Care Directives: DNR/MOST form in [place] and in home. B. Goals of Care: Maintain strength and function. Optimize mobility as best she can.   2. Functional status: Just completed course of PT/OT. Patient feels has increased in strength. Able to self-transfer with use of lift chair and occasionally needs assist to stand from chair. Recent OV with Dr. [address], who decreased dose of Sinemet by , but increased frequency to qid (from tid). He is weaning Aricept off; currently in 2.5mg  qd and will stop after her pill supple finished. She had only a few transient visual hallucinations lasting only a few min (something crawling on the wall) which were not disturbing to her as she recognized it was not real. Family and patient both feel improvement in her spirits on increased Celexa (Dr. Leonie Man).    3. Family supports (from previous note): Casimiro Needle is PCG. She has physical ailments that can make physical care of patient taxing.               4. F/U: Patient feels she is doing  well; daughter [name] will call [name] on a prn basis   I spent 30 minutes providing this consultation, from 2 pm to 2:30 pm. More than 50% of the time in this consultation was spent coordinating communication.    HISTORY OF PRESENT ILLNESS:  Molly [name] is an 85 y.o. female with medical h/o CAD, HLD, HTN, auditory/tactile hallucinations (improved) and osteoporosis. This is a f/u Palliative Care visit from 07/09/2019.   CODE STATUS: DNR. MOST: DNR/DNI (completed 04/13/2019). Scope of Medical Interventions: Comfort measures. Antibiotics: Determine 18/12/2018 or limitations at time of infection. No to IVFs, no tube feedings.    PPS:  40%    HOSPICE ELIGIBILITY/DIAGNOSIS: TBD   PAST MEDICAL HISTORY:  Past Medical History:  Diagnosis Date  . Acid reflux   . Anemia   . Arthritis   . CAD (coronary artery disease)   . High cholesterol   . Hyperlipidemia   . Hypertension   . Osteoporosis   . Spinal stenosis   . Stroke (cerebrum) ([place]) 12/07/2017    SOCIAL HX:  Social History   Tobacco Use  . Smoking status: Never Smoker  . Smokeless tobacco: Never Used  Substance Use Topics  . Alcohol use: No    Alcohol/week: 0.0 standard drinks    ALLERGIES:  Allergies  Allergen Reactions  . Buprenorphine Hcl Shortness Of Breath    Chest pain  . Codeine Nausea And Vomiting    ulcers  . Morphine And Related Shortness Of Breath    Chest pain, made me crazy  . Sulfacetamide Sodium Nausea And Vomiting  . Vicodin [Hydrocodone-Acetaminophen] Nausea And Vomiting  . Evista [  Raloxifene] Other (See Comments) and Nausea And Vomiting    Upset stomach  . Hydromorphone Nausea And Vomiting    Sensitive  . Other Other (See Comments)    Narcotic-like medications - does NOT tolerate pain meds, causes unrelating volatile vomiting and drops in BP below 80/40 with dizziness and fainting  Tourniquet - Please place tourniquet over cloth - causes sever bruising.  Blood pressure cuff - please use manual blood  pressure cuff - causes excoriating pain using machine and bruising     . Sulfur Nausea And Vomiting    Vomited until bleeding occurred and developed an active stomach ulcer  . Tape Other (See Comments)    Rips skin     PERTINENT MEDICATIONS:  Outpatient Encounter Medications as of 09/10/2019  Medication Sig  . acetaminophen (TYLENOL) 500 MG tablet Take 500 mg by mouth every 8 (eight) hours as needed for mild pain.   . [place]well[place]-Glucosamine-Vit D (OSTEO BI-FLEX ONE PER DAY PO) Take 2,000 Units by mouth 2 (two) times daily.  . calcium-vitamin D (OSCAL WITH D) 500-200 MG-UNIT tablet Take 1 tablet by mouth 2 (two) times daily. (Patient not taking: Reported on 08/21/2019)  . carbidopa-levodopa (SINEMET) 25-100 MG tablet Take 0.5 tablets by mouth 4 (four) times daily.  . citalopram (CELEXA) 10 MG tablet Take 1 tablet (10 mg total) by mouth daily.  . clopidogrel (PLAVIX) 75 MG tablet TAKE 1 TABLET BY MOUTH EVERY DAY  . denosumab (PROLIA) 60 MG/ML SOLN injection Inject 60 mg into the skin every 6 (six) months. Administer in upper arm, thigh, or abdomen  . donepezil (ARICEPT) 5 MG tablet   . metoprolol succinate (TOPROL-XL) 25 MG 24 hr tablet Take 0.5 tablets (12.5 mg total) by mouth daily with supper.  . nitroGLYCERIN (NITROSTAT) 0.4 MG SL tablet Place 1 tablet (0.4 mg total) under the tongue every 5 (five) minutes as needed for chest pain (x 3 pills).  . pantoprazole (PROTONIX) 40 MG tablet Take 1 tablet (40 mg total) by mouth daily. (Patient taking differently: Take 40 mg by mouth 2 (two) times daily. )  . polyethylene glycol powder (MIRALAX) 17 GM/SCOOP powder Take 1 Container by mouth once.  . Polysaccharide Iron Complex (NU-IRON PO) Take by mouth.  . Probiotic Product (ALIGN) 4 MG CAPS Take 4 mg by mouth daily.   . rosuvastatin (CRESTOR) 20 MG tablet Take 20 mg by mouth at bedtime.  . traZODone (DESYREL) 50 MG tablet TAKE 1 TABLET BY MOUTH EVERYDAY AT BEDTIME   No facility-administered encounter  medications on file as of 09/10/2019.     PHYSICAL EXAM:   PE deferred d/t audio only nature of telehealth visit (patient unable to connect with [facility] today).  Julianne Handler, NP

## 2019-09-12 ENCOUNTER — Other Ambulatory Visit (HOSPITAL_COMMUNITY): Payer: Self-pay | Admitting: Psychiatry

## 2019-09-25 ENCOUNTER — Other Ambulatory Visit (HOSPITAL_COMMUNITY): Payer: Self-pay | Admitting: Psychiatry

## 2019-09-25 ENCOUNTER — Other Ambulatory Visit: Payer: Self-pay

## 2019-09-25 MED ORDER — TRAZODONE HCL 50 MG PO TABS: ORAL_TABLET | ORAL | 1 refills | Status: DC

## 2019-09-26 ENCOUNTER — Other Ambulatory Visit (HOSPITAL_COMMUNITY): Payer: Self-pay

## 2019-09-26 MED ORDER — CITALOPRAM HYDROBROMIDE 10 MG PO TABS: 10.0000 mg | ORAL_TABLET | Freq: Every day | ORAL | 0 refills | Status: DC

## 2019-09-26 MED ORDER — CITALOPRAM HYDROBROMIDE 10 MG PO TABS: 10.0000 mg | ORAL_TABLET | Freq: Every day | ORAL | 2 refills | Status: DC

## 2019-09-27 ENCOUNTER — Other Ambulatory Visit: Payer: Self-pay | Admitting: Neurology

## 2019-10-17 ENCOUNTER — Telehealth (HOSPITAL_COMMUNITY): Payer: Self-pay

## 2019-10-17 NOTE — Telephone Encounter (Signed)
Patients daughter is calling, she states that patient is more nervous and tense. She is of the belief that it is the Celexa and is requesting a call back.

## 2019-10-19 MED ORDER — HYDROXYZINE HCL 10 MG PO TABS: 10.0000 mg | ORAL_TABLET | Freq: Two times a day (BID) | ORAL | 2 refills | Status: DC

## 2019-10-19 NOTE — Addendum Note (Signed)
Addended by: Dennie Maizes E on: 10/19/2019 12:52 PM   Modules accepted: Orders

## 2019-10-19 NOTE — Telephone Encounter (Signed)
Medication sent to the pharmacy, I called patients daughter and let her know

## 2019-10-25 ENCOUNTER — Encounter: Payer: Self-pay | Admitting: Internal Medicine

## 2019-10-25 ENCOUNTER — Other Ambulatory Visit: Payer: Medicare Other | Admitting: Internal Medicine

## 2019-10-25 ENCOUNTER — Other Ambulatory Visit: Payer: Self-pay

## 2019-10-25 DIAGNOSIS — Z515 Encounter for palliative care: Secondary | ICD-10-CM

## 2019-10-25 DIAGNOSIS — T50905A Adverse effect of unspecified drugs, medicaments and biological substances, initial encounter: Secondary | ICD-10-CM

## 2019-10-25 NOTE — Progress Notes (Signed)
Nov 12th, 2020 [facility] Palliative Care Consult Note Telephone: [phone]  Fax: [phone]  Due to the current COVID-19 infection/crises, the family prefer, and have given their verbal consent for, a provider visit via telemedicine. HIPPA policies of confidentially were discussed. Video-audio (telehealth) contact was unable to be done due technical barriers from the patient's side.  PATIENT NAME:Molly [name] DOB:10-Nov-1929 [id number]  PRIMARY CARE PROVIDER:[place], Margaretha [place], [place] [address] [place], [place] [address]  REFERRING PROVIDER:Dr. Antony Contras (Neurology).  Dr. Norma Fredrickson (Psych) Dr. Larae Grooms (Cardiology) Dr. Antony Contras (Neurology)  RESPONSIBLE PARTY:*(dtr) Allene Dillon [phone] e-mail: [email or web address]. (dtr) Ms Ishmael Holter, e-mail: [email or web address]  1. Advance Care Planning: A. Advanced Care Directives: DNR/MOST form in [place] and in home. B. Goals of Care: Maintain strength and function. Optimize mobility as best she can.  2. Symptom management:  A. Hallucinations: Patient was having very rare episodes of visual hallucinations. Her Aricept had been weaned to off, and her Restoril discontinued. Within the last week she developed symptoms of LE "tightening", for which Dr. Casimiro Needle prescribed Vistaril. Unfortunately, patient had side effects of confusion and hallucinations for the 3 days she was on the Vistaril. Daughter Molly [name] discontinued this med and restarted her mom on the Aricept 2.5mg  qd and restarted her Restoril at hs. Symptoms of confusion resolved, and hallucinations now infrequent. Not being bothered by muscle tightening at this time.   3. Functional status:  Now able to bowl with her We. Can self-transfer with use of lift chair and occasionally needs assist to stand from chair. Maintaining good spirits on Celexa. Her appetite is much improved, and she has good oral  intake. Her weight is approximately 149 lbs. At 5'1" her BMI is 28.2 kg/m2.   4. Family supports: Daughter Molly [name] is PCG. She has physical ailments that can make physical care of patient taxing.  Molly [name] has hired 3 part time aides to assist with her mom's care, and 2 of patient's other daughters have been able to arrange some small amounts of time off work, to help assist with adult care.               5. F/U: Tuesday Jan 12th at 2pm   I spent 30 minutes providing this consultation, from 2 pm to 2:30 pm. More than 50% of the time in this consultation was spent coordinating communication.    HISTORY OF PRESENT ILLNESS:  Molly [name] is an 85 y.o. female with medical h/o CAD, HLD, HTN, auditory/tactile hallucinations (improved) and osteoporosis. This is a f/u Palliative Care visit from 08/06/2019.   CODE STATUS: DNR. MOST: DNR/DNI (completed 04/13/2019). Scope of Medical Interventions: Comfort measures. Antibiotics: Determine [place] or limitations at time of infection. No to IVFs, no tube feedings.    PPS:  40%    HOSPICE ELIGIBILITY/DIAGNOSIS: TBD   PAST MEDICAL HISTORY:  Past Medical History:  Diagnosis Date  . Acid reflux   . Anemia   . Arthritis   . CAD (coronary artery disease)   . High cholesterol   . Hyperlipidemia   . Hypertension   . Osteoporosis   . Spinal stenosis   . Stroke (cerebrum) ([place]) 12/07/2017    SOCIAL HX:  Social History   Tobacco Use  . Smoking status: Never Smoker  . Smokeless tobacco: Never Used  Substance Use Topics  . Alcohol use: No    Alcohol/week: 0.0 standard drinks    ALLERGIES:  Allergies  Allergen Reactions  . Buprenorphine  Hcl Shortness Of Breath    Chest pain  . Codeine Nausea And Vomiting    ulcers  . Morphine And Related Shortness Of Breath    Chest pain, made me crazy  . Sulfacetamide Sodium Nausea And Vomiting  . Vicodin [Hydrocodone-Acetaminophen] Nausea And Vomiting  . Evista [Raloxifene] Other (See Comments) and Nausea And  Vomiting    Upset stomach  . Hydromorphone Nausea And Vomiting    Sensitive  . Other Other (See Comments)    Narcotic-like medications - does NOT tolerate pain meds, causes unrelating volatile vomiting and drops in BP below 80/40 with dizziness and fainting  Tourniquet - Please place tourniquet over cloth - causes sever bruising.  Blood pressure cuff - please use manual blood pressure cuff - causes excoriating pain using machine and bruising     . Sulfur Nausea And Vomiting    Vomited until bleeding occurred and developed an active stomach ulcer  . Tape Other (See Comments)    Rips skin  . Vistaril [Hydroxyzine Hcl] Other (See Comments)    Confusion, hallucinations     PERTINENT MEDICATIONS:  Outpatient Encounter Medications as of 10/25/2019  Medication Sig  . acetaminophen (TYLENOL) 500 MG tablet Take 500 mg by mouth every 8 (eight) hours as needed for mild pain.   . Boswell[place]-Glucosamine-Vit D (OSTEO BI-FLEX ONE PER DAY PO) Take 2,000 Units by mouth 2 (two) times daily.  . calcium-vitamin D (OSCAL WITH D) 500-200 MG-UNIT tablet Take 1 tablet by mouth 2 (two) times daily. (Patient not taking: Reported on 08/21/2019)  . carbidopa-levodopa (SINEMET) 25-100 MG tablet Take 0.5 tablets by mouth 4 (four) times daily.  . citalopram (CELEXA) 10 MG tablet Take 1 tablet (10 mg total) by mouth daily.  . clopidogrel (PLAVIX) 75 MG tablet TAKE 1 TABLET BY MOUTH EVERY DAY  . denosumab (PROLIA) 60 MG/ML SOLN injection Inject 60 mg into the skin every 6 (six) months. Administer in upper arm, thigh, or abdomen  . donepezil (ARICEPT) 5 MG tablet 2.5 mg.   . hydrOXYzine (ATARAX/VISTARIL) 10 MG tablet Take 1 tablet (10 mg total) by mouth 2 (two) times daily.  . metoprolol succinate (TOPROL-XL) 25 MG 24 hr tablet Take 0.5 tablets (12.5 mg total) by mouth daily with supper.  . nitroGLYCERIN (NITROSTAT) 0.4 MG SL tablet Place 1 tablet (0.4 mg total) under the tongue every 5 (five) minutes as needed for chest  pain (x 3 pills).  . pantoprazole (PROTONIX) 40 MG tablet Take 1 tablet (40 mg total) by mouth daily. (Patient taking differently: Take 40 mg by mouth 2 (two) times daily. )  . polyethylene glycol powder (MIRALAX) 17 GM/SCOOP powder Take 1 Container by mouth once.  . Polysaccharide Iron Complex (NU-IRON PO) Take by mouth.  . Probiotic Product (ALIGN) 4 MG CAPS Take 4 mg by mouth daily.   . rosuvastatin (CRESTOR) 20 MG tablet Take 20 mg by mouth at bedtime.  . traZODone (DESYREL) 50 MG tablet TAKE 1 TABLET BY MOUTH EVERYDAY AT BEDTIME (Patient taking differently: 25 mg. TAKE 1/2 TABLET BY MOUTH EVERYDAY AT BEDTIME)   No facility-administered encounter medications on file as of 10/25/2019.     PHYSICAL EXAM:   PE deferred d/t audio only nature of telehealth visit   Julianne Handler, NP

## 2019-11-06 ENCOUNTER — Other Ambulatory Visit: Payer: Medicare Other | Admitting: Internal Medicine

## 2019-11-06 ENCOUNTER — Other Ambulatory Visit: Payer: Self-pay

## 2019-11-06 DIAGNOSIS — G20 Parkinson's disease: Secondary | ICD-10-CM

## 2019-11-06 DIAGNOSIS — Z515 Encounter for palliative care: Secondary | ICD-10-CM

## 2019-11-07 NOTE — Progress Notes (Signed)
Nov 24th, 2020 [facility] Palliative Care Consult Note Telephone: [phone]  Fax: [phone]   Due to the current COVID-19 infection/crises, the family prefer, and have given their verbal consent for, a provider visit via telemedicine. HIPPA policies of confidentially were discussed. Video-audio (telehealth) contact was unable to be done due technical barriers from the patient's side.   PATIENT NAME: Molly [name] DOB: [birth year] MRN: [id number]   PRIMARY CARE PROVIDER:   [name], MD [place] [address] [facility] [address] [place], [place] [address]   REFERRING PROVIDER: Dr. Antony Contras (Neurology).  Dr. Norma Fredrickson (Psych) Dr. Larae Grooms (Cardiology) Dr. Antony Contras (Neurology)  RESPONSIBLE PARTY: *(dtr) Allene Dillon [phone] e-mail: [email or web address]. (dtr) Ms Ishmael Holter, e-mail:  [email or web address]    1. Advance Care Planning: A. Advanced Care Directives: DNR/MOST form in [place] and in home. B. Goals of Care: Maintain strength and function. Optimize mobility as best she can.   2. Symptom management:  A. Hallucinations: Daughter [name] relates that when patient's Aricept was weaned to off, patient developed recurrence of visual (spiders) and tactile ("toothpaste" coming out of her drippy nose; feels sand in her hair when running her fingers through her hair; skin peeling away from the tips of her fingers). One improvement: when patient has the visual hallucinations, they are no longer fearful to her. [name] consulted Dr. Leonie Man, who approved restart of low dose Aricept (2.5mg  qd). Subsequently, patient much improved.  Patient continues with clear secretions from nose; has trialed Flonase and Claritin/Zyrtec in the past without improvement.  -I sent [name] an article regarding rhinorrhea associated with Parkinson's disease; a study revealed good response with nasal Atrovent and asked her to consider running this by Dr. Leonie Man for his  approval.  3. Functional status:  Continues good movement since start of Sinemet some months ago. Able to bowl with her We "better than ever". Can self-transfer with use of lift chair and occasionally needs assist to stand from chair. Maintaining good spirits on Celexa. Her weight is stable at 149 lbs. At 5'1" her BMI is 28.2 kg/m2.    4. Family supports: Daughter [name] is PCG. She has physical ailments that can make physical care of patient taxing.  [name] has hired 3 part time aides to assist with her mom's care ([employer] by [employer]), and 2 of patient's other daughters have been able to arrange some small amounts of time off work (10-20hrs/year), to help assist with adult care (benefit through [employer], which contracts with employers. Employers pay [employer] to employ home care givers, so employees can stay at work).  -Discussed baby monitor to help daughter keep an eye on her mom from other rooms, so she doesn't have to constantly run in to check on her.              5. F/U: Tuesday Jan 12th at 2pm   I spent 30 minutes providing this consultation, from 2 pm to 2:30 pm. More than 50% of the time in this consultation was spent coordinating communication.    HISTORY OF PRESENT ILLNESS:  Molly [name] is an 85 y.o. female with medical h/o CAD, HLD, HTN, auditory/tactile hallucinations (improved) and osteoporosis. This is a f/u Palliative Care visit from 10/25/2019.   CODE STATUS: DNR. MOST: DNR/DNI (completed 04/13/2019). Scope of Medical Interventions: Comfort measures. Antibiotics: Determine [place] or limitations at time of infection. No to IVFs, no tube feedings.    PPS:  40%    HOSPICE ELIGIBILITY/DIAGNOSIS: TBD  PAST MEDICAL HISTORY:  Past Medical History:  Diagnosis Date  . Acid reflux   . Anemia   . Arthritis   . CAD (coronary artery disease)   . High cholesterol   . Hyperlipidemia   . Hypertension   . Osteoporosis   . Spinal stenosis   . Stroke (cerebrum) ([place])  12/07/2017    SOCIAL HX:  Social History   Tobacco Use  . Smoking status: Never Smoker  . Smokeless tobacco: Never Used  Substance Use Topics  . Alcohol use: No    Alcohol/week: 0.0 standard drinks    ALLERGIES:  Allergies  Allergen Reactions  . Buprenorphine Hcl Shortness Of Breath    Chest pain  . Codeine Nausea And Vomiting    ulcers  . Morphine And Related Shortness Of Breath    Chest pain, made me crazy  . Sulfacetamide Sodium Nausea And Vomiting  . Vicodin [Hydrocodone-Acetaminophen] Nausea And Vomiting  . Evista [Raloxifene] Other (See Comments) and Nausea And Vomiting    Upset stomach  . Hydromorphone Nausea And Vomiting    Sensitive  . Other Other (See Comments)    Narcotic-like medications - does NOT tolerate pain meds, causes unrelating volatile vomiting and drops in BP below 80/40 with dizziness and fainting  Tourniquet - Please place tourniquet over cloth - causes sever bruising.  Blood pressure cuff - please use manual blood pressure cuff - causes excoriating pain using machine and bruising     . Sulfur Nausea And Vomiting    Vomited until bleeding occurred and developed an active stomach ulcer  . Tape Other (See Comments)    Rips skin  . Vistaril [Hydroxyzine Hcl] Other (See Comments)    Confusion, hallucinations     PERTINENT MEDICATIONS:  Outpatient Encounter Medications as of 11/06/2019  Medication Sig  . acetaminophen (TYLENOL) 500 MG tablet Take 500 mg by mouth every 8 (eight) hours as needed for mild pain.   . [place]well[place]-Glucosamine-Vit D (OSTEO BI-FLEX ONE PER DAY PO) Take 2,000 Units by mouth 2 (two) times daily.  . calcium-vitamin D (OSCAL WITH D) 500-200 MG-UNIT tablet Take 1 tablet by mouth 2 (two) times daily. (Patient not taking: Reported on 08/21/2019)  . carbidopa-levodopa (SINEMET) 25-100 MG tablet Take 0.5 tablets by mouth 4 (four) times daily.  . citalopram (CELEXA) 10 MG tablet Take 1 tablet (10 mg total) by mouth daily.  . clopidogrel  (PLAVIX) 75 MG tablet TAKE 1 TABLET BY MOUTH EVERY DAY  . denosumab (PROLIA) 60 MG/ML SOLN injection Inject 60 mg into the skin every 6 (six) months. Administer in upper arm, thigh, or abdomen  . donepezil (ARICEPT) 5 MG tablet 2.5 mg.   . hydrOXYzine (ATARAX/VISTARIL) 10 MG tablet Take 1 tablet (10 mg total) by mouth 2 (two) times daily.  . metoprolol succinate (TOPROL-XL) 25 MG 24 hr tablet Take 0.5 tablets (12.5 mg total) by mouth daily with supper.  . nitroGLYCERIN (NITROSTAT) 0.4 MG SL tablet Place 1 tablet (0.4 mg total) under the tongue every 5 (five) minutes as needed for chest pain (x 3 pills).  . pantoprazole (PROTONIX) 40 MG tablet Take 1 tablet (40 mg total) by mouth daily. (Patient taking differently: Take 40 mg by mouth 2 (two) times daily. )  . polyethylene glycol powder (MIRALAX) 17 GM/SCOOP powder Take 1 Container by mouth once.  . Polysaccharide Iron Complex (NU-IRON PO) Take by mouth.  . Probiotic Product (ALIGN) 4 MG CAPS Take 4 mg by mouth daily.   . rosuvastatin (CRESTOR)  20 MG tablet Take 20 mg by mouth at bedtime.  . traZODone (DESYREL) 50 MG tablet TAKE 1 TABLET BY MOUTH EVERYDAY AT BEDTIME (Patient taking differently: 25 mg. TAKE 1/2 TABLET BY MOUTH EVERYDAY AT BEDTIME)   No facility-administered encounter medications on file as of 11/06/2019.     PHYSICAL EXAM:   PE deferred d/t audio only nature of telehealth visit   Julianne Handler, NP

## 2019-11-10 ENCOUNTER — Other Ambulatory Visit (HOSPITAL_COMMUNITY): Payer: Self-pay | Admitting: Psychiatry

## 2019-11-17 ENCOUNTER — Other Ambulatory Visit: Payer: Self-pay | Admitting: Neurology

## 2019-11-19 ENCOUNTER — Other Ambulatory Visit: Payer: Self-pay

## 2019-11-19 MED ORDER — DONEPEZIL HCL 5 MG PO TABS: 5.0000 mg | ORAL_TABLET | Freq: Every day | ORAL | 1 refills | Status: DC

## 2019-11-28 ENCOUNTER — Other Ambulatory Visit: Payer: Self-pay

## 2019-11-28 MED ORDER — DONEPEZIL HCL 5 MG PO TABS: 5.0000 mg | ORAL_TABLET | Freq: Every day | ORAL | 1 refills | Status: DC

## 2019-12-23 ENCOUNTER — Ambulatory Visit: Payer: Medicare Other | Attending: Internal Medicine

## 2019-12-23 DIAGNOSIS — Z23 Encounter for immunization: Secondary | ICD-10-CM

## 2019-12-23 NOTE — Progress Notes (Signed)
   Covid-19 Vaccination Clinic  Name:  Molly [name]    MRN: [id number] DOB: Nov 01, 1929  12/23/2019  [name] was observed post Covid-19 immunization for 30 minutes based on pre-vaccination screening without incidence. She was provided with Vaccine Information Sheet and instruction to access the V-Safe system.   [name] was instructed to call 911 with any severe reactions post vaccine: Marland Kitchen Difficulty breathing  . Swelling of your face and throat  . A fast heartbeat  . A bad rash all over your body  . Dizziness and weakness    Immunizations Administered    Name Date Dose VIS Date Route   Pfizer COVID-19 Vaccine 12/23/2019 12:09 PM 0.3 mL 11/23/2019 Intramuscular   Manufacturer: [place]   Lot: [id number]   [place]: [id number]

## 2019-12-25 ENCOUNTER — Other Ambulatory Visit: Payer: Medicare Other | Admitting: Internal Medicine

## 2019-12-25 ENCOUNTER — Encounter: Payer: Self-pay | Admitting: Internal Medicine

## 2019-12-25 DIAGNOSIS — J3489 Other specified disorders of nose and nasal sinuses: Secondary | ICD-10-CM

## 2019-12-25 DIAGNOSIS — Z515 Encounter for palliative care: Secondary | ICD-10-CM

## 2019-12-25 NOTE — Progress Notes (Signed)
Cardiology Office Note   Date:  12/26/2019   ID:  Molly [name], DOB 1929-11-21, MRN [id number]  PCP:  [name], MD    No chief complaint on file.  CAD  Wt Readings from Last 3 Encounters:  12/26/19 154 lb 12.8 oz (70.2 kg)  01/23/19 151 lb (68.5 kg)  12/12/18 157 lb 3.2 oz (71.3 kg)       History of Present Illness: Molly [name] is a 86 y.o. female   who has had CAD, prior BMS to RCA. She had a mild abnormality on her stress test in 2012. She has been managed medically.  Prior to her stent, she had a chest pain that would not resolve.   She had back surgeryin 2016. It was very complicated. She had a DVT and then was treated with heparin. There was apparent postoperative bleeding. She subsequent had a IVC filter placed. She was not a candidate for anticoagulation since that time. She did recover fairly well after the second surgery. She had some right foot drop postoperatively. Her pain was much better. During all this, she had no chest discomfort or any cardiac issues.  Her daughteradjustsLasix for swelling. The patient has tolerated this well.  Her husband passed away just before he would have turned 80, in 2017.  She had a stroke in 12/18 and was treated with TPA.    She developed neurologic sx in 2019 and started treatment with neurology with Aricept in 2020.  Palliative care is now involved.  She is a DNR/DNI.   Denies : Chest pain. Dizziness. Leg edema. Nitroglycerin use. Orthopnea. Palpitations. Paroxysmal nocturnal dyspnea. Shortness of breath. Syncope.   LE swelling managed with leg elevation and compression stockings.  She uses a walker and is careful to avoid falling.   Daughter present for visit today.  Past Medical History:  Diagnosis Date  . Acid reflux   . Anemia   . Arthritis   . CAD (coronary artery disease)   . High cholesterol   . Hyperlipidemia   . Hypertension   . Osteoporosis   . Spinal stenosis   . Stroke  (cerebrum) ([place]) 12/07/2017    Past Surgical History:  Procedure Laterality Date  . ABDOMINAL HYSTERECTOMY    . APPENDECTOMY    . BACK SURGERY     Rod and Pins Placed   . CARDIAC CATHETERIZATION N/A 11/17/2015   Procedure: Left Heart Cath and Coronary Angiography;  Surgeon: Jettie Booze, MD;  Location: [facility];  Service: Cardiovascular;  Laterality: N/A;  . CAROTID STENT    . UPPER GI ENDOSCOPY       Current Outpatient Medications  Medication Sig Dispense Refill  . acetaminophen (TYLENOL) 500 MG tablet Take 500 mg by mouth every 8 (eight) hours as needed for mild pain.     . [place]well[place]-Glucosamine-Vit D (OSTEO BI-FLEX ONE PER DAY PO) Take 2,000 Units by mouth 2 (two) times daily.    . calcium-vitamin D (OSCAL WITH D) 500-200 MG-UNIT tablet Take 1 tablet by mouth 2 (two) times daily. 120 tablet 0  . carbidopa-levodopa (SINEMET) 25-100 MG tablet Take 0.5 tablets by mouth 4 (four) times daily. 360 tablet 1  . citalopram (CELEXA) 10 MG tablet Take 1 tablet (10 mg total) by mouth daily. 90 tablet 2  . clopidogrel (PLAVIX) 75 MG tablet TAKE 1 TABLET BY MOUTH EVERY DAY 90 tablet 0  . denosumab (PROLIA) 60 MG/ML SOLN injection Inject 60 mg into the skin every 6 (six)  months. Administer in upper arm, thigh, or abdomen    . donepezil (ARICEPT) 5 MG tablet 2.5 mg.     . hydrOXYzine (ATARAX/VISTARIL) 10 MG tablet TAKE 1 TABLET BY MOUTH TWICE A DAY 180 tablet 1  . metoprolol succinate (TOPROL-XL) 25 MG 24 hr tablet Take 0.5 tablets (12.5 mg total) by mouth daily with supper. 30 tablet 0  . nitroGLYCERIN (NITROSTAT) 0.4 MG SL tablet Place 1 tablet (0.4 mg total) under the tongue every 5 (five) minutes as needed for chest pain (x 3 pills). 25 tablet 3  . pantoprazole (PROTONIX) 40 MG tablet Take 1 tablet (40 mg total) by mouth daily. (Patient taking differently: Take 40 mg by mouth 2 (two) times daily. ) 30 tablet 0  . polyethylene glycol powder (MIRALAX) 17 GM/SCOOP powder Take 1  Container by mouth once.    . Polysaccharide Iron Complex (NU-IRON PO) Take by mouth.    . Probiotic Product (ALIGN) 4 MG CAPS Take 4 mg by mouth daily.     . rosuvastatin (CRESTOR) 20 MG tablet Take 20 mg by mouth at bedtime.    . traZODone (DESYREL) 50 MG tablet TAKE 1 TABLET BY MOUTH EVERYDAY AT BEDTIME (Patient taking differently: 25 mg. TAKE 1/2 TABLET BY MOUTH EVERYDAY AT BEDTIME) 90 tablet 1   No current facility-administered medications for this visit.    Allergies:   Buprenorphine hcl, Codeine, Morphine and related, Sulfacetamide sodium, Vicodin [hydrocodone-acetaminophen], Evista [raloxifene], Hydromorphone, Other, Sulfur, Tape, and Vistaril [hydroxyzine hcl]    Social History:  The patient  reports that she has never smoked. She has never used smokeless tobacco. She reports that she does not drink alcohol or use drugs.   Family History:  The patient's family history includes Anemia in her mother; Heart attack in her brother.    ROS:  Please see the history of present illness.   Otherwise, review of systems are positive for nightmares- related to Parkinsons like syndrome- improved.   All other systems are reviewed and negative.    PHYSICAL EXAM: VS:  BP 122/86   Pulse (!) 56   Ht 5\' 3"  (1.6 m)   Wt 154 lb 12.8 oz (70.2 kg)   LMP  (LMP Unknown)   SpO2 99%   BMI 27.42 kg/m  , BMI Body mass index is 27.42 kg/m. GEN: Well nourished, well developed, in no acute distress  HEENT: normal  Neck: no JVD, carotid bruits, or masses Cardiac: RRR; 2/6 systolic murmur, no rubs, or gallops,no edema  Respiratory:  clear to auscultation bilaterally, normal work of breathing GI: soft, nontender, nondistended, + BS MS: no deformity or atrophy  Skin: warm and dry, no rash Neuro:  Strength and sensation are intact Psych: euthymic mood, full affect   EKG:   The ekg ordered today demonstrates sinus bradycardia, nonspecific ST changes   Recent Labs: 01/23/2019: Platelets 179; TSH  3.010 06/12/2019: ALT 31; BUN 16; Creatinine, Ser 0.89; Hemoglobin 12.9; Potassium 4.5; Sodium 138   Lipid Panel    Component Value Date/Time   CHOL 133 12/08/2017 0309   TRIG 98 12/08/2017 0309   HDL 63 12/08/2017 0309   CHOLHDL 2.1 12/08/2017 0309   VLDL 20 12/08/2017 0309   LDLCALC 50 12/08/2017 0309     Other studies Reviewed: Additional studies/ records that were reviewed today with results demonstrating:05/2019 labs.   ASSESSMENT AND PLAN:  1. CAD: Prior PCI.  No ANgina on medical therapy.  Continue secondary prevention. 2. Chronic diastolic heart failure: Appears euvolemic.  3. LE edema: Well controlled.  Using compression stocking. Dietary salt limited. 4. GERD: Controlled.    Current medicines are reviewed at length with the patient today.  The patient concerns regarding her medicines were addressed.  The following changes have been made:  No change  Labs/ tests ordered today include:  No orders of the defined types were placed in this encounter.   Recommend 150 minutes/week of aerobic exercise Low fat, low carb, high fiber diet recommended  Disposition:   FU in 1 year   Signed, Larae Grooms, MD  12/26/2019 4:41 PM    [facility]  [address], [place], [place]  [address] Phone: [phone]; Fax: [phone]

## 2019-12-25 NOTE — Progress Notes (Signed)
Jan 12th, 2020 [facility] Consult Note Telephone: [phone]  Fax: [phone]   Due to the current COVID-19 infection/crises, the family prefer, and have given their verbal consent for, a provider visit via telemedicine. HIPPA policies of confidentially were discussed. Video-audio (telehealth) contact was unable to be done due technical barriers from the patient's side.   PATIENT NAME: Molly [name] DOB: 07/07/1929 MRN: [id number]  PRIMARY CARE PROVIDER:   [name], MD [place] [address] [facility] [address] [place], [place] [address]   REFERRING PROVIDER: Dr. Antony Contras (Neurology).  Dr. Norma Fredrickson (Psych) Dr. Larae Grooms (Cardiology) Dr. Antony Contras (Neurology)   RESPONSIBLE PARTY: *(dtr) Allene Dillon [phone] e-mail: [email or web address]. (dtr) Ms Ishmael Holter, e-mail:  [email or web address]    1. Advance Care Planning:  A. Advanced Care Directives: DNR/MOST form in [place] and in home.  B. Goals of Care: Maintain strength and function. Optimize mobility as best she can.  2. Symptom management:   A. Hallucinations: Past hallucinations had improved with initiation of Sinemet and los dose Aricept. Lately daughter [name] reports increase in nocturnal dreams/nightmares; some awakening hallucinations. Patient doesn't report these as disturbing; recognizes they are dreams. Lately patient reporting RUE tremors with weakness.   Rhinorrhea improved with use of nasal Atrovent. Anecdotally I've seen improvement of drippy noses with OTC Mucinex. Patient is going to give this a try.    Patient received her COVID shot recently.   3. Functional status:  Mostly able to self-transfer with just occasional assist. She's gained a few lbs to current weight of 153lbs. At 5'1" her BMI is 28.9kg/m2.    4. Family supports: (reviewed; as per last note) Daughter [name] is PCG. She has physical ailments that can make physical care of patient  taxing.  [name] has hired 3 part time aides to assist with her mom's care. PT working with patient.               5. F/U: Tue 02/12/2020   I spent 30 minutes providing this consultation, from 2 pm to 2:30 pm. More than 50% of the time in this consultation was spent coordinating communication.    HISTORY OF PRESENT ILLNESS:  Molly [name] is an 86 y.o. female with medical h/o CAD, HLD, HTN, auditory/tactile hallucinations (improved) and osteoporosis. This is a f/u Palliative Care visit from 11/06/2019.   CODE STATUS: DNR. MOST: DNR/DNI (completed 04/13/2019). Scope of Medical Interventions: Comfort measures. Antibiotics: Determine [place] or limitations at time of infection. No to IVFs, no tube feedings.    PPS:  40%    HOSPICE ELIGIBILITY/DIAGNOSIS: TBD   PAST MEDICAL HISTORY:  Past Medical History:  Diagnosis Date  . Acid reflux   . Anemia   . Arthritis   . CAD (coronary artery disease)   . High cholesterol   . Hyperlipidemia   . Hypertension   . Osteoporosis   . Spinal stenosis   . Stroke (cerebrum) ([place]) 12/07/2017    SOCIAL HX:  Social History   Tobacco Use  . Smoking status: Never Smoker  . Smokeless tobacco: Never Used  Substance Use Topics  . Alcohol use: No    Alcohol/week: 0.0 standard drinks    ALLERGIES:  Allergies  Allergen Reactions  . Buprenorphine Hcl Shortness Of Breath    Chest pain  . Codeine Nausea And Vomiting    ulcers  . Morphine And Related Shortness Of Breath    Chest pain, made me crazy  . Sulfacetamide Sodium Nausea And  Vomiting  . Vicodin [Hydrocodone-Acetaminophen] Nausea And Vomiting  . Evista [Raloxifene] Other (See Comments) and Nausea And Vomiting    Upset stomach  . Hydromorphone Nausea And Vomiting    Sensitive  . Other Other (See Comments)    Narcotic-like medications - does NOT tolerate pain meds, causes unrelating volatile vomiting and drops in BP below 80/40 with dizziness and fainting  Tourniquet - Please place tourniquet over  cloth - causes sever bruising.  Blood pressure cuff - please use manual blood pressure cuff - causes excoriating pain using machine and bruising     . Sulfur Nausea And Vomiting    Vomited until bleeding occurred and developed an active stomach ulcer  . Tape Other (See Comments)    Rips skin  . Vistaril [Hydroxyzine Hcl] Other (See Comments)    Confusion, hallucinations     PERTINENT MEDICATIONS:  Outpatient Encounter Medications as of 12/25/2019  Medication Sig  . acetaminophen (TYLENOL) 500 MG tablet Take 500 mg by mouth every 8 (eight) hours as needed for mild pain.   . [place]-Glucosamine-Vit D (OSTEO BI-FLEX ONE PER DAY PO) Take 2,000 Units by mouth 2 (two) times daily.  . calcium-vitamin D (OSCAL WITH D) 500-200 MG-UNIT tablet Take 1 tablet by mouth 2 (two) times daily.  . carbidopa-levodopa (SINEMET) 25-100 MG tablet Take 0.5 tablets by mouth 4 (four) times daily.  . citalopram (CELEXA) 10 MG tablet Take 1 tablet (10 mg total) by mouth daily.  . clopidogrel (PLAVIX) 75 MG tablet TAKE 1 TABLET BY MOUTH EVERY DAY  . denosumab (PROLIA) 60 MG/ML SOLN injection Inject 60 mg into the skin every 6 (six) months. Administer in upper arm, thigh, or abdomen  . donepezil (ARICEPT) 5 MG tablet 2.5 mg.   . donepezil (ARICEPT) 5 MG tablet Take 1 tablet (5 mg total) by mouth at bedtime.  . hydrOXYzine (ATARAX/VISTARIL) 10 MG tablet TAKE 1 TABLET BY MOUTH TWICE A DAY  . metoprolol succinate (TOPROL-XL) 25 MG 24 hr tablet Take 0.5 tablets (12.5 mg total) by mouth daily with supper.  . nitroGLYCERIN (NITROSTAT) 0.4 MG SL tablet Place 1 tablet (0.4 mg total) under the tongue every 5 (five) minutes as needed for chest pain (x 3 pills).  . pantoprazole (PROTONIX) 40 MG tablet Take 1 tablet (40 mg total) by mouth daily. (Patient taking differently: Take 40 mg by mouth 2 (two) times daily. )  . polyethylene glycol powder (MIRALAX) 17 GM/SCOOP powder Take 1 Container by mouth once.  . Polysaccharide Iron  Complex (NU-IRON PO) Take by mouth.  . Probiotic Product (ALIGN) 4 MG CAPS Take 4 mg by mouth daily.   . rosuvastatin (CRESTOR) 20 MG tablet Take 20 mg by mouth at bedtime.  . traZODone (DESYREL) 50 MG tablet TAKE 1 TABLET BY MOUTH EVERYDAY AT BEDTIME (Patient taking differently: 25 mg. TAKE 1/2 TABLET BY MOUTH EVERYDAY AT BEDTIME)   No facility-administered encounter medications on file as of 12/25/2019.    PHYSICAL EXAM:  PE deferred d/t audio only nature of telehealth visit   Julianne Handler, NP

## 2019-12-26 ENCOUNTER — Other Ambulatory Visit: Payer: Self-pay

## 2019-12-26 ENCOUNTER — Encounter: Payer: Self-pay | Admitting: Interventional Cardiology

## 2019-12-26 ENCOUNTER — Ambulatory Visit: Payer: Medicare Other | Admitting: Interventional Cardiology

## 2019-12-26 VITALS — BP 122/86 | HR 56 | Ht 63.0 in | Wt 154.8 lb

## 2019-12-26 DIAGNOSIS — I639 Cerebral infarction, unspecified: Secondary | ICD-10-CM

## 2019-12-26 DIAGNOSIS — I5032 Chronic diastolic (congestive) heart failure: Secondary | ICD-10-CM | POA: Diagnosis not present

## 2019-12-26 DIAGNOSIS — I2510 Atherosclerotic heart disease of native coronary artery without angina pectoris: Secondary | ICD-10-CM

## 2019-12-26 DIAGNOSIS — R600 Localized edema: Secondary | ICD-10-CM | POA: Diagnosis not present

## 2019-12-26 NOTE — Patient Instructions (Signed)

## 2019-12-27 ENCOUNTER — Ambulatory Visit: Payer: Medicare Other | Admitting: Interventional Cardiology

## 2019-12-27 NOTE — Addendum Note (Signed)
Addended by: Gar Ponto on: 12/27/2019 05:19 PM   Modules accepted: Orders

## 2020-01-13 ENCOUNTER — Ambulatory Visit: Payer: Self-pay

## 2020-01-13 ENCOUNTER — Ambulatory Visit: Payer: Medicare Other | Attending: Internal Medicine

## 2020-01-13 DIAGNOSIS — Z23 Encounter for immunization: Secondary | ICD-10-CM | POA: Insufficient documentation

## 2020-01-13 NOTE — Progress Notes (Signed)
   Covid-19 Vaccination Clinic  Name:  Molly [name]    MRN: [id number] DOB: Jun 09, 1929  01/13/2020  Molly [name] was observed post Covid-19 immunization for 15 minutes without incidence. She was provided with Vaccine Information Sheet and instruction to access the V-Safe system.   Molly [name] was instructed to call 911 with any severe reactions post vaccine: Marland Kitchen Difficulty breathing  . Swelling of your face and throat  . A fast heartbeat  . A bad rash all over your body  . Dizziness and weakness    Immunizations Administered    Name Date Dose VIS Date Route   Pfizer COVID-19 Vaccine 01/13/2020 10:29 AM 0.3 mL 11/23/2019 Intramuscular   Manufacturer: [place]   Lot: [id number]   [place]: [id number]

## 2020-01-19 ENCOUNTER — Ambulatory Visit: Payer: Medicare Other

## 2020-01-25 ENCOUNTER — Other Ambulatory Visit: Payer: Self-pay | Admitting: Neurology

## 2020-01-29 ENCOUNTER — Other Ambulatory Visit: Payer: Self-pay

## 2020-01-29 ENCOUNTER — Ambulatory Visit: Payer: Medicare Other | Admitting: Adult Health

## 2020-01-29 ENCOUNTER — Encounter: Payer: Self-pay | Admitting: Adult Health

## 2020-01-29 VITALS — BP 131/66 | HR 56 | Temp 97.7°F | Ht 62.0 in | Wt 154.0 lb

## 2020-01-29 DIAGNOSIS — G20C Parkinsonism, unspecified: Secondary | ICD-10-CM

## 2020-01-29 DIAGNOSIS — R441 Visual hallucinations: Secondary | ICD-10-CM

## 2020-01-29 DIAGNOSIS — G232 Striatonigral degeneration: Secondary | ICD-10-CM

## 2020-01-29 DIAGNOSIS — G3184 Mild cognitive impairment, so stated: Secondary | ICD-10-CM

## 2020-01-29 DIAGNOSIS — I639 Cerebral infarction, unspecified: Secondary | ICD-10-CM

## 2020-01-29 NOTE — Progress Notes (Signed)
[facility] [address] [place]. [place] [address] [phone]       OFFICE FOLLOW-UP NOTE  Molly. Molly [name] Date of Birth:  10/06/1929 Medical Record Number:  [id number]   HPI: Molly [name] is 86 year old Caucasian lady seen today for first office follow-up visit following hospital admission for stroke in December 2018. She is accompanied by her daughter. History is obtained from them as well as review of electronic medical records. I have personally reviewed imaging films. Molly [name] a 86 y.o.femalewith PMH of coronary artery disease, hyperlipidemia, hypertension, atrial fibrillation on no anticoagulationpresenting to the emergency room with a last known normal of 9:15 AM on 12/07/2017 withleft-sided weakness and inability to stand.NIHSS 10.CT-scan of the brain -no acute change. Aspects 10. CT Angio of head and neck no large vessel occlusion but distal right M3 stenosis versus clot..CT perfusion no cortical infarct but tissue at risk 9 mL CT perfusion study of the brain shows a small area of penumbra in the right MCA territory with no core. IV TPA given Wed 12/07/17. Episode is likely aborted stroke with right M2 stenosis. MRI scan did not show definite right hemispheric stroke but clinically she had left hemiparalysis complete the stent with a small right brain stroke not seen on MRI likely because patient got tPA. Vascular risk factors identified included atrial fibrillation, hypertension and hyperlipidemia as well as coronary artery disease and advanced age. Plavix was added to the patient's aspirin and statin dose was increased. She was transferred to inpatient rehabilitation from where she made gradual progress and is currently living at home.   02/22/18 visit Dr. Leonie [name]: She is opting improvement in left-sided strength but still needs 1 person assist to get up and walk with a walker. She walks very limited. She fatigues very easily and gets tired.  She needs a person to walk close to her. She needs help with activities like dressing herself and feeding herself and cannot be left alone. The patient is getting home physical and occupational therapy and has finished patient therapy the daughters not satisfied and feels patient still has trouble with eating and pocketing on the left side and fatigues easily when sitting. There are plans to consider outpatient TEE and loop recorder for this patient but she has a history of epidural hematoma in the spine while she was on warfarin for DVT several years ago. She seems quite frail and his fall risk and hence I do not believe she is a good long-term candidate for anticoagulation since doing further workup with a TEE and loop recorder is not a good idea in my opinion. I have discussed this with the patient's daughter Molly [name] who is in agreement with my plan  05/25/18 UPDATE: Patient is being seen today for 94-month follow-up and is accompanied by her daughter and granddaughter.  After previous visit, is recommended to continue PT/OT but as patient was unable to afford outpatient visits it was recommended to continue home care but they were unable to obtain at that time due to orders not being placed.  Daughter stated that they decided just to wait until this follow-up visit to have an order placed.  Patient's complaining of increasing memory loss over the past 1 to 2 weeks.  Denies history of any memory loss.  AFT 12, recall 3/3, clock drawing 4/4 and able to do serial additions quickly.  Patient stated that her memory waxes and wanes and at times it can be getting other times she is unable to remember certain  things.  Advised patient that this is most likely due to age-related memory loss and encouraged to do mind exercises.  Patient also has complaining of generalized weakness and deconditioning. States her day consists of sitting in her chair and watching TV as it has been difficult for her to get around. Deconditioning  most likely and would benefit from continued therapies. Continues to take Plavix with mild bruising but no bleeding.  Continues to take Crestor without side effects myalgias.  Blood pressure today 141/69 but per daughter this is monitored at home and is normally lower.  She is using Rollator for ambulation but has used this for approximately 4 years now.  Denies new or worsening stroke/TIA symptoms. Update 01/22/2019 : She is seen today urgently upon request from family.  She has had new complaints of formed visual hallucinations for the last 4 to 5 weeks.  These occur mostly at night and very rarely during the day.  She sees formed figures like bugs, snakes, insects crawling out of her mouth, ear or face.  She has as a result had very poor sleep.  At times she falls asleep and wakes up feeling scared.  As result of not sleeping well she feels quite exhausted and tired during the day.  She gets quite upset when she ceases objects.  She has not noticed any decrease in vision, her memory and cognition have also remained stable without worsening.  She is also had some depth perception issues and feels that when she is walking the floor is uneven.  She feels at times that her bathroom floor is carpeted when it is tired.  She is able to watch television and recognize faces and able to read also quite well without difficulty.  She feels overall weak all over and is currently getting home physical and occupational therapy.  She does have 24-hour care at home.  She at times gets confused and disoriented but this does not stay long.  Family is concerned about these.  She has had chronic tinnitus in the ears and hearing loss but feels of late this seems to have gotten worse and she has constant sound of her swarm of bees in her ears as well.  She has not had any recent brain imaging or lab work done in the last month.  She has not had any significant medication changes either.  There has been no recent head injury or loss of  consciousness or significant headaches or other focal neurological symptoms  Update 06/12/2019 : She returns for follow-up today after last virtual follow-up visit on 04/03/2019.  Patient was started on Aricept 10 mg daily by me and appears to be tolerating it well in fact her visualized lesions appear much improved and she has not had them for the last 1 to 2 weeks.  However she has become weak all over and is unable to get up and walk even with physical therapist.  Physical therapy note states that when patient is stood up she has severe gait initiation apraxia and is unable to step requires a lot of effort to do so.  There is also tremulousness of the leg when she stands.  There is also some intermittent tremors in the hands noted as well.  The patient was seen by geriatric psychiatrist Dr. Casimiro Needle who initially tried aripiprazole but patient did not tolerate it due to nausea vomiting and it was stopped.  He recommended trying Nuplazid for psychosis and hallucinations but patient's insurance did not cover it  and she could not afford the cost of  $550 a month and hence it was not tried.  I recommended Risperdal but patient did not try that either.  She is complaining of difficulty sleeping at night now.  Previously she was sleeping much better on Seroquel but this has been discontinued as well.  The patient is also complaining of some weakness in the right hand as well as right side.  She had an MRI scan of the brain done in February which had shown mild generalized atrophy but no infarct.  She does have some chronic right leg weakness from previous degenerative back disease.  Update 08/21/19 : She returns for follow-up after last visit 2 months ago.  She is accompanied by her daughter.  They have noticed definite benefit after starting Sinemet.  She was initially taking half a tablet 3 times daily but noticed early morning stiffness and gait initiation difficulties which appear to have improved after changing the  Sinemet dose to 4 times daily.  She is able to walk better now and able to assist with getting out of bed and transfers.  She walks with a walker with a close supervision.  She has had no falls or injuries.  She is also noted improvement in her tremors as well as feet freezing.  She is able to sleep better.  She still has occasional visual hallucinations but these are not as disabling and she feels she can realize that they are not real.  She was on Aricept 10 mg but the dose was reduced to 5 mg as it was noticed that made her quite anxious and she is not sleeping well which appears to have improved.  She does have 24-hour care at home.  She did undergo lab work after last visit on 06/12/2019 and complete metabolic panel labs and CBC were unremarkable.  I had ordered an MRI scan of the brain but for unclear reason that has not been done.  She has no new complaints today.  Update 01/29/2020: Molly [name] is a 86 year old female who is being seen today for follow-up accompanied by her daughter.  Continues on Sinemet 0.5 tabs 4 times daily with ongoing benefit in regards to ambulation, tremors and initial improvement of vivid dreams and hallucinations but daughter reports over the past 3 months she has been experiencing recurrence of prior vivid dreams and some hallucinations but not as severe.  These are not bothersome to patient.  It appears as though she perseverates on chronic nasal drip persistently feeling as though she needs to wipe her nose and there is something in there but family is unable to see with patient is speaking of.  She does have appointment with geriatric psychiatry on 3/4.  She also continues on citalopram 10 mg daily, Aricept 2.5 mg daily and trazodone 50 mg nightly.  Continues to live with her daughter receiving 24-hour care at home and assistance fall ambulating with rolling walker.  Continues on Plavix and Crestor for secondary stroke prevention.  Blood pressure satisfactory 131/66.  No  further concerns at this time.       ROS:   14 system review of systems is positive for weakness, memory loss, activity change, leg swelling, insomnia, vision hallucinations, walking difficulty,   and all other systems negative  PMH:  Past Medical History:  Diagnosis Date  . Acid reflux   . Anemia   . Arthritis   . CAD (coronary artery disease)   . High cholesterol   .  Hyperlipidemia   . Hypertension   . Osteoporosis   . Spinal stenosis   . Stroke (cerebrum) ([place]) 12/07/2017    Social History:  Social History   Socioeconomic History  . Marital status: Widowed    Spouse name: Not on file  . Number of children: Not on file  . Years of education: Not on file  . Highest education level: Not on file  Occupational History  . Not on file  Tobacco Use  . Smoking status: Never Smoker  . Smokeless tobacco: Never Used  Substance and Sexual Activity  . Alcohol use: No    Alcohol/week: 0.0 standard drinks  . Drug use: No  . Sexual activity: Not Currently  Other Topics Concern  . Not on file  Social History Narrative  . Not on file   Social Determinants of Health   Financial Resource Strain:   . Difficulty of Paying Living Expenses: Not on file  Food Insecurity:   . Worried About Charity fundraiser in the Last Year: Not on file  . Ran Out of Food in the Last Year: Not on file  Transportation Needs:   . Lack of Transportation (Medical): Not on file  . Lack of Transportation (Non-Medical): Not on file  Physical Activity:   . Days of Exercise per Week: Not on file  . Minutes of Exercise per Session: Not on file  Stress:   . Feeling of Stress : Not on file  Social Connections:   . Frequency of Communication with Friends and Family: Not on file  . Frequency of Social Gatherings with Friends and Family: Not on file  . Attends Religious Services: Not on file  . Active Member of Clubs or Organizations: Not on file  . Attends Archivist Meetings: Not on file    . Marital Status: Not on file  Intimate Partner Violence:   . Fear of Current or Ex-Partner: Not on file  . Emotionally Abused: Not on file  . Physically Abused: Not on file  . Sexually Abused: Not on file    Medications:   Current Outpatient Medications on File Prior to Visit  Medication Sig Dispense Refill  . acetaminophen (TYLENOL) 500 MG tablet Take 500 mg by mouth every 8 (eight) hours as needed for mild pain.     . [place]-Glucosamine-Vit D (OSTEO BI-FLEX ONE PER DAY PO) Take 2,000 Units by mouth 2 (two) times daily.    . calcium-vitamin D (OSCAL WITH D) 500-200 MG-UNIT tablet Take 1 tablet by mouth 2 (two) times daily. 120 tablet 0  . carbidopa-levodopa (SINEMET) 25-100 MG tablet Take 0.5 tablets by mouth 4 (four) times daily. 360 tablet 1  . citalopram (CELEXA) 10 MG tablet Take 1 tablet (10 mg total) by mouth daily. 90 tablet 2  . clopidogrel (PLAVIX) 75 MG tablet TAKE 1 TABLET BY MOUTH EVERY DAY 90 tablet 0  . denosumab (PROLIA) 60 MG/ML SOLN injection Inject 60 mg into the skin every 6 (six) months. Administer in upper arm, thigh, or abdomen    . donepezil (ARICEPT) 5 MG tablet 5 mg. "10 mgs split in half."    . ipratropium (ATROVENT) 0.03 % nasal spray Place 2 sprays into both nostrils every 12 (twelve) hours.    . metoprolol succinate (TOPROL-XL) 25 MG 24 hr tablet Take 0.5 tablets (12.5 mg total) by mouth daily with supper. 30 tablet 0  . nitroGLYCERIN (NITROSTAT) 0.4 MG SL tablet Place 1 tablet (0.4 mg total) under the tongue every  5 (five) minutes as needed for chest pain (x 3 pills). 25 tablet 3  . pantoprazole (PROTONIX) 40 MG tablet Take 1 tablet (40 mg total) by mouth daily. (Patient taking differently: Take 40 mg by mouth 2 (two) times daily. ) 30 tablet 0  . polyethylene glycol powder (MIRALAX) 17 GM/SCOOP powder Take 1 Container by mouth once.    . Polysaccharide Iron Complex (NU-IRON PO) Take by mouth.    . Probiotic Product (ALIGN) 4 MG CAPS Take 4 mg by mouth  daily.     . rosuvastatin (CRESTOR) 20 MG tablet Take 20 mg by mouth at bedtime.    . traZODone (DESYREL) 50 MG tablet TAKE 1 TABLET BY MOUTH  DAILY AT BEDTIME 90 tablet 3   No current facility-administered medications on file prior to visit.    Allergies:   Allergies  Allergen Reactions  . Buprenorphine Hcl Shortness Of Breath    Chest pain  . Codeine Nausea And Vomiting    ulcers  . Morphine And Related Shortness Of Breath    Chest pain, made me crazy  . Sulfacetamide Sodium Nausea And Vomiting  . Vicodin [Hydrocodone-Acetaminophen] Nausea And Vomiting  . Evista [Raloxifene] Other (See Comments) and Nausea And Vomiting    Upset stomach  . Hydromorphone Nausea And Vomiting    Sensitive  . Other Other (See Comments)    Narcotic-like medications - does NOT tolerate pain meds, causes unrelating volatile vomiting and drops in BP below 80/40 with dizziness and fainting  Tourniquet - Please place tourniquet over cloth - causes sever bruising.  Blood pressure cuff - please use manual blood pressure cuff - causes excoriating pain using machine and bruising     . Sulfur Nausea And Vomiting    Vomited until bleeding occurred and developed an active stomach ulcer  . Tape Other (See Comments)    Rips skin  . Vistaril [Hydroxyzine Hcl] Other (See Comments)    Confusion, hallucinations    Physical Exam  Today's Vitals   01/29/20 1545  BP: 131/66  Pulse: (!) 56  Temp: 97.7 F (36.5 C)  Weight: 154 lb (69.9 kg)  Height: 5\' 2"  (1.575 m)   Body mass index is 28.17 kg/m.  General: cachectic frail very pleasant elderly Caucasian lady, seated, in no evident distress Head: head normocephalic and atraumatic.  Head and neck deviated to the right Neck: supple with no carotid or supraclavicular bruits Cardiovascular: regular rate and rhythm, no murmurs Musculoskeletal: no deformity except marked kyphosis and slight scoliosis to the right Skin:  no rash/petichiae Vascular:  Normal  pulses all extremities, 2+ pitting edema bilaterally  Neurologic Exam Mental Status: Awake and fully alert. Oriented to place and time. Recent and remote memory diminished. Attention span, concentration and fund of knowledge diminished. Mood and affect appropriate.   Cranial Nerves: Pupils equal, briskly reactive to light. Extraocular movements full without nystagmus. Visual fields full to confrontation and able to read and write quite well. Hearing diminished bilaterally. Facial sensation intact.mild left lower facial asymmetry., tongue, palate moves normally and symmetrically.  Motor: Generalized weakness noted in all extremities (most likely related to deconditioning) but greater on the right with right foot drop and right grip and hip flexor weakness Sensory.: intact to touch ,pinprick .position and vibratory sensation.  Coordination: Slow but slightly impaired on the right compared to the left  Gait and Station: Stands from seated position with 2 person assist with slightly hunched position leaning towards right.  Gait assessment deferred as rolling  walker not present during visit reflexes: 2+ and asymmetric and brisker on the right. Toes downgoing.    ASSESSMENT: 86 year old patient with right MCA branch infarct in December 2018 of cryptogenic etiology with residual mild left hemiparesis.  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  She has gained great benefit with use of low-dose Sinemet, Aricept and trazodone.  PLAN: -Continuation of Sinemet 0.5 tablets 4 times daily -discussion regarding recent worsening of vivid dreams and visual hallucinations.  Advised daughter that further discussion with Dr. Leonie [name] will be needed to see if additional treatment options are recommended at this time.  Also encouraged follow-up with  geriatric psychiatry 3/4  -Continue Aricept 2.5mg  daily as her visual hallucinations appear to have improved and she appears cognitively quite well preserved.   -Ongoing concern regarding nasal drip likely due to perseverating with underlying cognitive impairment  -Continue Plavix and Crestor for secondary stroke prevention  Follow-up in 6 months or call earlier if needed  Greater than 50% of time during this 30-minute visit was spent on counseling,explanation of diagnosis of peduncular hallucinations, parkinson`s plus, gait apraxia and stroke with ongoing benefit of low-dose Sinemet and Aricept, discussion regarding slight worsening of symptoms, reviewing risk factor management of HLD and HTN, planning of further management, discussion with patient and family and coordination of care   Frann Rider, [facility]  [facility] [address] [place], [place] [address]  Phone [phone] Fax [phone] Note: This document was prepared with digital dictation and possible smart phrase technology. Any transcriptional errors that result from this process are unintentional.

## 2020-01-31 NOTE — Progress Notes (Signed)
I agree with the above plan 

## 2020-02-06 ENCOUNTER — Ambulatory Visit (HOSPITAL_COMMUNITY): Payer: Medicare Other | Admitting: Psychiatry

## 2020-02-12 ENCOUNTER — Encounter: Payer: Self-pay | Admitting: Internal Medicine

## 2020-02-12 ENCOUNTER — Other Ambulatory Visit: Payer: Medicare Other | Admitting: Internal Medicine

## 2020-02-12 ENCOUNTER — Other Ambulatory Visit: Payer: Self-pay

## 2020-02-12 DIAGNOSIS — Z515 Encounter for palliative care: Secondary | ICD-10-CM

## 2020-02-12 DIAGNOSIS — Z7189 Other specified counseling: Secondary | ICD-10-CM

## 2020-02-12 NOTE — Progress Notes (Signed)
Jan 12th, 2020 [place] Consult Note Telephone: [phone]  Fax: [phone]  Due to the current COVID-19 infection/crises, the family prefer, and have given their verbal consent for, a provider visit via telemedicine. HIPPA policies of confidentially were discussed. Video-audio (telehealth) contact was unable to be done due technical barriers from the patient's side.   PATIENT NAME: Molly [name] DOB: [birth year] MRN: [id number]  PRIMARY CARE PROVIDER:   [name], MD [address] [place], [place] [address]   REFERRING PROVIDER: Dr. Antony Contras (Neurology).  Dr. Norma Fredrickson (Psych) Dr. Larae Grooms (Cardiology) Dr. Antony Contras (Neurology)   RESPONSIBLE PARTY: *(dtr) Molly [name] [phone] e-mail: [email or web address]. (dtr) Ms Ishmael Holter, e-mail:  [email or web address]    1. Advance Care Planning: A. Advanced Care Directives: DNR/MOST form in [place] and in home. MOST: DNR/DNI Scope of Medical Interventions: Comfort measures. Antibiotics: Determine [place] or limitations at time of infection. No to IVFs, no tube feedings.  B. Goals of Care: Maintain strength and function. Optimize mobility as best she can.  2. Symptom management/Functional Status:  Past hallucinations had improved with initiation of Sinemet and los dose Aricept. Unfortunately, over the last few months these hallucinations have been creeping back into patient's life. Currently, manifest as a awakening up from very vivid dreams, with visual hallucinations of bugs crawling on her. Luckily, these are not bothersome/scary to patient. Continues drippy nose without much improvement with nasal Atrovent or Mucinex, or with past use of antihistamines (Claritin) and nasal Flonase. Sensation of "grapes with stems" in her nose. Recent OV with neurologist PA, who is planning to run case by Dr. Leonie Man for further suggestions, if any. Patient has a f/u with  Dr. Norma Fredrickson (psych) coming up this Thursday and hoping he may have some recommendations. Molly [name] mentions hallucinations seem to have no relationship to the timing/taking of her Sinemet or Aricept.   Patient continues able to self-transfer from her lift chair. Able to ambulate with rollator walker/one-person steadying guidance. She had a near fall while standing brushing her teeth a few weeks ago (knees buckled/weakness); fall aborted by her caregiver who eased her to a chair. Patient with episodic weak spells. She continues RUE tremors. Continues good appetite; weight stable at 153 lbs. At a height of 5'1" her BMI is 28.9 kg/m2. She did receive her COVID shot.   -suggested that caregivers check patient's BP/HR at times of weak spells to see if associated with hypotension or tachy/bradycardia.  3. Family supports: (reviewed; as per last note) Daughter Molly [name] is PCG. She has physical ailments that can make physical care of patient taxing. Molly [name] has hired 3-4 part time aides to assist with her mom's care. PT working with patient.               4. F/U: Tue 03/18/2020 @ 2pm   I spent 30 minutes providing this consultation, from 2 pm to 3 pm. More than 50% of the time in this consultation was spent coordinating communication.    HISTORY OF PRESENT ILLNESS:  Molly [name] is an 86 y.o. female with medical h/o CAD, HLD, HTN, auditory/tactile hallucinations, Parkinson's plus syndrome,  and osteoporosis. This is a f/u Palliative Care visit from 12/25/2019.   CODE STATUS: DNR   PPS:  40%    HOSPICE ELIGIBILITY/DIAGNOSIS: TBD   PAST MEDICAL HISTORY:  Past Medical History:  Diagnosis Date  . Acid reflux   . Anemia   . Arthritis   . CAD (coronary  artery disease)   . High cholesterol   . Hyperlipidemia   . Hypertension   . Osteoporosis   . Spinal stenosis   . Stroke (cerebrum) ([facility]) 12/07/2017    SOCIAL HX:  Social History   Tobacco Use  . Smoking status: Never Smoker  . Smokeless  tobacco: Never Used  Substance Use Topics  . Alcohol use: No    Alcohol/week: 0.0 standard drinks    ALLERGIES:  Allergies  Allergen Reactions  . Buprenorphine Hcl Shortness Of Breath    Chest pain  . Codeine Nausea And Vomiting    ulcers  . Morphine And Related Shortness Of Breath    Chest pain, made me crazy  . Sulfacetamide Sodium Nausea And Vomiting  . Vicodin [Hydrocodone-Acetaminophen] Nausea And Vomiting  . Evista [Raloxifene] Other (See Comments) and Nausea And Vomiting    Upset stomach  . Hydromorphone Nausea And Vomiting    Sensitive  . Other Other (See Comments)    Narcotic-like medications - does NOT tolerate pain meds, causes unrelating volatile vomiting and drops in BP below 80/40 with dizziness and fainting  Tourniquet - Please place tourniquet over cloth - causes sever bruising.  Blood pressure cuff - please use manual blood pressure cuff - causes excoriating pain using machine and bruising     . Sulfur Nausea And Vomiting    Vomited until bleeding occurred and developed an active stomach ulcer  . Tape Other (See Comments)    Rips skin  . Vistaril [Hydroxyzine Hcl] Other (See Comments)    Confusion, hallucinations     PERTINENT MEDICATIONS:  Outpatient Encounter Medications as of 02/12/2020  Medication Sig  . acetaminophen (TYLENOL) 500 MG tablet Take 500 mg by mouth every 8 (eight) hours as needed for mild pain.   . Boswellia-Glucosamine-Vit D (OSTEO BI-FLEX ONE PER DAY PO) Take 2,000 Units by mouth 2 (two) times daily.  . calcium-vitamin D (OSCAL WITH D) 500-200 MG-UNIT tablet Take 1 tablet by mouth 2 (two) times daily.  . carbidopa-levodopa (SINEMET) 25-100 MG tablet Take 0.5 tablets by mouth 4 (four) times daily.  . citalopram (CELEXA) 10 MG tablet Take 1 tablet (10 mg total) by mouth daily.  . clopidogrel (PLAVIX) 75 MG tablet TAKE 1 TABLET BY MOUTH EVERY DAY  . denosumab (PROLIA) 60 MG/ML SOLN injection Inject 60 mg into the skin every 6 (six) months.  Administer in upper arm, thigh, or abdomen  . donepezil (ARICEPT) 5 MG tablet 5 mg. "10 mgs split in half."  . ipratropium (ATROVENT) 0.03 % nasal spray Place 2 sprays into both nostrils every 12 (twelve) hours.  . metoprolol succinate (TOPROL-XL) 25 MG 24 hr tablet Take 0.5 tablets (12.5 mg total) by mouth daily with supper.  . nitroGLYCERIN (NITROSTAT) 0.4 MG SL tablet Place 1 tablet (0.4 mg total) under the tongue every 5 (five) minutes as needed for chest pain (x 3 pills).  . pantoprazole (PROTONIX) 40 MG tablet Take 1 tablet (40 mg total) by mouth daily. (Patient taking differently: Take 40 mg by mouth 2 (two) times daily. )  . polyethylene glycol powder (MIRALAX) 17 GM/SCOOP powder Take 1 Container by mouth once.  . Polysaccharide Iron Complex (NU-IRON PO) Take by mouth.  . Probiotic Product (ALIGN) 4 MG CAPS Take 4 mg by mouth daily.   . rosuvastatin (CRESTOR) 20 MG tablet Take 20 mg by mouth at bedtime.  . traZODone (DESYREL) 50 MG tablet TAKE 1 TABLET BY MOUTH  DAILY AT BEDTIME   No facility-administered  encounter medications on file as of 02/12/2020.    PHYSICAL EXAM:   PE deferred d/t audio only nature of telehealth   Julianne Handler, NP

## 2020-02-14 ENCOUNTER — Ambulatory Visit (INDEPENDENT_AMBULATORY_CARE_PROVIDER_SITE_OTHER): Payer: Medicare Other | Admitting: Psychiatry

## 2020-02-14 ENCOUNTER — Other Ambulatory Visit: Payer: Self-pay

## 2020-02-14 DIAGNOSIS — F329 Major depressive disorder, single episode, unspecified: Secondary | ICD-10-CM | POA: Diagnosis not present

## 2020-02-14 DIAGNOSIS — F0153 Vascular dementia, unspecified severity, with mood disturbance: Secondary | ICD-10-CM

## 2020-02-14 DIAGNOSIS — F0150 Vascular dementia without behavioral disturbance: Secondary | ICD-10-CM

## 2020-02-14 MED ORDER — CITALOPRAM HYDROBROMIDE 10 MG PO TABS: 10.0000 mg | ORAL_TABLET | Freq: Every day | ORAL | 2 refills | Status: DC

## 2020-02-14 NOTE — Progress Notes (Signed)
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  She does not appear depressed at all.  Today we spoke with [name] who she lives with.  [name] says the only issues are that she wakes up and sometimes is dreaming of some uncomfortable dreams.  Often she will wake up the next day anxious as she has had bad dreams.  Patient also has sensations of things like feeling of sand in her hair or cough something coming out of her ear.  These are minor sensations that she has they really do not cause her all that much distress.  I think they are annoying.  She denies auditory or visual hallucinations.  She is not paranoid.  She feels generally happy with life.  She needs assistance dressing because she is weak.  And she can feed herself as long as her food is cut up.  Overall the patient is functioning fairly well with lots of assistance from her family.  I believe her memory slowly continues to decline.  She continues under the care of a neurologist.  Family says that overall her health is stable.  The patient drinks no alcohol. Associated Signs/Symptoms: Depression Symptoms:   (Hypo) Manic Symptoms:   Anxiety Symptoms:   Psychotic Symptoms:  Hallucinations: Visual PTSD Symptoms:   Past Psychiatric History: Seroquel 100 mg  Previous Psychotropic Medications:   Substance Abuse History in the last 12 months:    Consequences of Substance Abuse:   Past Medical History:  Past Medical History:  Diagnosis Date  . Acid reflux   . Anemia   . Arthritis   . CAD (coronary artery disease)   . High cholesterol   . Hyperlipidemia   . Hypertension   . Osteoporosis   . Spinal stenosis   . Stroke (cerebrum) ([facility]) 12/07/2017    Past Surgical History:  Procedure Laterality Date  . ABDOMINAL  HYSTERECTOMY    . APPENDECTOMY    . BACK SURGERY     Rod and Pins Placed   . CARDIAC CATHETERIZATION N/A 11/17/2015   Procedure: Left Heart Cath and Coronary Angiography;  Surgeon: Jettie Booze, MD;  Location: [facility];  Service: Cardiovascular;  Laterality: N/A;  . CAROTID STENT    . UPPER GI ENDOSCOPY      Family Psychiatric History:   Family History:  Family History  Problem Relation Age of Onset  . Anemia Mother   . Heart attack Brother     Social History:   Social History   Socioeconomic History  . Marital status: Widowed    Spouse name: Not on file  . Number of children: Not on file  . Years of education: Not on file  . Highest education level: Not on file  Occupational History  . Not on file  Tobacco Use  . Smoking status: Never Smoker  . Smokeless tobacco: Never Used  Substance and Sexual Activity  . Alcohol use: No    Alcohol/week: 0.0 standard drinks  . Drug use: No  . Sexual activity: Not Currently  Other Topics Concern  . Not on file  Social History Narrative  . Not on file   Social Determinants of Health   Financial Resource Strain:   . Difficulty of Paying Living Expenses: Not on file  Food Insecurity:   . Worried  About Running Out of Food in the Last Year: Not on file  . Ran Out of Food in the Last Year: Not on file  Transportation Needs:   . Lack of Transportation (Medical): Not on file  . Lack of Transportation (Non-Medical): Not on file  Physical Activity:   . Days of Exercise per Week: Not on file  . Minutes of Exercise per Session: Not on file  Stress:   . Feeling of Stress : Not on file  Social Connections:   . Frequency of Communication with Friends and Family: Not on file  . Frequency of Social Gatherings with Friends and Family: Not on file  . Attends Religious Services: Not on file  . Active Member of Clubs or Organizations: Not on file  . Attends Archivist Meetings: Not on file  . Marital Status: Not  on file    Additional Social History:   Allergies:   Allergies  Allergen Reactions  . Buprenorphine Hcl Shortness Of Breath    Chest pain  . Codeine Nausea And Vomiting    ulcers  . Morphine And Related Shortness Of Breath    Chest pain, made me crazy  . Sulfacetamide Sodium Nausea And Vomiting  . Vicodin [Hydrocodone-Acetaminophen] Nausea And Vomiting  . Evista [Raloxifene] Other (See Comments) and Nausea And Vomiting    Upset stomach  . Hydromorphone Nausea And Vomiting    Sensitive  . Other Other (See Comments)    Narcotic-like medications - does NOT tolerate pain meds, causes unrelating volatile vomiting and drops in BP below 80/40 with dizziness and fainting  Tourniquet - Please place tourniquet over cloth - causes sever bruising.  Blood pressure cuff - please use manual blood pressure cuff - causes excoriating pain using machine and bruising     . Sulfur Nausea And Vomiting    Vomited until bleeding occurred and developed an active stomach ulcer  . Tape Other (See Comments)    Rips skin  . Vistaril [Hydroxyzine Hcl] Other (See Comments)    Confusion, hallucinations    Metabolic Disorder Labs: Lab Results  Component Value Date   HGBA1C 5.7 (H) 12/08/2017   MPG 116.89 12/08/2017   No results found for: PROLACTIN Lab Results  Component Value Date   CHOL 133 12/08/2017   TRIG 98 12/08/2017   HDL 63 12/08/2017   CHOLHDL 2.1 12/08/2017   VLDL 20 12/08/2017   LDLCALC 50 12/08/2017   LDLCALC 87 11/18/2014   Lab Results  Component Value Date   TSH 3.010 01/23/2019    Therapeutic Level Labs: No results found for: LITHIUM No results found for: CBMZ No results found for: VALPROATE  Current Medications: Current Outpatient Medications  Medication Sig Dispense Refill  . acetaminophen (TYLENOL) 500 MG tablet Take 500 mg by mouth every 8 (eight) hours as needed for mild pain.     . Boswellia-Glucosamine-Vit D (OSTEO BI-FLEX ONE PER DAY PO) Take 2,000 Units by  mouth 2 (two) times daily.    . calcium-vitamin D (OSCAL WITH D) 500-200 MG-UNIT tablet Take 1 tablet by mouth 2 (two) times daily. 120 tablet 0  . carbidopa-levodopa (SINEMET) 25-100 MG tablet Take 0.5 tablets by mouth 4 (four) times daily. 360 tablet 1  . citalopram (CELEXA) 10 MG tablet Take 1 tablet (10 mg total) by mouth daily. 90 tablet 2  . clopidogrel (PLAVIX) 75 MG tablet TAKE 1 TABLET BY MOUTH EVERY DAY 90 tablet 0  . denosumab (PROLIA) 60 MG/ML SOLN injection Inject 60 mg  into the skin every 6 (six) months. Administer in upper arm, thigh, or abdomen    . donepezil (ARICEPT) 5 MG tablet 5 mg. "10 mgs split in half."    . ipratropium (ATROVENT) 0.03 % nasal spray Place 2 sprays into both nostrils every 12 (twelve) hours.    . metoprolol succinate (TOPROL-XL) 25 MG 24 hr tablet Take 0.5 tablets (12.5 mg total) by mouth daily with supper. 30 tablet 0  . nitroGLYCERIN (NITROSTAT) 0.4 MG SL tablet Place 1 tablet (0.4 mg total) under the tongue every 5 (five) minutes as needed for chest pain (x 3 pills). 25 tablet 3  . pantoprazole (PROTONIX) 40 MG tablet Take 1 tablet (40 mg total) by mouth daily. (Patient taking differently: Take 40 mg by mouth 2 (two) times daily. ) 30 tablet 0  . polyethylene glycol powder (MIRALAX) 17 GM/SCOOP powder Take 1 Container by mouth once.    . Polysaccharide Iron Complex (NU-IRON PO) Take by mouth.    . Probiotic Product (ALIGN) 4 MG CAPS Take 4 mg by mouth daily.     . rosuvastatin (CRESTOR) 20 MG tablet Take 20 mg by mouth at bedtime.    . traZODone (DESYREL) 50 MG tablet TAKE 1 TABLET BY MOUTH  DAILY AT BEDTIME 90 tablet 3   No current facility-administered medications for this visit.    Musculoskeletal: Strength & Muscle Tone: decreased Gait & Station: unable to stand Patient leans: N/A  Psychiatric Specialty Exam: ROS  There were no vitals taken for this visit.There is no height or weight on file to calculate BMI.  General Appearance: Casual  Eye  Contact:  Good  Speech:  Normal Rate  Volume:  Normal  Mood:  Euthymic  Affect:  Appropriate  Thought Process:  Goal Directed  Orientation:  NA  Thought Content:  Hallucinations: Visual  Suicidal Thoughts:  No  Homicidal Thoughts:  No  Memory:    Judgement:  Good  Insight:  Fair  Psychomotor Activity:  Decreased  Concentration:    Recall:  Flasher of Knowledge:Fair  Language: Poor  Akathisia:  No  Handed:  Right  AIMS (if indicated):  not done  Assets:  Desire for Improvement  ADL's:  Impaired  Cognition: WNL  Sleep:  Fair   Screenings: Mini-Mental     Office Visit from 06/12/2019 in [facility]  Total Score (max 30 points )  24    PHQ2-9     [facility]  PHQ-2 Total Score  1  PHQ-9 Total Score  2      Assessment and Plan:   At this time her 1st and only psychiatric condition is that of clinical depression.  She takes Celexa 10 mg.  Overall it is defined as being helpful for her.  Her mood is stable.  She is still able to enjoy some things about life.  The possibility of changing her prescriber to her primary care doctor should be considered at her next visit.  She will return to see me in 7 months.  We will continue her Celexa for the time being.  Jerral Ralph, MD 3/4/20213:56 PM

## 2020-03-18 ENCOUNTER — Other Ambulatory Visit: Payer: Medicare Other | Admitting: Internal Medicine

## 2020-03-18 ENCOUNTER — Other Ambulatory Visit: Payer: Self-pay

## 2020-03-18 DIAGNOSIS — Z515 Encounter for palliative care: Secondary | ICD-10-CM

## 2020-03-18 DIAGNOSIS — Z7189 Other specified counseling: Secondary | ICD-10-CM

## 2020-03-18 NOTE — Progress Notes (Signed)
April 6th, 2020 [facility] Palliative Care Consult Note Telephone: [phone]  Fax: [phone]  Due to the current COVID-19 infection/crises, the family prefer, and have given their verbal consent for, a provider visit via telemedicine. HIPPA policies of confidentially were discussed. Video-audio (telehealth) contact was unable to be done due technical barriers from the patient's side.  PATIENT NAME:Molly [name] DOB:January 26, 1929 [id number]  PRIMARY CARE PROVIDER:Griffin, Margaretha Sheffield, MD [address] [place], [place] [address]  REFERRING PROVIDER:Dr. Antony Contras (Neurology).  Dr. Norma Fredrickson (Psych) Dr. Larae Grooms (Cardiology) Dr. Antony Contras (Neurology)  RESPONSIBLE PARTY:*(dtr) Allene Dillon [phone] e-mail: [email or web address]. (dtr) Ms Ishmael Holter, e-mail: [email or web address]  1.Advance Care Planning: A.Advanced Care Directives:DNR/MOST form in [place] [employer] and in home. MOST: DNR/DNI Scope of Medical Interventions: Comfort measures. Antibiotics: Determine [place] or limitations at time of infection. No to IVFs, no tube feedings. B. Goals of Care: Maintain strength and function. Optimize mobility as best she can. Try to control symptoms of tactile hallucinations as able. Since Jan on 5mg  (1/2 of 10) at bedtime. 2. Symptom management/Functional Status:  Patient continues with distressing sensations of feeling items in her nose  (also drippy nose), so that she is constantly wiping her nose causing soreness. Also now feeling that the skin on her face is coming off in her hands, as she wipes her face. She is aware that this is not really happening, but none the less has the sensations. PCP has initiated gabapentin towards treating these symptoms. Patient has has improvement of LE neuropathy symptoms (pain/burning) with this, but not of the tactile hallucinations. Hallucinations seem to have no relationship  to the timing/taking of her Sinemet or Aricept. Past use of antihistamines, nasal Flonase, nasal Atrovent or Mucinex without relief of drippy nose. Patient continues with disturbing dreams and nightmares. Again, realizes these are not real.  In the past, patient has improvement of those nightmares with initiation of Aricept, which she currently takes qam.  -consider taking Aricept in the evenings rather than hs, may help with nightmares.  -Nuplazid was suggested in the past by neurologist, but cost prohibitive (about 7k/month).   -Family considering referral (neuropsych) for other possible suggestions symptom management  Patient continues able to self-transfer from her lift chair. Able to ambulate with rollator walker/one-person steadying guidance. She enjoys playing her Wi with the family.  3.Family supports: (reviewed; as per last note) Daughter [name] is PCG. She has physical ailments that can make physical care of patient taxing. [name] has hired 3-4 part time aides to assist with her mom's care.   4. F/U: Daughter [name] will call me on a prn basis; she has my contact information  I spent89minutes providing this consultation, from2 pmto3 pm. More than 50% of the time in this consultation was spent coordinating communication.   HISTORY OF PRESENT ILLNESS:Hanae Foglemanis [id number].o.femalewith medical h/o CAD, HLD, HTN, auditory/tactile hallucinations, Parkinson's plus syndrome,  and osteoporosis.This is a f/uPalliative Carevisit from3/01/2020.  CODE STATUS:DNR  PPS:40%   HOSPICE ELIGIBILITY/DIAGNOSIS:TBD  PAST MEDICAL HISTORY:  Past Medical History:  Diagnosis Date  . Acid reflux   . Anemia   . Arthritis   . CAD (coronary artery disease)   . High cholesterol   . Hyperlipidemia   . Hypertension   . Osteoporosis   . Spinal stenosis   . Stroke (cerebrum) ([place]) 12/07/2017    SOCIAL HX:  Social History   Tobacco Use  . Smoking status:  Never Smoker  . Smokeless tobacco: Never  Used  Substance Use Topics  . Alcohol use: No    Alcohol/week: 0.0 standard drinks    ALLERGIES:  Allergies  Allergen Reactions  . Buprenorphine Hcl Shortness Of Breath    Chest pain  . Codeine Nausea And Vomiting    ulcers  . Morphine And Related Shortness Of Breath    Chest pain, made me crazy  . Sulfacetamide Sodium Nausea And Vomiting  . Vicodin [Hydrocodone-Acetaminophen] Nausea And Vomiting  . Evista [Raloxifene] Other (See Comments) and Nausea And Vomiting    Upset stomach  . Hydromorphone Nausea And Vomiting    Sensitive  . Other Other (See Comments)    Narcotic-like medications - does NOT tolerate pain meds, causes unrelating volatile vomiting and drops in BP below 80/40 with dizziness and fainting  Tourniquet - Please place tourniquet over cloth - causes sever bruising.  Blood pressure cuff - please use manual blood pressure cuff - causes excoriating pain using machine and bruising     . Sulfur Nausea And Vomiting    Vomited until bleeding occurred and developed an active stomach ulcer  . Tape Other (See Comments)    Rips skin  . Vistaril [Hydroxyzine Hcl] Other (See Comments)    Confusion, hallucinations     PERTINENT MEDICATIONS:  Outpatient Encounter Medications as of 03/18/2020  Medication Sig  . acetaminophen (TYLENOL) 500 MG tablet Take 500 mg by mouth every 8 (eight) hours as needed for mild pain.   . Boswellia-Glucosamine-Vit D (OSTEO BI-FLEX ONE PER DAY PO) Take 2,000 Units by mouth 2 (two) times daily.  . calcium-vitamin D (OSCAL WITH D) 500-200 MG-UNIT tablet Take 1 tablet by mouth 2 (two) times daily.  . carbidopa-levodopa (SINEMET) 25-100 MG tablet Take 0.5 tablets by mouth 4 (four) times daily.  . citalopram (CELEXA) 10 MG tablet Take 1 tablet (10 mg total) by mouth daily.  . clopidogrel (PLAVIX) 75 MG tablet TAKE 1 TABLET BY MOUTH EVERY DAY  . denosumab (PROLIA) 60 MG/ML SOLN injection Inject 60 mg into the  skin every 6 (six) months. Administer in upper arm, thigh, or abdomen  . donepezil (ARICEPT) 5 MG tablet 5 mg. "10 mgs split in half."  . ipratropium (ATROVENT) 0.03 % nasal spray Place 2 sprays into both nostrils every 12 (twelve) hours.  . metoprolol succinate (TOPROL-XL) 25 MG 24 hr tablet Take 0.5 tablets (12.5 mg total) by mouth daily with supper.  . nitroGLYCERIN (NITROSTAT) 0.4 MG SL tablet Place 1 tablet (0.4 mg total) under the tongue every 5 (five) minutes as needed for chest pain (x 3 pills).  . pantoprazole (PROTONIX) 40 MG tablet Take 1 tablet (40 mg total) by mouth daily. (Patient taking differently: Take 40 mg by mouth 2 (two) times daily. )  . polyethylene glycol powder (MIRALAX) 17 GM/SCOOP powder Take 1 Container by mouth once.  . Polysaccharide Iron Complex (NU-IRON PO) Take by mouth.  . Probiotic Product (ALIGN) 4 MG CAPS Take 4 mg by mouth daily.   . rosuvastatin (CRESTOR) 20 MG tablet Take 20 mg by mouth at bedtime.  . traZODone (DESYREL) 50 MG tablet TAKE 1 TABLET BY MOUTH  DAILY AT BEDTIME   No facility-administered encounter medications on file as of 03/18/2020.    PHYSICAL EXAM:   PE deferred d/t audio only nature of telehealth   Julianne Handler, NP

## 2020-03-19 ENCOUNTER — Encounter: Payer: Self-pay | Admitting: Internal Medicine

## 2020-04-05 ENCOUNTER — Other Ambulatory Visit: Payer: Self-pay | Admitting: Neurology

## 2020-04-07 ENCOUNTER — Other Ambulatory Visit: Payer: Self-pay

## 2020-04-07 MED ORDER — DONEPEZIL HCL 5 MG PO TABS: 5.0000 mg | ORAL_TABLET | Freq: Every day | ORAL | 3 refills | Status: DC

## 2020-04-17 ENCOUNTER — Other Ambulatory Visit: Payer: Medicare Other | Admitting: Internal Medicine

## 2020-04-17 ENCOUNTER — Encounter: Payer: Self-pay | Admitting: Internal Medicine

## 2020-04-17 ENCOUNTER — Other Ambulatory Visit: Payer: Self-pay

## 2020-04-17 DIAGNOSIS — Z7189 Other specified counseling: Secondary | ICD-10-CM

## 2020-04-17 DIAGNOSIS — Z515 Encounter for palliative care: Secondary | ICD-10-CM

## 2020-04-17 NOTE — Progress Notes (Signed)
May 6th, 2020 [facility] Consult Note Telephone: [phone]  Fax: [phone]   PATIENT NAME: Molly [name] DOB: July 21, 1929 MRN: [id number]   PRIMARY CARE PROVIDER:   [name], MD [address] [employer] [place], [place] [address]   REFERRING PROVIDER: Dr. Antony Contras (Neurology).  Dr. Norma Fredrickson (Psych) Dr. Larae Grooms (Cardiology) Dr. Antony Contras (Neurology)   RESPONSIBLE PARTY: *(dtr) Allene Dillon [phone] e-mail: [email or web address]. (dtr) Ms Ishmael Holter, e-mail:  [email or web address]    1. Advance Care Planning: A. Advanced Care Directives: DNR/MOST form in [place] and in home. MOST: DNR/DNI Scope of Medical Interventions: Comfort measures. Antibiotics: Determine [place] or limitations at time of infection. No to IVFs, no tube feedings.  B. Goals of Care: Maintain strength and function. Optimize mobility as best she can. Try to control symptoms of tactile hallucinations as able.  2. Symptom management/Functional Status: (reviewed; unchanged) Continues with distressing sensations of feeling items in her nose (also drippy nose), so that she is constantly wiping her nose causing soreness. Also now feeling that the skin on her face is coming off in her hands, as she wipes her face. She is aware that this is not really happening, but none the less has the sensations. PCP has initiated gabapentin towards treating these symptoms. Some improvement of LE neuropathy symptoms (pain/burning) with this, but not of the tactile hallucinations. Hallucinations seem to have no relationship to the timing/taking of her Sinemet or Aricept. Past use of antihistamines, nasal Flonase, nasal Atrovent or Mucinex without relief of drippy nose.  Patient continues with disturbing dreams and nightmares. Again, realizes these are not real.  In the past, patient has improvement of those nightmares with initiation of Aricept. Notes  exacerbation of R UE intentional tremors.               -Nuplazid was suggested in the past by neurologist but cost prohibitive (about 7k/month). [             -Family planning to ask PCP Dr. Laurann Montana for referral to [facility] for second opinion/other possible treatment options.  Fax #: [phone].Phone # [phone]     Patient needing increased assistance to transfer. Able to ambulate with rollator walker/one-person steadying guidance. She enjoys playing her Wi with the family.   3. Family supports: (reviewed; as per last note) Daughter [name] is PCG. She has physical ailments that can make physical care of patient taxing. [name] has hired 3-4 part time aides to assist with her mom's care. She's having difficulty finding a good fit. Patient lives in one half of the home with her daughter [name] and [name]'s spouse. Patient's other daughter Danton Clap lives in the other part of the home.              4. F/U: Daughter [name] will call me on a prn basis; she has my contact information   I spent 60 minutes providing this consultation, from 2 pm to 3 pm. More than 50% of the time in this consultation was spent coordinating communication.    HISTORY OF PRESENT ILLNESS:  Molly [name] is an 86 y.o. female with medical h/o CAD, HLD, HTN, auditory/tactile hallucinations, Parkinson's plus syndrome, and osteoporosis. This is a f/u Palliative Care visit from 02/12/2020.   CODE STATUS: DNR   PPS:  weak 40%    HOSPICE ELIGIBILITY/DIAGNOSIS: TBD   PAST MEDICAL HISTORY:  Past Medical History:  Diagnosis Date  . Acid reflux   .  Anemia   . Arthritis   . CAD (coronary artery disease)   . High cholesterol   . Hyperlipidemia   . Hypertension   . Osteoporosis   . Spinal stenosis   . Stroke (cerebrum) ([place]) 12/07/2017    SOCIAL HX:  Social History   Tobacco Use  . Smoking status: Never Smoker  . Smokeless tobacco: Never Used  Substance Use Topics  . Alcohol use: No    Alcohol/week: 0.0  standard drinks    ALLERGIES:  Allergies  Allergen Reactions  . Buprenorphine Hcl Shortness Of Breath    Chest pain  . Codeine Nausea And Vomiting    ulcers  . Morphine And Related Shortness Of Breath    Chest pain, made me crazy  . Sulfacetamide Sodium Nausea And Vomiting  . Vicodin [Hydrocodone-Acetaminophen] Nausea And Vomiting  . Evista [Raloxifene] Other (See Comments) and Nausea And Vomiting    Upset stomach  . Hydromorphone Nausea And Vomiting    Sensitive  . Other Other (See Comments)    Narcotic-like medications - does NOT tolerate pain meds, causes unrelating volatile vomiting and drops in BP below 80/40 with dizziness and fainting  Tourniquet - Please place tourniquet over cloth - causes sever bruising.  Blood pressure cuff - please use manual blood pressure cuff - causes excoriating pain using machine and bruising     . Sulfur Nausea And Vomiting    Vomited until bleeding occurred and developed an active stomach ulcer  . Tape Other (See Comments)    Rips skin  . Vistaril [Hydroxyzine Hcl] Other (See Comments)    Confusion, hallucinations     PERTINENT MEDICATIONS:  Outpatient Encounter Medications as of 04/17/2020  Medication Sig  . acetaminophen (TYLENOL) 500 MG tablet Take 500 mg by mouth every 8 (eight) hours as needed for mild pain.   . Boswellia-Glucosamine-Vit D (OSTEO BI-FLEX ONE PER DAY PO) Take 2,000 Units by mouth 2 (two) times daily.  . calcium-vitamin D (OSCAL WITH D) 500-200 MG-UNIT tablet Take 1 tablet by mouth 2 (two) times daily.  . carbidopa-levodopa (SINEMET) 25-100 MG tablet Take 0.5 tablets by mouth 4 (four) times daily.  . citalopram (CELEXA) 10 MG tablet Take 1 tablet (10 mg total) by mouth daily.  . clopidogrel (PLAVIX) 75 MG tablet TAKE 1 TABLET BY MOUTH EVERY DAY  . denosumab (PROLIA) 60 MG/ML SOLN injection Inject 60 mg into the skin every 6 (six) months. Administer in upper arm, thigh, or abdomen  . donepezil (ARICEPT) 5 MG tablet Take 1  tablet (5 mg total) by mouth at bedtime. "10 mgs split in half."  . ipratropium (ATROVENT) 0.03 % nasal spray Place 2 sprays into both nostrils every 12 (twelve) hours.  . metoprolol succinate (TOPROL-XL) 25 MG 24 hr tablet Take 0.5 tablets (12.5 mg total) by mouth daily with supper.  . nitroGLYCERIN (NITROSTAT) 0.4 MG SL tablet Place 1 tablet (0.4 mg total) under the tongue every 5 (five) minutes as needed for chest pain (x 3 pills).  . pantoprazole (PROTONIX) 40 MG tablet Take 1 tablet (40 mg total) by mouth daily. (Patient taking differently: Take 40 mg by mouth 2 (two) times daily. )  . polyethylene glycol powder (MIRALAX) 17 GM/SCOOP powder Take 1 Container by mouth once.  . Polysaccharide Iron Complex (NU-IRON PO) Take by mouth.  . Probiotic Product (ALIGN) 4 MG CAPS Take 4 mg by mouth daily.   . rosuvastatin (CRESTOR) 20 MG tablet Take 20 mg by mouth at bedtime.  Marland Kitchen  traZODone (DESYREL) 50 MG tablet TAKE 1 TABLET BY MOUTH  DAILY AT BEDTIME   No facility-administered encounter medications on file as of 04/17/2020.    PHYSICAL EXAM:   Well nourished, elderly female sitting up in her recliner. Masked facies of Parkinson's. Sitting stiffly without much movement. Alert and oriented x 3. Her two daughters are in attendance. PE deferred to minimize COVID exposure. Neurological: Weakness   Julianne Handler, NP

## 2020-04-21 ENCOUNTER — Other Ambulatory Visit: Payer: Self-pay | Admitting: Neurology

## 2020-04-22 ENCOUNTER — Other Ambulatory Visit: Payer: Self-pay | Admitting: *Deleted

## 2020-04-22 MED ORDER — DONEPEZIL HCL 5 MG PO TABS: 5.0000 mg | ORAL_TABLET | Freq: Every day | ORAL | 3 refills | Status: DC

## 2020-05-01 ENCOUNTER — Telehealth: Payer: Self-pay | Admitting: *Deleted

## 2020-05-01 NOTE — Telephone Encounter (Signed)
Called daughter, [name] on [place], LVM requesting call back or my chart to discuss medication dose.

## 2020-05-07 ENCOUNTER — Encounter: Payer: Self-pay | Admitting: *Deleted

## 2020-05-07 NOTE — Telephone Encounter (Signed)
Sent my chart asking what dose Aricept patient is taking, 5 mg or 2.5 mg.

## 2020-05-08 NOTE — Telephone Encounter (Addendum)
Called [facility] home delivery, spoke with Aaron Edelman, pharmacist and advised him the patient is taking Aricept 5 mg, taking one tablet once daily. He verbalized understanding, appreciation.

## 2020-05-08 NOTE — Telephone Encounter (Signed)
Received my chart reply from patient. She is taking Aricept 5 mg once daily.

## 2020-05-25 ENCOUNTER — Other Ambulatory Visit: Payer: Self-pay | Admitting: Neurology

## 2020-05-25 ENCOUNTER — Other Ambulatory Visit: Payer: Self-pay | Admitting: Interventional Cardiology

## 2020-05-27 ENCOUNTER — Other Ambulatory Visit: Payer: Self-pay

## 2020-05-27 MED ORDER — CARBIDOPA-LEVODOPA 25-100 MG PO TABS
ORAL_TABLET | ORAL | 1 refills | Status: DC
Start: 2020-05-27 — End: 2020-10-15

## 2020-05-27 NOTE — Telephone Encounter (Signed)
I have reached out to the pt to discuss the pharmacy she wants to use since Dr. Leonie Man is changing the C/L dose.

## 2020-07-29 ENCOUNTER — Ambulatory Visit: Payer: Medicare Other | Admitting: Adult Health

## 2020-08-12 ENCOUNTER — Other Ambulatory Visit (HOSPITAL_COMMUNITY): Payer: Self-pay | Admitting: Psychiatry

## 2020-09-17 ENCOUNTER — Other Ambulatory Visit (HOSPITAL_COMMUNITY): Payer: Self-pay | Admitting: Psychiatry

## 2020-09-17 ENCOUNTER — Ambulatory Visit (INDEPENDENT_AMBULATORY_CARE_PROVIDER_SITE_OTHER): Payer: Medicare Other | Admitting: Psychiatry

## 2020-09-17 ENCOUNTER — Other Ambulatory Visit: Payer: Self-pay

## 2020-09-17 DIAGNOSIS — F32A Depression, unspecified: Secondary | ICD-10-CM | POA: Diagnosis not present

## 2020-09-17 DIAGNOSIS — F0150 Vascular dementia without behavioral disturbance: Secondary | ICD-10-CM | POA: Diagnosis not present

## 2020-09-17 DIAGNOSIS — F0153 Vascular dementia, unspecified severity, with mood disturbance: Secondary | ICD-10-CM

## 2020-09-17 MED ORDER — CITALOPRAM HYDROBROMIDE 10 MG PO TABS: 10.0000 mg | ORAL_TABLET | Freq: Every day | ORAL | 2 refills | Status: DC

## 2020-09-17 NOTE — Progress Notes (Signed)
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  Today we spoke with her daughter [name] most of the visit.  The patient is eating with assistance.  She continues to have strange sensations that sound like tactile hallucinations.  She cannot explain what exactly she is experiences but she feels things around her in her nose and other places.  At that time she seems to be confused and thinks that somebody is there.  She would not describe distinct auditory hallucinations and she certainly is not paranoid.  There is a lot of questions about her neurological illness.  She has an appointment in December to see a neurologist.  She is on Sinemet through the day.  1 can only wonder if that is not causing any hallucinations.  She will continue taking Celexa as prescribed.  She is difficult taking Klonopin in the past.  She generally is sleeping has no agitation and does not have periods where she is crying.  Her daughter is very invested in her. Associated Signs/Symptoms: Depression Symptoms:   (Hypo) Manic Symptoms:   Anxiety Symptoms:   Psychotic Symptoms:  Hallucinations: Visual PTSD Symptoms:   Past Psychiatric History: Seroquel 100 mg  Previous Psychotropic Medications:   Substance Abuse History in the last 12 months:    Consequences of Substance Abuse:   Past Medical History:  Past Medical History:  Diagnosis Date   Acid reflux    Anemia    Arthritis    CAD (coronary artery disease)    High cholesterol    Hyperlipidemia    Hypertension    Osteoporosis    Spinal stenosis    Stroke (cerebrum) ([place]) 12/07/2017    Past Surgical History:  Procedure Laterality Date   ABDOMINAL HYSTERECTOMY     APPENDECTOMY     BACK SURGERY     Rod  and Pins Placed    CARDIAC CATHETERIZATION N/A 11/17/2015   Procedure: Left Heart Cath and Coronary Angiography;  Surgeon: Jettie Booze, MD;  Location: [facility];  Service: Cardiovascular;  Laterality: N/A;   CAROTID STENT     UPPER GI ENDOSCOPY      Family Psychiatric History:   Family History:  Family History  Problem Relation Age of Onset   Anemia Mother    Heart attack Brother     Social History:   Social History   Socioeconomic History   Marital status: Widowed    Spouse name: Not on file   Number of children: Not on file   Years of education: Not on file   Highest education level: Not on file  Occupational History   Not on file  Tobacco Use   Smoking status: Never Smoker   Smokeless tobacco: Never Used  Vaping Use   Vaping Use: Never used  Substance and Sexual Activity   Alcohol use: No    Alcohol/week: 0.0 standard drinks   Drug use: No   Sexual activity: Not Currently  Other Topics Concern   Not on file  Social History Narrative   Not on file   Social Determinants of Health   Financial Resource Strain:    Difficulty of Paying Living Expenses: Not on file  Food Insecurity:    Worried About [place] in the Last Year: Not  on file   [place] in the Last Year: Not on file  Transportation Needs:    Lack of Transportation (Medical): Not on file   Lack of Transportation (Non-Medical): Not on file  Physical Activity:    Days of Exercise per Week: Not on file   Minutes of Exercise per Session: Not on file  Stress:    Feeling of Stress : Not on file  Social Connections:    Frequency of Communication with Friends and Family: Not on file   Frequency of Social Gatherings with Friends and Family: Not on file   Attends Religious Services: Not on file   Active Member of Clubs or Organizations: Not on file   Attends Archivist Meetings: Not on file   Marital Status: Not on file     Additional Social History:   Allergies:   Allergies  Allergen Reactions   Buprenorphine Hcl Shortness Of Breath    Chest pain   Codeine Nausea And Vomiting    ulcers   Morphine And Related Shortness Of Breath    Chest pain, made me crazy   Sulfacetamide Sodium Nausea And Vomiting   Vicodin [Hydrocodone-Acetaminophen] Nausea And Vomiting   Evista [Raloxifene] Other (See Comments) and Nausea And Vomiting    Upset stomach   Hydromorphone Nausea And Vomiting    Sensitive   Other Other (See Comments)    Narcotic-like medications - does NOT tolerate pain meds, causes unrelating volatile vomiting and drops in BP below 80/40 with dizziness and fainting  Tourniquet - Please place tourniquet over cloth - causes sever bruising.  Blood pressure cuff - please use manual blood pressure cuff - causes excoriating pain using machine and bruising      Sulfur Nausea And Vomiting    Vomited until bleeding occurred and developed an active stomach ulcer   Tape Other (See Comments)    Rips skin   Vistaril [Hydroxyzine Hcl] Other (See Comments)    Confusion, hallucinations    Metabolic Disorder Labs: Lab Results  Component Value Date   HGBA1C 5.7 (H) 12/08/2017   MPG 116.89 12/08/2017   No results found for: PROLACTIN Lab Results  Component Value Date   CHOL 133 12/08/2017   TRIG 98 12/08/2017   HDL 63 12/08/2017   CHOLHDL 2.1 12/08/2017   VLDL 20 12/08/2017   LDLCALC 50 12/08/2017   LDLCALC 87 11/18/2014   Lab Results  Component Value Date   TSH 3.010 01/23/2019    Therapeutic Level Labs: No results found for: LITHIUM No results found for: CBMZ No results found for: VALPROATE  Current Medications: Current Outpatient Medications  Medication Sig Dispense Refill   acetaminophen (TYLENOL) 500 MG tablet Take 500 mg by mouth every 8 (eight) hours as needed for mild pain.      Boswellia-Glucosamine-Vit D (OSTEO BI-FLEX ONE PER DAY PO) Take 2,000 Units by mouth 2  (two) times daily.     calcium-vitamin D (OSCAL WITH D) 500-200 MG-UNIT tablet Take 1 tablet by mouth 2 (two) times daily. 120 tablet 0   carbidopa-levodopa (SINEMET) 25-100 MG tablet Take 0.5 tablets by mouth 4 (four) times daily. 360 tablet 1   carbidopa-levodopa (SINEMET) 25-100 MG tablet Take 1.5 tablet and 1.0 tablet alternating four times daily. 450 tablet 1   citalopram (CELEXA) 10 MG tablet Take 1 tablet (10 mg total) by mouth daily. 90 tablet 2   clopidogrel (PLAVIX) 75 MG tablet TAKE 1 TABLET BY MOUTH EVERY DAY 90 tablet 0  denosumab (PROLIA) 60 MG/ML SOLN injection Inject 60 mg into the skin every 6 (six) months. Administer in upper arm, thigh, or abdomen     donepezil (ARICEPT) 5 MG tablet Take 1 tablet (5 mg total) by mouth at bedtime. "10 mgs split in half." 90 tablet 3   ipratropium (ATROVENT) 0.03 % nasal spray Place 2 sprays into both nostrils every 12 (twelve) hours.     metoprolol succinate (TOPROL-XL) 25 MG 24 hr tablet Take 0.5 tablets (12.5 mg total) by mouth daily with supper. 30 tablet 0   nitroGLYCERIN (NITROSTAT) 0.4 MG SL tablet Place 1 tablet (0.4 mg total) under the tongue every 5 (five) minutes as needed for chest pain (x 3 pills). 25 tablet 3   pantoprazole (PROTONIX) 40 MG tablet Take 1 tablet (40 mg total) by mouth daily. (Patient taking differently: Take 40 mg by mouth 2 (two) times daily. ) 30 tablet 0   polyethylene glycol powder (MIRALAX) 17 GM/SCOOP powder Take 1 Container by mouth once.     Polysaccharide Iron Complex (NU-IRON PO) Take by mouth.     Probiotic Product (ALIGN) 4 MG CAPS Take 4 mg by mouth daily.      rosuvastatin (CRESTOR) 20 MG tablet Take 20 mg by mouth at bedtime.     traZODone (DESYREL) 50 MG tablet TAKE 1 TABLET BY MOUTH  DAILY AT BEDTIME 90 tablet 3   No current facility-administered medications for this visit.    Musculoskeletal: Strength & Muscle Tone: decreased Gait & Station: unable to stand Patient leans:  N/A  Psychiatric Specialty Exam: ROS  There were no vitals taken for this visit.There is no height or weight on file to calculate BMI.  General Appearance: Casual  Eye Contact:  Good  Speech:  Normal Rate  Volume:  Normal  Mood:  Euthymic  Affect:  Appropriate  Thought Process:  Goal Directed  Orientation:  NA  Thought Content:  Hallucinations: Visual  Suicidal Thoughts:  No  Homicidal Thoughts:  No  Memory:    Judgement:  Good  Insight:  Fair  Psychomotor Activity:  Decreased  Concentration:    Recall:  [place] of Knowledge:Fair  Language: Poor  Akathisia:  No  Handed:  Right  AIMS (if indicated):  not done  Assets:  Desire for Improvement  ADL's:  Impaired  Cognition: WNL  Sleep:  Fair   Screenings: Mini-Mental     Office Visit from 06/12/2019 in [facility]  Total Score (max 30 points ) 24    PHQ2-9     [facility]  PHQ-2 Total Score 1  PHQ-9 Total Score 2      Assessment and Plan:   Patient's most likely diagnosis for my prescribed perception is that of dementia with depression.  She will continue taking Celexa.  At this time I would avoid most psychotropics in this individual.  She seems to need to be very fragile.  I do not think she is overtly distressed.  I think she is bothering concerned about the sensation she is having but I do not think is having a dramatic effect on individual who already is not functioning very much at all.  That is that it does not seem to get in the way of functioning.  I think she is very limited physically.  I will see this patient again after she sees her neurologist in December.  Patient is not suicidal.  He is not aggressive at  all.  Jerral Ralph, MD 10/6/20214:26 PM

## 2020-09-19 ENCOUNTER — Other Ambulatory Visit: Payer: Medicare Other | Admitting: Nurse Practitioner

## 2020-09-19 ENCOUNTER — Other Ambulatory Visit: Payer: Self-pay

## 2020-09-19 DIAGNOSIS — G3183 Dementia with Lewy bodies: Secondary | ICD-10-CM

## 2020-09-19 DIAGNOSIS — F0281 Dementia in other diseases classified elsewhere with behavioral disturbance: Secondary | ICD-10-CM

## 2020-09-19 DIAGNOSIS — Z515 Encounter for palliative care: Secondary | ICD-10-CM

## 2020-09-19 NOTE — Progress Notes (Signed)
[facility] Consult Note Telephone: [phone]  Fax: [phone]  PATIENT NAME: Molly [name] [id number] [phone] (home) [phone] (work) DOB: Dec 13, 1929 MRN: [id number]  PRIMARY CARE PROVIDER:    [name], MD,  [facility] [employer] [place] [address] [phone]  REFERRING PROVIDER:  Dr. Antony Contras (Neurology)  Dr. Antony Contras (Neurology).  Dr. Norma Fredrickson (Psych) Dr. Larae Grooms (Cardiology)       RESPONSIBLE PARTY:   Extended Emergency Contact Information Primary Emergency Contact: [place] Phone: [phone] Relation: Daughter Secondary Emergency Contact: [name] Address: [place], [place] [address] [name] of [place] Phone: [phone] Relation: Daughter  I met face to face with patient and daughter [name] in home.  ASSESSMENT AND RECOMMENDATIONS:   1. Advance Care Planning/Goals of Care:  Goals of Care: Maintain strength and function. Optimize mobility as best she can. Try to control symptoms of tactile hallucinations as able. Directives:DNR and MOST forms in home and on [facility] EMR. MOST form details include Comfort measures, determine use or limitations of infection when infection occurs.at time of infection, no to IVFs, no tube feeding tube.  2. Symptom Management: Patient underwent DAT scan at [facility] health and was diagnosed with Lewy body dementia. Patient continues to have tactile and visual hallucinations, where she feels things on her body. Patient now realize the sensations are not real but report them to be bothersome. Family report [facility]ist (Dr. Anna Genre) is sending prescription in for Nuplazid to help with some of the symptoms of lewy body dementia and parkinsons. Family unsure if insurance will pay for it , saying it may require a prior  authorization. Family said the cost may be as much as 6k if insurance does not pay for it. Family hopeful for the medication. Tactile disturbance,and hallucination at night time affect her sleep, patient takes Trazodone 39m for sleep, report no better result with use of Melatonin.  Daughter LMickel Baasreport patient's condition unchanged since last visit from Palliative care provider 4 months ago, report patient to be mostly stable, no ED visit or falls. Discussion on disease trajectory of dementia with LMickel Baas as it is progressive and terminal and likely to eventually lead to worsening symptoms like dysphagia, weight loss and immobility. Emotional and supportive care provided.  3. Follow up Palliative Care Visit: Palliative care will continue to follow for goals of care clarification and symptom management. Return in about 4 weeks or prn.  4. Family /Caregiver/Community Supports: (reviewed and updated from last visit note): Patient lives with oldest daughter LMickel Baasin a shared house with her sister ADanton Clapand her husband. Patient has hired part time care givers that assist with her care, needs more help with dressing, and brushing her teeth- those are the most tasking task for patient. Patient able to feed self. She is mostly continent of bowel and bladder, has incontinent episodes occasionally. Patient has 4 other children who are also involved in her care.   5. Cognitive / Functional decline: Patient awake and alert, coherent, good sense of humor. Ambulates with rolling walker. She can get up from lift chair independently, but needs help if sitting in regular chair. Has hospital bed. Daughter report patient carefull with ambulation. She enjoys bowling on Wii and watching the news. Patient verbalized happiness that her children live close by, used to like take  to take pictures.  I spent 48 minutes providing this consultation, time includes time spent with patient/family, chart review, and documentation  More  than 50% of the time in this consultation was spent coordinating communication.   HISTORY OF PRESENT ILLNESS:Harman Foglemanis [id number].[email or web address] several medical conditions including CAD, HLD, HTN, auditory/tactile hallucinations, Parkinson's like syndrome, and osteoporosis.Patient recently diagnosed with Lewy body dementia by [facility] health neurologist. This is a f/uPalliative Carevisit from5/05/2020.  CODE STATUS: DNR  PPS: 30%  HOSPICE ELIGIBILITY/DIAGNOSIS: TBD  PAST MEDICAL HISTORY:  Past Medical History:  Diagnosis Date  . Acid reflux   . Anemia   . Arthritis   . CAD (coronary artery disease)   . High cholesterol   . Hyperlipidemia   . Hypertension   . Osteoporosis   . Spinal stenosis   . Stroke (cerebrum) ([place]) 12/07/2017    SOCIAL HX:  Social History   Tobacco Use  . Smoking status: Never Smoker  . Smokeless tobacco: Never Used  Substance Use Topics  . Alcohol use: No    Alcohol/week: 0.0 standard drinks   FAMILY HX:  Family History  Problem Relation Age of Onset  . Anemia Mother   . Heart attack Brother     ALLERGIES:  Allergies  Allergen Reactions  . Buprenorphine Hcl Shortness Of Breath    Chest pain  . Codeine Nausea And Vomiting    ulcers  . Morphine And Related Shortness Of Breath    Chest pain, made me crazy  . Sulfacetamide Sodium Nausea And Vomiting  . Vicodin [Hydrocodone-Acetaminophen] Nausea And Vomiting  . Evista [Raloxifene] Other (See Comments) and Nausea And Vomiting    Upset stomach  . Hydromorphone Nausea And Vomiting    Sensitive  . Other Other (See Comments)    Narcotic-like medications - does NOT tolerate pain meds, causes unrelating volatile vomiting and drops in BP below 80/40 with dizziness and fainting  Tourniquet - Please place tourniquet over cloth - causes sever bruising.  Blood pressure cuff - please use manual blood pressure cuff - causes excoriating pain using machine and bruising     . Sulfur Nausea And  Vomiting    Vomited until bleeding occurred and developed an active stomach ulcer  . Tape Other (See Comments)    Rips skin  . Vistaril [Hydroxyzine Hcl] Other (See Comments)    Confusion, hallucinations     PERTINENT MEDICATIONS:  Outpatient Encounter Medications as of 09/19/2020  Medication Sig  . acetaminophen (TYLENOL) 500 MG tablet Take 500 mg by mouth every 8 (eight) hours as needed for mild pain.   . [place]-Glucosamine-Vit D (OSTEO BI-FLEX ONE PER DAY PO) Take 2,000 Units by mouth 2 (two) times daily.  . calcium-vitamin D (OSCAL WITH D) 500-200 MG-UNIT tablet Take 1 tablet by mouth 2 (two) times daily.  . carbidopa-levodopa (SINEMET) 25-100 MG tablet Take 0.5 tablets by mouth 4 (four) times daily.  . carbidopa-levodopa (SINEMET) 25-100 MG tablet Take 1.5 tablet and 1.0 tablet alternating four times daily.  . citalopram (CELEXA) 10 MG tablet Take 1 tablet (10 mg total) by mouth daily.  . clopidogrel (PLAVIX) 75 MG tablet TAKE 1 TABLET BY MOUTH EVERY DAY  . denosumab (PROLIA) 60 MG/ML SOLN injection Inject 60 mg into the skin every 6 (six) months. Administer in upper arm, thigh, or abdomen  . donepezil (ARICEPT) 5 MG tablet Take 1 tablet (5 mg total) by mouth at bedtime. "10 mgs split in half."  . ipratropium (ATROVENT) 0.03 % nasal spray Place 2  sprays into both nostrils every 12 (twelve) hours.  . metoprolol succinate (TOPROL-XL) 25 MG 24 hr tablet Take 0.5 tablets (12.5 mg total) by mouth daily with supper.  . nitroGLYCERIN (NITROSTAT) 0.4 MG SL tablet Place 1 tablet (0.4 mg total) under the tongue every 5 (five) minutes as needed for chest pain (x 3 pills).  . pantoprazole (PROTONIX) 40 MG tablet Take 1 tablet (40 mg total) by mouth daily. (Patient taking differently: Take 40 mg by mouth 2 (two) times daily. )  . polyethylene glycol powder (MIRALAX) 17 GM/SCOOP powder Take 1 Container by mouth once.  . Polysaccharide Iron Complex (NU-IRON PO) Take by mouth.  . Probiotic Product  (ALIGN) 4 MG CAPS Take 4 mg by mouth daily.   . rosuvastatin (CRESTOR) 20 MG tablet Take 20 mg by mouth at bedtime.  . traZODone (DESYREL) 50 MG tablet TAKE 1 TABLET BY MOUTH  DAILY AT BEDTIME   No facility-administered encounter medications on file as of 09/19/2020.    PHYSICAL EXAM / ROS:  Family report 3lbs weight gain, 158lbs,  19f". BMI 28.9kg/m2 General:  frail appearing, appropriate and cooperative, sitting in a recliner in NAD Cardiovascular: denied chest pain, mild edema- compression stockings on hose on  Pulmonary: no cough, no SOB, room air Abdomen: appetite fair, denied constipation, continent of bowel GU: denies dysuria, continent of urine MSK:  no joint and ROM abnormalities, ambulatory Skin: no rashes or wounds reported, skin thin and fragile Neurological: Weakness, but otherwise nonfocal  QJari Favre DNP, AGPCNP-BC

## 2020-09-27 ENCOUNTER — Ambulatory Visit: Payer: Medicare Other | Attending: Internal Medicine

## 2020-09-27 DIAGNOSIS — Z23 Encounter for immunization: Secondary | ICD-10-CM

## 2020-09-27 NOTE — Progress Notes (Signed)
   Covid-19 Vaccination Clinic  Name:  Molly [name]    MRN: [id number] DOB: [birth year]  09/27/2020  Molly [name] was observed post Covid-19 immunization for 15 minutes without incident. She was provided with Vaccine Information Sheet and instruction to access the V-Safe system.   Molly [name] was instructed to call 911 with any severe reactions post vaccine: Marland Kitchen Difficulty breathing  . Swelling of face and throat  . A fast heartbeat  . A bad rash all over body  . Dizziness and weakness

## 2020-10-15 ENCOUNTER — Ambulatory Visit: Payer: Medicare Other | Admitting: Neurology

## 2020-10-15 ENCOUNTER — Encounter: Payer: Self-pay | Admitting: Neurology

## 2020-10-15 ENCOUNTER — Other Ambulatory Visit: Payer: Self-pay

## 2020-10-15 VITALS — BP 128/68 | HR 69 | Ht 62.0 in | Wt 158.8 lb

## 2020-10-15 DIAGNOSIS — K117 Disturbances of salivary secretion: Secondary | ICD-10-CM

## 2020-10-15 NOTE — Patient Instructions (Signed)
I had a long discussion with patient and her daughter regarding her Lewy body dementia and persistent symptoms of delusion hallucination and excessive drooling.  I recommend a trial of Botox injections to help decrease drooling and will refer to Dr. Krista Blue for the same after insurance approval.  Continue Nuplazid for psychosis symptoms and since it has recently been started will take probably a couple of months to see full effect.  Continue Aricept and the current dose of 5 mg daily as she is unable to tolerate higher dose.  Continue Sinemet and the current dosage seems to be working well.  Continue on Plavix for stroke prevention and maintain aggressive risk factor control with strict control of hypertension blood pressure goal below 130/90 and lipids with LDL cholesterol goal below 70 mg percent.  Continue to use a walker for ambulation with 1 person assist and recommend home physical occupational therapy.  She will return for follow-up in the future as necessary.

## 2020-10-15 NOTE — Progress Notes (Signed)
[facility] [address] [place]. [place] [address] [phone]       OFFICE FOLLOW-UP NOTE  Molly. Truda [name] Date of Birth:  10/06/1929 Medical Record Number:  [id number]   HPI: Molly [name] is 86 year old Caucasian lady seen today for first office follow-up visit following hospital admission for stroke in December 2018. She is accompanied by her daughter. History is obtained from them as well as review of electronic medical records. I have personally reviewed imaging films. MollyHuldah Foglemanis a 86 y.o.femalewith PMH of coronary artery disease, hyperlipidemia, hypertension, atrial fibrillation on no anticoagulationpresenting to Molly emergency room with a last known normal of 9:15 AM on 12/07/2017 withleft-sided weakness and inability to stand.NIHSS 10.CT-scan of Molly brain -no acute change. Aspects 10. CT Angio of head and neck no large vessel occlusion but distal right M3 stenosis versus clot..CT perfusion no cortical infarct but tissue at risk 9 mL CT perfusion study of Molly brain shows a small area of penumbra in Molly right MCA territory with no core. IV TPA given Wed 12/07/17. Episode is likely aborted stroke with right M2 stenosis. MRI scan did not show definite right hemispheric stroke but clinically she had left hemiparalysis complete Molly stent with a small right brain stroke not seen on MRI likely because [name] got tPA. Vascular risk factors identified included atrial fibrillation, hypertension and hyperlipidemia as well as coronary artery disease and advanced age. Plavix was added to Molly [name]'s aspirin and statin dose was increased. She was transferred to inpatient rehabilitation from where she made gradual progress and is currently living at home.   02/22/18 visit Dr. Leonie Man: She is opting improvement in left-sided strength but still needs 1 person assist to get up and walk with a walker. She walks very limited. She fatigues very easily and gets tired.  She needs a person to walk close to her. She needs help with activities like dressing herself and feeding herself and cannot be left alone. Molly [name] is getting home physical and occupational therapy and has finished [name] therapy Molly daughters not satisfied and feels [name] still has trouble with eating and pocketing on Molly left side and fatigues easily when sitting. There are plans to consider outpatient TEE and loop recorder for this [name] but she has a history of epidural hematoma in Molly spine while she was on warfarin for DVT several years ago. She seems quite frail and his fall risk and hence I do not believe she is a good long-term candidate for anticoagulation since doing further workup with a TEE and loop recorder is not a good idea in my opinion. I have discussed this with Molly [name]'s daughter [name] who is in agreement with my plan  05/25/18 UPDATE: [name] is being seen today for 94-month follow-up and is accompanied by her daughter and granddaughter.  After previous visit, is recommended to continue PT/OT but as [name] was unable to afford outpatient visits it was recommended to continue home care but they were unable to obtain at that time due to orders not being placed.  Daughter stated that they decided just to wait until this follow-up visit to have an order placed.  [name]'s complaining of increasing memory loss over Molly past 1 to 2 weeks.  Denies history of any memory loss.  AFT 12, recall 3/3, clock drawing 4/4 and able to do serial additions quickly.  [name] stated that her memory waxes and wanes and at times it can be getting other times she is unable to remember certain  things.  Advised [name] that this is most likely due to age-related memory loss and encouraged to do mind exercises.  [name] also has complaining of generalized weakness and deconditioning. States her day consists of sitting in her chair and watching TV as it has been difficult for her to get around. Deconditioning  most likely and would benefit from continued therapies. Continues to take Plavix with mild bruising but no bleeding.  Continues to take Crestor without side effects myalgias.  Blood pressure today 141/69 but per daughter this is monitored at home and is normally lower.  She is using Rollator for ambulation but has used this for approximately 4 years now.  Denies new or worsening stroke/TIA symptoms. Update 01/22/2019 : She is seen today urgently upon request from family.  She has had new complaints of formed visual hallucinations for Molly last 4 to 5 weeks.  These occur mostly at night and very rarely during Molly day.  She sees formed figures like bugs, snakes, insects crawling out of her mouth, ear or face.  She has as a result had very poor sleep.  At times she falls asleep and wakes up feeling scared.  As result of not sleeping well she feels quite exhausted and tired during Molly day.  She gets quite upset when she ceases objects.  She has not noticed any decrease in vision, her memory and cognition have also remained stable without worsening.  She is also had some depth perception issues and feels that when she is walking Molly floor is uneven.  She feels at times that her bathroom floor is carpeted when it is tired.  She is able to watch television and recognize faces and able to read also quite well without difficulty.  She feels overall weak all over and is currently getting home physical and occupational therapy.  She does have 24-hour care at home.  She at times gets confused and disoriented but this does not stay long.  Family is concerned about these.  She has had chronic tinnitus in Molly ears and hearing loss but feels of late this seems to have gotten worse and she has constant sound of her swarm of bees in her ears as well.  She has not had any recent brain imaging or lab work done in Molly last month.  She has not had any significant medication changes either.  There has been no recent head injury or loss of  consciousness or significant headaches or other focal neurological symptoms  Update 06/12/2019 : She returns for follow-up today after last virtual follow-up visit on 04/03/2019.  [name] was started on Aricept 10 mg daily by me and appears to be tolerating it well in fact her visualized lesions appear much improved and she has not had them for Molly last 1 to 2 weeks.  However she has become weak all over and is unable to get up and walk even with physical therapist.  Physical therapy note states that when [name] is stood up she has severe gait initiation apraxia and is unable to step requires a lot of effort to do so.  There is also tremulousness of Molly leg when she stands.  There is also some intermittent tremors in Molly hands noted as well.  Molly [name] was seen by geriatric psychiatrist Dr. Casimiro Needle who initially tried aripiprazole but [name] did not tolerate it due to nausea vomiting and it was stopped.  He recommended trying Nuplazid for psychosis and hallucinations but [name]'s insurance did not cover it  and she could not afford Molly cost of  $550 a month and hence it was not tried.  I recommended Risperdal but [name] did not try that either.  She is complaining of difficulty sleeping at night now.  Previously she was sleeping much better on Seroquel but this has been discontinued as well.  Molly [name] is also complaining of some weakness in Molly right hand as well as right side.  She had an MRI scan of Molly brain done in February which had shown mild generalized atrophy but no infarct.  She does have some chronic right leg weakness from previous degenerative back disease.  Update 08/21/19 : She returns for follow-up after last visit 2 months ago.  She is accompanied by her daughter.  They have noticed definite benefit after starting Sinemet.  She was initially taking half a tablet 3 times daily but noticed early morning stiffness and gait initiation difficulties which appear to have improved after changing Molly  Sinemet dose to 4 times daily.  She is able to walk better now and able to assist with getting out of bed and transfers.  She walks with a walker with a close supervision.  She has had no falls or injuries.  She is also noted improvement in her tremors as well as feet freezing.  She is able to sleep better.  She still has occasional visual hallucinations but these are not as disabling and she feels she can realize that they are not real.  She was on Aricept 10 mg but Molly dose was reduced to 5 mg as it was noticed that made her quite anxious and she is not sleeping well which appears to have improved.  She does have 24-hour care at home.  She did undergo lab work after last visit on 06/12/2019 and complete metabolic panel labs and CBC were unremarkable.  I had ordered an MRI scan of Molly brain but for unclear reason that has not been done.  She has no new complaints today.  Update 01/29/2020: Molly [name] is a 86 year old female who is being seen today for follow-up accompanied by her daughter.  Continues on Sinemet 0.5 tabs 4 times daily with ongoing benefit in regards to ambulation, tremors and initial improvement of vivid dreams and hallucinations but daughter reports over Molly past 3 months she has been experiencing recurrence of prior vivid dreams and some hallucinations but not as severe.  These are not bothersome to [name].  It appears as though she perseverates on chronic nasal drip persistently feeling as though she needs to wipe her nose and there is something in there but family is unable to see with [name] is speaking of.  She does have appointment with geriatric psychiatry on 3/4.  She also continues on citalopram 10 mg daily, Aricept 2.5 mg daily and trazodone 50 mg nightly.  Continues to live with her daughter receiving 24-hour care at home and assistance fall ambulating with rolling walker.  Continues on Plavix and Crestor for secondary stroke prevention.  Blood pressure satisfactory 131/66.  No  further concerns at this time.   Update 10/15/2020 : She returns for follow-up after last visit 10-1/2 months ago.  She continues to have hallucinations and visual as well as occasional tactile.  She was seen by neuropsych Dr. Casimiro Needle as well as palliative care and referred to [facility]  By Dr Manson Allan where diagnosis of Lewy body dementia  versus PSP was   She underwent nuclear medicine DATscan scan on 09/11/2020 which showed symmetrical  reduced uptake in bilateral putamen.  She has been started on Nuplazid for parkinsonian psychosis but has taken it only for a week and so far has not been able to tell Molly difference.  She has managed to get a grant which will pay her co-pay of $1300 a month for 2 months and she is not sure what will happen after that.  She has reduced Molly dose of Aricept to 5 mg in Molly morning only as Molly evening dose made her very sleepy and lethargic.  She remains on Sinemet 1 tablet in Molly morning and half a tablet 4 times daily and is tolerating it well.  If she misses a tablets her legs do not move.  She is able to ambulate with a Rollator with 1 person assist.  She overall feels that she is getting weak all over.  She is also has an hallucination of food going up her nose even though she does not appear to be uncomfortable or cough or gag with it.  She lives at home with Molly daughter and has 24-hour supervision at home.  She has had no recurrent stroke or TIA symptoms.    ROS:   14 system review of systems is positive for weakness, memory loss, activity change, leg swelling, insomnia, vision hallucinations, walking difficulty,   and all other systems negative  PMH:  Past Medical History:  Diagnosis Date  . Acid reflux   . Anemia   . Arthritis   . CAD (coronary artery disease)   . High cholesterol   . Hyperlipidemia   . Hypertension   . Osteoporosis   . Spinal stenosis   . Stroke (cerebrum) ([facility]) 12/07/2017    Social History:  Social History   Socioeconomic History  .  Marital status: Widowed    Spouse name: Not on file  . Number of children: Not on file  . Years of education: Not on file  . Highest education level: Not on file  Occupational History  . Not on file  Tobacco Use  . Smoking status: Never Smoker  . Smokeless tobacco: Never Used  Vaping Use  . Vaping Use: Never used  Substance and Sexual Activity  . Alcohol use: No    Alcohol/week: 0.0 standard drinks  . Drug use: No  . Sexual activity: Not Currently  Other Topics Concern  . Not on file  Social History Narrative   Lives with daughter   Right Handed   Drinks 1 cup coffee/caffeine am   Social Determinants of Health   Financial Resource Strain:   . Difficulty of Paying Living Expenses: Not on file  Food Insecurity:   . Worried About Charity fundraiser in Molly Last Year: Not on file  . Ran Out of Food in Molly Last Year: Not on file  Transportation Needs:   . Lack of Transportation (Medical): Not on file  . Lack of Transportation (Non-Medical): Not on file  Physical Activity:   . Days of Exercise per Week: Not on file  . Minutes of Exercise per Session: Not on file  Stress:   . Feeling of Stress : Not on file  Social Connections:   . Frequency of Communication with Friends and Family: Not on file  . Frequency of Social Gatherings with Friends and Family: Not on file  . Attends Religious Services: Not on file  . Active Member of Clubs or Organizations: Not on file  . Attends Archivist Meetings: Not on file  . Marital  Status: Not on file  Intimate Partner Violence:   . Fear of Current or Ex-Partner: Not on file  . Emotionally Abused: Not on file  . Physically Abused: Not on file  . Sexually Abused: Not on file    Medications:   Current Outpatient Medications on File Prior to Visit  Medication Sig Dispense Refill  . acetaminophen (TYLENOL) 500 MG tablet Take 500 mg by mouth every 8 (eight) hours as needed for mild pain.     . Boswellia-Glucosamine-Vit D (OSTEO  BI-FLEX ONE PER DAY PO) Take 2,000 Units by mouth 2 (two) times daily.    . carbidopa-levodopa (SINEMET) 25-100 MG tablet Take 0.5 tablets by mouth 4 (four) times daily. ([name] taking differently: Take 1 tablet by mouth See admin instructions. 1 whole tablet in morning with breakfast 1/2 tablet at lunch 1/2 tablet at dinner 1/2 tablet at bedtime) 360 tablet 1  . citalopram (CELEXA) 10 MG tablet Take 1 tablet (10 mg total) by mouth daily. 90 tablet 2  . clopidogrel (PLAVIX) 75 MG tablet TAKE 1 TABLET BY MOUTH EVERY DAY 90 tablet 0  . denosumab (PROLIA) 60 MG/ML SOLN injection Inject 60 mg into Molly skin every 6 (six) months. Administer in upper arm, thigh, or abdomen    . donepezil (ARICEPT) 5 MG tablet Take 1 tablet (5 mg total) by mouth at bedtime. "10 mgs split in half." 90 tablet 3  . ipratropium (ATROVENT) 0.03 % nasal spray Place 2 sprays into both nostrils every 12 (twelve) hours.    . metoprolol succinate (TOPROL-XL) 25 MG 24 hr tablet Take 0.5 tablets (12.5 mg total) by mouth daily with supper. 30 tablet 0  . nitroGLYCERIN (NITROSTAT) 0.4 MG SL tablet Place 1 tablet (0.4 mg total) under Molly tongue every 5 (five) minutes as needed for chest pain (x 3 pills). 25 tablet 3  . pantoprazole (PROTONIX) 40 MG tablet Take 1 tablet (40 mg total) by mouth daily. ([name] taking differently: Take 40 mg by mouth 2 (two) times daily. ) 30 tablet 0  . Pimavanserin Tartrate (NUPLAZID) 34 MG CAPS Take by mouth.    . polyethylene glycol powder (MIRALAX) 17 GM/SCOOP powder Take 1 Container by mouth once.    . Polysaccharide Iron Complex (NU-IRON PO) Take by mouth.    . Probiotic Product (ALIGN) 4 MG CAPS Take 4 mg by mouth daily.     . rosuvastatin (CRESTOR) 20 MG tablet Take 20 mg by mouth at bedtime.    . traZODone (DESYREL) 50 MG tablet TAKE 1 TABLET BY MOUTH  DAILY AT BEDTIME 90 tablet 3  . calcium-vitamin D (OSCAL WITH D) 500-200 MG-UNIT tablet Take 1 tablet by mouth 2 (two) times daily. 120 tablet 0    . carbidopa-levodopa (SINEMET) 25-100 MG tablet Take 1.5 tablet and 1.0 tablet alternating four times daily. 450 tablet 1   No current facility-administered medications on file prior to visit.    Allergies:   Allergies  Allergen Reactions  . Buprenorphine Hcl Shortness Of Breath    Chest pain  . Codeine Nausea And Vomiting    ulcers  . Morphine And Related Shortness Of Breath    Chest pain, made me crazy  . Sulfacetamide Sodium Nausea And Vomiting  . Vicodin [Hydrocodone-Acetaminophen] Nausea And Vomiting  . Evista [Raloxifene] Other (See Comments) and Nausea And Vomiting    Upset stomach  . Hydromorphone Nausea And Vomiting    Sensitive  . Other Other (See Comments)    Narcotic-like medications - does  NOT tolerate pain meds, causes unrelating volatile vomiting and drops in BP below 80/40 with dizziness and fainting  Tourniquet - Please place tourniquet over cloth - causes sever bruising.  Blood pressure cuff - please use manual blood pressure cuff - causes excoriating pain using machine and bruising     . Sulfur Nausea And Vomiting    Vomited until bleeding occurred and developed an active stomach ulcer  . Tape Other (See Comments)    Rips skin  . Vistaril [Hydroxyzine Hcl] Other (See Comments)    Confusion, hallucinations    Physical Exam  Today's Vitals   10/15/20 1343  BP: 128/68  Pulse: 69  Weight: 72 kg  Height: 5\' 2"  (1.575 m)   Body mass index is 29.04 kg/m.  General: cachectic frail very pleasant elderly Caucasian lady, seated, in no evident distress Head: head normocephalic and atraumatic.  Head and neck deviated to Molly right Neck: supple with no carotid or supraclavicular bruits Cardiovascular: regular rate and rhythm, no murmurs Musculoskeletal: no deformity except marked kyphosis and slight scoliosis to Molly right Skin:  no rash/petichiae Vascular:  Normal pulses all extremities, 2+ pitting edema bilaterally  Neurologic Exam Mental Status: Awake  and fully alert. Oriented to place and time. Recent and remote memory diminished. Attention span, concentration and fund of knowledge diminished. Mood and affect appropriate.  Excessive drooling.  Soft voice with mild hypophonia .  Jaw jerk is brisk.  Palmomental reflexes are present bilaterally. Cranial Nerves: Pupils equal, briskly reactive to light. Extraocular movements full without nystagmus. Visual fields full to confrontation and able to read and write quite well. Hearing diminished bilaterally. Facial sensation intact.mild left lower facial asymmetry., tongue, palate moves normally and symmetrically.  Motor: Generalized weakness noted in all extremities (most likely related to deconditioning) but greater on Molly right with right foot drop and right grip and hip flexor weakness Sensory.: intact to touch ,pinprick .position and vibratory sensation.  Coordination: Slow but slightly impaired on Molly right compared to Molly left  Gait and Station: Stands from seated position with 2 person assist with slightly hunched position leaning towards right.  Gait assessment deferred as rolling walker not present during visit reflexes: 2+ and asymmetric and brisker on Molly right. Toes downgoing.    ASSESSMENT: 86 year old [name] with right MCA branch infarct in December 2018 of cryptogenic etiology with residual mild left hemiparesis.  [name] has had a subacute decline with vision hallucinations initially and now gait apraxia and gait difficulties possibly from degenerative neurological disorder like Parkinson's plus syndrome or Lewy body dementia.  She has gained slight great benefit with use of low-dose Sinemet, Aricept and trazodone.  New complaints of hypersalivation and excessive drooling which is bothersome.  PLAN: I had a long discussion with [name] and her daughter regarding her Lewy body dementia and persistent symptoms of delusion hallucination and excessive drooling.  I recommend a trial of Botox  injections to help decrease drooling and will refer to Dr. Krista Blue for Molly same after insurance approval.  Continue Nuplazid for psychosis symptoms and since it has recently been started will take probably a couple of months to see full effect.  Continue Aricept and Molly current dose of 5 mg daily as she is unable to tolerate higher dose.  Continue Sinemet and Molly current dosage seems to be working well.  Continue on Plavix for stroke prevention and maintain aggressive risk factor control with strict control of hypertension blood pressure goal below 130/90 and lipids with LDL cholesterol goal  below 70 mg percent.  Continue to use a walker for ambulation with 1 person assist and recommend home physical occupational therapy.  She will return for follow-up in Molly future as necessary.  Greater than 50% of time during this 30-minute visit was spent on counseling,explanation of diagnosis of peduncular hallucinations, parkinson`s plus, gait apraxia and stroke with ongoing benefit of low-dose Sinemet and Aricept, discussion regarding botox for hypersalivation,, reviewing risk factor management of HLD and HTN, planning of further management, discussion with [name] and family and coordination of care   Antony Contras, MD  [facility] [address] [place], [place] [address]  Phone [phone] Fax [phone] Note: This document was prepared with digital dictation and possible smart phrase technology. Any transcriptional errors that result from this process are unintentional.

## 2020-10-21 ENCOUNTER — Other Ambulatory Visit: Payer: Medicare Other | Admitting: Nurse Practitioner

## 2020-10-21 ENCOUNTER — Other Ambulatory Visit: Payer: Self-pay

## 2020-10-21 DIAGNOSIS — R442 Other hallucinations: Secondary | ICD-10-CM

## 2020-10-21 DIAGNOSIS — Z515 Encounter for palliative care: Secondary | ICD-10-CM

## 2020-10-21 NOTE — Progress Notes (Signed)
[place] Consult Note Telephone: [phone]  Fax: [phone]  PATIENT NAME: Molly [name] [id number] [phone] (home) [phone] (work) DOB: Mar 16, 1929 MRN: [id number]  PRIMARY CARE PROVIDER:    [name], MD,  [place] [address] [place] [address] [phone]  REFERRING PROVIDER:   [name], MD [place] [address] [place],  [place] [address] [phone]  RESPONSIBLE PARTY:   Extended Emergency Contact Information Primary Emergency Contact: [name] [place] of [place] Phone: [phone] Relation: Daughter Secondary Emergency Contact: [name] Address: [address]          [place], [place] [address] [name] of [place] Phone: [phone] Relation: Daughter  I met face to face with patient and daughter [name] in home.   ASSESSMENT AND RECOMMENDATIONS:   1. Advance Care Planning:  Goal of care: Goal of care is to maintain strength and function. Optimize mobility as best she can.Try to control symptoms of tactile hallucinations as able. Family wants patient to be comfortable and happy.  Directives: DNR and MOST forms in home and on [facility] EMR. MOST form details include Comfort measures, determine use or limitations of infection when infection occurs.at time of infection, no to IVFs, no tube feeding tube.  2. Symptom Management:  Hallucination, visual and tactile disturbances: Patient started on Nuplazid 72m daily two weeks ago to help with some of the symptoms of the Lewy body dementia and Parkinsonian psychosis. Family report the drug copay is about $1340, was able to obtain a grant that would pay for 2 months supply of the Nuplazid. Family has also reached out to [employer] help with paying for the drug. Family hopefull for ongoing funding to pay for the medication if it relieves  patient's symptoms. Patient report she has not seen any positive effect with the Nuplazid, saying she has not seen that it is working yet, she is waiting for all her symptoms to go away. Her daughter LMickel Baashowever believed there is some improvement in her tactile disturbances. Patient not complaining of feeling of runny nose today, this is an improvement from last visit a month ago. Family hopefull that medication will be effective as patient's symptoms are distressing to patient. Recommendation: Patient to continue the Nuplazid, per Neurology it will take a couple of months to see its full effect. Pain: Patient report a 5/10 to 6/10 pain in her neck during visit. She report pain is chronic and usually relieved with heating pad and PRN Tylenol. She has not taken any Tylenol today, she has no heating pad on.  Recommendation: Continue current plan of care. Consider starting Scheduled Tylenol 5035mBID or TID for pain.   3. Follow up Palliative Care Visit: Palliative care will continue to follow for goals of care clarification and symptom management. Return in about 4 weeks or prn.  4. Family /Caregiver/Community Supports: Patient lives with oldest daughter LaMickel Baasn a shared house with her sister AlDanton Clapnd her husband. [place], patient needs more help with dressing, and brushing her teeth- those are the most tasking task for patient. Patient able to feed self. She is mostly continent of bowel and bladder, has incontinent episodes occasionally. Patient has 4 other children who are also involved in her care.   5. Cognitive / Functional decline: Patient awake and alert, coherent, pleasant. Patient however appeared weaker today. Ambulates with  rolling walker. She can get up from lift chair independently, but needs help if sitting in regular chair. Has hospital bed. Daughter report patient carefull with ambulation. She enjoys bowling on Wii and watching the news.  Patient used to enjoy photography.   I spent 48 minutes providing this consultation, time includes time spent with patient and family, chart review, provider coordination, and documentation. More than 50% of the time in this consultation was spent coordinating communication.   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 Casimiro Needle, Neurologist Dr. Leonie Man, and [facility] Neurologist. This is a f/uPalliative Carevisit from10/07/2020.  CODE STATUS: DNR  PPS: 30%  HOSPICE ELIGIBILITY/DIAGNOSIS: TBD  PHYSICAL EXAM / ROS:   Current and past weights: 158.8lbs stable form last month, Ht 24f2", BMI 28.9kg/m2 General: ill and frail appearing, appropriate and cooperative. Sitting in a recliner chair in NAD Cardiovascular: denied chest pain,  edema , Ted hose on  Pulmonary: denied acute cough, denied SOB, sats 98% room air GI: no swallowing issues, appetite fair, denied constipation, continent of bowel GU: denies dysuria, incontinent of urine at times MSK:  no ROM abnormalities, ambulatory Skin: no rashes or wounds reported, skin thin and frail, ecchymotic area noted to right lower arm- no drainage Neurological: weakness, slight bent of neck to right  PAST MEDICAL HISTORY:  Past Medical History:  Diagnosis Date  . Acid reflux   . Anemia   . Arthritis   . CAD (coronary artery disease)   . High cholesterol   . Hyperlipidemia   . Hypertension   . Osteoporosis   . Spinal stenosis   . Stroke (cerebrum) ([facility] 12/07/2017    SOCIAL HX:  Social History   Tobacco Use  . Smoking status: Never Smoker  . Smokeless tobacco: Never Used  Substance Use Topics  .  Alcohol use: No    Alcohol/week: 0.0 standard drinks   FAMILY HX:  Family History  Problem Relation Age of Onset  . Anemia Mother   . Heart attack Brother    ALLERGIES:  Allergies  Allergen Reactions  . Buprenorphine Hcl Shortness Of Breath    Chest pain  . Codeine Nausea And Vomiting    ulcers  . Morphine And Related Shortness Of Breath    Chest pain, made me crazy  . Sulfacetamide Sodium Nausea And Vomiting  . Vicodin [Hydrocodone-Acetaminophen] Nausea And Vomiting  . Evista [Raloxifene] Other (See Comments) and Nausea And Vomiting    Upset stomach  . Hydromorphone Nausea And Vomiting    Sensitive  . Other Other (See Comments)    Narcotic-like medications - does NOT tolerate pain meds, causes unrelating volatile vomiting and drops in BP below 80/40 with dizziness and fainting  Tourniquet - Please place tourniquet over cloth - causes sever bruising.  Blood pressure cuff - please use manual blood pressure cuff - causes excoriating pain using machine and bruising     . Sulfur Nausea And Vomiting    Vomited until bleeding occurred and developed an active stomach ulcer  . Tape Other (See Comments)    Rips skin  . Vistaril [Hydroxyzine Hcl] Other (See Comments)    Confusion, hallucinations     PERTINENT MEDICATIONS:  Outpatient Encounter Medications as of 10/21/2020  Medication Sig  . acetaminophen (TYLENOL)  500 MG tablet Take 500 mg by mouth every 8 (eight) hours as needed for mild pain.   . Boswellia-Glucosamine-Vit D (OSTEO BI-FLEX ONE PER DAY PO) Take 2,000 Units by mouth 2 (two) times daily.  . calcium-vitamin D (OSCAL WITH D) 500-200 MG-UNIT tablet Take 1 tablet by mouth 2 (two) times daily.  . carbidopa-levodopa (SINEMET) 25-100 MG tablet Take 0.5 tablets by mouth 4 (four) times daily. (Patient taking differently: Take 1 tablet by mouth See admin instructions. 1 whole tablet in morning with breakfast 1/2 tablet at lunch 1/2 tablet at dinner 1/2 tablet at bedtime)  .  citalopram (CELEXA) 10 MG tablet Take 1 tablet (10 mg total) by mouth daily.  . clopidogrel (PLAVIX) 75 MG tablet TAKE 1 TABLET BY MOUTH EVERY DAY  . denosumab (PROLIA) 60 MG/ML SOLN injection Inject 60 mg into the skin every 6 (six) months. Administer in upper arm, thigh, or abdomen  . donepezil (ARICEPT) 5 MG tablet Take 1 tablet (5 mg total) by mouth at bedtime. "10 mgs split in half."  . ipratropium (ATROVENT) 0.03 % nasal spray Place 2 sprays into both nostrils every 12 (twelve) hours.  . metoprolol succinate (TOPROL-XL) 25 MG 24 hr tablet Take 0.5 tablets (12.5 mg total) by mouth daily with supper.  . nitroGLYCERIN (NITROSTAT) 0.4 MG SL tablet Place 1 tablet (0.4 mg total) under the tongue every 5 (five) minutes as needed for chest pain (x 3 pills).  . pantoprazole (PROTONIX) 40 MG tablet Take 1 tablet (40 mg total) by mouth daily. (Patient taking differently: Take 40 mg by mouth 2 (two) times daily. )  . Pimavanserin Tartrate (NUPLAZID) 34 MG CAPS Take by mouth.  . polyethylene glycol powder (MIRALAX) 17 GM/SCOOP powder Take 1 Container by mouth once.  . Polysaccharide Iron Complex (NU-IRON PO) Take by mouth.  . Probiotic Product (ALIGN) 4 MG CAPS Take 4 mg by mouth daily.   . rosuvastatin (CRESTOR) 20 MG tablet Take 20 mg by mouth at bedtime.  . traZODone (DESYREL) 50 MG tablet TAKE 1 TABLET BY MOUTH  DAILY AT BEDTIME   No facility-administered encounter medications on file as of 10/21/2020.      , DNP, AGPCNP-BC   

## 2020-10-23 ENCOUNTER — Telehealth: Payer: Self-pay | Admitting: Neurology

## 2020-10-23 ENCOUNTER — Encounter: Payer: Self-pay | Admitting: Adult Health

## 2020-10-23 MED ORDER — CARBIDOPA-LEVODOPA 25-100 MG PO TABS: 1.0000 | ORAL_TABLET | ORAL | 1 refills | Status: DC

## 2020-10-23 NOTE — Telephone Encounter (Signed)
I received charge sheet for Botox (X6468) 100U for K11.7 (drooling), as well as signed patient consent. I called [employer] to check PA requirements for codes [phone] and [phone]. I spoke with Tye [place] who states PA is not required for these codes. Patient can be B/B or use Sales promotion account executive. Reference #[id number].

## 2020-10-23 NOTE — Addendum Note (Signed)
Addended by: Brandon Melnick on: 10/23/2020 05:38 PM   Modules accepted: Orders

## 2020-10-27 ENCOUNTER — Other Ambulatory Visit: Payer: Self-pay | Admitting: *Deleted

## 2020-10-27 MED ORDER — CARBIDOPA-LEVODOPA 25-100 MG PO TABS
1.0000 | ORAL_TABLET | ORAL | 1 refills | Status: DC
Start: 2020-10-27 — End: 2020-12-16

## 2020-10-27 NOTE — Telephone Encounter (Signed)
Prescription for sinemet printed as too many characters.  No provider able to sign on Thursday 10-23-20.  [facility] no answer 9-10 min on hold for verbal order.  I did printed prescription this am for Janett Billow NP to sign and faxed to [place] [phone] with confirmation received.

## 2020-11-04 NOTE — Telephone Encounter (Signed)
Submitted PA for Botox through CMM. Patient will use [employer].

## 2020-11-11 NOTE — Telephone Encounter (Signed)
I called the patient and LVM asking her to return my call to schedule her first injection.

## 2020-11-11 NOTE — Telephone Encounter (Addendum)
After checking CMM portal, Botox was denied due to lack of information. I called the PA department at [facility]. Spoke with representative who was able to get PA approved. [place] #[id number]. I was then transferred to the pharmacy to give a verbal prescription. I spoke with Gerald Stabs and he states that he will begin working on this.

## 2020-11-24 NOTE — Telephone Encounter (Signed)
I filled out appeal form & gave to MD to sign.

## 2020-11-24 NOTE — Telephone Encounter (Signed)
I received a denial letter from [facility] after receiving an approval. I called [employer] PA dept [phone]) and spoke with Lexine Baton to clarify. She states that the request was denied due to insufficient information to support medical necessity. She states we can appeal using the form received with the denial letter.

## 2020-11-24 NOTE — Telephone Encounter (Signed)
Received signed appeal form. I faxed appeal form with clinical notes to Part D [place] Dept.

## 2020-11-25 ENCOUNTER — Other Ambulatory Visit: Payer: Self-pay

## 2020-11-25 ENCOUNTER — Other Ambulatory Visit: Payer: Medicare Other | Admitting: Nurse Practitioner

## 2020-11-25 DIAGNOSIS — Z515 Encounter for palliative care: Secondary | ICD-10-CM

## 2020-11-25 DIAGNOSIS — I952 Hypotension due to drugs: Secondary | ICD-10-CM

## 2020-11-25 NOTE — Progress Notes (Signed)
[facility] Consult Note Telephone: [phone]  Fax: [phone]  PATIENT NAME: Molly [name] [place] [id number] [phone] (home) [phone] (work) DOB: [birth year] MRN: [id number]  PRIMARY CARE PROVIDER:    [name], MD,  [place] [address] [place] [address] [phone]  REFERRING PROVIDER:   [name], MD [place],  [place] [address] [phone]  RESPONSIBLE PARTY:   Extended Emergency Contact Information Primary Emergency Contact: [name] States of [place] Phone: [phone] Relation: Daughter Secondary Emergency Contact: [name] Address: [place], [place] [address] [name] of [place] Phone: [phone] Relation: Daughter  I met face to face with patient and family in home.   ASSESSMENT AND RECOMMENDATIONS:   Advance Care Planning:  Goal of care: Goal of care is to maintain strength and function. Optimize mobility as best she can. Family wants patient to be comfortable and happy Directives: DNRandMOST formsin home and [employer] EMR.MOSTform details includecomfort measures, determine useor limitations of antibiotics when infection occurs, no IVFs, no feedingtube.  Symptom Management:  Weakness: Patient continues to experience increased weakness since starting Nuplazid. Patient is on the third month of the medication. Daughter [name] who is her main caregiver believed the medication made patient's symptoms about 75-80% better, reporting patient's dreams are not as scary any more. We discussed risk vs benefit of continuing the medication in the context of ongoing weakness and complaint of nausea. [name] expressed optimism for the medication, saying "it took 2 years to get to this point of getting the medication, we are willing to give it some time". Validation  provided. Hypotension: [name] report frequent episode of low BP. Patient on Metoprolol succinate 12.9m BID, dose decreased to once a day 3 weeks ago. Review of BP log today showed SBP range between 157-75 in the last 3 weeks, in the last week SBP has been between 131-75, with SBP mostly in the 90s, patient has order to hold Med if SBP is less than 90. Patient report episodes of feeling of blacking out, family report increased weakness. Patient however denied chest pain or SOB.  Recommendation: Consider stopping Metopropl due to low BPs. As risk of falls related to hypotension out weighs the benefit at this time. LMickel Baasverbalized intent of discussing this with patient's PCP. Nausea: Patient report increased episodes of nausea, which she reports occurs intermetently through out the day and affects her oral intake, she report vomiting once. Family has been administering over the counter Dramanine for nausea with success. Patient report increased Rinorhea especially when sitting up, lying down makes it better. Rhinorhea is not new, patient has tried Alegera and Zyrtec without significant improvement in symptoms. Patient also report increased salivation which she report feels thick. Family report no change in fluid intake, report hydration as the same. Family report patient report occasional difficulty swallowing sticky foods like mashed potatoes. No evidence of angio edema noted on exam today. Recommendation: May consider starting patient on Scopolamine patch to help with nausea and the increased salivation. Daughter like the idea of a patch to reduce pill burden. Daughter made aware that medication is an anticholinergic and may cause dry mouth and urinary retention. Will discuss med with Neurologist and her PCP, may need to review the risk vs benefit in this situation.  We discussed patient's current illness and what it means in the larger context  of patient's on-going co-morbidities. We discussed chronic disease  trajectory, and made [name] aware that patient maybe nearing end of life as evidenced by increased frailty. [name] verbalized understanding, report family planning to have a good christmas celebration with patient. emotional support provided.  Follow up Palliative Care Visit: Palliative care will continue to follow for goals of care clarification and symptom management. Return in about 4-6 weeks or prn.  Family /Caregiver/Community Supports: Patient lives with oldest daughter [name] who is her main caregiver. Patient has 4 other children who are also very involved in her care.Patient has a paid caregiver from 8am to 5pm Monday to Friday.   Cognitive / Functional decline: Family report patient requiring more assistance with her ADLs with episodes of incontinence occurring more often. Patient report episode of feeling like "black out", however no report of syncope or falls. Patient still ambulate with a Rolator walker with standby assist.   I spent 60 minutes providing this consultation. More than 50% of the time in this consultation was spent counseling and coordinating communication.   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 She is followed by Neuropsych Dr. Casimiro Needle, Neurologist Dr. Leonie Man, and [facility] Neurologist. Patient was recently started on Nuplazid for Hallucination, visual and tactile disturbances related to Lewy body dementia and Parkinsonian psychosis. This is a f/uPalliative Carevisit from11/08/2020.  CODE STATUS: DNR  PPS: 30%  HOSPICE ELIGIBILITY/DIAGNOSIS: TBD  PHYSICAL EXAM / ROS:   Current and past weights: 158lbs stable from last  visit a month ago. Ht 57f2", BMI 28.9kg/m2 General: chronically ill and frail appearing, appropriate and cooperative. Sitting in a recliner chair in NAD Cardiovascular: denied chest pain, trace edema to BLE Pulmonary: denied acute cough, denied SOB GI:  appetite fair, denied constipation, mostly continent of bowel GU: denies dysuria, incontinent of urine at times MSK:  no ROM abnormalities, ambulatory Skin: no rashes or wounds reported, skin thin and fragile Neurological: weakness, other wise non-focal Psych: non anxious appearing  PAST MEDICAL HISTORY:  Past Medical History:  Diagnosis Date   Acid reflux    Anemia    Arthritis    CAD (coronary artery disease)    High cholesterol    Hyperlipidemia    Hypertension    Osteoporosis    Spinal stenosis    St[place] ([place]rebrum) ([place] 12[place]07/2017    SOCIAL HX:  Social History   Tobacco Use   Smoking status: Never Smoker   Smokeless tobacco: Never Used  Substance Use Topics   Alcohol use: No    Alcohol/week: 0.0 standard drinks   FAMILY HX:  Family History  Problem Relation Age of Onset   Anemia Mother    Heart attack Brother     ALLERGIES:  Allergies  Allergen Reactions   Buprenorphine Hcl Shortness Of Breath    Chest pain   Codeine Nausea And Vomiting    ulcers   Morphine And Related Shortness Of Breath    Chest pain, made me crazy   Sulfacetamide Sodium Nausea And Vomiting   Vicodin [Hydrocodone-Acetaminophen] Nausea And Vomiting   Evista [Raloxifene] Other (See Comments) and Nausea And Vomiting    Upset stomach   Hydromorphone Nausea And Vomiting    Sensitive   Other Other (See Comments)    Narcotic-like medications - does NOT tolerate pain meds, causes unrelating volatile  vomiting and drops in BP below 80/40 with dizziness and fainting  Tourniquet - Please place tourniquet over cloth - causes sever bruising.  Blood pressure cuff - please use manual blood pressure cuff - causes excoriating  pain using machine and bruising      Sulfur Nausea And Vomiting    Vomited until bleeding occurred and developed an active stomach ulcer   Tape Other (See Comments)    Rips skin   Vistaril [Hydroxyzine Hcl] Other (See Comments)    Confusion, hallucinations     PERTINENT MEDICATIONS:  Outpatient Encounter Medications as of 11/25/2020  Medication Sig   acetaminophen (TYLENOL) 500 MG tablet Take 500 mg by mouth every 8 (eight) hours as needed for mild pain.    Boswellia-Glucosamine-Vit D (OSTEO BI-FLEX ONE PER DAY PO) Take 2,000 Units by mouth 2 (two) times daily.   calcium-vitamin D (OSCAL WITH D) 500-200 MG-UNIT tablet Take 1 tablet by mouth 2 (two) times daily.   carbidopa-levodopa (SINEMET) 25-100 MG tablet Take 1 tablet by mouth See admin instructions. 1 whole tablet in morning with breakfast 1/2 tablet at lunch 1/2 tablet at dinner 1/2 tablet at bedtime   citalopram (CELEXA) 10 MG tablet Take 1 tablet (10 mg total) by mouth daily.   clopidogrel (PLAVIX) 75 MG tablet TAKE 1 TABLET BY MOUTH EVERY DAY   denosumab (PROLIA) 60 MG/ML SOLN injection Inject 60 mg into the skin every 6 (six) months. Administer in upper arm, thigh, or abdomen   donepezil (ARICEPT) 5 MG tablet Take 1 tablet (5 mg total) by mouth at bedtime. "10 mgs split in half."   ipratropium (ATROVENT) 0.03 % nasal spray Place 2 sprays into both nostrils every 12 (twelve) hours.   metoprolol succinate (TOPROL-XL) 25 MG 24 hr tablet Take 0.5 tablets (12.5 mg total) by mouth daily with supper.   nitroGLYCERIN (NITROSTAT) 0.4 MG SL tablet Place 1 tablet (0.4 mg total) under the tongue every 5 (five) minutes as needed for chest pain (x 3 pills).   pantoprazole (PROTONIX) 40 MG tablet Take 1 tablet (40 mg total) by mouth daily. (Patient taking differently: Take 40 mg by mouth 2 (two) times daily. )   Pimavanserin Tartrate (NUPLAZID) 34 MG CAPS Take by mouth.   polyethylene glycol powder (MIRALAX) 17 GM/SCOOP powder  Take 1 Container by mouth once.   Polysaccharide Iron Complex (NU-IRON PO) Take by mouth.   Probiotic Product (ALIGN) 4 MG CAPS Take 4 mg by mouth daily.    rosuvastatin (CRESTOR) 20 MG tablet Take 20 mg by mouth at bedtime.   traZODone (DESYREL) 50 MG tablet TAKE 1 TABLET BY MOUTH  DAILY AT BEDTIME   No facility-administered encounter medications on file as of 11/25/2020.     Jari Favre, DNP, AGPCNP-BC

## 2020-12-03 ENCOUNTER — Telehealth: Payer: Self-pay | Admitting: Neurology

## 2020-12-03 ENCOUNTER — Other Ambulatory Visit: Payer: Self-pay

## 2020-12-03 ENCOUNTER — Telehealth (INDEPENDENT_AMBULATORY_CARE_PROVIDER_SITE_OTHER): Payer: Medicare Other | Admitting: Psychiatry

## 2020-12-03 DIAGNOSIS — F01518 Vascular dementia, unspecified severity, with other behavioral disturbance: Secondary | ICD-10-CM

## 2020-12-03 DIAGNOSIS — F0151 Vascular dementia with behavioral disturbance: Secondary | ICD-10-CM | POA: Diagnosis not present

## 2020-12-03 MED ORDER — NUPLAZID 10 MG PO TABS: 10.0000 mg | ORAL_TABLET | Freq: Every day | ORAL | 1 refills | Status: AC

## 2020-12-03 MED ORDER — CITALOPRAM HYDROBROMIDE 10 MG PO TABS: 10.0000 mg | ORAL_TABLET | Freq: Every day | ORAL | 2 refills | Status: AC

## 2020-12-03 MED ORDER — ONDANSETRON HCL 4 MG PO TABS: 4.0000 mg | ORAL_TABLET | Freq: Three times a day (TID) | ORAL | 3 refills | Status: AC | PRN

## 2020-12-03 NOTE — Progress Notes (Signed)
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 It is not easy last week. It turns out that her neurologist did get her on Nuplazid 32 mg. [name] says his had only a small benefit but unfortunately comes with a lot of nausea. Response to the primary care Dr. Was to give her an anticholinergic agent as a patch. Unfortunately that Counteracted any benefits of Aricept. I don't he really have much effect anyway on her nausea. Her neurologist been reduce her Nuplazid From 32 mg down to 10 mg. She just maybe adjustment in the last day or 2. The patient is not violent or aggressive. She shows no gross signs being depressed in terms of trying are making detrimental statements. He never acted suicidal. She does show hallucinations mainly do so visual hallucinations. Her neurologist was diagnosed her with probably Lewy body dementia. The patient is actually eating fairly well. She sleeps very well from about 8 at night for the morning. As a regular routine probably watching television and actually playing bowling by the  [employer]. She plays with her daughters. The patient is a very supportive family. The hallucinations are probably the biggest concern according to [name]. Hopefully without the nausea by removing the patch was anticholinergic her nausea be better on the lower dose of Nuplazid seems reasonable. Patient continue taking Celexa. Associated Signs/Symptoms: Depression Symptoms:   (Hypo) Manic Symptoms:   Anxiety Symptoms:   Psychotic Symptoms:  Hallucinations: Visual PTSD Symptoms:   Past Psychiatric History: Seroquel 100 mg  Previous Psychotropic Medications:   Substance Abuse History in the last 12 months:    Consequences of Substance Abuse:   Past Medical  History:  Past Medical History:  Diagnosis Date  . Acid reflux   . Anemia   . Arthritis   . CAD (coronary artery disease)   . High cholesterol   . Hyperlipidemia   . Hypertension   . Osteoporosis   . Spinal stenosis   . Stroke (cerebrum) ([place]) 12/07/2017    Past Surgical History:  Procedure Laterality Date  . ABDOMINAL HYSTERECTOMY    . APPENDECTOMY    . BACK SURGERY     Rod and Pins Placed   . CARDIAC CATHETERIZATION N/A 11/17/2015   Procedure: Left Heart Cath and Coronary Angiography;  Surgeon: Jettie Booze, MD;  Location: [facility];  Service: Cardiovascular;  Laterality: N/A;  . CAROTID STENT    . UPPER GI ENDOSCOPY      Family Psychiatric History:   Family History:  Family History  Problem Relation Age of Onset  . Anemia Mother   . Heart attack Brother     Social History:   Social History   Socioeconomic History  . Marital status: Widowed    Spouse name: Not on file  . Number of children: Not on file  . Years of education: Not on file  . Highest education level: Not on file  Occupational History  . Not on file  Tobacco Use  . Smoking status: Never Smoker  . Smokeless tobacco: Never Used  Vaping Use  . Vaping Use: Never used  Substance and Sexual Activity  . Alcohol use: No    Alcohol/week: 0.0 standard drinks  . Drug use: No  . Sexual activity: Not Currently  Other Topics Concern  .  Not on file  Social History Narrative   Lives with daughter   Right Handed   Drinks 1 cup coffee/caffeine am   Social Determinants of Health   Financial Resource Strain: Not on file  Food Insecurity: Not on file  Transportation Needs: Not on file  Physical Activity: Not on file  Stress: Not on file  Social Connections: Not on file    Additional Social History:   Allergies:   Allergies  Allergen Reactions  . Buprenorphine Hcl Shortness Of Breath    Chest pain  . Codeine Nausea And Vomiting    ulcers  . Morphine And Related Shortness Of  Breath    Chest pain, made me crazy  . Sulfacetamide Sodium Nausea And Vomiting  . Vicodin [Hydrocodone-Acetaminophen] Nausea And Vomiting  . Evista [Raloxifene] Other (See Comments) and Nausea And Vomiting    Upset stomach  . Hydromorphone Nausea And Vomiting    Sensitive  . Other Other (See Comments)    Narcotic-like medications - does NOT tolerate pain meds, causes unrelating volatile vomiting and drops in BP below 80/40 with dizziness and fainting  Tourniquet - Please place tourniquet over cloth - causes sever bruising.  Blood pressure cuff - please use manual blood pressure cuff - causes excoriating pain using machine and bruising     . Sulfur Nausea And Vomiting    Vomited until bleeding occurred and developed an active stomach ulcer  . Tape Other (See Comments)    Rips skin  . Vistaril [Hydroxyzine Hcl] Other (See Comments)    Confusion, hallucinations    Metabolic Disorder Labs: Lab Results  Component Value Date   HGBA1C 5.7 (H) 12/08/2017   MPG 116.89 12/08/2017   No results found for: PROLACTIN Lab Results  Component Value Date   CHOL 133 12/08/2017   TRIG 98 12/08/2017   HDL 63 12/08/2017   CHOLHDL 2.1 12/08/2017   VLDL 20 12/08/2017   LDLCALC 50 12/08/2017   LDLCALC 87 11/18/2014   Lab Results  Component Value Date   TSH 3.010 01/23/2019    Therapeutic Level Labs: No results found for: LITHIUM No results found for: CBMZ No results found for: VALPROATE  Current Medications: Current Outpatient Medications  Medication Sig Dispense Refill  . acetaminophen (TYLENOL) 500 MG tablet Take 500 mg by mouth every 8 (eight) hours as needed for mild pain.     . [place]-Glucosamine-Vit D (OSTEO BI-FLEX ONE PER DAY PO) Take 2,000 Units by mouth 2 (two) times daily.    . calcium-vitamin D (OSCAL WITH D) 500-200 MG-UNIT tablet Take 1 tablet by mouth 2 (two) times daily. 120 tablet 0  . carbidopa-levodopa (SINEMET) 25-100 MG tablet Take 1 tablet by mouth See admin  instructions. 1 whole tablet in morning with breakfast 1/2 tablet at lunch 1/2 tablet at dinner 1/2 tablet at bedtime 360 tablet 1  . citalopram (CELEXA) 10 MG tablet Take 1 tablet (10 mg total) by mouth daily. 90 tablet 2  . clopidogrel (PLAVIX) 75 MG tablet TAKE 1 TABLET BY MOUTH EVERY DAY 90 tablet 0  . denosumab (PROLIA) 60 MG/ML SOLN injection Inject 60 mg into the skin every 6 (six) months. Administer in upper arm, thigh, or abdomen    . ipratropium (ATROVENT) 0.03 % nasal spray Place 2 sprays into both nostrils every 12 (twelve) hours.    . nitroGLYCERIN (NITROSTAT) 0.4 MG SL tablet Place 1 tablet (0.4 mg total) under the tongue every 5 (five) minutes as needed for chest pain (x 3  pills). 25 tablet 3  . ondansetron (ZOFRAN) 4 MG tablet Take 1 tablet (4 mg total) by mouth every 8 (eight) hours as needed for nausea or vomiting. 20 tablet 3  . pantoprazole (PROTONIX) 40 MG tablet Take 1 tablet (40 mg total) by mouth daily. (Patient taking differently: Take 40 mg by mouth 2 (two) times daily. ) 30 tablet 0  . Pimavanserin Tartrate (NUPLAZID) 10 MG TABS Take 10 mg by mouth at bedtime. 90 tablet 1  . polyethylene glycol powder (MIRALAX) 17 GM/SCOOP powder Take 1 Container by mouth once.    . Polysaccharide Iron Complex (NU-IRON PO) Take by mouth.    . Probiotic Product (ALIGN) 4 MG CAPS Take 4 mg by mouth daily.     . rosuvastatin (CRESTOR) 20 MG tablet Take 20 mg by mouth at bedtime.    . traZODone (DESYREL) 50 MG tablet TAKE 1 TABLET BY MOUTH  DAILY AT BEDTIME 90 tablet 3   No current facility-administered medications for this visit.    Musculoskeletal: Strength & Muscle Tone: decreased Gait & Station: unable to stand Patient leans: N/A  Psychiatric Specialty Exam: ROS  There were no vitals taken for this visit.There is no height or weight on file to calculate BMI.  General Appearance: Casual  Eye Contact:  Good  Speech:  Normal Rate  Volume:  Normal  Mood:  Euthymic  Affect:   Appropriate  Thought Process:  Goal Directed  Orientation:  NA  Thought Content:  Hallucinations: Visual  Suicidal Thoughts:  No  Homicidal Thoughts:  No  Memory:    Judgement:  Good  Insight:  Fair  Psychomotor Activity:  Decreased  Concentration:    Recall:  [place] of Knowledge:Fair  Language: Poor  Akathisia:  No  Handed:  Right  AIMS (if indicated):  not done  Assets:  Desire for Improvement  ADL's:  Impaired  Cognition: WNL  Sleep:  Fair   Screenings: Mini-Mental   Flowsheet Row Office Visit from 06/12/2019 in [facility]  Total Score (max 30 points ) 24    PHQ2-9   [place] [facility]  [facility] Total Score 1  PHQ-9 Total Score 2      Assessment and Plan:  This patient his diagnosis is that of vascular dementia with depression. She also has associated with hallucinations as well. Continue getting from her neurologist the lower dose of Nuplazid. From a she'll continue taking Celexa low dose 10 mg. I issues are usual for her Sinemet. Apparently they'll hallucinations began after her stroke and before she was placed on Sinemet. Efforts unlikely Sinemet had a negative effect. According to the family system it's helped her a lot her Parkinson's symptoms. This patient will be seen again in 4 months. Jerral Ralph, MD 12/22/20213:22 PM

## 2020-12-03 NOTE — Telephone Encounter (Signed)
I called and talk with the daughter. The patient has had problems with increased confusion of the last several days, not sleeping, she has had diarrhea, increased general weakness and confusion. She is hallucinating significantly, they took her off of the Nuplazid due to severe drowsiness.  As it turns out, the patient apparently 3 to 4 days ago was placed on a Transderm-Scop patch for nausea.   They are to stop the Transderm-Scop patch, we will reduce the Nuplazid dose to 10 mg at night. The patient will be given Zofran if needed for nausea. Due to persistent runny nose and some diarrhea, we will stop the Aricept.

## 2020-12-08 NOTE — Telephone Encounter (Signed)
Received letter from [facility] advising they have overturned the initial denial. Botox is now approved. PA #[id number] (11/04/20- 12/12/21).

## 2020-12-16 ENCOUNTER — Other Ambulatory Visit: Payer: Self-pay | Admitting: Neurology

## 2020-12-16 ENCOUNTER — Other Ambulatory Visit (HOSPITAL_COMMUNITY): Payer: Self-pay | Admitting: Psychiatry

## 2020-12-29 NOTE — Telephone Encounter (Signed)
I called the [employer] PA line to make sure they have the overturned appeal on file. I spoke with Molly [name], who states the approval is now on file and medication can be filled with [facility]. Molly [name] transferred me to Molly [name] to discuss delivery. Molly [name] states that the patient will be responsible for $290.00. I advised Molly [name] that I would like to reach out to patient's daughter, Molly [name], before proceeding.  I called Molly [name] to discuss. She states that patient was placed in hospice last week and is unsure if Botox would be covered by insurance or beneficial to patient at this time. I informed Molly [name] about the co-pay for the Botox with the specialty pharmacy.  Molly [name] states she would like to discuss it with her sister, patient's other daughter, before proceeding.

## 2021-01-14 ENCOUNTER — Ambulatory Visit: Payer: Medicare Other | Admitting: Interventional Cardiology

## 2021-02-16 ENCOUNTER — Ambulatory Visit: Payer: Medicare Other | Admitting: Interventional Cardiology

## 2021-02-19 DIAGNOSIS — I6930 Unspecified sequelae of cerebral infarction: Secondary | ICD-10-CM | POA: Diagnosis not present

## 2021-02-19 DIAGNOSIS — R413 Other amnesia: Secondary | ICD-10-CM | POA: Diagnosis not present

## 2021-02-19 DIAGNOSIS — I25118 Atherosclerotic heart disease of native coronary artery with other forms of angina pectoris: Secondary | ICD-10-CM | POA: Diagnosis not present

## 2021-02-19 DIAGNOSIS — M4800 Spinal stenosis, site unspecified: Secondary | ICD-10-CM | POA: Diagnosis not present

## 2021-02-19 DIAGNOSIS — D649 Anemia, unspecified: Secondary | ICD-10-CM | POA: Diagnosis not present

## 2021-02-19 DIAGNOSIS — I129 Hypertensive chronic kidney disease with stage 1 through stage 4 chronic kidney disease, or unspecified chronic kidney disease: Secondary | ICD-10-CM | POA: Diagnosis not present

## 2021-02-19 DIAGNOSIS — M810 Age-related osteoporosis without current pathological fracture: Secondary | ICD-10-CM | POA: Diagnosis not present

## 2021-02-19 DIAGNOSIS — I5032 Chronic diastolic (congestive) heart failure: Secondary | ICD-10-CM | POA: Diagnosis not present

## 2021-02-19 DIAGNOSIS — I119 Hypertensive heart disease without heart failure: Secondary | ICD-10-CM | POA: Diagnosis not present

## 2021-03-09 ENCOUNTER — Other Ambulatory Visit: Payer: Self-pay | Admitting: Neurology

## 2021-03-24 ENCOUNTER — Telehealth (HOSPITAL_COMMUNITY): Payer: Self-pay | Admitting: *Deleted

## 2021-03-24 NOTE — Telephone Encounter (Signed)
Pt's daughter, [name], called explaining that she has to cx pt appointment that was scheduled for 03/25/21 due to pt being in Hospice care. Daughter states that [facility] will not pay for visits to this office as she is now in Hospice care. FYI

## 2021-03-25 ENCOUNTER — Telehealth (HOSPITAL_COMMUNITY): Payer: Medicare Other | Admitting: Psychiatry

## 2021-05-22 ENCOUNTER — Ambulatory Visit: Payer: Medicare Other | Admitting: Interventional Cardiology

## 2021-09-16 DIAGNOSIS — R0902 Hypoxemia: Secondary | ICD-10-CM | POA: Diagnosis not present

## 2021-09-16 DIAGNOSIS — R55 Syncope and collapse: Secondary | ICD-10-CM | POA: Diagnosis not present

## 2021-09-16 DIAGNOSIS — R110 Nausea: Secondary | ICD-10-CM | POA: Diagnosis not present

## 2021-09-16 DIAGNOSIS — I447 Left bundle-branch block, unspecified: Secondary | ICD-10-CM | POA: Diagnosis not present

## 2021-09-16 DIAGNOSIS — I959 Hypotension, unspecified: Secondary | ICD-10-CM | POA: Diagnosis not present

## 2022-02-10 DEATH — deceased
